# Patient Record
Sex: Female | Born: 1980 | Race: Black or African American | Hispanic: No | Marital: Single | Smoking: Current every day smoker
Health system: Southern US, Community
[De-identification: ages and names within clinical notes are randomized; demographics above are authoritative.]

## PROBLEM LIST (undated history)

## (undated) ENCOUNTER — Inpatient Hospital Stay (HOSPITAL_COMMUNITY): Payer: Self-pay

## (undated) ENCOUNTER — Ambulatory Visit (HOSPITAL_COMMUNITY): Source: Home / Self Care

## (undated) DIAGNOSIS — B9689 Other specified bacterial agents as the cause of diseases classified elsewhere: Secondary | ICD-10-CM

## (undated) DIAGNOSIS — N760 Acute vaginitis: Secondary | ICD-10-CM

## (undated) DIAGNOSIS — A549 Gonococcal infection, unspecified: Secondary | ICD-10-CM

## (undated) DIAGNOSIS — Z22322 Carrier or suspected carrier of Methicillin resistant Staphylococcus aureus: Secondary | ICD-10-CM

## (undated) DIAGNOSIS — N9489 Other specified conditions associated with female genital organs and menstrual cycle: Secondary | ICD-10-CM

## (undated) HISTORY — PX: WISDOM TOOTH EXTRACTION: SHX21

## (undated) HISTORY — DX: Carrier or suspected carrier of methicillin resistant Staphylococcus aureus: Z22.322

## (undated) HISTORY — PX: NO PAST SURGERIES: SHX2092

---
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
## 2002-01-13 ENCOUNTER — Observation Stay (HOSPITAL_COMMUNITY): Admission: AD | Admit: 2002-01-13 | Discharge: 2002-01-14 | Payer: Self-pay | Admitting: *Deleted

## 2002-02-10 ENCOUNTER — Ambulatory Visit (HOSPITAL_COMMUNITY): Admission: RE | Admit: 2002-02-10 | Discharge: 2002-02-10 | Payer: Self-pay | Admitting: *Deleted

## 2002-03-12 ENCOUNTER — Inpatient Hospital Stay (HOSPITAL_COMMUNITY): Admission: AD | Admit: 2002-03-12 | Discharge: 2002-03-12 | Payer: Self-pay | Admitting: Obstetrics and Gynecology

## 2002-03-14 ENCOUNTER — Inpatient Hospital Stay (HOSPITAL_COMMUNITY): Admission: RE | Admit: 2002-03-14 | Discharge: 2002-03-14 | Payer: Self-pay | Admitting: Obstetrics and Gynecology

## 2002-03-21 ENCOUNTER — Inpatient Hospital Stay (HOSPITAL_COMMUNITY): Admission: AD | Admit: 2002-03-21 | Discharge: 2002-03-21 | Payer: Self-pay | Admitting: Obstetrics and Gynecology

## 2002-04-06 ENCOUNTER — Inpatient Hospital Stay (HOSPITAL_COMMUNITY): Admission: AD | Admit: 2002-04-06 | Discharge: 2002-04-06 | Payer: Self-pay | Admitting: Obstetrics and Gynecology

## 2002-04-18 ENCOUNTER — Inpatient Hospital Stay (HOSPITAL_COMMUNITY): Admission: AD | Admit: 2002-04-18 | Discharge: 2002-04-18 | Payer: Self-pay | Admitting: *Deleted

## 2002-05-22 ENCOUNTER — Observation Stay (HOSPITAL_COMMUNITY): Admission: AD | Admit: 2002-05-22 | Discharge: 2002-05-23 | Payer: Self-pay | Admitting: *Deleted

## 2002-05-31 ENCOUNTER — Inpatient Hospital Stay (HOSPITAL_COMMUNITY): Admission: AD | Admit: 2002-05-31 | Discharge: 2002-05-31 | Payer: Self-pay | Admitting: *Deleted

## 2002-06-02 ENCOUNTER — Inpatient Hospital Stay (HOSPITAL_COMMUNITY): Admission: AD | Admit: 2002-06-02 | Discharge: 2002-06-02 | Payer: Self-pay | Admitting: Obstetrics and Gynecology

## 2002-06-05 ENCOUNTER — Inpatient Hospital Stay (HOSPITAL_COMMUNITY): Admission: AD | Admit: 2002-06-05 | Discharge: 2002-06-05 | Payer: Self-pay | Admitting: Obstetrics and Gynecology

## 2002-06-06 ENCOUNTER — Inpatient Hospital Stay (HOSPITAL_COMMUNITY): Admission: AD | Admit: 2002-06-06 | Discharge: 2002-06-06 | Payer: Self-pay | Admitting: *Deleted

## 2002-06-13 ENCOUNTER — Inpatient Hospital Stay (HOSPITAL_COMMUNITY): Admission: AD | Admit: 2002-06-13 | Discharge: 2002-06-13 | Payer: Self-pay | Admitting: Obstetrics and Gynecology

## 2002-06-20 ENCOUNTER — Inpatient Hospital Stay (HOSPITAL_COMMUNITY): Admission: AD | Admit: 2002-06-20 | Discharge: 2002-06-20 | Payer: Self-pay | Admitting: *Deleted

## 2002-06-21 ENCOUNTER — Inpatient Hospital Stay (HOSPITAL_COMMUNITY): Admission: AD | Admit: 2002-06-21 | Discharge: 2002-06-21 | Payer: Self-pay | Admitting: *Deleted

## 2002-06-24 ENCOUNTER — Encounter: Payer: Self-pay | Admitting: *Deleted

## 2002-06-24 ENCOUNTER — Encounter (HOSPITAL_COMMUNITY): Admission: RE | Admit: 2002-06-24 | Discharge: 2002-06-28 | Payer: Self-pay | Admitting: *Deleted

## 2002-06-27 ENCOUNTER — Inpatient Hospital Stay (HOSPITAL_COMMUNITY): Admission: AD | Admit: 2002-06-27 | Discharge: 2002-06-27 | Payer: Self-pay | Admitting: Obstetrics and Gynecology

## 2002-07-02 ENCOUNTER — Inpatient Hospital Stay (HOSPITAL_COMMUNITY): Admission: AD | Admit: 2002-07-02 | Discharge: 2002-07-02 | Payer: Self-pay | Admitting: Obstetrics and Gynecology

## 2002-07-03 ENCOUNTER — Inpatient Hospital Stay (HOSPITAL_COMMUNITY): Admission: AD | Admit: 2002-07-03 | Discharge: 2002-07-05 | Payer: Self-pay | Admitting: *Deleted

## 2003-09-18 ENCOUNTER — Emergency Department (HOSPITAL_COMMUNITY): Admission: EM | Admit: 2003-09-18 | Discharge: 2003-09-18 | Payer: Self-pay | Admitting: Emergency Medicine

## 2004-01-20 ENCOUNTER — Emergency Department (HOSPITAL_COMMUNITY): Admission: EM | Admit: 2004-01-20 | Discharge: 2004-01-20 | Payer: Self-pay | Admitting: Emergency Medicine

## 2004-06-17 ENCOUNTER — Inpatient Hospital Stay (HOSPITAL_COMMUNITY): Admission: AD | Admit: 2004-06-17 | Discharge: 2004-06-17 | Payer: Self-pay | Admitting: *Deleted

## 2005-03-26 ENCOUNTER — Inpatient Hospital Stay (HOSPITAL_COMMUNITY): Admission: AD | Admit: 2005-03-26 | Discharge: 2005-03-26 | Payer: Self-pay | Admitting: Obstetrics and Gynecology

## 2005-08-28 ENCOUNTER — Emergency Department (HOSPITAL_COMMUNITY): Admission: EM | Admit: 2005-08-28 | Discharge: 2005-08-28 | Payer: Self-pay | Admitting: Emergency Medicine

## 2006-01-19 ENCOUNTER — Inpatient Hospital Stay (HOSPITAL_COMMUNITY): Admission: AD | Admit: 2006-01-19 | Discharge: 2006-01-20 | Payer: Self-pay | Admitting: Gynecology

## 2006-01-24 ENCOUNTER — Inpatient Hospital Stay (HOSPITAL_COMMUNITY): Admission: AD | Admit: 2006-01-24 | Discharge: 2006-01-24 | Payer: Self-pay | Admitting: Gynecology

## 2006-03-02 ENCOUNTER — Inpatient Hospital Stay (HOSPITAL_COMMUNITY): Admission: AD | Admit: 2006-03-02 | Discharge: 2006-03-02 | Payer: Self-pay | Admitting: Obstetrics

## 2006-04-27 ENCOUNTER — Inpatient Hospital Stay (HOSPITAL_COMMUNITY): Admission: AD | Admit: 2006-04-27 | Discharge: 2006-04-27 | Payer: Self-pay | Admitting: Obstetrics & Gynecology

## 2006-05-05 ENCOUNTER — Inpatient Hospital Stay (HOSPITAL_COMMUNITY): Admission: AD | Admit: 2006-05-05 | Discharge: 2006-05-05 | Payer: Self-pay | Admitting: Obstetrics and Gynecology

## 2006-06-20 ENCOUNTER — Inpatient Hospital Stay (HOSPITAL_COMMUNITY): Admission: AD | Admit: 2006-06-20 | Discharge: 2006-06-20 | Payer: Self-pay | Admitting: Obstetrics & Gynecology

## 2006-07-20 ENCOUNTER — Ambulatory Visit (HOSPITAL_COMMUNITY): Admission: RE | Admit: 2006-07-20 | Discharge: 2006-07-20 | Payer: Self-pay | Admitting: Obstetrics

## 2006-09-13 ENCOUNTER — Observation Stay (HOSPITAL_COMMUNITY): Admission: AD | Admit: 2006-09-13 | Discharge: 2006-09-14 | Payer: Self-pay | Admitting: Gynecology

## 2006-09-16 ENCOUNTER — Inpatient Hospital Stay (HOSPITAL_COMMUNITY): Admission: AD | Admit: 2006-09-16 | Discharge: 2006-09-19 | Payer: Self-pay | Admitting: Obstetrics & Gynecology

## 2006-09-29 ENCOUNTER — Inpatient Hospital Stay (HOSPITAL_COMMUNITY): Admission: AD | Admit: 2006-09-29 | Discharge: 2006-10-01 | Payer: Self-pay | Admitting: Obstetrics

## 2009-04-20 ENCOUNTER — Inpatient Hospital Stay (HOSPITAL_COMMUNITY): Admission: AD | Admit: 2009-04-20 | Discharge: 2009-04-20 | Payer: Self-pay | Admitting: Obstetrics & Gynecology

## 2009-05-05 ENCOUNTER — Inpatient Hospital Stay (HOSPITAL_COMMUNITY): Admission: AD | Admit: 2009-05-05 | Discharge: 2009-05-05 | Payer: Self-pay | Admitting: Obstetrics & Gynecology

## 2009-07-16 ENCOUNTER — Ambulatory Visit (HOSPITAL_COMMUNITY): Admission: RE | Admit: 2009-07-16 | Discharge: 2009-07-16 | Payer: Self-pay | Admitting: Obstetrics

## 2009-08-09 ENCOUNTER — Inpatient Hospital Stay (HOSPITAL_COMMUNITY): Admission: AD | Admit: 2009-08-09 | Discharge: 2009-08-09 | Payer: Self-pay | Admitting: Obstetrics

## 2009-09-27 ENCOUNTER — Inpatient Hospital Stay (HOSPITAL_COMMUNITY): Admission: AD | Admit: 2009-09-27 | Discharge: 2009-09-27 | Payer: Self-pay | Admitting: Obstetrics & Gynecology

## 2009-10-14 ENCOUNTER — Ambulatory Visit: Payer: Self-pay | Admitting: Advanced Practice Midwife

## 2009-10-14 ENCOUNTER — Inpatient Hospital Stay (HOSPITAL_COMMUNITY): Admission: AD | Admit: 2009-10-14 | Discharge: 2009-10-15 | Payer: Self-pay | Admitting: Obstetrics & Gynecology

## 2009-11-07 ENCOUNTER — Inpatient Hospital Stay (HOSPITAL_COMMUNITY): Admission: AD | Admit: 2009-11-07 | Discharge: 2009-11-07 | Payer: Self-pay | Admitting: Obstetrics & Gynecology

## 2009-11-21 ENCOUNTER — Inpatient Hospital Stay (HOSPITAL_COMMUNITY): Admission: AD | Admit: 2009-11-21 | Discharge: 2009-11-21 | Payer: Self-pay | Admitting: Obstetrics

## 2009-11-29 ENCOUNTER — Observation Stay (HOSPITAL_COMMUNITY): Admission: AD | Admit: 2009-11-29 | Discharge: 2009-11-29 | Payer: Self-pay | Admitting: Obstetrics

## 2009-12-03 ENCOUNTER — Inpatient Hospital Stay (HOSPITAL_COMMUNITY): Admission: AD | Admit: 2009-12-03 | Discharge: 2009-12-05 | Payer: Self-pay | Admitting: Obstetrics & Gynecology

## 2010-09-07 ENCOUNTER — Emergency Department (HOSPITAL_COMMUNITY)
Admission: EM | Admit: 2010-09-07 | Discharge: 2010-09-07 | Payer: Self-pay | Source: Home / Self Care | Admitting: Emergency Medicine

## 2010-12-25 LAB — URINE MICROSCOPIC-ADD ON

## 2010-12-25 LAB — URINALYSIS, ROUTINE W REFLEX MICROSCOPIC
Bilirubin Urine: NEGATIVE
Glucose, UA: NEGATIVE mg/dL
Hgb urine dipstick: NEGATIVE
Ketones, ur: NEGATIVE mg/dL
Nitrite: NEGATIVE
Protein, ur: NEGATIVE mg/dL
Protein, ur: NEGATIVE mg/dL
Specific Gravity, Urine: 1.025 (ref 1.005–1.030)
Urobilinogen, UA: 0.2 mg/dL (ref 0.0–1.0)
pH: 7 (ref 5.0–8.0)
pH: 6.5 (ref 5.0–8.0)

## 2010-12-28 LAB — CBC
HCT: 27.1 % — ABNORMAL LOW (ref 36.0–46.0)
HCT: 32.7 % — ABNORMAL LOW (ref 36.0–46.0)
HCT: 32.9 % — ABNORMAL LOW (ref 36.0–46.0)
Hemoglobin: 10.9 g/dL — ABNORMAL LOW (ref 12.0–15.0)
Hemoglobin: 11.1 g/dL — ABNORMAL LOW (ref 12.0–15.0)
MCHC: 33.5 g/dL (ref 30.0–36.0)
MCV: 92.1 fL (ref 78.0–100.0)
Platelets: 143 10*3/uL — ABNORMAL LOW (ref 150–400)
Platelets: 155 10*3/uL (ref 150–400)
RDW: 12.7 % (ref 11.5–15.5)
WBC: 8.3 10*3/uL (ref 4.0–10.5)
WBC: 9.1 10*3/uL (ref 4.0–10.5)

## 2010-12-28 LAB — RPR
RPR Ser Ql: NONREACTIVE
RPR Ser Ql: NONREACTIVE

## 2011-01-09 LAB — WET PREP, GENITAL: Clue Cells Wet Prep HPF POC: NONE SEEN

## 2011-01-09 LAB — FETAL FIBRONECTIN: Fetal Fibronectin: POSITIVE — AB

## 2011-01-11 LAB — URINALYSIS, ROUTINE W REFLEX MICROSCOPIC
Glucose, UA: NEGATIVE mg/dL
Nitrite: NEGATIVE
Protein, ur: NEGATIVE mg/dL
Specific Gravity, Urine: 1.01 (ref 1.005–1.030)

## 2011-01-11 LAB — DIFFERENTIAL
Basophils Absolute: 0 10*3/uL (ref 0.0–0.1)
Eosinophils Absolute: 0.2 10*3/uL (ref 0.0–0.7)
Eosinophils Relative: 2 % (ref 0–5)

## 2011-01-11 LAB — WET PREP, GENITAL
Clue Cells Wet Prep HPF POC: NONE SEEN
Trich, Wet Prep: NONE SEEN

## 2011-01-11 LAB — CBC
Hemoglobin: 11.9 g/dL — ABNORMAL LOW (ref 12.0–15.0)
Platelets: 199 10*3/uL (ref 150–400)
RDW: 13 % (ref 11.5–15.5)

## 2011-01-15 LAB — CBC
MCV: 95.6 fL (ref 78.0–100.0)
Platelets: 210 10*3/uL (ref 150–400)
WBC: 10 10*3/uL (ref 4.0–10.5)

## 2011-01-15 LAB — URINALYSIS, ROUTINE W REFLEX MICROSCOPIC
Ketones, ur: 15 mg/dL — AB
Ketones, ur: NEGATIVE mg/dL
Leukocytes, UA: NEGATIVE
Leukocytes, UA: NEGATIVE
Nitrite: POSITIVE — AB
Nitrite: POSITIVE — AB
Protein, ur: NEGATIVE mg/dL
Specific Gravity, Urine: >1.03 — ABNORMAL HIGH (ref 1.005–1.030)
pH: 6 (ref 5.0–8.0)
pH: 6 (ref 5.0–8.0)

## 2011-01-15 LAB — WET PREP, GENITAL: Clue Cells Wet Prep HPF POC: NONE SEEN

## 2011-01-15 LAB — URINE CULTURE: Colony Count: >=100000

## 2011-01-15 LAB — HCG, QUANTITATIVE, PREGNANCY: hCG, Beta Chain, Quant, S: 69799 m[IU]/mL — ABNORMAL HIGH (ref <5)

## 2011-01-15 LAB — GC/CHLAMYDIA PROBE AMP, GENITAL
Chlamydia, DNA Probe: NEGATIVE
GC Probe Amp, Genital: NEGATIVE

## 2011-01-15 LAB — POCT PREGNANCY, URINE: Preg Test, Ur: POSITIVE

## 2011-01-15 LAB — URINE MICROSCOPIC-ADD ON

## 2011-02-24 NOTE — Discharge Summary (Signed)
Jennifer Leach, Jennifer Leach              ACCOUNT NO.:  [id number]   MEDICAL RECORD NO.:  [id number]          PATIENT TYPE:  INP   LOCATION:  9121                          [facility]  [facility]   PHYSICIAN:  Charles A. Clearance Coots, M.D.DATE OF BIRTH:  1981/08/11   DATE OF ADMISSION:  09/29/2006  DATE OF DISCHARGE:  10/01/2006                               DISCHARGE SUMMARY   ADMITTING DIAGNOSIS:  Term pregnancy in active labor.   DISCHARGE DIAGNOSES:  1. Term pregnancy in active labor.  2. Status post normal spontaneous vaginal delivery of viable female      infant on September 29, 2006, at 0938, Apgars of 9 at 1 minute and 9      at 5 minutes, weight of 2920 grams, length of 47.5 cm.  Mother and      infant discharged home in good condition   REASON FOR ADMISSION:  A 30 year old black female, G2, P70, estimated  date of confinement October 11, 2006, admitted in active labor at 4 cm  dilatation, 90% effacement and vertex at -2 station.   PAST MEDICAL HISTORY:  Surgery none.  Illnesses none.   MEDICATIONS:  Prenatal vitamins.   ALLERGIES:  No known drug allergies.   SOCIAL HISTORY:  Single.  Positive tobacco, negative alcohol or  recreational drug use.   PHYSICAL EXAMINATION:  GENERAL:  Well-nourished, well-developed female  in no acute distress.  VITAL SIGNS: She is afebrile; vital signs were stable.  LUNGS:  Clear to auscultation bilaterally.  HEART: Regular rate and rhythm.  ABDOMEN: Gravid, nontender.  PELVIC:  Cervix 4 cm dilated, 90% effacement, vertex at -2 station.   ADMITTING LABORATORY VALUES:  Hemoglobin 12.9, hematocrit 37.4, white  blood cell count 10,800, platelets 210,000   HOSPITAL COURSE:  The patient was admitted and progressed quickly to  normal spontaneous vaginal delivery of a viable infant on September 29, 2006. The patient had uterine atony post delivery with significant blood  loss and subsequent anemia. On postpartum day #1, hemoglobin was 8.5.  She was  hemodynamically stable and did not require transfusion of blood  products. She continued to do well postpartum and was, therefore,  discharged home on postpartum day #2 in good condition.   DISCHARGE LABORATORY VALUES:  Hemoglobin 8.5, hematocrit 23.8, white  blood cell count 10,800, platelets 150,000.   DISCHARGE DISPOSITION:   MEDICATIONS:  Tylox, and ibuprofen were prescribed for pain.  Continue  prenatal vitamins. Iron was prescribed for anemia. Routine written  instructions were given for discharge after vaginal delivery.  The  patient is to call office for followup appointment in 6 weeks.      Charles A. Clearance Coots, M.D.  Electronically Signed     CAH/MEDQ  D:  11/04/2006  T:  11/04/2006  Job:  [id number]

## 2011-02-24 NOTE — Discharge Summary (Signed)
NAMEMEKENZIE, Jennifer Leach              ACCOUNT NO.:  [id number]   MEDICAL RECORD NO.:  [id number]          PATIENT TYPE:  INP   LOCATION:  9158                          [facility]   PHYSICIAN:  Charles A. Clearance Coots, M.D.DATE OF BIRTH:  1981/02/12   DATE OF ADMISSION:  09/16/2006  DATE OF DISCHARGE:  09/19/2006                               DISCHARGE SUMMARY   ADMITTING DIAGNOSIS:  1. At [redacted] weeks gestation.  2. Urinary tract infection.   DISCHARGE DIAGNOSES:  1. At [redacted] weeks gestation.  2. Urinary tract infection.  3. Improved after intravenous hydration and intravenous antibiotic      therapy.   REASON FOR ADMISSION:  A 30 year old black female, estimated date of  confinement October 11, 2006, admitted and [redacted] weeks gestation with  complaint of nausea and vomiting and flank pain   PAST MEDICAL HISTORY:  Surgery: None. Illnesses: None.   MEDICATIONS:  Prenatal vitamins   ALLERGIES:  No known drug allergies.   SOCIAL HISTORY:  Single.  Positive tobacco, negative alcohol or  recreational drug use.   PHYSICAL EXAMINATION:  GENERAL:  Well-nourished, well-developed female  in no acute distress.  LUNGS:  Clear to auscultation bilaterally.  HEART: Regular rate and rhythm.  ABDOMEN:  Positive suprapubic tenderness.  PELVIC:  Exam was omitted.   LABORATORY VALUES:  Urinalysis revealed specific gravity of 1.005,  positive nitrites, large leukocyte esterase, many bacteria, few  epithelial cells. Hemoglobin of 11.6, hematocrit 33, white blood cell  count 10,000, platelets 165,000.  Comprehensive metabolic panel was  within normal limits.   HOSPITAL COURSE:  The patient was admitted, started on IV antibiotic  therapy. Responded well to therapy and discharged home on hospital day  #3 with no pain. Urine culture was negative.   DISCHARGE DISPOSITION:   MEDICATIONS:  1. Ceftin 500 mg twice a day 7 days.  2. Continue prenatal vitamins.   The patient is to call office for followup  appointment in 2 weeks.      Charles A. Clearance Coots, M.D.  Electronically Signed     CAH/MEDQ  D:  11/04/2006  T:  11/04/2006  Job:  [id number]

## 2011-03-29 ENCOUNTER — Emergency Department (HOSPITAL_COMMUNITY)
Admission: EM | Admit: 2011-03-29 | Discharge: 2011-03-29 | Disposition: A | Discharge status: Home / Self Care | Payer: Self-pay | Attending: Emergency Medicine | Admitting: Emergency Medicine

## 2011-03-29 DIAGNOSIS — R1110 Vomiting, unspecified: Secondary | ICD-10-CM | POA: Insufficient documentation

## 2011-03-29 DIAGNOSIS — R059 Cough, unspecified: Secondary | ICD-10-CM | POA: Insufficient documentation

## 2011-03-29 DIAGNOSIS — J029 Acute pharyngitis, unspecified: Secondary | ICD-10-CM | POA: Insufficient documentation

## 2011-03-29 DIAGNOSIS — R05 Cough: Secondary | ICD-10-CM | POA: Insufficient documentation

## 2011-03-29 DIAGNOSIS — R51 Headache: Secondary | ICD-10-CM | POA: Insufficient documentation

## 2011-03-29 DIAGNOSIS — R42 Dizziness and giddiness: Secondary | ICD-10-CM | POA: Insufficient documentation

## 2011-03-29 DIAGNOSIS — R112 Nausea with vomiting, unspecified: Secondary | ICD-10-CM | POA: Insufficient documentation

## 2011-03-29 DIAGNOSIS — IMO0001 Reserved for inherently not codable concepts without codable children: Secondary | ICD-10-CM | POA: Insufficient documentation

## 2011-03-29 DIAGNOSIS — R599 Enlarged lymph nodes, unspecified: Secondary | ICD-10-CM | POA: Insufficient documentation

## 2011-03-29 LAB — DIFFERENTIAL
Basophils Relative: 0 % (ref 0–1)
Lymphs Abs: 3.1 10*3/uL (ref 0.7–4.0)
Monocytes Relative: 7 % (ref 3–12)
Neutro Abs: 4.5 10*3/uL (ref 1.7–7.7)
Neutrophils Relative %: 54 % (ref 43–77)

## 2011-03-29 LAB — POCT I-STAT, CHEM 8
BUN: 12 mg/dL (ref 6–23)
Chloride: 107 mEq/L (ref 96–112)
Potassium: 3.9 mEq/L (ref 3.5–5.1)
Sodium: 140 mEq/L (ref 135–145)

## 2011-03-29 LAB — URINALYSIS, ROUTINE W REFLEX MICROSCOPIC
Hgb urine dipstick: NEGATIVE
Leukocytes, UA: NEGATIVE
Specific Gravity, Urine: 1.025 (ref 1.005–1.030)
Urobilinogen, UA: 1 mg/dL (ref 0.0–1.0)

## 2011-03-29 LAB — RAPID STREP SCREEN (MED CTR MEBANE ONLY): Streptococcus, Group A Screen (Direct): NEGATIVE

## 2011-03-29 LAB — CBC
Hemoglobin: 12.5 g/dL (ref 12.0–15.0)
MCH: 31.6 pg (ref 26.0–34.0)
MCV: 88.9 fL (ref 78.0–100.0)
RBC: 3.95 MIL/uL (ref 3.87–5.11)

## 2012-06-03 ENCOUNTER — Encounter (HOSPITAL_COMMUNITY): Payer: Self-pay | Admitting: Family

## 2012-06-03 ENCOUNTER — Inpatient Hospital Stay (HOSPITAL_COMMUNITY)
Admission: AD | Admit: 2012-06-03 | Discharge: 2012-06-03 | Disposition: A | Discharge status: Home / Self Care | Payer: Self-pay | Source: Ambulatory Visit | Attending: Obstetrics & Gynecology | Admitting: Obstetrics & Gynecology

## 2012-06-03 DIAGNOSIS — B9689 Other specified bacterial agents as the cause of diseases classified elsewhere: Secondary | ICD-10-CM | POA: Insufficient documentation

## 2012-06-03 DIAGNOSIS — N760 Acute vaginitis: Secondary | ICD-10-CM | POA: Insufficient documentation

## 2012-06-03 DIAGNOSIS — R109 Unspecified abdominal pain: Secondary | ICD-10-CM | POA: Insufficient documentation

## 2012-06-03 DIAGNOSIS — A499 Bacterial infection, unspecified: Secondary | ICD-10-CM | POA: Insufficient documentation

## 2012-06-03 LAB — URINALYSIS, ROUTINE W REFLEX MICROSCOPIC
Glucose, UA: NEGATIVE mg/dL
Leukocytes, UA: NEGATIVE
Nitrite: NEGATIVE
Protein, ur: NEGATIVE mg/dL
Urobilinogen, UA: 1 mg/dL (ref 0.0–1.0)

## 2012-06-03 LAB — WET PREP, GENITAL

## 2012-06-03 LAB — POCT PREGNANCY, URINE: Preg Test, Ur: NEGATIVE

## 2012-06-03 MED ORDER — METRONIDAZOLE 500 MG PO TABS: 500.0000 mg | ORAL_TABLET | Freq: Two times a day (BID) | ORAL | Status: AC

## 2012-06-03 MED ORDER — NORGESTIMATE-ETH ESTRADIOL 0.25-35 MG-MCG PO TABS: 1.0000 | ORAL_TABLET | Freq: Every day | ORAL | Status: DC

## 2012-06-03 NOTE — MAU Note (Signed)
Pt states lower stomach and lower back pain, heavy white vaginal discharge and vaginal itching and burning. Pt states stomach pain since Saturday morning, with discharge increasing yesterday.

## 2012-06-03 NOTE — MAU Provider Note (Signed)
History     CSN: [id number]  Arrival date and time: 06/03/12 1557   First Provider Initiated Contact with Patient 06/03/12 1802      Chief Complaint  Patient presents with  . Abdominal Pain   HPI Pt is not pregnant and presents with lower abdominal central pain; the pain is like something pulling, stretching.  The pain is constant.  She has pain in her back that comes and goes, which is not new for this patient.  Pt is not using anything for birth control- plans to get Depo at [facility] in Sept.  Pt complains of thick white vaginal discharge with itching externally.  She denies pain with urination, constipation or diarrhea.  Past Medical History  Diagnosis Date  . No pertinent past medical history     History reviewed. No pertinent past surgical history.  No family history on file.  History  Substance Use Topics  . Smoking status: Former Smoker -- 9 years    Quit date: 04/12/2012  . Smokeless tobacco: Not on file  . Alcohol Use: No    Allergies: No Known Allergies  No prescriptions prior to admission    Review of Systems  Constitutional: Negative for fever and chills.  Gastrointestinal: Positive for abdominal pain. Negative for nausea, vomiting, diarrhea and constipation.  Genitourinary: Negative for dysuria, urgency and frequency.  Skin: Negative for rash.  Neurological: Negative for headaches.   Physical Exam   Blood pressure 114/79, pulse 63, temperature 97.9 F (36.6 C), temperature source Oral, resp. rate 18, height 5\' 6"  (1.676 m), weight 64.864 kg (143 lb), last menstrual period 05/11/2012.  Physical Exam  Vitals reviewed. Constitutional: She is oriented to person, place, and time. She appears well-developed and well-nourished.  HENT:  Head: Normocephalic.  Eyes: Pupils are equal, round, and reactive to light.  Neck: Normal range of motion. Neck supple.  Cardiovascular: Normal rate.   Respiratory: Effort normal.  GI: Soft. She exhibits no  distension. There is no tenderness. There is no rebound and no guarding.  Genitourinary:       Mod amount of frothy white discharge in vault; cervix clean NT; uterus NSSC NT; adnexa without palpable enlargement  Musculoskeletal: Normal range of motion.  Neurological: She is alert and oriented to person, place, and time.  Skin: Skin is warm and dry.  Psychiatric: She has a normal mood and affect.    MAU Course  Procedures Results for orders placed during the hospital encounter of 06/03/12 (from the past 24 hour(s))  URINALYSIS, ROUTINE W REFLEX MICROSCOPIC     Status: Abnormal   Collection Time   06/03/12  4:20 PM      Component Value Range   Color, Urine YELLOW  YELLOW   APPearance HAZY (*) CLEAR   Specific Gravity, Urine >1.030 (*) 1.005 - 1.030   pH 6.0  5.0 - 8.0   Glucose, UA NEGATIVE  NEGATIVE mg/dL   Hgb urine dipstick NEGATIVE  NEGATIVE   Bilirubin Urine NEGATIVE  NEGATIVE   Ketones, ur NEGATIVE  NEGATIVE mg/dL   Protein, ur NEGATIVE  NEGATIVE mg/dL   Urobilinogen, UA 1.0  0.0 - 1.0 mg/dL   Nitrite NEGATIVE  NEGATIVE   Leukocytes, UA NEGATIVE  NEGATIVE  POCT PREGNANCY, URINE     Status: Normal   Collection Time   06/03/12  4:30 PM      Component Value Range   Preg Test, Ur NEGATIVE  NEGATIVE  WET PREP, GENITAL     Status: Abnormal  Collection Time   06/03/12  6:08 PM      Component Value Range   Yeast Wet Prep HPF POC NONE SEEN  NONE SEEN   Trich, Wet Prep NONE SEEN  NONE SEEN   Clue Cells Wet Prep HPF POC MODERATE (*) NONE SEEN   WBC, Wet Prep HPF POC FEW (*) NONE SEEN    Assessment and Plan  Bacterial vaginosis- flagyl 500 mg BID for 7 days Contraception- prescription for Sprintec given until pt can get Depo- start today  LINEBERRY,SUSAN 06/03/2012, 6:02 PM

## 2012-06-03 NOTE — MAU Note (Signed)
Cramping in lower stomach, started om Sat. 4 wks ago had an abortion (procedure).  D/c is thick.  No pain with urination.

## 2012-07-03 ENCOUNTER — Emergency Department (HOSPITAL_COMMUNITY)
Admission: EM | Admit: 2012-07-03 | Discharge: 2012-07-03 | Disposition: A | Discharge status: Home / Self Care | Payer: Self-pay | Attending: Emergency Medicine | Admitting: Emergency Medicine

## 2012-07-03 ENCOUNTER — Encounter (HOSPITAL_COMMUNITY): Payer: Self-pay | Admitting: Emergency Medicine

## 2012-07-03 DIAGNOSIS — J3489 Other specified disorders of nose and nasal sinuses: Secondary | ICD-10-CM | POA: Insufficient documentation

## 2012-07-03 DIAGNOSIS — Z87891 Personal history of nicotine dependence: Secondary | ICD-10-CM | POA: Insufficient documentation

## 2012-07-03 DIAGNOSIS — IMO0001 Reserved for inherently not codable concepts without codable children: Secondary | ICD-10-CM | POA: Insufficient documentation

## 2012-07-03 DIAGNOSIS — B349 Viral infection, unspecified: Secondary | ICD-10-CM

## 2012-07-03 DIAGNOSIS — R51 Headache: Secondary | ICD-10-CM | POA: Insufficient documentation

## 2012-07-03 MED ORDER — IBUPROFEN 400 MG PO TABS
800.0000 mg | ORAL_TABLET | Freq: Once | ORAL | Status: AC
  Administered 2012-07-03: 800 mg via ORAL
  Filled 2012-07-03: qty 2

## 2012-07-03 NOTE — ED Provider Notes (Signed)
History  This chart was scribed for Jennifer Octave, MD by Ardeen Jourdain. This patient was seen in room TR02C/TR02C and the patient's care was started at 1202.   CSN: [id number]  Arrival date & time 07/03/12  1132   First MD Initiated Contact with Patient 07/03/12 1202      Chief Complaint  Patient presents with  . URI     The history is provided by the patient. No language interpreter was used.    Jennifer Leach is a 31 y.o. female who presents to the Emergency Department complaining of constant, worsening sore throat with associated congestion, HA, body aches, weakness, dizziness, nausea and lowered appetite. She states the cold like symptoms started 3 days ago. She denies fever, neck pain, visual disturbance, CP, cough, SOB, abdominal pain, emesis, diarrhea, urinary symptoms, back pain, numbness and rash as associated symptoms. She denies any known sick contacts. She does not have a h/o chronic medical conditions. She is a former smoker but denies alcohol use.     Past Medical History  Diagnosis Date  . No pertinent past medical history     No past surgical history on file.  No family history on file.  History  Substance Use Topics  . Smoking status: Former Smoker -- 0.2 packs/day for 9 years    Quit date: 04/12/2012  . Smokeless tobacco: Not on file  . Alcohol Use: No    OB History    Grav Para Term Preterm Abortions TAB SAB Ect Mult Living   4 3 3  1 1    3       Review of Systems  A complete 10 system review of systems was obtained and all systems are negative except as noted in the HPI and PMH.    Allergies  Review of patient's allergies indicates no known allergies.  Home Medications   Current Outpatient Rx  Name Route Sig Dispense Refill  . ADULT ONE DAILY GUMMIES PO Oral Take 1 tablet by mouth daily.      Triage Vitals: BP 117/89  Pulse 65  Temp 97.9 F (36.6 C) (Oral)  Resp 18  SpO2 100%  Breastfeeding? Unknown  Physical Exam    Nursing note and vitals reviewed. Constitutional: She is oriented to person, place, and time. She appears well-developed and well-nourished. No distress.  HENT:  Head: Normocephalic and atraumatic.  Mouth/Throat: Oropharynx is clear and moist.       cerumen impacted in left ear, no sinus tenderness, no meningismus,    Eyes: EOM are normal. Pupils are equal, round, and reactive to light.  Neck: Normal range of motion. Neck supple. No tracheal deviation present.  Cardiovascular: Normal rate, regular rhythm and normal heart sounds.   Pulmonary/Chest: Effort normal and breath sounds normal. No respiratory distress. She has no wheezes.  Abdominal: Soft.  Musculoskeletal: Normal range of motion.  Lymphadenopathy:    She has no cervical adenopathy.  Neurological: She is alert and oriented to person, place, and time.  Skin: Skin is warm and dry.  Psychiatric: She has a normal mood and affect. Her behavior is normal.    ED Course  Procedures (including critical care time)  DIAGNOSTIC STUDIES: Oxygen Saturation is 100% on room air, normal by my interpretation.    COORDINATION OF CARE:  1215- Discussed treatment plan with pt at bedside and pt agreed to plan.   Labs Reviewed - No data to display No results found.   No diagnosis found.    MDM  2 Days of congestion, body aches, headache, sore throat. No fever, chills, chest pain or shortness of breath. No cough. No sick contacts. No camping or tick exposures.   Nontoxic appearing, no meningismus, sinuses nontender, throat clear, lungs clear.  Treat for viral syndrome.   I personally performed the services described in this documentation, which was scribed in my presence.  The recorded information has been reviewed and considered.   Jennifer Octave, MD 07/03/12 1302

## 2012-07-03 NOTE — ED Notes (Signed)
Fever weak and feeling drained  X 3 days

## 2012-07-03 NOTE — ED Notes (Signed)
Pt d/c home in NAD. Pt voiced understanding of d/c instructions and follow up care.  

## 2012-07-03 NOTE — ED Notes (Signed)
Nose stopped up and sorethroat

## 2012-07-03 NOTE — Discharge Instructions (Signed)
Viral Syndrome You or your child has Viral Syndrome. It is the most common infection causing "colds" and infections in the nose, throat, sinuses, and breathing tubes. Sometimes the infection causes nausea, vomiting, or diarrhea. The germ that causes the infection is a virus. No antibiotic or other medicine will kill it. There are medicines that you can take to make you or your child more comfortable.  HOME CARE INSTRUCTIONS   Rest in bed until you start to feel better.   If you have diarrhea or vomiting, eat small amounts of crackers and toast. Soup is helpful.   Do not give aspirin or medicine that contains aspirin to children.   Only take over-the-counter or prescription medicines for pain, discomfort, or fever as directed by your caregiver.  SEEK IMMEDIATE MEDICAL CARE IF:   You or your child has not improved within one week.   You or your child has pain that is not at least partially relieved by over-the-counter medicine.   Thick, colored mucus or blood is coughed up.   Discharge from the nose becomes thick yellow or green.   Diarrhea or vomiting gets worse.   There is any major change in your or your child's condition.   You or your child develops a skin rash, stiff neck, severe headache, or are unable to hold down food or fluid.   You or your child has an oral temperature above 102 F (38.9 C), not controlled by medicine.   Your baby is older than 3 months with a rectal temperature of 102 F (38.9 C) or higher.   Your baby is 6 months old or younger with a rectal temperature of 100.4 F (38 C) or higher.  Document Released: 09/10/2006 Document Revised: 09/14/2011 Document Reviewed: 09/11/2007 [facility] Patient Information 2012 [place], [place]. RESOURCE GUIDE  Chronic Pain Problems: Contact [facility]  [phone] Patients need to be referred by their primary care doctor.  Insufficient Money for Medicine: Contact United Way:  call "211" or [facility] [phone].  No Primary Care Doctor: - Call [facility] Connect  [phone] - can help you locate a primary care doctor that  accepts your insurance, provides certain services, etc. - Physician Referral [phone]  Agencies that provide inexpensive medical care: - [facility]  [phone] - [facility]  [phone] - [facility]  [phone] [facility]  [phone] - [facility] Parenthood  [phone] [facility]  [phone]  Medicaid-accepting [facility] Providers: - [facility]- [address]  [phone], Mon-Fri 9am-7pm, Sat 9am-1pm - [facility]- [address] [place], Suite [place]  [phone] - [facility]- [address] [place]  [phone] [facility]- [address]  [phone] - Renaye Rakers- [address] [place], [address], [phone]  Only accepts [place] patients after they have their name  applied to their card  Self Pay (no insurance) in [place]: - Sickle Cell Patients: Dr Willey Blade, [facility]  [address] [place], [phone] - [facility] Care- [address] [place]  [phone]       [facility] [place]- [address]       -     [facility]- see information above (Speak to [employer] if you do not have insurance)       -  [facility]- [address], [phone]       -  [facility]- [address],  [phone]       -  [facility]- [address] [phone]       -  Dr Julio Sicks-  [address], [place], [phone]       -  [facility]- [address] [phone]       -  [facility]- [address] [phone], also [address], [phone]       -    [facility]- [address] [place], [phone], 1st & 3rd Saturday   every month,  10am-1pm  1) Find a Doctor and Pay Out of Pocket Although you won't have to find out who is covered by your insurance plan, it is a good idea to ask around and get recommendations. You will then need to call the office and see if the doctor you have chosen will accept you as a new patient and what types of options they offer for patients who are self-pay. Some doctors offer discounts or will set up payment plans for their patients who do not have insurance, but you will need to ask so you aren't surprised when you get to your appointment.  2) Contact Your Local Health Department Not all health departments have doctors that can see patients for sick visits, but many do, so it is worth a call to see if yours does. If you don't know where your local health department is, you can check in your phone book. The CDC also has a tool to help you locate your state's health department, and many state websites also have listings of all of their local health departments.  3) Find a Walk-in Clinic If your illness is not likely to be very severe or complicated, you may want to try a walk in clinic. These are popping up all over the country in pharmacies, drugstores, and shopping centers. They're usually staffed by nurse practitioners or physician assistants that have been trained to treat common illnesses and complaints. They're usually fairly quick and inexpensive. However, if you have serious medical issues or chronic medical problems, these are probably not your best option  STD Testing - [facility] Department of [facility], STD Clinic, [address] [place], phone [phone] or [phone].  Monday - Friday, call for an appointment. [facility] Department of [employer], STD Clinic, [address], [place], phone [phone] or [phone].  Monday - Friday, call for an appointment.  Abuse/Neglect: [facility] Child Abuse Hotline 919-057-5874 [facility] Child Abuse Hotline 438-643-4216 (After Hours)  Emergency Shelter:  [facility] 709-139-8138  Maternity Homes: - [facility] (779)714-1123 - [facility] [phone]  MRSA Hotline #:   214-048-2358  [facility] Resources  [facility] [place]  United Way [facility] Dept. [address]                 [address]         [address] [place] [address]  [address]  Cristobal Goldmann Phone:  [phone]                                  Phone:  [phone]                   Phone:  [phone]  [facility], [phone] - [phone]       -     [place] [facility] in [place], [address]                                  [phone]) [phone], [facility] Child Abuse Hotline (657)508-4533 or (684)523-1094 (After Hours)   Behavioral Health Services  Substance Abuse Resources: - Alcohol and Drug Services  646-238-8538 - [facility] [phone] - [facility] [place] [phone] Floydene Flock [phone] - Residential & Outpatient Substance Abuse Program  ([phone]  Psychological Services: [facility]  [phone] [facility] Services  [phone] - [facility], [phone] [place]. [address] [place], ACCESS LINE: [phone] or [phone], [employer].za  Dental Assistance  If unable to pay or uninsured, contact:  [facility] or [facility]. to become qualified for the adult dental clinic.  Patients with Medicaid: [facility] [phone] [address], [phone] [address] [phone]  If unable to pay, or uninsured, contact [facility] (214) [phone]) or [facility]  Department [phone] in [place], [phone] in [facility]) to become qualified for the adult dental clinic  Other Low-Cost Community Dental Services: - [facility]- [address] [place], [place], [place], [address], [phone], [address]. [phone], 2nd and 4th Thursday of the month at 6:30am.  10 clients each day by appointment, can sometimes see walk-in patients if someone does not show for an appointment. [facility]- [address] [place], [place], [address], [phone] - [facility]- [address], [place], [place], [address], [phone] - [facility]- [phone] [facility] Department- [phone] [facility] Department- [phone]

## 2012-07-03 NOTE — ED Notes (Signed)
Pt states "My head hurts, my nose stopped up, I feel dizzy, and I'm having heat flashes." Denies sore throat or fever.

## 2012-07-20 ENCOUNTER — Emergency Department (HOSPITAL_COMMUNITY): Payer: Self-pay

## 2012-07-20 ENCOUNTER — Observation Stay (HOSPITAL_COMMUNITY)
Admission: EM | Admit: 2012-07-20 | Discharge: 2012-07-21 | Disposition: A | Discharge status: Home / Self Care | Payer: Self-pay | Attending: Emergency Medicine | Admitting: Emergency Medicine

## 2012-07-20 ENCOUNTER — Encounter (HOSPITAL_COMMUNITY): Payer: Self-pay | Admitting: Emergency Medicine

## 2012-07-20 DIAGNOSIS — R05 Cough: Secondary | ICD-10-CM | POA: Insufficient documentation

## 2012-07-20 DIAGNOSIS — J029 Acute pharyngitis, unspecified: Principal | ICD-10-CM | POA: Insufficient documentation

## 2012-07-20 DIAGNOSIS — R110 Nausea: Secondary | ICD-10-CM | POA: Insufficient documentation

## 2012-07-20 DIAGNOSIS — R6883 Chills (without fever): Secondary | ICD-10-CM | POA: Insufficient documentation

## 2012-07-20 DIAGNOSIS — R599 Enlarged lymph nodes, unspecified: Secondary | ICD-10-CM | POA: Insufficient documentation

## 2012-07-20 DIAGNOSIS — R059 Cough, unspecified: Secondary | ICD-10-CM | POA: Insufficient documentation

## 2012-07-20 DIAGNOSIS — R079 Chest pain, unspecified: Secondary | ICD-10-CM | POA: Insufficient documentation

## 2012-07-20 LAB — POCT PREGNANCY, URINE: Preg Test, Ur: NEGATIVE

## 2012-07-20 LAB — CBC WITH DIFFERENTIAL/PLATELET
Basophils Absolute: 0 10*3/uL (ref 0.0–0.1)
Basophils Relative: 0 % (ref 0–1)
MCHC: 34.8 g/dL (ref 30.0–36.0)
Neutro Abs: 4.8 10*3/uL (ref 1.7–7.7)
Neutrophils Relative %: 73 % (ref 43–77)
Platelets: 183 10*3/uL (ref 150–400)
RDW: 12.6 % (ref 11.5–15.5)

## 2012-07-20 LAB — BASIC METABOLIC PANEL
BUN: 9 mg/dL (ref 6–23)
Calcium: 8.9 mg/dL (ref 8.4–10.5)
Creatinine, Ser: 0.68 mg/dL (ref 0.50–1.10)
GFR calc Af Amer: >90 mL/min (ref >90)
GFR calc non Af Amer: >90 mL/min (ref >90)
Potassium: 3.9 mEq/L (ref 3.5–5.1)

## 2012-07-20 MED ORDER — ASPIRIN 81 MG PO CHEW
162.0000 mg | CHEWABLE_TABLET | Freq: Once | ORAL | Status: AC
  Administered 2012-07-20: 162 mg via ORAL
  Filled 2012-07-20: qty 2

## 2012-07-20 MED ORDER — OXYCODONE-ACETAMINOPHEN 5-325 MG PO TABS
2.0000 | ORAL_TABLET | Freq: Once | ORAL | Status: AC
  Administered 2012-07-20: 2 via ORAL
  Filled 2012-07-20: qty 2

## 2012-07-20 MED ORDER — AMOXICILLIN 500 MG PO CAPS
500.0000 mg | ORAL_CAPSULE | Freq: Once | ORAL | Status: AC
  Administered 2012-07-20: 500 mg via ORAL
  Filled 2012-07-20: qty 1

## 2012-07-20 MED ORDER — DEXAMETHASONE SODIUM PHOSPHATE 10 MG/ML IJ SOLN: 10.0000 mg | Freq: Once | INTRAMUSCULAR | Status: DC

## 2012-07-20 MED ORDER — DEXAMETHASONE SODIUM PHOSPHATE 10 MG/ML IJ SOLN
10.0000 mg | Freq: Once | INTRAMUSCULAR | Status: AC
  Administered 2012-07-20: 10 mg via INTRAMUSCULAR
  Filled 2012-07-20: qty 1

## 2012-07-20 NOTE — ED Notes (Signed)
Pt. Stated, I feel like I have a ball of fire in my throat

## 2012-07-20 NOTE — ED Notes (Signed)
Cont to c/o mid chest pain, states it is sore. Provider at bedside and aware

## 2012-07-20 NOTE — ED Provider Notes (Signed)
2000 Report received from Dr. Rhunette Croft for this 31yo female c/o sore throat today with cough and intermitant chest pain x 3 days.  Patient has T wave inversion in V1 t V3. First troponin negative. Patient will receive 500 amoxicillin in the a.m. for her sore throat. She should be discharged with a prescription for amoxicillin as well. First trip test is pending. Her son has strep. She is on the chest pain protocol in the CDU and will get a stress test in the a.m. Her BMI is 23.4. Chest x-ray is unremarkable I reviewed this myself.   2200 strep test is negative. Patient states that she is having some chest pressure or 6/10. Will give some Percocet to see if the pain is resolves. No shortness of breath. Patient was asleep when I entered the room.  2356  Pain is 0/10 presently.  Patient needs dose of amixicillin in am with rx for the same at discharge per Dr. Rhunette Croft.  Unable to do stress test as requested on the weekend.  Will change the order to CT heart.      Remi Haggard, NP 07/21/12 [phone]

## 2012-07-20 NOTE — ED Notes (Signed)
POCT pregenancy test came back negative.

## 2012-07-20 NOTE — ED Notes (Signed)
o2 applied at 2lpm via . Pt complains of mid upper chest pain x 3-4 days. States pain is at a 6

## 2012-07-20 NOTE — ED Provider Notes (Addendum)
History    This chart was scribed for Derwood Kaplan, MD, MD by Smitty Pluck. The patient was seen in room TR07C and the patient's care was started at 5:09PM.   CSN: [id number]  Arrival date & time 07/20/12  1430   None     Chief Complaint  Patient presents with  . Sore Throat    (Consider location/radiation/quality/duration/timing/severity/associated sxs/prior treatment) The history is provided by the patient. No language interpreter was used.   Jennifer Leach is a 31 y.o. female who presents to the Emergency Department complaining of constant, moderate sore throat onset 1 day ago. Pt reports having productive cough, nausea and chills. Denies changes in voice and difficulty swallowing. Pt reports that she has had sick contact with son (strep throat). Pain is aggravated by speaking. Denies any other pain. She reports intermittent, central chest pressure onset 3 days ago. Denies hx of cardiac complications, blood clots in legs and lungs, long travel, birth control usage, aggravating and relieving factors. LMP was 1 month ago. She is unsure if she is pregnant.   Past Medical History  Diagnosis Date  . No pertinent past medical history     History reviewed. No pertinent past surgical history.  No family history on file.  History  Substance Use Topics  . Smoking status: Former Smoker -- 0.2 packs/day for 9 years    Quit date: 04/12/2012  . Smokeless tobacco: Not on file  . Alcohol Use: No    OB History    Grav Para Term Preterm Abortions TAB SAB Ect Mult Living   4 3 3  1 1    3       Review of Systems  HENT: Positive for sore throat.   Respiratory: Positive for cough.   Cardiovascular: Positive for chest pain.  Gastrointestinal: Positive for nausea.  All other systems reviewed and are negative.    Allergies  Review of patient's allergies indicates no known allergies.  Home Medications  No current outpatient prescriptions on file.  BP 118/78  Pulse 68  Temp  98.4 F (36.9 C) (Oral)  Resp 20  SpO2 97%  LMP 06/11/2012  Physical Exam  Nursing note and vitals reviewed. Constitutional: She is oriented to person, place, and time. She appears well-developed and well-nourished. No distress.  HENT:  Head: Normocephalic and atraumatic.  Mouth/Throat: No oropharyngeal exudate.       Petechia over palate with mild erythema and swelling   Eyes: Conjunctivae normal are normal.  Neck: Normal range of motion. Neck supple.  Pulmonary/Chest: Effort normal. No respiratory distress.  Musculoskeletal: Normal range of motion.  Lymphadenopathy:    She has cervical adenopathy (anterior).  Neurological: She is alert and oriented to person, place, and time.  Skin: Skin is warm and dry.  Psychiatric: She has a normal mood and affect. Her behavior is normal.    ED Course  Procedures (including critical care time) DIAGNOSTIC STUDIES: Oxygen Saturation is 97% on room air, normal by my interpretation.    COORDINATION OF CARE: 5:12 PM Discussed ED treatment with pt      Labs Reviewed  RAPID STREP SCREEN   No results found.   No diagnosis found.    MDM  Medical screening examination/treatment/procedure(s) were performed by me as the supervising physician. Scribe service was utilized for documentation only.  DDX includes: Viral syndrome Influenza Pharyngitis Sinusitis  Pt comes in with cc of sore throat. Has a son who was diagnosed with strep, and she has petechiae in the  palate with anterior lymph node swelling, so we will treat her as if she has a strep infection.  Pt also complains of some chest pressure x 3 days, constant, no pleuritic, exertional or ms component to it. She has 0 cardiac risk factors, and 0 on wells score with no risk factors for dvt, pe. She is PERC negative.  Will get troponin x 1, and ekg.  Derwood Kaplan, MD 07/20/12 1943   Date: 07/20/2012  Rate: 57  Rhythm: normal sinus rhythm  QRS Axis: normal  Intervals:  normal  ST/T Wave abnormalities: nonspecific T wave changes  Conduction Disutrbances:none  Narrative Interpretation:   Old EKG Reviewed: none available  Pt has chest pressure with ekg changes. Will admit to CDU for chest pain protocol. BMI is normal, and is healthy enough to run as well.      Derwood Kaplan, MD 07/20/12 1951

## 2012-07-21 ENCOUNTER — Observation Stay (HOSPITAL_COMMUNITY): Payer: Self-pay

## 2012-07-21 MED ORDER — METOPROLOL TARTRATE 1 MG/ML IV SOLN
5.0000 mg | Freq: Once | INTRAVENOUS | Status: AC
  Administered 2012-07-21: 5 mg via INTRAVENOUS
  Filled 2012-07-21: qty 5

## 2012-07-21 MED ORDER — ACETAMINOPHEN 325 MG PO TABS
650.0000 mg | ORAL_TABLET | Freq: Once | ORAL | Status: AC
  Administered 2012-07-21: 650 mg via ORAL
  Filled 2012-07-21: qty 2

## 2012-07-21 MED ORDER — NITROGLYCERIN 0.4 MG SL SUBL
0.4000 mg | SUBLINGUAL_TABLET | Freq: Once | SUBLINGUAL | Status: AC
  Administered 2012-07-21: 0.4 mg via SUBLINGUAL

## 2012-07-21 MED ORDER — METOPROLOL TARTRATE 25 MG PO TABS: 50.0000 mg | ORAL_TABLET | Freq: Once | ORAL | Status: DC

## 2012-07-21 MED ORDER — AMOXICILLIN 500 MG PO CAPS
500.0000 mg | ORAL_CAPSULE | Freq: Once | ORAL | Status: AC
Start: 2012-07-21 — End: 2012-07-21
  Administered 2012-07-21: 500 mg via ORAL
  Filled 2012-07-21: qty 1

## 2012-07-21 MED ORDER — METOPROLOL TARTRATE 25 MG PO TABS
50.0000 mg | ORAL_TABLET | Freq: Once | ORAL | Status: AC
  Administered 2012-07-21: 50 mg via ORAL
  Filled 2012-07-21: qty 2

## 2012-07-21 MED ORDER — AMOXICILLIN 875 MG PO TABS: 875.0000 mg | ORAL_TABLET | Freq: Two times a day (BID) | ORAL | Status: DC

## 2012-07-21 MED ORDER — IOHEXOL 350 MG/ML SOLN
80.0000 mL | Freq: Once | INTRAVENOUS | Status: AC | PRN
  Administered 2012-07-21: 80 mL via INTRAVENOUS

## 2012-07-21 NOTE — ED Notes (Signed)
istat troponin 0.00

## 2012-07-21 NOTE — ED Provider Notes (Signed)
Medical screening examination/treatment/procedure(s) were performed by non-physician practitioner and as supervising physician I was immediately available for consultation/collaboration.  Rochel Privett, MD 07/21/12 1255 

## 2012-07-21 NOTE — ED Notes (Signed)
ISTAT Troponin 0.00

## 2012-07-21 NOTE — ED Notes (Signed)
Norm Parcel., PA at pt bedside.

## 2012-07-21 NOTE — ED Provider Notes (Signed)
Medical screening examination/treatment/procedure(s) were performed by non-physician practitioner and as supervising physician I was immediately available for consultation/collaboration.   Roselie Cirigliano B. Lunabella Badgett, MD 07/21/12 1449 

## 2012-07-21 NOTE — ED Provider Notes (Signed)
7:00 AM Assumed care of patient in the CDU.  Patient is currently on Chest Pain Protocol.  Plan is for the patient to have a Coronary CT this morning.  Patient presented last evening with chest pain that has been intermittent over the past 3 days.  No cardiac risk factors.  EKG with non specific t wave changes.  Initial, 3 hour, and 6 hour troponin negative.  No acute findings on CXR.  Patient also presented with a sore throat and has had recent exposure to strep throat.  Plan is for the patient to have one dose of Amoxicillin 500 mg this morning and discharge home with prescription for Amoxicillin.  Reassessed patient.  She denies any chest pain at this time.  Patient alert and orientated x 3, throat mildly erythematous, no drooling or difficulty swallowing, uvula midline.  Heart RRR, Lungs CTAB, left anterior chest wall mildly tender to palpation.    10:22 AM Coronary CT results are as follows: IMPRESSION:  1. No evidence of significant coronary artery atherosclerosis. The  patient's total coronary artery calcium score is 0.  2. 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 These findings are of no  clinical significance at this time, but the position of this left  anterior descending coronary artery should be noted if the patient  should require a right ventricular pacemaker lead placement at any  point in the future.  3. No acute findings in the visualized thorax to account for the  patient's symptoms.  4. Codominance of the coronary arteries.   Discussed results with the patient.  Feel that patient can be discharged home.  Patient will be given a prescription for Amoxicillin to treat strep throat.  Patient in no acute distress.   Patient alert and orientated x 3, Heart RRR, Lungs CTAB.  Return precautions discussed with patient.   Patient in agreement with the  plan.  Pascal Lux [place], PA-C 07/21/12 1052

## 2012-07-21 NOTE — Progress Notes (Signed)
Utilization review completed.  

## 2012-07-21 NOTE — ED Notes (Signed)
Pt assisted to restroom via wheelchair by tech at this time.

## 2012-07-21 NOTE — ED Notes (Signed)
BMI 23.4

## 2012-07-22 LAB — POCT I-STAT TROPONIN I: Troponin i, poc: 0 ng/mL (ref 0.00–0.08)

## 2012-07-22 LAB — POCT I-STAT, CHEM 8
Calcium, Ion: 1.2 mmol/L (ref 1.12–1.23)
Hemoglobin: 12.6 g/dL (ref 12.0–15.0)
Sodium: 140 mEq/L (ref 135–145)
TCO2: 22 mmol/L (ref 0–100)

## 2013-01-14 ENCOUNTER — Inpatient Hospital Stay (HOSPITAL_COMMUNITY)
Admission: AD | Admit: 2013-01-14 | Discharge: 2013-01-14 | Disposition: A | Discharge status: Home / Self Care | Payer: Self-pay | Source: Ambulatory Visit | Attending: Obstetrics and Gynecology | Admitting: Obstetrics and Gynecology

## 2013-01-14 ENCOUNTER — Encounter (HOSPITAL_COMMUNITY): Payer: Self-pay | Admitting: *Deleted

## 2013-01-14 DIAGNOSIS — B9689 Other specified bacterial agents as the cause of diseases classified elsewhere: Secondary | ICD-10-CM

## 2013-01-14 DIAGNOSIS — N760 Acute vaginitis: Secondary | ICD-10-CM

## 2013-01-14 DIAGNOSIS — L293 Anogenital pruritus, unspecified: Secondary | ICD-10-CM | POA: Insufficient documentation

## 2013-01-14 DIAGNOSIS — A499 Bacterial infection, unspecified: Secondary | ICD-10-CM

## 2013-01-14 DIAGNOSIS — N949 Unspecified condition associated with female genital organs and menstrual cycle: Secondary | ICD-10-CM | POA: Insufficient documentation

## 2013-01-14 LAB — URINALYSIS, ROUTINE W REFLEX MICROSCOPIC
Glucose, UA: NEGATIVE mg/dL
Hgb urine dipstick: NEGATIVE
Ketones, ur: NEGATIVE mg/dL
Protein, ur: NEGATIVE mg/dL
pH: 8.5 — ABNORMAL HIGH (ref 5.0–8.0)

## 2013-01-14 LAB — POCT PREGNANCY, URINE: Preg Test, Ur: NEGATIVE

## 2013-01-14 LAB — WET PREP, GENITAL: Trich, Wet Prep: NONE SEEN

## 2013-01-14 MED ORDER — METRONIDAZOLE 500 MG PO TABS: 500.0000 mg | ORAL_TABLET | Freq: Two times a day (BID) | ORAL | Status: DC

## 2013-01-14 NOTE — MAU Provider Note (Signed)
History     CSN: [id number]  Arrival date and time: 01/14/13 1728   First Provider Initiated Contact with Patient 01/14/13 2018      Chief Complaint  Patient presents with  . Groin Swelling   HPI  Jennifer Leach is a 32 y.o. [phone] who presents today with vulvar pain and itching since Sunday. She states that her piercing is swollen.   Past Medical History  Diagnosis Date  . No pertinent past medical history   . Medical history non-contributory     History reviewed. No pertinent past surgical history.  History reviewed. No pertinent family history.  History  Substance Use Topics  . Smoking status: Current Some Day Smoker -- 0.25 packs/day for 9 years    Types: Cigarettes    Last Attempt to Quit: 04/12/2012  . Smokeless tobacco: Never Used  . Alcohol Use: No    Allergies: No Known Allergies  Prescriptions prior to admission  Medication Sig Dispense Refill  . acetaminophen (TYLENOL) 500 MG tablet Take 500 mg by mouth every 6 (six) hours as needed for pain (headache).      Marland Kitchen aspirin-sod bicarb-citric acid (ALKA-SELTZER) 325 MG TBEF Take 325 mg by mouth every 6 (six) hours as needed.      Marland Kitchen dextromethorphan-guaiFENesin (MUCINEX DM) 30-600 MG per 12 hr tablet Take 1 tablet by mouth every 12 (twelve) hours.        Review of Systems  Constitutional: Negative for fever and chills.  Eyes: Negative for blurred vision.  Respiratory: Negative for shortness of breath.   Cardiovascular: Negative for chest pain.  Gastrointestinal: Negative for nausea, vomiting and abdominal pain.  Genitourinary: Positive for dysuria. Negative for urgency and frequency.  Neurological: Negative for dizziness and headaches.   Physical Exam   Blood pressure 126/92, pulse 63, temperature 97.5 F (36.4 C), temperature source Oral, resp. rate 18, height 5\' 6"  (1.676 m), weight 146 lb 6 oz (66.395 kg), last menstrual period 12/26/2012, not currently breastfeeding.  Physical Exam  Nursing note  and vitals reviewed. Constitutional: She is oriented to person, place, and time. She appears well-developed and well-nourished. No distress.  Cardiovascular: Normal rate.   Respiratory: Effort normal.  GI: Soft.  Genitourinary:   External: piercing is swollen, but no discharge is seen from it Vagina: small amount of frothy white discahrge Cervix: pink, smooth, no CMT Uterus: AV, NSSC Adnexa: NT  Neurological: She is alert and oriented to person, place, and time.  Skin: Skin is warm and dry.  Psychiatric: She has a normal mood and affect.    MAU Course  Procedures  Results for orders placed during the hospital encounter of 01/14/13 (from the past 24 hour(s))  POCT PREGNANCY, URINE     Status: None   Collection Time    01/14/13  5:59 PM      Result Value Range   Preg Test, Ur NEGATIVE  NEGATIVE  URINALYSIS, ROUTINE W REFLEX MICROSCOPIC     Status: Abnormal   Collection Time    01/14/13  7:50 PM      Result Value Range   Color, Urine YELLOW  YELLOW   APPearance CLEAR  CLEAR   Specific Gravity, Urine 1.020  1.005 - 1.030   pH 8.5 (*) 5.0 - 8.0   Glucose, UA NEGATIVE  NEGATIVE mg/dL   Hgb urine dipstick NEGATIVE  NEGATIVE   Bilirubin Urine NEGATIVE  NEGATIVE   Ketones, ur NEGATIVE  NEGATIVE mg/dL   Protein, ur NEGATIVE  NEGATIVE mg/dL  Urobilinogen, UA 1.0  0.0 - 1.0 mg/dL   Nitrite NEGATIVE  NEGATIVE   Leukocytes, UA NEGATIVE  NEGATIVE  WET PREP, GENITAL     Status: Abnormal   Collection Time    01/14/13  8:21 PM      Result Value Range   Yeast Wet Prep HPF POC NONE SEEN  NONE SEEN   Trich, Wet Prep NONE SEEN  NONE SEEN   Clue Cells Wet Prep HPF POC MODERATE (*) NONE SEEN   WBC, Wet Prep HPF POC FEW (*) NONE SEEN     Assessment and Plan   1. BV (bacterial vaginosis)    RX: flagyl 500mg  po BID #14 Return to MAU as needed  Tawnya Crook 01/14/2013, 8:19 PM

## 2013-01-14 NOTE — MAU Note (Signed)
Pt states had intercourse unprotected on Sunday, denies hx of HSV. Has clitoral piercing and is swollen, vagina itchy and irritated, no abnormal vaginal discharge.

## 2013-01-15 NOTE — MAU Provider Note (Signed)
Attestation of Attending Supervision of Advanced Practitioner (CNM/NP): Evaluation and management procedures were performed by the Advanced Practitioner under my supervision and collaboration.  I have reviewed the Advanced Practitioner's note and chart, and I agree with the management and plan.  Zyshonne Malecha 01/15/2013 8:35 AM

## 2013-03-25 ENCOUNTER — Encounter (HOSPITAL_COMMUNITY): Payer: Self-pay | Admitting: *Deleted

## 2013-03-25 ENCOUNTER — Inpatient Hospital Stay (HOSPITAL_COMMUNITY)
Admission: AD | Admit: 2013-03-25 | Discharge: 2013-03-25 | Disposition: A | Discharge status: Home / Self Care | Payer: Self-pay | Source: Ambulatory Visit | Attending: Family Medicine | Admitting: Family Medicine

## 2013-03-25 DIAGNOSIS — L738 Other specified follicular disorders: Secondary | ICD-10-CM | POA: Insufficient documentation

## 2013-03-25 DIAGNOSIS — N949 Unspecified condition associated with female genital organs and menstrual cycle: Secondary | ICD-10-CM | POA: Insufficient documentation

## 2013-03-25 DIAGNOSIS — L739 Follicular disorder, unspecified: Secondary | ICD-10-CM

## 2013-03-25 DIAGNOSIS — R1031 Right lower quadrant pain: Secondary | ICD-10-CM | POA: Insufficient documentation

## 2013-03-25 LAB — POCT PREGNANCY, URINE: Preg Test, Ur: NEGATIVE

## 2013-03-25 LAB — CBC
HCT: 38.5 % (ref 36.0–46.0)
MCH: 31.3 pg (ref 26.0–34.0)
MCHC: 35.1 g/dL (ref 30.0–36.0)
MCV: 89.3 fL (ref 78.0–100.0)
Platelets: 183 10*3/uL (ref 150–400)
RBC: 4.31 MIL/uL (ref 3.87–5.11)
WBC: 6.3 10*3/uL (ref 4.0–10.5)

## 2013-03-25 LAB — URINALYSIS, ROUTINE W REFLEX MICROSCOPIC
Bilirubin Urine: NEGATIVE
Glucose, UA: NEGATIVE mg/dL
Hgb urine dipstick: NEGATIVE
Ketones, ur: NEGATIVE mg/dL
Protein, ur: NEGATIVE mg/dL

## 2013-03-25 NOTE — MAU Note (Signed)
I've been having pain in my side and have a bump down there on the side of the lip. Bump there for 3 days and pain R lower abd off and on this month. No n/v/d. No vag bleeding. Some clear d/c

## 2013-03-25 NOTE — Progress Notes (Signed)
Written and verbal d/c instructions given and understanding voiced. 

## 2013-03-25 NOTE — MAU Provider Note (Signed)
History     CSN: [id number]  Arrival date and time: 03/25/13 1114   First Provider Initiated Contact with Patient 03/25/13 1148      Chief Complaint  Patient presents with  . Abdominal Pain  . Vaginal Pain   HPI Patient reports roughly 1 month of intermittent RLQ abdominal pain, 7/10 at its worst and lasting anywhere from 10 mins to 1 hour. She typically take Tylenol which helps to relive the pain, and there are no other exacerbating/alleviating factors that she can recall. Also, two days ago she noticed a firm, non-tender lesion without discharge on her left labia. She is currently sexually active with one partner and does not use protection or contraception. She denies fevers, chills, N/V/D, urinary symptoms, or vaginal discharge. Her LMP was 4/30, and urine pregnancy was negative here today.  OB History   Grav Para Term Preterm Abortions TAB SAB Ect Mult Living   4 3 3  1 1    3       Past Medical History  Diagnosis Date  . No pertinent past medical history   . Medical history non-contributory   . Complication of anesthesia     Past Surgical History  Procedure Laterality Date  . Induced abortion      Family History  Problem Relation Age of Onset  . Hypertension Mother     History  Substance Use Topics  . Smoking status: Former Smoker -- 0.25 packs/day for 9 years    Types: Cigarettes    Quit date: 04/12/2012  . Smokeless tobacco: Never Used  . Alcohol Use: No    Allergies: No Known Allergies  Prescriptions prior to admission  Medication Sig Dispense Refill  . acetaminophen (TYLENOL) 500 MG tablet Take 500 mg by mouth every 6 (six) hours as needed for pain (headache).        Review of Systems  Constitutional: Negative for fever, chills and weight loss.  Eyes: Negative for blurred vision.  Respiratory: Negative for cough.   Cardiovascular: Negative for chest pain.  Gastrointestinal: Positive for abdominal pain. Negative for nausea, vomiting, diarrhea,  constipation and blood in stool.  Genitourinary: Negative for dysuria, urgency, frequency, hematuria and flank pain.  Musculoskeletal: Negative for myalgias.  Skin: Negative for itching and rash.  Neurological: Negative for dizziness and headaches.   Physical Exam   Blood pressure 115/73, pulse 81, temperature 96.7 F (35.9 C), resp. rate 20, height 5\' 6"  (1.676 m), weight 68.221 kg (150 lb 6.4 oz), last menstrual period 02/05/2013, SpO2 100.00%.  Physical Exam  Constitutional: She is oriented to person, place, and time. She appears well-developed and well-nourished.  HENT:  Head: Normocephalic and atraumatic.  Neck: Normal range of motion.  Cardiovascular: Normal rate, regular rhythm, normal heart sounds and intact distal pulses.  Exam reveals no gallop and no friction rub.   No murmur heard. Respiratory: Effort normal and breath sounds normal.  GI: Soft. Bowel sounds are normal. She exhibits no distension and no mass. There is no tenderness. There is no rebound and no guarding.  Genitourinary:    No labial fusion. There is lesion (See depiction) on the right labia. There is no rash, tenderness or injury on the right labia. There is no rash, tenderness, lesion or injury on the left labia. No tenderness or bleeding around the vagina. No signs of injury around the vagina. No vaginal discharge found.  Musculoskeletal: Normal range of motion.  Neurological: She is alert and oriented to person, place, and time.  Skin: Skin is warm and dry.  Psychiatric: She has a normal mood and affect. Her behavior is normal.    MAU Course  Procedures None  MDM RPR, GC/Chlamydia pending  Assessment and Plan  A: Labial folliculitis   P: 1. Await results of RPR 2. D/C home, return to clinic should symptoms return or worsen.  Arther Abbott 03/25/2013, 12:05 PM   Most likely folliculitis, but lesion is painless and appears ulcerated. Pt to follow up if symptoms worsen. RPR ordered.   I saw  and examined patient along with student and agree with above note.   Jola Critzer 03/28/2013 10:02 AM

## 2013-03-26 LAB — GC/CHLAMYDIA PROBE AMP: GC Probe RNA: NEGATIVE

## 2013-03-28 NOTE — MAU Provider Note (Signed)
Attestation of Attending Supervision of Advanced Practitioner (CNM/NP): Evaluation and management procedures were performed by the Advanced Practitioner under my supervision and collaboration.  I have reviewed the Advanced Practitioner's note and chart, and I agree with the management and plan.  HARRAWAY-SMITH, Jennifer Leach 10:18 AM     

## 2013-05-01 ENCOUNTER — Encounter (HOSPITAL_COMMUNITY): Payer: Self-pay

## 2013-05-01 ENCOUNTER — Inpatient Hospital Stay (HOSPITAL_COMMUNITY)
Admission: AD | Admit: 2013-05-01 | Discharge: 2013-05-01 | Disposition: A | Discharge status: Home / Self Care | Payer: Self-pay | Source: Ambulatory Visit | Attending: Obstetrics & Gynecology | Admitting: Obstetrics & Gynecology

## 2013-05-01 DIAGNOSIS — N390 Urinary tract infection, site not specified: Secondary | ICD-10-CM | POA: Insufficient documentation

## 2013-05-01 DIAGNOSIS — R1032 Left lower quadrant pain: Secondary | ICD-10-CM | POA: Insufficient documentation

## 2013-05-01 LAB — URINALYSIS, ROUTINE W REFLEX MICROSCOPIC
Bilirubin Urine: NEGATIVE
Glucose, UA: NEGATIVE mg/dL
Ketones, ur: NEGATIVE mg/dL
Protein, ur: 30 mg/dL — AB
pH: 6 (ref 5.0–8.0)

## 2013-05-01 LAB — WET PREP, GENITAL
Clue Cells Wet Prep HPF POC: NONE SEEN
WBC, Wet Prep HPF POC: NONE SEEN

## 2013-05-01 LAB — URINE MICROSCOPIC-ADD ON

## 2013-05-01 MED ORDER — IBUPROFEN 800 MG PO TABS
800.0000 mg | ORAL_TABLET | Freq: Once | ORAL | Status: AC
  Administered 2013-05-01: 800 mg via ORAL
  Filled 2013-05-01: qty 1

## 2013-05-01 MED ORDER — CIPROFLOXACIN HCL 250 MG PO TABS: 250.0000 mg | ORAL_TABLET | Freq: Two times a day (BID) | ORAL | Status: DC

## 2013-05-01 MED ORDER — PHENAZOPYRIDINE HCL 200 MG PO TABS: 200.0000 mg | ORAL_TABLET | Freq: Three times a day (TID) | ORAL | Status: DC

## 2013-05-01 NOTE — MAU Note (Signed)
Patient states she has had pain on the left lower abdomen for about a week but worse today. States her urine smells strong and "feels funny" with urination. States she has a slight discharge and nausea, no vomiting.

## 2013-05-01 NOTE — MAU Provider Note (Signed)
Attestation of Attending Supervision of Advanced Practitioner (PA/CNM/NP): Evaluation and management procedures were performed by the Advanced Practitioner under my supervision and collaboration.  I have reviewed the Advanced Practitioner's note and chart, and I agree with the management and plan.  Kaseem Vastine, MD, FACOG Attending Obstetrician & Gynecologist Faculty Practice, [facility]  

## 2013-05-01 NOTE — MAU Note (Signed)
Patient is in with c/o bilateral lower abdominal pain that is worse on LLQ and right flank. She also c/o increased vaginal discharge and irritation. She states that she used OTC monistat without relief. She denies vaginal bleeding. lmp 6/28.

## 2013-05-01 NOTE — MAU Provider Note (Signed)
History     CSN: [id number]  Arrival date and time: 05/01/13 1036   First Provider Initiated Contact with Patient 05/01/13 1224      Chief Complaint  Patient presents with  . Abdominal Pain   HPI Ms. Jennifer Leach is a 32 y.o. [phone] who presents to MAU today with complaint of LLQ pain. The patient rates her pain at 8/10. She has taken tylenol without relief. She states that she has had some pain and pressure with urination. She feels that her urine has a strong odor. She is also having some right sided flank pain. She denies fever, N/V/D or constipation. She has had some creamy white discharge without odor this week as well.   OB History   Grav Para Term Preterm Abortions TAB SAB Ect Mult Living   4 3 3  1 1    3       Past Medical History  Diagnosis Date  . No pertinent past medical history   . Medical history non-contributory   . Complication of anesthesia     Past Surgical History  Procedure Laterality Date  . Induced abortion      Family History  Problem Relation Age of Onset  . Hypertension Mother     History  Substance Use Topics  . Smoking status: Former Smoker -- 0.25 packs/day for 9 years    Types: Cigarettes    Quit date: 04/12/2012  . Smokeless tobacco: Never Used  . Alcohol Use: No    Allergies: No Known Allergies  No prescriptions prior to admission    Review of Systems  Constitutional: Negative for fever and malaise/fatigue.  Gastrointestinal: Positive for abdominal pain. Negative for nausea, vomiting, diarrhea and constipation.  Genitourinary: Positive for dysuria, urgency, frequency and flank pain. Negative for hematuria.       Neg - vaginal bleeding + Vaginal discharge   Physical Exam   Blood pressure 108/67, pulse 59, temperature 97.5 F (36.4 C), temperature source Oral, resp. rate 16, height 5\' 7"  (1.702 m), weight 152 lb 3.2 oz (69.037 kg), last menstrual period 04/05/2013, SpO2 100.00%.  Physical Exam  Constitutional: She is  oriented to person, place, and time. She appears well-developed and well-nourished. No distress.  HENT:  Head: Normocephalic and atraumatic.  Cardiovascular: Normal rate, regular rhythm and normal heart sounds.   Respiratory: Effort normal and breath sounds normal. No respiratory distress.  GI: Soft. Bowel sounds are normal. She exhibits no distension and no mass. There is tenderness (moderate tenderness to the suprapubic region more prominent on the left side). There is no rebound and no guarding.  Genitourinary: Uterus is not enlarged and not tender. Cervix exhibits no motion tenderness, no discharge and no friability. Right adnexum displays no mass and no tenderness. Left adnexum displays no mass and no tenderness. No bleeding around the vagina. Vaginal discharge (small amount of thin, white, mucus discharge noted) found.  Neurological: She is alert and oriented to person, place, and time.  Skin: Skin is warm and dry. No erythema.  Psychiatric: She has a normal mood and affect.   Results for orders placed during the hospital encounter of 05/01/13 (from the past 24 hour(s))  URINALYSIS, ROUTINE W REFLEX MICROSCOPIC     Status: Abnormal   Collection Time    05/01/13 11:00 AM      Result Value Range   Color, Urine YELLOW  YELLOW   APPearance CLEAR  CLEAR   Specific Gravity, Urine >1.030 (*) 1.005 - 1.030  pH 6.0  5.0 - 8.0   Glucose, UA NEGATIVE  NEGATIVE mg/dL   Hgb urine dipstick LARGE (*) NEGATIVE   Bilirubin Urine NEGATIVE  NEGATIVE   Ketones, ur NEGATIVE  NEGATIVE mg/dL   Protein, ur 30 (*) NEGATIVE mg/dL   Urobilinogen, UA 0.2  0.0 - 1.0 mg/dL   Nitrite POSITIVE (*) NEGATIVE   Leukocytes, UA MODERATE (*) NEGATIVE  URINE MICROSCOPIC-ADD ON     Status: Abnormal   Collection Time    05/01/13 11:00 AM      Result Value Range   Squamous Epithelial / LPF RARE  RARE   WBC, UA 21-50  <3 WBC/hpf   Bacteria, UA MANY (*) RARE  POCT PREGNANCY, URINE     Status: None   Collection Time     05/01/13 11:03 AM      Result Value Range   Preg Test, Ur NEGATIVE  NEGATIVE  WET PREP, GENITAL     Status: None   Collection Time    05/01/13  1:00 PM      Result Value Range   Yeast Wet Prep HPF POC NONE SEEN  NONE SEEN   Trich, Wet Prep NONE SEEN  NONE SEEN   Clue Cells Wet Prep HPF POC NONE SEEN  NONE SEEN   WBC, Wet Prep HPF POC NONE SEEN  NONE SEEN    MAU Course  Procedures None  MDM UPT - negative UA, Wet prep and GC/chlamdyia today  Assessment and Plan  A: UTI  P: Discharge home Rx for Cipro and Pyridium sent to patient's pharmacy Patient advised to increase PO hydration as tolerated Discussed warning signs for pyelonephritis Patient may return to MAU as needed or if her condition were to change or worsen  Freddi Starr, PA-C  05/01/2013, 2:46 PM

## 2013-05-02 LAB — GC/CHLAMYDIA PROBE AMP
CT Probe RNA: NEGATIVE
GC Probe RNA: NEGATIVE

## 2013-05-03 LAB — URINE CULTURE: Colony Count: >=100000

## 2013-07-12 ENCOUNTER — Encounter (HOSPITAL_COMMUNITY): Payer: Self-pay | Admitting: *Deleted

## 2013-07-12 ENCOUNTER — Inpatient Hospital Stay (HOSPITAL_COMMUNITY)
Admission: AD | Admit: 2013-07-12 | Discharge: 2013-07-12 | Disposition: A | Discharge status: Home / Self Care | Payer: Self-pay | Source: Ambulatory Visit | Attending: Obstetrics & Gynecology | Admitting: Obstetrics & Gynecology

## 2013-07-12 DIAGNOSIS — B373 Candidiasis of vulva and vagina: Secondary | ICD-10-CM

## 2013-07-12 DIAGNOSIS — B3731 Acute candidiasis of vulva and vagina: Secondary | ICD-10-CM | POA: Insufficient documentation

## 2013-07-12 DIAGNOSIS — L293 Anogenital pruritus, unspecified: Secondary | ICD-10-CM | POA: Insufficient documentation

## 2013-07-12 LAB — URINALYSIS, ROUTINE W REFLEX MICROSCOPIC
Ketones, ur: NEGATIVE mg/dL
Nitrite: NEGATIVE
Protein, ur: NEGATIVE mg/dL
Urobilinogen, UA: 0.2 mg/dL (ref 0.0–1.0)

## 2013-07-12 LAB — URINE MICROSCOPIC-ADD ON

## 2013-07-12 LAB — WET PREP, GENITAL: Clue Cells Wet Prep HPF POC: NONE SEEN

## 2013-07-12 LAB — POCT PREGNANCY, URINE: Preg Test, Ur: NEGATIVE

## 2013-07-12 MED ORDER — FLUCONAZOLE 150 MG PO TABS
150.0000 mg | ORAL_TABLET | Freq: Every day | ORAL | Status: DC
  Administered 2013-07-12: 150 mg via ORAL
  Filled 2013-07-12: qty 1

## 2013-07-12 MED ORDER — FLUCONAZOLE 150 MG PO TABS: 150.0000 mg | ORAL_TABLET | Freq: Every day | ORAL | Status: DC

## 2013-07-12 NOTE — MAU Note (Signed)
Patient presents with vaginal irritation. Patient started using some cream for a yeast infection last Friday but the irritation continues. Patient also complains of abdominal cramping x 1 week.

## 2013-07-12 NOTE — MAU Provider Note (Signed)
History     CSN: [id number]  Arrival date and time: 07/12/13 4098   First Provider Initiated Contact with Patient 07/12/13 0402      Chief Complaint  Patient presents with  . Vaginal Itching  . Abdominal Cramping   HPI  Ms. Jennifer Leach is a 32 y.o. non pregnant female [phone] who presents to MAU with vaginal irritation. Symptoms started 1 week ago, and have progressively gotten worsen. Symptoms include burning and itching around the vagina. She denies abnormal vaginal discharge or odor. She has been using over the counter medication for yeast, and it helps but only minimally. Pt is currently being treated with Cipro for UTI.   OB History   Grav Para Term Preterm Abortions TAB SAB Ect Mult Living   4 3 3  1 1    3       Past Medical History  Diagnosis Date  . No pertinent past medical history   . Medical history non-contributory   . Complication of anesthesia     Past Surgical History  Procedure Laterality Date  . Induced abortion      Family History  Problem Relation Age of Onset  . Hypertension Mother     History  Substance Use Topics  . Smoking status: Former Smoker -- 0.25 packs/day for 9 years    Types: Cigarettes    Quit date: 04/12/2012  . Smokeless tobacco: Never Used  . Alcohol Use: No    Allergies: No Known Allergies  Prescriptions prior to admission  Medication Sig Dispense Refill  . acetaminophen (TYLENOL) 500 MG tablet Take 500 mg by mouth every 6 (six) hours as needed for pain (headache).      . ciprofloxacin (CIPRO) 250 MG tablet Take 1 tablet (250 mg total) by mouth every 12 (twelve) hours.  10 tablet  0  . phenazopyridine (PYRIDIUM) 200 MG tablet Take 1 tablet (200 mg total) by mouth 3 (three) times daily.  6 tablet  0   Results for orders placed during the hospital encounter of 07/12/13 (from the past 24 hour(s))  URINALYSIS, ROUTINE W REFLEX MICROSCOPIC     Status: Abnormal   Collection Time    07/12/13  3:45 AM      Result Value  Range   Color, Urine YELLOW  YELLOW   APPearance CLEAR  CLEAR   Specific Gravity, Urine 1.025  1.005 - 1.030   pH 6.5  5.0 - 8.0   Glucose, UA NEGATIVE  NEGATIVE mg/dL   Hgb urine dipstick NEGATIVE  NEGATIVE   Bilirubin Urine NEGATIVE  NEGATIVE   Ketones, ur NEGATIVE  NEGATIVE mg/dL   Protein, ur NEGATIVE  NEGATIVE mg/dL   Urobilinogen, UA 0.2  0.0 - 1.0 mg/dL   Nitrite NEGATIVE  NEGATIVE   Leukocytes, UA MODERATE (*) NEGATIVE  URINE MICROSCOPIC-ADD ON     Status: Abnormal   Collection Time    07/12/13  3:45 AM      Result Value Range   Squamous Epithelial / LPF FEW (*) RARE   WBC, UA 7-10  <3 WBC/hpf   Bacteria, UA FEW (*) RARE   Urine-Other MUCOUS PRESENT    POCT PREGNANCY, URINE     Status: None   Collection Time    07/12/13  3:54 AM      Result Value Range   Preg Test, Ur NEGATIVE  NEGATIVE  WET PREP, GENITAL     Status: Abnormal   Collection Time    07/12/13  4:11 AM  Result Value Range   Yeast Wet Prep HPF POC NONE SEEN  NONE SEEN   Trich, Wet Prep NONE SEEN  NONE SEEN   Clue Cells Wet Prep HPF POC NONE SEEN  NONE SEEN   WBC, Wet Prep HPF POC FEW (*) NONE SEEN   Review of Systems  Constitutional: Negative for fever and chills.  Gastrointestinal: Positive for abdominal pain. Negative for nausea and vomiting.       +abdominal cramping   Genitourinary: Negative for dysuria, urgency, frequency and hematuria.       No vaginal discharge. No vaginal bleeding. No dysuria.   Neurological: Negative for headaches.   Physical Exam   Blood pressure 116/78, pulse 60, temperature 97.7 F (36.5 C), temperature source Oral, resp. rate 16, height 5\' 6"  (1.676 m), weight 148 lb (67.132 kg), last menstrual period 06/06/2013, not currently breastfeeding.  Physical Exam  Constitutional: She is oriented to person, place, and time. She appears well-developed and well-nourished.  Eyes: Pupils are equal, round, and reactive to light.  Neck: Neck supple.  Respiratory: Effort  normal.  GI: Soft. She exhibits no distension. There is no tenderness. There is no rebound and no guarding.  Genitourinary: There is no tenderness on the right labia. There is no tenderness on the left labia. No erythema or tenderness around the vagina. No vaginal discharge found.  Speculum exam: Vagina - Small amount of creamy discharge, no odor Cervix - No contact bleeding Bimanual exam: Cervix closed Uterus non tender, normal size Adnexa non tender, no masses bilaterally GC/Chlam, wet prep done Chaperone present for exam.   Musculoskeletal: Normal range of motion.  Neurological: She is alert and oriented to person, place, and time.  Skin: Skin is warm and dry.    MAU Course  Procedures  MDM UA UPT Wet prep GC/Chlamydia  Diflucan 150 mg PO once in MAU   Assessment and Plan  A: Yeast vaginitis   P: Discharge home RX: Diflucan 150 mg PO times 1 additional dose in three days if needed Return to MAU if symptoms worsen No scented soaps or creams; stop over the counter cream  RASCH, JENNIFER IRENE 07/12/2013, 4:02 AM

## 2013-07-12 NOTE — MAU Provider Note (Signed)
Attestation of Attending Supervision of Advanced Practitioner (PA/CNM/NP): Evaluation and management procedures were performed by the Advanced Practitioner under my supervision and collaboration.  I have reviewed the Advanced Practitioner's note and chart, and I agree with the management and plan.  Argel Pablo, MD, FACOG Attending Obstetrician & Gynecologist Faculty Practice, [facility]  

## 2013-07-13 LAB — URINE CULTURE

## 2013-07-14 LAB — GC/CHLAMYDIA PROBE AMP: GC Probe RNA: NEGATIVE

## 2013-09-23 ENCOUNTER — Inpatient Hospital Stay (HOSPITAL_COMMUNITY)
Admission: AD | Admit: 2013-09-23 | Discharge: 2013-09-23 | Disposition: A | Discharge status: Home / Self Care | Payer: Medicaid Other | Source: Ambulatory Visit | Attending: Obstetrics & Gynecology | Admitting: Obstetrics & Gynecology

## 2013-09-23 ENCOUNTER — Encounter (HOSPITAL_COMMUNITY): Payer: Self-pay | Admitting: *Deleted

## 2013-09-23 DIAGNOSIS — N946 Dysmenorrhea, unspecified: Secondary | ICD-10-CM

## 2013-09-23 DIAGNOSIS — R109 Unspecified abdominal pain: Secondary | ICD-10-CM | POA: Insufficient documentation

## 2013-09-23 DIAGNOSIS — N949 Unspecified condition associated with female genital organs and menstrual cycle: Secondary | ICD-10-CM | POA: Insufficient documentation

## 2013-09-23 DIAGNOSIS — N926 Irregular menstruation, unspecified: Secondary | ICD-10-CM

## 2013-09-23 HISTORY — DX: Gonococcal infection, unspecified: A54.9

## 2013-09-23 LAB — URINALYSIS, ROUTINE W REFLEX MICROSCOPIC
Bilirubin Urine: NEGATIVE
Glucose, UA: NEGATIVE mg/dL
Hgb urine dipstick: NEGATIVE
Ketones, ur: NEGATIVE mg/dL
Leukocytes, UA: NEGATIVE
Specific Gravity, Urine: 1.02 (ref 1.005–1.030)
pH: 6.5 (ref 5.0–8.0)

## 2013-09-23 LAB — POCT PREGNANCY, URINE: Preg Test, Ur: NEGATIVE

## 2013-09-23 LAB — WET PREP, GENITAL: Clue Cells Wet Prep HPF POC: NONE SEEN

## 2013-09-23 MED ORDER — IBUPROFEN 600 MG PO TABS: 600.0000 mg | ORAL_TABLET | Freq: Four times a day (QID) | ORAL | Status: DC | PRN

## 2013-09-23 MED ORDER — KETOROLAC TROMETHAMINE 60 MG/2ML IM SOLN
60.0000 mg | INTRAMUSCULAR | Status: AC
  Administered 2013-09-23: 60 mg via INTRAMUSCULAR
  Filled 2013-09-23: qty 2

## 2013-09-23 NOTE — MAU Provider Note (Signed)
Chief Complaint: Abdominal Pain   First Provider Initiated Contact with Patient 09/23/13 0855     SUBJECTIVE HPI: Jennifer Leach is a 32 y.o. [id number] who presents to maternity admissions reporting lower abdominal cramping x2 days and mucus vaginal discharge today which is unusual for her, without odor/itching/burning.  She has appointment at [facility] in January for Depo but is interested in [place].  She is also interested in switching to the hospital clinic for Gyn care.  She denies vaginal bleeding, urinary symptoms, h/a, dizziness, n/v, or fever/chills.     Past Medical History  Diagnosis Date  . No pertinent past medical history   . Complication of anesthesia   . Gonorrhea    Past Surgical History  Procedure Laterality Date  . Induced abortion     History   Social History  . Marital Status: Single    Spouse Name: N/A    Number of Children: N/A  . Years of Education: N/A   Occupational History  . Not on file.   Social History Main Topics  . Smoking status: Former Smoker -- 0.25 packs/day for 9 years    Types: Cigarettes    Quit date: 04/12/2012  . Smokeless tobacco: Never Used  . Alcohol Use: No  . Drug Use: No  . Sexual Activity: Yes    Birth Control/ Protection: None   Other Topics Concern  . Not on file   Social History Narrative  . No narrative on file   No current facility-administered medications on file prior to encounter.   No current outpatient prescriptions on file prior to encounter.   No Known Allergies  ROS: Pertinent items in HPI  OBJECTIVE Blood pressure 115/71, pulse 69, temperature 98.1 F (36.7 C), temperature source Oral, resp. rate 18, height 5\' 6"  (1.676 m), weight 68.947 kg (152 lb), last menstrual period 09/05/2013. GENERAL: Well-developed, well-nourished female in no acute distress.  HEENT: Normocephalic HEART: normal rate RESP: normal effort ABDOMEN: Soft, non-tender EXTREMITIES: Nontender, no edema NEURO: Alert and  oriented Pelvic exam: Cervix pink, visually closed, without lesion, scant white thick discharge, vaginal walls and external genitalia normal Bimanual exam: Cervix 0/long/high, firm, anterior, neg CMT, uterus nontender, nonenlarged, adnexa without tenderness, enlargement, or mass  LAB RESULTS Results for orders placed during the hospital encounter of 09/23/13 (from the past 24 hour(s))  URINALYSIS, ROUTINE W REFLEX MICROSCOPIC     Status: None   Collection Time    09/23/13  8:38 AM      Result Value Range   Color, Urine YELLOW  YELLOW   APPearance CLEAR  CLEAR   Specific Gravity, Urine 1.020  1.005 - 1.030   pH 6.5  5.0 - 8.0   Glucose, UA NEGATIVE  NEGATIVE mg/dL   Hgb urine dipstick NEGATIVE  NEGATIVE   Bilirubin Urine NEGATIVE  NEGATIVE   Ketones, ur NEGATIVE  NEGATIVE mg/dL   Protein, ur NEGATIVE  NEGATIVE mg/dL   Urobilinogen, UA 0.2  0.0 - 1.0 mg/dL   Nitrite NEGATIVE  NEGATIVE   Leukocytes, UA NEGATIVE  NEGATIVE  POCT PREGNANCY, URINE     Status: None   Collection Time    09/23/13  8:42 AM      Result Value Range   Preg Test, Ur NEGATIVE  NEGATIVE  WET PREP, GENITAL     Status: Abnormal   Collection Time    09/23/13  9:02 AM      Result Value Range   Yeast Wet Prep HPF POC NONE SEEN  NONE SEEN   Trich, Wet Prep NONE SEEN  NONE SEEN   Clue Cells Wet Prep HPF POC NONE SEEN  NONE SEEN   WBC, Wet Prep HPF POC FEW (*) NONE SEEN    ASSESSMENT 1. Dysmenorrhea   2. Irregular menses     PLAN Discharge home Ibuprofen 600 mg Q 6 hours [facility] to change contraception/place Mirena Call [facility] if follow up Gyn desired--phone number given Return to MAU as needed    Medication List         ibuprofen 600 MG tablet  Commonly known as:  ADVIL,MOTRIN  Take 1 tablet (600 mg total) by mouth every 6 (six) hours as needed.     multivitamin with minerals Tabs tablet  Take 1 tablet by mouth daily.       Follow-up Information   Follow up with  [facility] [place]. (Keep scheduled appointment.  Return to MAU as needed.)    Contact information:   [address] [place] [address] [phone]      Sharen Counter Certified Nurse-Midwife 09/23/2013  11:12 AM

## 2013-09-23 NOTE — MAU Provider Note (Signed)
Attestation of Attending Supervision of Advanced Practitioner (CNM/NP): Evaluation and management procedures were performed by the Advanced Practitioner under my supervision and collaboration.  I have reviewed the Advanced Practitioner's note and chart, and I agree with the management and plan.  HARRAWAY-SMITH, Lorissa Kishbaugh 3:26 PM     

## 2013-09-23 NOTE — MAU Note (Signed)
C/O lower abdominal pain/cramping off and on for 2 days. Slimy whitish vaginal D/C. No odor.

## 2013-09-25 LAB — GC/CHLAMYDIA PROBE AMP: GC Probe RNA: NEGATIVE

## 2013-10-09 ENCOUNTER — Inpatient Hospital Stay (HOSPITAL_COMMUNITY)
Admission: AD | Admit: 2013-10-09 | Discharge: 2013-10-09 | Disposition: A | Discharge status: Home / Self Care | Payer: Medicaid Other | Source: Ambulatory Visit | Attending: Obstetrics & Gynecology | Admitting: Obstetrics & Gynecology

## 2013-10-09 ENCOUNTER — Encounter (HOSPITAL_COMMUNITY): Payer: Self-pay | Admitting: *Deleted

## 2013-10-09 DIAGNOSIS — N760 Acute vaginitis: Secondary | ICD-10-CM | POA: Insufficient documentation

## 2013-10-09 DIAGNOSIS — R109 Unspecified abdominal pain: Secondary | ICD-10-CM | POA: Insufficient documentation

## 2013-10-09 DIAGNOSIS — Z87891 Personal history of nicotine dependence: Secondary | ICD-10-CM | POA: Insufficient documentation

## 2013-10-09 DIAGNOSIS — A499 Bacterial infection, unspecified: Secondary | ICD-10-CM

## 2013-10-09 DIAGNOSIS — B9689 Other specified bacterial agents as the cause of diseases classified elsewhere: Secondary | ICD-10-CM | POA: Insufficient documentation

## 2013-10-09 LAB — WET PREP, GENITAL
Trich, Wet Prep: NONE SEEN
Yeast Wet Prep HPF POC: NONE SEEN

## 2013-10-09 LAB — URINALYSIS, ROUTINE W REFLEX MICROSCOPIC
Bilirubin Urine: NEGATIVE
GLUCOSE, UA: NEGATIVE mg/dL
HGB URINE DIPSTICK: NEGATIVE
Ketones, ur: NEGATIVE mg/dL
LEUKOCYTES UA: NEGATIVE
Nitrite: NEGATIVE
PROTEIN: NEGATIVE mg/dL
SPECIFIC GRAVITY, URINE: 1.015 (ref 1.005–1.030)
UROBILINOGEN UA: 1 mg/dL (ref 0.0–1.0)
pH: 7 (ref 5.0–8.0)

## 2013-10-09 LAB — POCT PREGNANCY, URINE: PREG TEST UR: NEGATIVE

## 2013-10-09 MED ORDER — METRONIDAZOLE 500 MG PO TABS: 500.0000 mg | ORAL_TABLET | Freq: Two times a day (BID) | ORAL | Status: DC

## 2013-10-09 NOTE — Discharge Instructions (Signed)
Bacterial Vaginosis Bacterial vaginosis (BV) is a vaginal infection where the normal balance of bacteria in the vagina is disrupted. The normal balance is then replaced by an overgrowth of certain bacteria. There are several different kinds of bacteria that can cause BV. BV is the most common vaginal infection in women of childbearing age. CAUSES   The cause of BV is not fully understood. BV develops when there is an increase or imbalance of harmful bacteria.  Some activities or behaviors can upset the normal balance of bacteria in the vagina and put women at increased risk including:  Having a new sex partner or multiple sex partners.  Douching.  Using an intrauterine device (IUD) for contraception.  It is not clear what role sexual activity plays in the development of BV. However, women that have never had sexual intercourse are rarely infected with BV. Women do not get BV from toilet seats, bedding, swimming pools or from touching objects around them.  SYMPTOMS   Grey vaginal discharge.  A fish-like odor with discharge, especially after sexual intercourse.  Itching or burning of the vagina and vulva.  Burning or pain with urination.  Some women have no signs or symptoms at all. DIAGNOSIS  Your caregiver must examine the vagina for signs of BV. Your caregiver will perform lab tests and look at the sample of vaginal fluid through a microscope. They will look for bacteria and abnormal cells (clue cells), a pH test higher than 4.5, and a positive amine test all associated with BV.  RISKS AND COMPLICATIONS   Pelvic inflammatory disease (PID).  Infections following gynecology surgery.  Developing HIV.  Developing herpes virus. TREATMENT  Sometimes BV will clear up without treatment. However, all women with symptoms of BV should be treated to avoid complications, especially if gynecology surgery is planned. Female partners generally do not need to be treated. However, BV may spread  between female sex partners so treatment is helpful in preventing a recurrence of BV.   BV may be treated with antibiotics. The antibiotics come in either pill or vaginal cream forms. Either can be used with nonpregnant or pregnant women, but the recommended dosages differ. These antibiotics are not harmful to the baby.  BV can recur after treatment. If this happens, a second round of antibiotics will often be prescribed.  Treatment is important for pregnant women. If not treated, BV can cause a premature delivery, especially for a pregnant woman who had a premature birth in the past. All pregnant women who have symptoms of BV should be checked and treated.  For chronic reoccurrence of BV, treatment with a type of prescribed gel vaginally twice a week is helpful. HOME CARE INSTRUCTIONS   Finish all medication as directed by your caregiver.  Do not have sex until treatment is completed.  Tell your sexual partner that you have a vaginal infection. They should see their caregiver and be treated if they have problems, such as a mild rash or itching.  Practice safe sex. Use condoms. Only have 1 sex partner. PREVENTION  Basic prevention steps can help reduce the risk of upsetting the natural balance of bacteria in the vagina and developing BV:  Do not have sexual intercourse (be abstinent).  Do not douche.  Use all of the medicine prescribed for treatment of BV, even if the signs and symptoms go away.  Tell your sex partner if you have BV. That way, they can be treated, if needed, to prevent reoccurrence. SEEK MEDICAL CARE IF:     Your symptoms are not improving after 3 days of treatment.  You have increased discharge, pain, or fever. MAKE SURE YOU:   Understand these instructions.  Will watch your condition.  Will get help right away if you are not doing well or get worse. FOR MORE INFORMATION  Division of STD Prevention (DSTDP), Centers for Disease Control and Prevention:  www.cdc.gov/std American Social Health Association (ASHA): www.ashastd.org  Document Released: 09/25/2005 Document Revised: 12/18/2011 Document Reviewed: 05/07/2013 ExitCare Patient Information 2014 [employer].  

## 2013-10-09 NOTE — MAU Note (Signed)
Patient states she has been having lower abdominal pain today. Noticed a vaginal odor with no discharge yesterday.

## 2013-10-09 NOTE — MAU Provider Note (Signed)
History     CSN: [id number]  Arrival date and time: 10/09/13 1055   First Provider Initiated Contact with Patient 10/09/13 1303      Chief Complaint  Patient presents with  . Abdominal Pain   HPI  Pt is a 33 yo [id number] here with lower midpelvic pain that started yesterday.  Pain is described as crampy and rated a 7/10.  +vaginal odor x 1 day.  No abnormal discharge or bleeding.  Patient's last menstrual period was 10/01/2013. No current birth control method and sexually active.  Appointment for depo provera on 10/17/13.  Denies UTI symptoms.  Pt declines medication for pelvic pain, just wants to check for BV.  Past Medical History  Diagnosis Date  . No pertinent past medical history   . Gonorrhea     Past Surgical History  Procedure Laterality Date  . Induced abortion      Family History  Problem Relation Age of Onset  . Hypertension Mother     History  Substance Use Topics  . Smoking status: Former Smoker -- 0.25 packs/day for 9 years    Types: Cigarettes    Quit date: 04/12/2012  . Smokeless tobacco: Never Used  . Alcohol Use: No    Allergies: No Known Allergies  Prescriptions prior to admission  Medication Sig Dispense Refill  . ibuprofen (ADVIL,MOTRIN) 600 MG tablet Take 1 tablet (600 mg total) by mouth every 6 (six) hours as needed.  30 tablet  0  . Multiple Vitamin (MULTIVITAMIN WITH MINERALS) TABS tablet Take 1 tablet by mouth daily.        Review of Systems  Gastrointestinal: Positive for abdominal pain (cramping). Negative for nausea and vomiting.  Genitourinary: Negative for dysuria, urgency and frequency.       +vaginal odor  All other systems reviewed and are negative.   Physical Exam   Blood pressure 131/79, pulse 60, temperature 97.4 F (36.3 C), temperature source Oral, resp. rate 16, height 5\' 6"  (1.676 m), weight 69.4 kg (153 lb), last menstrual period 10/01/2013, SpO2 100.00%.  Physical Exam  Constitutional: She is oriented to person,  place, and time. She appears well-developed and well-nourished. No distress.  HENT:  Head: Normocephalic.  Neck: Normal range of motion. Neck supple.  Cardiovascular: Normal rate, regular rhythm and normal heart sounds.   Respiratory: Effort normal and breath sounds normal. No respiratory distress.  GI: Soft. She exhibits no mass. There is tenderness (midpelvic). There is no rebound, no guarding and no CVA tenderness.  Genitourinary: Cervix exhibits no motion tenderness and no discharge. Vaginal discharge (white, creamy) found.  Musculoskeletal: Normal range of motion.  Neurological: She is alert and oriented to person, place, and time.  Skin: Skin is warm and dry.  Psychiatric: She has a normal mood and affect.    MAU Course  Procedures Results for orders placed during the hospital encounter of 10/09/13 (from the past 24 hour(s))  URINALYSIS, ROUTINE W REFLEX MICROSCOPIC     Status: Abnormal   Collection Time    10/09/13 11:20 AM      Result Value Range   Color, Urine YELLOW  YELLOW   APPearance HAZY (*) CLEAR   Specific Gravity, Urine 1.015  1.005 - 1.030   pH 7.0  5.0 - 8.0   Glucose, UA NEGATIVE  NEGATIVE mg/dL   Hgb urine dipstick NEGATIVE  NEGATIVE   Bilirubin Urine NEGATIVE  NEGATIVE   Ketones, ur NEGATIVE  NEGATIVE mg/dL   Protein, ur NEGATIVE  NEGATIVE  mg/dL   Urobilinogen, UA 1.0  0.0 - 1.0 mg/dL   Nitrite NEGATIVE  NEGATIVE   Leukocytes, UA NEGATIVE  NEGATIVE  POCT PREGNANCY, URINE     Status: None   Collection Time    10/09/13 11:52 AM      Result Value Range   Preg Test, Ur NEGATIVE  NEGATIVE  WET PREP, GENITAL     Status: Abnormal   Collection Time    10/09/13  1:10 PM      Result Value Range   Yeast Wet Prep HPF POC NONE SEEN  NONE SEEN   Trich, Wet Prep NONE SEEN  NONE SEEN   Clue Cells Wet Prep HPF POC FEW (*) NONE SEEN   WBC, Wet Prep HPF POC FEW (*) NONE SEEN    Assessment and Plan  Bacterial Vaginosis  Plan: Discharge to home RX  Flagyl Follow-up as needed  [facility]   [facility] 10/09/2013, 1:04 PM

## 2013-10-10 NOTE — MAU Provider Note (Signed)
Attestation of Attending Supervision of Advanced Practitioner (PA/CNM/NP): Evaluation and management procedures were performed by the Advanced Practitioner under my supervision and collaboration.  I have reviewed the Advanced Practitioner's note and chart, and I agree with the management and plan.  Salimata Christenson, MD, FACOG Attending Obstetrician & Gynecologist Faculty Practice, [facility]  

## 2013-10-12 LAB — GC/CHLAMYDIA PROBE AMP
CT Probe RNA: NEGATIVE
GC Probe RNA: NEGATIVE

## 2013-11-02 ENCOUNTER — Emergency Department (HOSPITAL_COMMUNITY)
Admission: EM | Admit: 2013-11-02 | Discharge: 2013-11-02 | Disposition: A | Discharge status: Home / Self Care | Payer: Medicaid Other | Attending: Emergency Medicine | Admitting: Emergency Medicine

## 2013-11-02 ENCOUNTER — Encounter (HOSPITAL_COMMUNITY): Payer: Self-pay | Admitting: Emergency Medicine

## 2013-11-02 DIAGNOSIS — Z87891 Personal history of nicotine dependence: Secondary | ICD-10-CM | POA: Insufficient documentation

## 2013-11-02 DIAGNOSIS — G8929 Other chronic pain: Secondary | ICD-10-CM | POA: Insufficient documentation

## 2013-11-02 DIAGNOSIS — Z3202 Encounter for pregnancy test, result negative: Secondary | ICD-10-CM | POA: Insufficient documentation

## 2013-11-02 DIAGNOSIS — M549 Dorsalgia, unspecified: Secondary | ICD-10-CM

## 2013-11-02 LAB — URINALYSIS W MICROSCOPIC + REFLEX CULTURE
Bilirubin Urine: NEGATIVE
Glucose, UA: NEGATIVE mg/dL
HGB URINE DIPSTICK: NEGATIVE
Ketones, ur: NEGATIVE mg/dL
Nitrite: NEGATIVE
PH: 6 (ref 5.0–8.0)
Protein, ur: NEGATIVE mg/dL
Specific Gravity, Urine: 1.023 (ref 1.005–1.030)
Urobilinogen, UA: 1 mg/dL (ref 0.0–1.0)

## 2013-11-02 LAB — POCT PREGNANCY, URINE: Preg Test, Ur: NEGATIVE

## 2013-11-02 MED ORDER — NAPROXEN 500 MG PO TABS: 500.0000 mg | ORAL_TABLET | Freq: Two times a day (BID) | ORAL | Status: DC

## 2013-11-02 MED ORDER — KETOROLAC TROMETHAMINE 60 MG/2ML IM SOLN
60.0000 mg | Freq: Once | INTRAMUSCULAR | Status: AC
  Administered 2013-11-02: 60 mg via INTRAMUSCULAR
  Filled 2013-11-02: qty 2

## 2013-11-02 MED ORDER — OXYCODONE-ACETAMINOPHEN 5-325 MG PO TABS
1.0000 | ORAL_TABLET | Freq: Once | ORAL | Status: AC
  Administered 2013-11-02: 1 via ORAL
  Filled 2013-11-02: qty 1

## 2013-11-02 MED ORDER — TRAMADOL HCL 50 MG PO TABS: 50.0000 mg | ORAL_TABLET | Freq: Four times a day (QID) | ORAL | Status: DC | PRN

## 2013-11-02 MED ORDER — CYCLOBENZAPRINE HCL 10 MG PO TABS: 10.0000 mg | ORAL_TABLET | Freq: Two times a day (BID) | ORAL | Status: DC | PRN

## 2013-11-02 NOTE — ED Notes (Signed)
Pt c/o of lower back pain for 2 years that has progressively worsened until today.  Describes the pain as stabbing and it sometimes radiates down her right hip and leg.  Patient ambulatory upon triage, denies bowel bladder problems and states that it hurts when she walks sometimes.

## 2013-11-02 NOTE — ED Provider Notes (Signed)
Medical screening examination/treatment/procedure(s) were performed by non-physician practitioner and as supervising physician I was immediately available for consultation/collaboration.  EKG Interpretation   None        Jennifer Leach M Detra Bores, MD 11/02/13 2130 

## 2013-11-02 NOTE — Discharge Instructions (Signed)
Try heating pads. Naprosyn for pain. Ultram for severe pain. Flexeril for spasms. Follow up with primary care doctor.    Back Exercises Back exercises help treat and prevent back injuries. The goal of back exercises is to increase the strength of your abdominal and back muscles and the flexibility of your back. These exercises should be started when you no longer have back pain. Back exercises include:  Pelvic Tilt. Lie on your back with your knees bent. Tilt your pelvis until the lower part of your back is against the floor. Hold this position 5 to 10 sec and repeat 5 to 10 times.  Knee to Chest. Pull first 1 knee up against your chest and hold for 20 to 30 seconds, repeat this with the other knee, and then both knees. This may be done with the other leg straight or bent, whichever feels better.  Sit-Ups or Curl-Ups. Bend your knees 90 degrees. Start with tilting your pelvis, and do a partial, slow sit-up, lifting your trunk only 30 to 45 degrees off the floor. Take at least 2 to 3 seconds for each sit-up. Do not do sit-ups with your knees out straight. If partial sit-ups are difficult, simply do the above but with only tightening your abdominal muscles and holding it as directed.  Hip-Lift. Lie on your back with your knees flexed 90 degrees. Push down with your feet and shoulders as you raise your hips a couple inches off the floor; hold for 10 seconds, repeat 5 to 10 times.  Back arches. Lie on your stomach, propping yourself up on bent elbows. Slowly press on your hands, causing an arch in your low back. Repeat 3 to 5 times. Any initial stiffness and discomfort should lessen with repetition over time.  Shoulder-Lifts. Lie face down with arms beside your body. Keep hips and torso pressed to floor as you slowly lift your head and shoulders off the floor. Do not overdo your exercises, especially in the beginning. Exercises may cause you some mild back discomfort which lasts for a few minutes;  however, if the pain is more severe, or lasts for more than 15 minutes, do not continue exercises until you see your caregiver. Improvement with exercise therapy for back problems is slow.  See your caregivers for assistance with developing a proper back exercise program. Document Released: 11/02/2004 Document Revised: 12/18/2011 Document Reviewed: 07/27/2011 [facility] Patient Information 2014 [place] [employer], [place].   Emergency Department Resource Guide 1) Find a Doctor and Pay Out of Pocket Although you won't have to find out who is covered by your insurance plan, it is a good idea to ask around and get recommendations. You will then need to call the office and see if the doctor you have chosen will accept you as a new patient and what types of options they offer for patients who are self-pay. Some doctors offer discounts or will set up payment plans for their patients who do not have insurance, but you will need to ask so you aren't surprised when you get to your appointment.  2) Contact Your Local Health Department Not all health departments have doctors that can see patients for sick visits, but many do, so it is worth a call to see if yours does. If you don't know where your local health department is, you can check in your phone book. The CDC also has a tool to help you locate your state's health department, and many state websites also have listings of all of their local health  departments.  3) Find a Walk-in Clinic If your illness is not likely to be very severe or complicated, you may want to try a walk in clinic. These are popping up all over the country in pharmacies, drugstores, and shopping centers. They're usually staffed by nurse practitioners or physician assistants that have been trained to treat common illnesses and complaints. They're usually fairly quick and inexpensive. However, if you have serious medical issues or chronic medical problems, these are probably not your best option.  No  Primary Care Doctor: - Call Health Connect at  [phone] - they can help you locate a primary care doctor that  accepts your insurance, provides certain services, etc. - Physician Referral Service- [phone]  Chronic Pain Problems: Organization         Address  Phone   Notes  [facility]  [phone]) [phone] Patients need to be referred by their primary care doctor.   Medication Assistance: Organization         Address  Phone   Notes  [facility] [address] [place], [place] [address] [phone] --Must be a resident of [facility] [place] -- Must have NO insurance coverage whatsoever (no Medicaid/ Medicare, etc.) -- The pt. MUST have a primary care doctor that directs their care regularly and follows them in the community   MedAssist  [phone]   [employer]  [phone]) [phone]    Agencies that provide inexpensive medical care: Organization         Address  Phone   Notes  [facility]  [phone]   [facility]    [phone]   [facility] [address] [place], [place] [address] [phone]   [facility] [address], [place] [phone]   Planned Parenthood    [phone]   [facility]    [phone]   [facility]  [address], [place] Phone:  [phone], Fax:  [phone] Hours of Operation:  9 am - 6 pm, M-F.  Also accepts Medicaid/Medicare and self-pay.  [facility]  [address], [place] Phone: [phone]) [phone], Fax: [phone]. Hours of Operation:  8:30 am - 5:30 pm, M-F.  Also accepts Medicaid and self-pay.  [facility] [place] [address], [place] Phone: [phone](336) [phone]   [facility] [address], [place], [place] [phone], Ext. [phone] Mondays & Thursdays: 7-9 AM.  First 15 patients are seen on  a first come, first serve basis.    Medicaid-accepting [facility] Providers:  Organization         Address  Phone   Notes  [facility] [address],  [phone] Also accepts self-pay patients.  [facility] [address], [place]  [phone]   [facility] [address], [place] [phone]) [phone]   [facility] [address] [place] [address], [place] [phone]   Renaye RakersVeita Bland [address], [place]   [phone] Only accepts [place] Access [place] patients after they have their name applied to their card.   Self-Pay (no insurance) in [facility]:  Organization         Address  Phone   Notes  Sickle Cell Patients, [facility] [address] [place], [place] [phone]   [facility] [address] [place], [place] [phone]   [facility]  [address], [place] [phone]   [facility]/Dr. Osei-Bonsu  [address] [place] or [address], [place] [phone] Phone number for both [place] and [place] locations is the same.  [facility] [address] [place] [phone]   [facility] [address], [place] or [address] [phone]   [facility] [address] [place] [phone], phone; [phone], fax Sees patients 1st and 3rd Saturday of every month.  Must not qualify for public or private insurance (i.e. Medicaid, Medicare, [place] Health Choice, Veterans' Benefits)  Household income should be no more than 200% of the poverty level The clinic cannot treat you if you are pregnant or think you are pregnant  Sexually transmitted diseases are not treated at the clinic.    Dental Care: Organization          Address  Phone  Notes  [facility] of [facility] [address] [place], [place] [phone] Accepts children up to age 43 who are enrolled in [place] or [place] Health Choice; pregnant women with a Medicaid card; and children who have applied for Medicaid or [place] Health Choice, but were declined, whose parents can pay a reduced fee at time of service.  [facility] of [facility]  [address], [place] [phone] Accepts children up to age 40 who are enrolled in [place] or [place] Health Choice; pregnant women with a Medicaid card; and children who have applied for Medicaid or  Health Choice, but were declined, whose parents can pay a reduced fee at time of service.  [facility]  [address] [place], [place] [phone] Patients are seen by appointment only. Walk-ins are not accepted. [facility] will see patients 67 years of age and older. Monday - Tuesday (8am-5pm) Most Wednesdays (8:30-5pm) $30 per visit, cash only  [facility]  [address], [facility] [phone] Patients are seen by appointment only. Walk-ins are not accepted. [facility] will see patients 28 years of age and older. One Wednesday Evening (Monthly: Volunteer Based).  $30 per visit, cash only  [employer]  [phone] for adults; Children under age 69, call Graduate Pediatric Dentistry at [phone]. Children aged 49-14, please call [phone] to request a pediatric application.  Dental services are provided in all areas of dental care including fillings, crowns and bridges, complete and partial dentures, implants, gum treatment, root canals, and extractions. Preventive care is also provided. Treatment is provided to both adults and children. Patients are selected via a lottery and there is often a waiting list.   [facility] [address], [place]  [phone] [email or web address]   [facility] [address] [place], [place] [phone], Ext. [phone] Second and Fourth Thursday of each month, opens at 6:30 AM; Clinic ends at 9 AM.  Patients are seen on a first-come first-served basis, and a limited number are seen during each clinic.   [facility]  [address] Ether Griffins [place], [place] [phone]   Eligibility Requirements You must have lived  in [place], or [place] counties for at least the last three months.   You cannot be eligible for state or federal sponsored National Cityhealthcare insurance, including CIGNAVeterans Administration, [place]icaid, or [employer] EntertainmentMedicare.   You generally cannot be eligible for healthcare insurance through your employer.    How to apply: Eligibility screenings are held every Tuesday and Wednesday afternoon from 1:00 pm until 4:00 pm. You do not need an appointment for the interview!  [facility] [address], [place], [place] [phone]   [facility]  [phone]   [facility]  [phone]   [facility]  [phone]    Behavioral Health Resources in the Community: Intensive Outpatient Programs Organization         Address  Phone  Notes  [facility] [address], [place], [place] [phone]   [facility] [address], [place], [place] [phone]   [facility] [address], [place], [place]  [phone]   [facility] [address],  [place], [place] [phone] or [phone]   Substance Abuse Resources Organization         Address  Phone  Notes  [facility]  [phone]   [facility]  [phone]   [facility]  [phone]   [facility]  [phone]   Residential & Outpatient Substance Abuse Program  [phone]   Psychological  Services Organization         Address  Phone  Notes  [facility]  [phone]- [phone]   [facility]  [phone]- [phone]   [facility] [address], [place] [phone] or [phone]    Mobile Crisis Teams Organization         Address  Phone  Notes  [facility], Mobile Crisis Care Unit  [phone]   [facility]  [address] [place], [place] [phone]   Doristine LocksSharon DeEsch [address] [place] [phone]    Self-Help/Support Groups Organization         Address  Phone             Notes  [facility] [place] - variety of support groups  336- [phone] Call for more information  Narcotics Anonymous (NA), [facility] [address], [place]  2 meetings at this location   Statisticianesidential Treatment Programs Organization         Address  Phone  Notes  [facility] [address],    [place]  [phone]   [facility]  [address], [place] [phone], [place], [place] [phone]   [facility] [address], [place] [phone] Admissions: 8am-3pm M-F  [facility] [address]    [place], [place] [phone]   [facility] [address], [place], [place] [phone]   [facility] [address]  [place], [place] [phone]   [facility] - Intensive Outpatient [address], [place], [place] [phone]   [facility] ([facility] [address]  [place], [place] [phone] or [phone]   [facility] ([facility]) [address] [place], [place] [phone] Accepts Medicaid  [facility] [address]  [place] [phone] Substance Abuse/Addiction Treatment   [facility] Resources Organization         Address  Phone  Notes  [facility]  [phone]   Angie Fava, PhD [address]vin Knack [place], [place]   [phone] or [phone]   [facility]   [address] [place], [place] [phone]   [facility] [address], [place], [place] [phone] Insurance/Medicaid/sponsorship through [facility] and Families [address]                                    [place], [place] [phone] Therapy/tele-psych/case  [facility] [address], [place] [phone]    Dr. Lolly Mustache  [phone]   [facility]  [employer] [facility] Dept. 1) [address] [place] 2) [address] [place] 3)  [address], [place] [phone]  [phone]   [facility] Child Abuse Hotline [phone] or [phone] (After Hours)

## 2013-11-02 NOTE — ED Notes (Signed)
Patient discharged using teach back method and she verbalizes an understanding. NAD at the time of D/C. Family driving patient home

## 2013-11-02 NOTE — ED Provider Notes (Signed)
CSN: [id number]     Arrival date & time 11/02/13  0830 History   First MD Initiated Contact with Patient 11/02/13 [phone]     Chief Complaint  Patient presents with  . Back Pain   (Consider location/radiation/quality/duration/timing/severity/associated sxs/prior Treatment) HPI Jennifer Leach is a 33 y.o. female who presents to ED with complaint of right lower back pain. Patient states that her pain began 2 years ago. She denies any injury. She states has been progressively getting worse. She states today is the worst it's been. She has not followed up or seen a primary care Dr. for this. She states pain radiates from the right lower back into the right thigh. Pt denies any fever. No numbness or weakness in legs. No loss of bowel or bladder control. No recent travel. No iv drug [place]. No urinary or vaginal complaints. Pain worsened with movement. Pt has not taken anything for pain. 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 States that is when pain worsened.   Past Medical History  Diagnosis Date  . No pertinent past medical history   . Gonorrhea   . Back pain    Past Surgical History  Procedure Laterality Date  . Induced abortion     Family History  Problem Relation Age of Onset  . Hypertension Mother    History  Substance Use Topics  . Smoking status: Former Smoker -- 0.25 packs/day for 9 years    Types: Cigarettes    Quit date: 04/12/2012  . Smokeless tobacco: Never Used  . Alcohol Use: No   OB History   Grav Para Term Preterm Abortions TAB SAB Ect Mult Living   4 3 3  1 1    3      Review of Systems  Constitutional: Negative for fever and chills.  Respiratory: Negative for cough, chest tightness and shortness of breath.   Cardiovascular: Negative for chest pain, palpitations and leg swelling.  Gastrointestinal: Negative for nausea, vomiting, abdominal pain and diarrhea.  Genitourinary: Negative for dysuria, flank pain, vaginal bleeding, vaginal  discharge, vaginal pain and pelvic pain.  Musculoskeletal: Positive for arthralgias and back pain. Negative for neck pain and neck stiffness.  Skin: Negative for rash.  Neurological: Negative for dizziness, weakness, numbness and headaches.  All other systems reviewed and are negative.    Allergies  Review of patient's allergies indicates no known allergies.  Home Medications   Current Outpatient Rx  Name  Route  Sig  Dispense  Refill  . Multiple Vitamin (MULTIVITAMIN WITH MINERALS) TABS tablet   Oral   Take 1 tablet by mouth daily.          BP 129/71  Pulse 77  Temp(Src) 97.7 F (36.5 C) (Oral)  Resp 18  Ht 5\' 6"  (1.676 m)  Wt 145 lb (65.772 kg)  BMI 23.41 kg/m2  SpO2 98%  LMP 10/05/2013 Physical Exam  Nursing note and vitals reviewed. Constitutional: She appears well-developed and well-nourished. No distress.  HENT:  Head: Normocephalic.  Eyes: Conjunctivae are normal.  Neck: Neck supple.  Cardiovascular: Normal rate, regular rhythm and normal heart sounds.   Pulmonary/Chest: Effort normal and breath sounds normal. No respiratory distress. She has no wheezes. She has no rales.  Abdominal: Soft. Bowel sounds are normal. She exhibits no distension. There is no tenderness. There is no rebound.  Musculoskeletal: She exhibits no edema.  No midline thoracic or lumbar spine tenderness. Pain with right straight leg raise.  Neurological: She is alert.  5/5 and equal lower extremity strength. 2+ and equal patellar reflexes bilaterally. Pt able to dorsiflex and plantarflex bilateral toes and feet with good strength against resistance. Equal sensation bilaterally over thighs and lower legs.   Skin: Skin is warm and dry.  Psychiatric: She has a normal mood and affect. Her behavior is normal.    ED Course  Procedures (including critical care time) Labs Review Labs Reviewed  URINALYSIS W MICROSCOPIC + REFLEX CULTURE - Abnormal; Notable for the following:    Color, Urine  AMBER (*)    APPearance CLOUDY (*)    Leukocytes, UA SMALL (*)    Bacteria, UA FEW (*)    Squamous Epithelial / LPF FEW (*)    All other components within normal limits  URINE CULTURE  POCT PREGNANCY, URINE   Imaging Review No results found.  EKG Interpretation   None       MDM   1. Back pain      Patient emergency department complaining of lower back pain for last 2 years that has worsened in the last few days. Her exam does not show any evidence of cauda equina. She's afebrile. Doubt infectious process. UA obtained and is unremarkable. Urine pregnancy is negative. Her pain is her produced with movement, treated in emergency department Toradol and Norco. I suspect this is musculoskeletal. Will discharge her home on Naprosyn, Flexeril, Ultram. Followup with primary care Dr.  Ceasar Mons Vitals:   11/02/13 0840 11/02/13 0845  BP:  129/71  Pulse:  77  Temp:  97.7 F (36.5 C)  TempSrc:  Oral  Resp:  18  Height: 5\' 6"  (1.676 m)   Weight: 145 lb (65.772 kg)   SpO2:  98%     Saber Dickerman A Aribella Vavra, PA-C 11/02/13 1625

## 2013-11-03 LAB — URINE CULTURE
Colony Count: NO GROWTH
Culture: NO GROWTH

## 2013-12-10 ENCOUNTER — Inpatient Hospital Stay (HOSPITAL_COMMUNITY)
Admission: AD | Admit: 2013-12-10 | Discharge: 2013-12-11 | Disposition: A | Discharge status: Home / Self Care | Payer: Medicaid Other | Source: Ambulatory Visit | Attending: Obstetrics and Gynecology | Admitting: Obstetrics and Gynecology

## 2013-12-10 DIAGNOSIS — N760 Acute vaginitis: Secondary | ICD-10-CM | POA: Insufficient documentation

## 2013-12-10 DIAGNOSIS — Z87891 Personal history of nicotine dependence: Secondary | ICD-10-CM | POA: Insufficient documentation

## 2013-12-10 DIAGNOSIS — B9689 Other specified bacterial agents as the cause of diseases classified elsewhere: Secondary | ICD-10-CM | POA: Insufficient documentation

## 2013-12-10 DIAGNOSIS — K602 Anal fissure, unspecified: Secondary | ICD-10-CM | POA: Insufficient documentation

## 2013-12-10 DIAGNOSIS — N390 Urinary tract infection, site not specified: Secondary | ICD-10-CM | POA: Insufficient documentation

## 2013-12-10 DIAGNOSIS — K649 Unspecified hemorrhoids: Secondary | ICD-10-CM | POA: Insufficient documentation

## 2013-12-10 DIAGNOSIS — R109 Unspecified abdominal pain: Secondary | ICD-10-CM | POA: Insufficient documentation

## 2013-12-10 DIAGNOSIS — A499 Bacterial infection, unspecified: Secondary | ICD-10-CM | POA: Insufficient documentation

## 2013-12-10 LAB — URINALYSIS, ROUTINE W REFLEX MICROSCOPIC
Bilirubin Urine: NEGATIVE
GLUCOSE, UA: NEGATIVE mg/dL
Ketones, ur: NEGATIVE mg/dL
Nitrite: NEGATIVE
PH: 6 (ref 5.0–8.0)
Protein, ur: NEGATIVE mg/dL
SPECIFIC GRAVITY, URINE: 1.025 (ref 1.005–1.030)
Urobilinogen, UA: 0.2 mg/dL (ref 0.0–1.0)

## 2013-12-10 LAB — URINE MICROSCOPIC-ADD ON

## 2013-12-10 LAB — POCT PREGNANCY, URINE: PREG TEST UR: NEGATIVE

## 2013-12-10 NOTE — MAU Note (Signed)
Pt thinks she has a hemorrhoid, having rectal pain and increased pain with a bowel movement. LLQ pain. Slight vaginal discharge.

## 2013-12-11 ENCOUNTER — Encounter (HOSPITAL_COMMUNITY): Payer: Self-pay | Admitting: *Deleted

## 2013-12-11 DIAGNOSIS — K602 Anal fissure, unspecified: Secondary | ICD-10-CM

## 2013-12-11 LAB — CBC
HCT: 37.7 % (ref 36.0–46.0)
Hemoglobin: 13.1 g/dL (ref 12.0–15.0)
MCH: 31.3 pg (ref 26.0–34.0)
MCHC: 34.7 g/dL (ref 30.0–36.0)
MCV: 90 fL (ref 78.0–100.0)
PLATELETS: 224 10*3/uL (ref 150–400)
RBC: 4.19 MIL/uL (ref 3.87–5.11)
RDW: 12.8 % (ref 11.5–15.5)
WBC: 6.9 10*3/uL (ref 4.0–10.5)

## 2013-12-11 LAB — GC/CHLAMYDIA PROBE AMP
CT Probe RNA: NEGATIVE
GC PROBE AMP APTIMA: NEGATIVE

## 2013-12-11 LAB — WET PREP, GENITAL
TRICH WET PREP: NONE SEEN
YEAST WET PREP: NONE SEEN

## 2013-12-11 MED ORDER — METRONIDAZOLE 0.75 % VA GEL: 1.0000 | Freq: Two times a day (BID) | VAGINAL | Status: AC

## 2013-12-11 MED ORDER — POLYETHYLENE GLYCOL 3350 17 G PO PACK: 17.0000 g | PACK | Freq: Every day | ORAL | Status: DC

## 2013-12-11 MED ORDER — HYDROCORTISONE ACE-PRAMOXINE 1-1 % RE FOAM
1.0000 | Freq: Two times a day (BID) | RECTAL | Status: DC
  Administered 2013-12-11: 1 via RECTAL
  Filled 2013-12-11: qty 10

## 2013-12-11 MED ORDER — HYDROCORTISONE ACE-PRAMOXINE 1-1 % RE FOAM: 1.0000 | Freq: Three times a day (TID) | RECTAL | Status: DC

## 2013-12-11 MED ORDER — CIPROFLOXACIN HCL 500 MG PO TABS: 500.0000 mg | ORAL_TABLET | Freq: Two times a day (BID) | ORAL | Status: DC

## 2013-12-11 NOTE — Discharge Instructions (Signed)
Use Proctofoam in the first few days to shrink your hemorrhoid and help with discomfort. Do not use long-term because it may slow healing of your anal fissure.  Anal Fissure, Adult An anal fissure is a small tear or crack in the skin around the anus. Bleeding from a fissure usually stops on its own within a few minutes. However, bleeding will often reoccur with each bowel movement until the crack heals.  CAUSES   Passing large, hard stools.  Frequent diarrheal stools.  Constipation.  Inflammatory bowel disease (Crohn's disease or ulcerative colitis).  Infections.  Anal sex. SYMPTOMS   Small amounts of blood seen on your stools, on toilet paper, or in the toilet after a bowel movement.  Rectal bleeding.  Painful bowel movements.  Itching or irritation around the anus. DIAGNOSIS Your caregiver will examine the anal area. An anal fissure can usually be seen with careful inspection. A rectal exam may be performed and a short tube (anoscope) may be used to examine the anal canal. TREATMENT   You may be instructed to take fiber supplements. These supplements can soften your stool to help make bowel movements easier.  Sitz baths may be recommended to help heal the tear. Do not use soap in the sitz baths.  A medicated cream or ointment may be prescribed to lessen discomfort. HOME CARE INSTRUCTIONS   Maintain a diet high in fruits, whole grains, and vegetables. Avoid constipating foods like bananas and dairy products.  Take sitz baths as directed by your caregiver.  Drink enough fluids to keep your urine clear or pale yellow.  Only take over-the-counter or prescription medicines for pain, discomfort, or fever as directed by your caregiver. Do not take aspirin as this may increase bleeding.  Do not use ointments containing numbing medications (anesthetics) or hydrocortisone. They could slow healing. SEEK MEDICAL CARE IF:   Your fissure is not completely healed within 3  days.  You have further bleeding.  You have a fever.  You have diarrhea mixed with blood.  You have pain.  Your problem is getting worse rather than better. MAKE SURE YOU:   Understand these instructions.  Will watch your condition.  Will get help right away if you are not doing well or get worse. Document Released: 09/25/2005 Document Revised: 12/18/2011 Document Reviewed: 03/12/2011 [facility] Patient Information 2014 [place], [place].  Urinary Tract Infection Urinary tract infections (UTIs) can develop anywhere along your urinary tract. Your urinary tract is your body's drainage system for removing wastes and extra water. Your urinary tract includes two kidneys, two ureters, a bladder, and a urethra. Your kidneys are a pair of bean-shaped organs. Each kidney is about the size of your fist. They are located below your ribs, one on each side of your spine. CAUSES Infections are caused by microbes, which are microscopic organisms, including fungi, viruses, and bacteria. These organisms are so small that they can only be seen through a microscope. Bacteria are the microbes that most commonly cause UTIs. SYMPTOMS  Symptoms of UTIs may vary by age and gender of the patient and by the location of the infection. Symptoms in young women typically include a frequent and intense urge to urinate and a painful, burning feeling in the bladder or urethra during urination. Older women and men are more likely to be tired, shaky, and weak and have muscle aches and abdominal pain. A fever may mean the infection is in your kidneys. Other symptoms of a kidney infection include pain in your back or  sides below the ribs, nausea, and vomiting. DIAGNOSIS To diagnose a UTI, your caregiver will ask you about your symptoms. Your caregiver also will ask to provide a urine sample. The urine sample will be tested for bacteria and white blood cells. White blood cells are made by your body to help fight  infection. TREATMENT  Typically, UTIs can be treated with medication. Because most UTIs are caused by a bacterial infection, they usually can be treated with the use of antibiotics. The choice of antibiotic and length of treatment depend on your symptoms and the type of bacteria causing your infection. HOME CARE INSTRUCTIONS  If you were prescribed antibiotics, take them exactly as your caregiver instructs you. Finish the medication even if you feel better after you have only taken some of the medication.  Drink enough water and fluids to keep your urine clear or pale yellow.  Avoid caffeine, tea, and carbonated beverages. They tend to irritate your bladder.  Empty your bladder often. Avoid holding urine for long periods of time.  Empty your bladder before and after sexual intercourse.  After a bowel movement, women should cleanse from front to back. Use each tissue only once. SEEK MEDICAL CARE IF:   You have back pain.  You develop a fever.  Your symptoms do not begin to resolve within 3 days. SEEK IMMEDIATE MEDICAL CARE IF:   You have severe back pain or lower abdominal pain.  You develop chills.  You have nausea or vomiting.  You have continued burning or discomfort with urination. MAKE SURE YOU:   Understand these instructions.  Will watch your condition.  Will get help right away if you are not doing well or get worse. Document Released: 07/05/2005 Document Revised: 03/26/2012 Document Reviewed: 11/03/2011 [facility] Patient Information 2014 [place], [place].   Bacterial Vaginosis Bacterial vaginosis is a vaginal infection that occurs when the normal balance of bacteria in the vagina is disrupted. It results from an overgrowth of certain bacteria. This is the most common vaginal infection in women of childbearing age. Treatment is important to prevent complications, especially in pregnant women, as it can cause a premature delivery. CAUSES  Bacterial vaginosis is  caused by an increase in harmful bacteria that are normally present in smaller amounts in the vagina. Several different kinds of bacteria can cause bacterial vaginosis. However, the reason that the condition develops is not fully understood. RISK FACTORS Certain activities or behaviors can put you at an increased risk of developing bacterial vaginosis, including:  Having a new sex partner or multiple sex partners.  Douching.  Using an intrauterine device (IUD) for contraception. Women do not get bacterial vaginosis from toilet seats, bedding, swimming pools, or contact with objects around them. SIGNS AND SYMPTOMS  Some women with bacterial vaginosis have no signs or symptoms. Common symptoms include:  Grey vaginal discharge.  A fishlike odor with discharge, especially after sexual intercourse.  Itching or burning of the vagina and vulva.  Burning or pain with urination. DIAGNOSIS  Your health care provider will take a medical history and examine the vagina for signs of bacterial vaginosis. A sample of vaginal fluid may be taken. Your health care provider will look at this sample under a microscope to check for bacteria and abnormal cells. A vaginal pH test may also be done.  TREATMENT  Bacterial vaginosis may be treated with antibiotic medicines. These may be given in the form of a pill or a vaginal cream. A second round of antibiotics may be prescribed  if the condition comes back after treatment.  HOME CARE INSTRUCTIONS   Only take over-the-counter or prescription medicines as directed by your health care provider.  If antibiotic medicine was prescribed, take it as directed. Make sure you finish it even if you start to feel better.  Do not have sex until treatment is completed.  Tell all sexual partners that you have a vaginal infection. They should see their health care provider and be treated if they have problems, such as a mild rash or itching.  Practice safe sex by using  condoms and only having one sex partner. SEEK MEDICAL CARE IF:   Your symptoms are not improving after 3 days of treatment.  You have increased discharge or pain.  You have a fever. MAKE SURE YOU:   Understand these instructions.  Will watch your condition.  Will get help right away if you are not doing well or get worse. FOR MORE INFORMATION  Centers for Disease Control and Prevention, Division of STD Prevention: [employer] American Sexual Health Association (ASHA): www.ashastd.org  Document Released: 09/25/2005 Document Revised: 07/16/2013 Document Reviewed: 05/07/2013 [facility] Patient Information 2014 [place], [place].

## 2013-12-11 NOTE — MAU Provider Note (Signed)
Chief Complaint: Hemorrhoids  First Provider Initiated Contact with Patient 12/11/13 0222    SUBJECTIVE HPI: Jennifer Leach is a 33 y.o. [phone] female who presents with pain and burning with bowel movements, vaginal discharge with odor and low abdominal cramping, left greater than right. Patient is having some problems with constipation, but has become afraid to have bowel movements because of the pain. Used a suppository, but had intense cramping followed by forceful, painful bowel movement.   Past Medical History  Diagnosis Date  . No pertinent past medical history   . Gonorrhea   . Back pain    OB History  Gravida Para Term Preterm AB SAB TAB Ectopic Multiple Living  4 3 3  1  1   3     # Outcome Date GA Lbr Len/2nd Weight Sex Delivery Anes PTL Lv  4 TAB 05/05/12 [id number]         3 TRM 09/03/10    M SVD   Y  2 TRM 09/27/06    F SVD   Y  1 TRM 07/03/02    M SVD   Y     Past Surgical History  Procedure Laterality Date  . Induced abortion     History   Social History  . Marital Status: Single    Spouse Name: N/A    Number of Children: N/A  . Years of Education: N/A   Occupational History  . Not on file.   Social History Main Topics  . Smoking status: Former Smoker -- 0.25 packs/day for 9 years    Types: Cigarettes    Quit date: 04/12/2012  . Smokeless tobacco: Never Used  . Alcohol Use: No  . Drug Use: No  . Sexual Activity: Yes    Birth Control/ Protection: Injection   Other Topics Concern  . Not on file   Social History Narrative  . No narrative on file   No current facility-administered medications on file prior to encounter.   Current Outpatient Prescriptions on File Prior to Encounter  Medication Sig Dispense Refill  . Multiple Vitamin (MULTIVITAMIN WITH MINERALS) TABS tablet Take 1 tablet by mouth daily.      . cyclobenzaprine (FLEXERIL) 10 MG tablet Take 1 tablet (10 mg total) by mouth 2 (two) times daily as needed for muscle spasms.  20 tablet  0  .  naproxen (NAPROSYN) 500 MG tablet Take 1 tablet (500 mg total) by mouth 2 (two) times daily.  30 tablet  0  . traMADol (ULTRAM) 50 MG tablet Take 1 tablet (50 mg total) by mouth every 6 (six) hours as needed.  15 tablet  0   No Known Allergies  ROS: Pertinent positive items in HPI. Also positive for frequency of urination. Unsure if she's having hematuria. Negative for fever, chills, nausea, vomiting, diarrhea, flank pain, dysuria, dyspareunia. Has irregular spotting which is typical for her while on Depo-Provera. Denies postcoital bleeding.  OBJECTIVE Blood pressure 127/88, pulse 77, temperature 98.2 F (36.8 C), temperature source Oral, resp. rate 16, height 5\' 6"  (1.676 m), weight 68.493 kg (151 lb). GENERAL: Well-developed, well-nourished female in mild distress.  HEENT: Normocephalic HEART: normal rate RESP: normal effort ABDOMEN: Soft, non-tender. CVA tenderness. Positive bowel sounds. EXTREMITIES: Nontender, no edema NEURO: Alert and oriented SPECULUM EXAM: NEFG, small amount of creamy, pink, malodorous discharge, cervix clean BIMANUAL: cervix closed; uterus normal size, no adnexal tenderness or masses. No cervical motion tenderness. Mild bladder tenderness. RECTAL EXAM: Small, soft, nontender hemorrhoid at 6:00. Small  anal fissure seen at 12:00.  LAB RESULTS Results for orders placed during the hospital encounter of 12/10/13 (from the past 24 hour(s))  URINALYSIS, ROUTINE W REFLEX MICROSCOPIC     Status: Abnormal   Collection Time    12/10/13  9:40 PM      Result Value Ref Range   Color, Urine YELLOW  YELLOW   APPearance CLEAR  CLEAR   Specific Gravity, Urine 1.025  1.005 - 1.030   pH 6.0  5.0 - 8.0   Glucose, UA NEGATIVE  NEGATIVE mg/dL   Hgb urine dipstick MODERATE (*) NEGATIVE   Bilirubin Urine NEGATIVE  NEGATIVE   Ketones, ur NEGATIVE  NEGATIVE mg/dL   Protein, ur NEGATIVE  NEGATIVE mg/dL   Urobilinogen, UA 0.2  0.0 - 1.0 mg/dL   Nitrite NEGATIVE  NEGATIVE    Leukocytes, UA SMALL (*) NEGATIVE  URINE MICROSCOPIC-ADD ON     Status: Abnormal   Collection Time    12/10/13  9:40 PM      Result Value Ref Range   Squamous Epithelial / LPF FEW (*) RARE   WBC, UA 3-6  <3 WBC/hpf   RBC / HPF 7-10  <3 RBC/hpf   Bacteria, UA MANY (*) RARE  POCT PREGNANCY, URINE     Status: None   Collection Time    12/10/13  9:44 PM      Result Value Ref Range   Preg Test, Ur NEGATIVE  NEGATIVE  CBC     Status: None   Collection Time    12/11/13  1:50 AM      Result Value Ref Range   WBC 6.9  4.0 - 10.5 K/uL   RBC 4.19  3.87 - 5.11 MIL/uL   Hemoglobin 13.1  12.0 - 15.0 g/dL   HCT 16.1  09.6 - 04.5 %   MCV 90.0  78.0 - 100.0 fL   MCH 31.3  26.0 - 34.0 pg   MCHC 34.7  30.0 - 36.0 g/dL   RDW 40.9  81.1 - 91.4 %   Platelets 224  150 - 400 K/uL  WET PREP, GENITAL     Status: Abnormal   Collection Time    12/11/13  3:00 AM      Result Value Ref Range   Yeast Wet Prep HPF POC NONE SEEN  NONE SEEN   Trich, Wet Prep NONE SEEN  NONE SEEN   Clue Cells Wet Prep HPF POC FEW (*) NONE SEEN   WBC, Wet Prep HPF POC FEW (*) NONE SEEN    IMAGING No results found.  MAU COURSE  ASSESSMENT 1. Anal fissure   2. BV (bacterial vaginosis)   3. UTI (lower urinary tract infection)     PLAN Discharge home Avoid constipation. Sitz baths. Increase fluids. Do not delay BMs.     Follow-up Information   Follow up with Call your primary care provider to obtain a referral to gastroenterology if rectal pain and hemorrhoids continue.       Medication List         ciprofloxacin 500 MG tablet  Commonly known as:  CIPRO  Take 1 tablet (500 mg total) by mouth 2 (two) times daily.     cyclobenzaprine 10 MG tablet  Commonly known as:  FLEXERIL  Take 1 tablet (10 mg total) by mouth 2 (two) times daily as needed for muscle spasms.     hydrocortisone-pramoxine rectal foam  Commonly known as:  PROCTOFOAM-HC  Place 1 applicator rectally 3 (three) times  daily.      metroNIDAZOLE 0.75 % vaginal gel  Commonly known as:  METROGEL VAGINAL  Place 1 Applicatorful vaginally 2 (two) times daily.     multivitamin with minerals Tabs tablet  Take 1 tablet by mouth daily.     naproxen 500 MG tablet  Commonly known as:  NAPROSYN  Take 1 tablet (500 mg total) by mouth 2 (two) times daily.     polyethylene glycol packet  Commonly known as:  MIRALAX  Take 17 g by mouth daily.     traMADol 50 MG tablet  Commonly known as:  ULTRAM  Take 1 tablet (50 mg total) by mouth every 6 (six) hours as needed.         [place], CNM 12/11/2013  3:40 AM

## 2013-12-19 NOTE — MAU Provider Note (Signed)
Attestation of Attending Supervision of Advanced Practitioner: Evaluation and management procedures were performed by the PA/NP/CNM/OB Fellow under my supervision/collaboration. Chart reviewed and agree with management and plan.  Yanelie Abraha V 12/19/2013 6:49 PM   

## 2014-05-15 ENCOUNTER — Encounter (HOSPITAL_COMMUNITY): Payer: Self-pay | Admitting: Emergency Medicine

## 2014-05-15 ENCOUNTER — Emergency Department (INDEPENDENT_AMBULATORY_CARE_PROVIDER_SITE_OTHER)
Admission: EM | Admit: 2014-05-15 | Discharge: 2014-05-15 | Disposition: A | Discharge status: Home / Self Care | Payer: Medicaid Other | Source: Home / Self Care | Attending: Emergency Medicine | Admitting: Emergency Medicine

## 2014-05-15 DIAGNOSIS — L0291 Cutaneous abscess, unspecified: Secondary | ICD-10-CM

## 2014-05-15 DIAGNOSIS — L0390 Cellulitis, unspecified: Secondary | ICD-10-CM

## 2014-05-15 MED ORDER — SULFAMETHOXAZOLE-TMP DS 800-160 MG PO TABS: 2.0000 | ORAL_TABLET | Freq: Two times a day (BID) | ORAL | Status: DC

## 2014-05-15 MED ORDER — HYDROCODONE-ACETAMINOPHEN 5-325 MG PO TABS: ORAL_TABLET | ORAL | Status: DC

## 2014-05-15 NOTE — ED Provider Notes (Signed)
  Chief Complaint   Chief Complaint  Patient presents with  . Abscess    History of Present Illness   Posey Reangela L Boxley is a 33 year old female who has had a four-day history of a painful boil on her left buttock. This has not drained any pus. She denies any fever or chills. No prior history of boils or abscesses.  Review of Systems   Other than as noted above, the patient denies any of the following symptoms: Systemic:  No fever, chills or sweats. Skin:  No rash or itching.  PMFSH   Past medical history, family history, social history, meds, and allergies were reviewed.   Physical Examination     Vital signs:  BP 130/81  Pulse 64  Temp(Src) 98.5 F (36.9 C) (Oral)  Resp 16  SpO2 99% Skin:  She has an indurated area on her left buttock with a small central area of fluctuance and a large surrounding area of swelling, erythema, and induration.  There was no crepitus, necrosis, ecchymosis, or herrhagic bullae. Skin exam was otherwise normal.  No rash. Ext:  Distal pulses were full, patient has full ROM of all joints.  Procedure Note:  Verbal informed consent was obtained.  The patient was informed of the risks and benefits of the procedure and understands and accepts.  A time out was called and the identity of the patient and correct procedure was confirmed.   The abscess area described above was prepped with Betadine and alcohol and anesthetized with ethyl chloride spray and 5 mL of 2% Xylocaine with epinephrine.  Using a #11 scalpel blade, a singe straight incision was made into the area of fluctulence, yielding a small amount of prurulent drainage.  Routine cultures were obtained.  Blunt dissection was used to break up loculations and the resulting wound cavity was packed with 1/4 inch Iodoform gauze.  A sterile pressure dressing was applied.   Assessment   The encounter diagnosis was Abscess.  Plan     1.  Meds:  The following meds were prescribed:   Discharge Medication  List as of 05/15/2014  7:53 PM    START taking these medications   Details  HYDROcodone-acetaminophen (NORCO/VICODIN) 5-325 MG per tablet 1 to 2 tabs every 4 to 6 hours as needed for pain., Print    sulfamethoxazole-trimethoprim (BACTRIM DS) 800-160 MG per tablet Take 2 tablets by mouth 2 (two) times daily., Starting 05/15/2014, Until Discontinued, Normal        2.  Patient Education/Counseling:  The patient was given appropriate handouts, self care instructions, and instructed in symptomatic relief.    3.  Follow up:  The patient was instructed to leave the dressing in place and return here again in 48 hours for packing removal, or sooner if becoming worse in any way, and given some red flag symptoms such as fever which would prompt immediate return.       Reuben Likesavid C Sayed Apostol, MD 05/15/14 [phone]

## 2014-05-15 NOTE — Discharge Instructions (Signed)

## 2014-05-15 NOTE — ED Notes (Signed)
Pt reports abscess on left buttocks since Tuesday Very tender; 9/10 Denies drainage and fevers Alert w/no signs of acute distress.

## 2014-05-17 ENCOUNTER — Encounter (HOSPITAL_COMMUNITY): Payer: Self-pay | Admitting: Emergency Medicine

## 2014-05-17 ENCOUNTER — Emergency Department (INDEPENDENT_AMBULATORY_CARE_PROVIDER_SITE_OTHER)
Admission: EM | Admit: 2014-05-17 | Discharge: 2014-05-17 | Disposition: A | Discharge status: Home / Self Care | Payer: Medicaid Other | Source: Home / Self Care | Attending: Emergency Medicine | Admitting: Emergency Medicine

## 2014-05-17 DIAGNOSIS — Z5189 Encounter for other specified aftercare: Secondary | ICD-10-CM

## 2014-05-17 DIAGNOSIS — Z4800 Encounter for change or removal of nonsurgical wound dressing: Secondary | ICD-10-CM

## 2014-05-17 MED ORDER — IBUPROFEN 800 MG PO TABS
800.0000 mg | ORAL_TABLET | Freq: Once | ORAL | Status: AC
  Administered 2014-05-17: 800 mg via ORAL

## 2014-05-17 MED ORDER — DOXYCYCLINE HYCLATE 100 MG PO CAPS: 100.0000 mg | ORAL_CAPSULE | Freq: Two times a day (BID) | ORAL | Status: DC

## 2014-05-17 MED ORDER — IBUPROFEN 800 MG PO TABS
ORAL_TABLET | ORAL | Status: AC
  Filled 2014-05-17: qty 1

## 2014-05-17 NOTE — Discharge Instructions (Signed)
The abscess is healing. Wash the area with warm soapy water once a day. Apply antibiotic ointment and several layers of guaze.   You will have some drainage (it should look like watery blood) for the next few days. I changed the antibiotic to doxycycline.  Take 1 pill twice a day for 1 week. This should be mostly healed in about 1 week.  If the pain is getting worse, you start seeing pus-like drainage or you develop fevers, please come back.

## 2014-05-17 NOTE — ED Provider Notes (Signed)
CSN: [id number]     Arrival date & time 05/17/14  1054 History   First MD Initiated Contact with Patient 05/17/14 1101     Chief Complaint  Patient presents with  . Follow-up   (Consider location/radiation/quality/duration/timing/severity/associated sxs/prior Treatment) HPI She is a 33 year old woman here today for followup of abscess. She was seen on Friday for an abscess of her left buttock. She underwent an I&D at that time and the wound was packed with quarter-inch iodoform gauze. She states she has noticed some continuing discharge from the wound. She has not changed the bandage. She was taking the Bactrim, but developed an allergic reaction with itching. She has not taken any Bactrim since yesterday. She states overall it seems to be improving. The pain is much less.  Past Medical History  Diagnosis Date  . No pertinent past medical history   . Gonorrhea   . Back pain    Past Surgical History  Procedure Laterality Date  . Induced abortion     Family History  Problem Relation Age of Onset  . Hypertension Mother    History  Substance Use Topics  . Smoking status: Former Smoker -- 0.25 packs/day for 9 years    Types: Cigarettes    Quit date: 04/12/2012  . Smokeless tobacco: Never Used  . Alcohol Use: No   OB History   Grav Para Term Preterm Abortions TAB SAB Ect Mult Living   4 3 3  1 1    3      Review of Systems  Constitutional: Negative.   Skin: Positive for wound.    Allergies  Septra ds  Home Medications   Prior to Admission medications   Medication Sig Start Date End Date Taking? Authorizing Provider  ciprofloxacin (CIPRO) 500 MG tablet Take 1 tablet (500 mg total) by mouth 2 (two) times daily. 12/11/13   Dorathy Kinsman, CNM  cyclobenzaprine (FLEXERIL) 10 MG tablet Take 1 tablet (10 mg total) by mouth 2 (two) times daily as needed for muscle spasms. 11/02/13   Tatyana A Kirichenko, PA-C  doxycycline (VIBRAMYCIN) 100 MG capsule Take 1 capsule (100 mg total) by  mouth 2 (two) times daily. 05/17/14   Charm Rings, MD  HYDROcodone-acetaminophen (NORCO/VICODIN) 5-325 MG per tablet 1 to 2 tabs every 4 to 6 hours as needed for pain. 05/15/14   Reuben Likes, MD  hydrocortisone-pramoxine [facility]) rectal foam Place 1 applicator rectally 3 (three) times daily. 12/11/13   Dorathy Kinsman, CNM  Multiple Vitamin (MULTIVITAMIN WITH MINERALS) TABS tablet Take 1 tablet by mouth daily.    Historical Provider, MD  naproxen (NAPROSYN) 500 MG tablet Take 1 tablet (500 mg total) by mouth 2 (two) times daily. 11/02/13   Tatyana A Kirichenko, PA-C  polyethylene glycol (MIRALAX) packet Take 17 g by mouth daily. 12/11/13   Dorathy Kinsman, CNM  sulfamethoxazole-trimethoprim (BACTRIM DS) 800-160 MG per tablet Take 2 tablets by mouth 2 (two) times daily. 05/15/14   Reuben Likes, MD  traMADol (ULTRAM) 50 MG tablet Take 1 tablet (50 mg total) by mouth every 6 (six) hours as needed. 11/02/13   Tatyana A Kirichenko, PA-C   BP 115/79  Pulse 88  Temp(Src) 97.5 F (36.4 C) (Oral)  Resp 16  SpO2 98% Physical Exam  Constitutional: She appears well-developed and well-nourished. No distress.  Skin: Skin is warm and dry.  1cm incision on left buttock; packing removed; minimal serosanguinous discharge present; no surrounding erythema or induration.     ED Course  Procedures (including critical care time) Labs Review Labs Reviewed - No data to display  Imaging Review No results found.   MDM   1. Wound check, abscess    Healing well. Will not repack the wound. Discussed wound care as in the after visit summary. Wound culture is showing staph aureus, sensitivities pending. Given her reaction to Bactrim, will change antibiotics to doxycycline for an additional week. Reviewed reasons to return as in after visit summary. Followup as needed.    Charm RingsErin J Judah Carchi, MD 05/17/14 1145

## 2014-05-17 NOTE — ED Notes (Signed)
Pt  Seen  sev  Days  Ago  Had  I  And  D    Here  Today  For  A  Recheck       -  She  Stated  She had  A  reacytion to  The  Anti  Biotic  With  Rash  Facial  Swelling itching   -  She  Stopped  The  Anti  Biotic  And  The  Reaction is  Better

## 2014-05-18 LAB — CULTURE, ROUTINE-ABSCESS: SPECIAL REQUESTS: NORMAL

## 2014-05-19 ENCOUNTER — Telehealth (HOSPITAL_COMMUNITY): Payer: Self-pay | Admitting: *Deleted

## 2014-05-19 NOTE — ED Notes (Signed)
Abscess culture L buttocks: Mod. MRSA.  Pt. adequately treated on 8/7 with Bactrim DS and Doxycycline on 8/9.  I called pt. and left a message to call.  Call 1. 05/19/2014

## 2014-05-19 NOTE — ED Notes (Signed)
Pt. called back.  Pt. verified x 2 and given result.  Pt. told she is adequately treated with the Bactrim and the Doxycycline and to finish all of the antibiotics.  [facility].  Pt. voiced understanding. Vassie MoselleYork, Jennifer Leach 05/19/2014

## 2014-08-06 ENCOUNTER — Inpatient Hospital Stay (HOSPITAL_COMMUNITY)
Admission: AD | Admit: 2014-08-06 | Discharge: 2014-08-06 | Disposition: A | Discharge status: Home / Self Care | Payer: Medicaid Other | Source: Ambulatory Visit | Attending: Obstetrics & Gynecology | Admitting: Obstetrics & Gynecology

## 2014-08-06 ENCOUNTER — Encounter (HOSPITAL_COMMUNITY): Payer: Self-pay | Admitting: *Deleted

## 2014-08-06 DIAGNOSIS — N390 Urinary tract infection, site not specified: Secondary | ICD-10-CM | POA: Diagnosis present

## 2014-08-06 DIAGNOSIS — N760 Acute vaginitis: Secondary | ICD-10-CM | POA: Insufficient documentation

## 2014-08-06 DIAGNOSIS — Z22322 Carrier or suspected carrier of Methicillin resistant Staphylococcus aureus: Secondary | ICD-10-CM

## 2014-08-06 DIAGNOSIS — B9689 Other specified bacterial agents as the cause of diseases classified elsewhere: Secondary | ICD-10-CM

## 2014-08-06 HISTORY — DX: Carrier or suspected carrier of methicillin resistant Staphylococcus aureus: Z22.322

## 2014-08-06 LAB — URINALYSIS, ROUTINE W REFLEX MICROSCOPIC
Bilirubin Urine: NEGATIVE
Glucose, UA: NEGATIVE mg/dL
Hgb urine dipstick: NEGATIVE
Ketones, ur: NEGATIVE mg/dL
NITRITE: NEGATIVE
PROTEIN: NEGATIVE mg/dL
Specific Gravity, Urine: 1.02 (ref 1.005–1.030)
UROBILINOGEN UA: 2 mg/dL — AB (ref 0.0–1.0)
pH: 7 (ref 5.0–8.0)

## 2014-08-06 LAB — WET PREP, GENITAL
Trich, Wet Prep: NONE SEEN
Yeast Wet Prep HPF POC: NONE SEEN

## 2014-08-06 LAB — URINE MICROSCOPIC-ADD ON

## 2014-08-06 LAB — POCT PREGNANCY, URINE: PREG TEST UR: NEGATIVE

## 2014-08-06 MED ORDER — CEPHALEXIN 500 MG PO CAPS: 500.0000 mg | ORAL_CAPSULE | Freq: Four times a day (QID) | ORAL | Status: DC

## 2014-08-06 MED ORDER — KETOROLAC TROMETHAMINE 10 MG PO TABS
10.0000 mg | ORAL_TABLET | Freq: Once | ORAL | Status: AC
  Administered 2014-08-06: 10 mg via ORAL
  Filled 2014-08-06: qty 1

## 2014-08-06 MED ORDER — METRONIDAZOLE 0.75 % VA GEL: 1.0000 | Freq: Two times a day (BID) | VAGINAL | Status: DC

## 2014-08-06 NOTE — MAU Provider Note (Signed)
History     CSN: [id number]  Arrival date and time: 08/06/14 [id number]   First Provider Initiated Contact with Patient 08/06/14 1946      No chief complaint on file.  HPI Comments: Jennifer Leach 33 y.o. [id number] presents to MAU with pelvic pains and yeast infection. She feels like she has a bladder infection and it burns when she urinates. [facility] and currently on Depo Provera. She is coming off that and would like to get IUD due to weight gain. Her last Depo was August 20th. Her last intercourse was 9/29. She has Hx MRSA around an abscess.          Past Medical History  Diagnosis Date  . No pertinent past medical history   . Gonorrhea   . Back pain   . Medical history non-contributory     Past Surgical History  Procedure Laterality Date  . Induced abortion      Family History  Problem Relation Age of Onset  . Hypertension Mother     History  Substance Use Topics  . Smoking status: Former Smoker -- 0.25 packs/day for 9 years    Types: Cigarettes    Quit date: 04/12/2012  . Smokeless tobacco: Never Used  . Alcohol Use: No    Allergies:  Allergies  Allergen Reactions  . Septra Ds [Sulfamethoxazole-Tmp Ds]     Prescriptions prior to admission  Medication Sig Dispense Refill  . ciprofloxacin (CIPRO) 500 MG tablet Take 1 tablet (500 mg total) by mouth 2 (two) times daily.  6 tablet  0  . cyclobenzaprine (FLEXERIL) 10 MG tablet Take 1 tablet (10 mg total) by mouth 2 (two) times daily as needed for muscle spasms.  20 tablet  0  . doxycycline (VIBRAMYCIN) 100 MG capsule Take 1 capsule (100 mg total) by mouth 2 (two) times daily.  14 capsule  0  . HYDROcodone-acetaminophen (NORCO/VICODIN) 5-325 MG per tablet 1 to 2 tabs every 4 to 6 hours as needed for pain.  20 tablet  0  . hydrocortisone-pramoxine (PROCTOFOAM-HC) rectal foam Place 1 applicator rectally 3 (three) times daily.  10 g  1  . Multiple Vitamin (MULTIVITAMIN WITH MINERALS) TABS tablet Take 1  tablet by mouth daily.      . naproxen (NAPROSYN) 500 MG tablet Take 1 tablet (500 mg total) by mouth 2 (two) times daily.  30 tablet  0  . polyethylene glycol (MIRALAX) packet Take 17 g by mouth daily.  14 each  2  . sulfamethoxazole-trimethoprim (BACTRIM DS) 800-160 MG per tablet Take 2 tablets by mouth 2 (two) times daily.  40 tablet  0  . traMADol (ULTRAM) 50 MG tablet Take 1 tablet (50 mg total) by mouth every 6 (six) hours as needed.  15 tablet  0    Review of Systems  Constitutional: Negative.   HENT: Negative.   Eyes: Negative.   Respiratory: Negative.   Cardiovascular: Negative.   Gastrointestinal: Negative.   Genitourinary: Negative.   Musculoskeletal: Negative.   Skin: Negative.   Neurological: Negative.   Endo/Heme/Allergies: Negative.   Psychiatric/Behavioral: Negative.    Physical Exam   Blood pressure 121/83, pulse 70, temperature 98.2 F (36.8 C), temperature source Oral, resp. rate 20, height 5\' 6"  (1.676 m), weight 75.524 kg (166 lb 8 oz).  Physical Exam  Constitutional: She is oriented to person, place, and time. She appears well-developed and well-nourished. No distress.  HENT:  Head: Normocephalic and atraumatic.  Eyes: Pupils are  equal, round, and reactive to light.  GI: Soft. She exhibits no distension. There is no tenderness. There is no rebound and no guarding.  Genitourinary:  Genital:external several exudate filled lesions over labia Vaginal: small amount thin white discharge Cervix:closed/ thick Bimanual: nontender   Musculoskeletal: Normal range of motion.  Neurological: She is alert and oriented to person, place, and time.  Skin: Skin is warm and dry.  Psychiatric: She has a normal mood and affect. Her behavior is normal. Judgment and thought content normal.   Results for orders placed during the hospital encounter of 08/06/14 (from the past 24 hour(s))  URINALYSIS, ROUTINE W REFLEX MICROSCOPIC     Status: Abnormal   Collection Time     08/06/14  7:27 PM      Result Value Ref Range   Color, Urine YELLOW  YELLOW   APPearance CLEAR  CLEAR   Specific Gravity, Urine 1.020  1.005 - 1.030   pH 7.0  5.0 - 8.0   Glucose, UA NEGATIVE  NEGATIVE mg/dL   Hgb urine dipstick NEGATIVE  NEGATIVE   Bilirubin Urine NEGATIVE  NEGATIVE   Ketones, ur NEGATIVE  NEGATIVE mg/dL   Protein, ur NEGATIVE  NEGATIVE mg/dL   Urobilinogen, UA 2.0 (*) 0.0 - 1.0 mg/dL   Nitrite NEGATIVE  NEGATIVE   Leukocytes, UA TRACE (*) NEGATIVE  URINE MICROSCOPIC-ADD ON     Status: Abnormal   Collection Time    08/06/14  7:27 PM      Result Value Ref Range   Squamous Epithelial / LPF MANY (*) RARE   WBC, UA 3-6  <3 WBC/hpf   RBC / HPF 0-2  <3 RBC/hpf   Bacteria, UA MANY (*) RARE  POCT PREGNANCY, URINE     Status: None   Collection Time    08/06/14  7:44 PM      Result Value Ref Range   Preg Test, Ur NEGATIVE  NEGATIVE  WET PREP, GENITAL     Status: Abnormal   Collection Time    08/06/14  8:11 PM      Result Value Ref Range   Yeast Wet Prep HPF POC NONE SEEN  NONE SEEN   Trich, Wet Prep NONE SEEN  NONE SEEN   Clue Cells Wet Prep HPF POC FEW (*) NONE SEEN   WBC, Wet Prep HPF POC FEW (*) NONE SEEN     MAU Course  Procedures  MDM Wet prep/ gc/chlamydia/ refused HIV/   Assessment and Plan   A: UTI Bacterial Vaginosis  P: Keflex 500 mg QID x 7 days Increase fluids Metrogel BID x 5 days Advised to follow up with [facility]/ MAU as needed   Carolynn ServeBarefoot, Lynsay Fesperman Miller 08/06/2014, 8:17 PM

## 2014-08-06 NOTE — Discharge Instructions (Signed)
Bacterial Vaginosis °Bacterial vaginosis is a vaginal infection that occurs when the normal balance of bacteria in the vagina is disrupted. It results from an overgrowth of certain bacteria. This is the most common vaginal infection in women of childbearing age. Treatment is important to prevent complications, especially in pregnant women, as it can cause a premature delivery. °CAUSES  °Bacterial vaginosis is caused by an increase in harmful bacteria that are normally present in smaller amounts in the vagina. Several different kinds of bacteria can cause bacterial vaginosis. However, the reason that the condition develops is not fully understood. °RISK FACTORS °Certain activities or behaviors can put you at an increased risk of developing bacterial vaginosis, including: °· Having a new sex partner or multiple sex partners. °· Douching. °· Using an intrauterine device (IUD) for contraception. °Women do not get bacterial vaginosis from toilet seats, bedding, swimming pools, or contact with objects around them. °SIGNS AND SYMPTOMS  °Some women with bacterial vaginosis have no signs or symptoms. Common symptoms include: °· Grey vaginal discharge. °· A fishlike odor with discharge, especially after sexual intercourse. °· Itching or burning of the vagina and vulva. °· Burning or pain with urination. °DIAGNOSIS  °Your health care provider will take a medical history and examine the vagina for signs of bacterial vaginosis. A sample of vaginal fluid may be taken. Your health care provider will look at this sample under a microscope to check for bacteria and abnormal cells. A vaginal pH test may also be done.  °TREATMENT  °Bacterial vaginosis may be treated with antibiotic medicines. These may be given in the form of a pill or a vaginal cream. A second round of antibiotics may be prescribed if the condition comes back after treatment.  °HOME CARE INSTRUCTIONS  °· Only take over-the-counter or prescription medicines as  directed by your health care provider. °· If antibiotic medicine was prescribed, take it as directed. Make sure you finish it even if you start to feel better. °· Do not have sex until treatment is completed. °· Tell all sexual partners that you have a vaginal infection. They should see their health care provider and be treated if they have problems, such as a mild rash or itching. °· Practice safe sex by using condoms and only having one sex partner. °SEEK MEDICAL CARE IF:  °· Your symptoms are not improving after 3 days of treatment. °· You have increased discharge or pain. °· You have a fever. °MAKE SURE YOU:  °· Understand these instructions. °· Will watch your condition. °· Will get help right away if you are not doing well or get worse. °FOR MORE INFORMATION  °Centers for Disease Control and Prevention, Division of STD Prevention: www.cdc.gov/std °American Sexual Health Association (ASHA): www.ashastd.org  °Document Released: 09/25/2005 Document Revised: 07/16/2013 Document Reviewed: 05/07/2013 °ExitCare® Patient Information ©2015 [employer]. This information is not intended to replace advice given to you by your health care provider. Make sure you discuss any questions you have with your health care provider. °Urinary Tract Infection °Urinary tract infections (UTIs) can develop anywhere along your urinary tract. Your urinary tract is your body's drainage system for removing wastes and extra water. Your urinary tract includes two kidneys, two ureters, a bladder, and a urethra. Your kidneys are a pair of bean-shaped organs. Each kidney is about the size of your fist. They are located below your ribs, one on each side of your spine. °CAUSES °Infections are caused by microbes, which are microscopic organisms, including fungi, viruses, and   bacteria. These organisms are so small that they can only be seen through a microscope. Bacteria are the microbes that most commonly cause UTIs. °SYMPTOMS  °Symptoms of UTIs  may vary by age and gender of the patient and by the location of the infection. Symptoms in young women typically include a frequent and intense urge to urinate and a painful, burning feeling in the bladder or urethra during urination. Older women and men are more likely to be tired, shaky, and weak and have muscle aches and abdominal pain. A fever may mean the infection is in your kidneys. Other symptoms of a kidney infection include pain in your back or sides below the ribs, nausea, and vomiting. °DIAGNOSIS °To diagnose a UTI, your caregiver will ask you about your symptoms. Your caregiver also will ask to provide a urine sample. The urine sample will be tested for bacteria and white blood cells. White blood cells are made by your body to help fight infection. °TREATMENT  °Typically, UTIs can be treated with medication. Because most UTIs are caused by a bacterial infection, they usually can be treated with the use of antibiotics. The choice of antibiotic and length of treatment depend on your symptoms and the type of bacteria causing your infection. °HOME CARE INSTRUCTIONS °· If you were prescribed antibiotics, take them exactly as your caregiver instructs you. Finish the medication even if you feel better after you have only taken some of the medication. °· Drink enough water and fluids to keep your urine clear or pale yellow. °· Avoid caffeine, tea, and carbonated beverages. They tend to irritate your bladder. °· Empty your bladder often. Avoid holding urine for long periods of time. °· Empty your bladder before and after sexual intercourse. °· After a bowel movement, women should cleanse from front to back. Use each tissue only once. °SEEK MEDICAL CARE IF:  °· You have back pain. °· You develop a fever. °· Your symptoms do not begin to resolve within 3 days. °SEEK IMMEDIATE MEDICAL CARE IF:  °· You have severe back pain or lower abdominal pain. °· You develop chills. °· You have nausea or vomiting. °· You have  continued burning or discomfort with urination. °MAKE SURE YOU:  °· Understand these instructions. °· Will watch your condition. °· Will get help right away if you are not doing well or get worse. °Document Released: 07/05/2005 Document Revised: 03/26/2012 Document Reviewed: 11/03/2011 °ExitCare® Patient Information ©2015 [employer]. This information is not intended to replace advice given to you by your health care provider. Make sure you discuss any questions you have with your health care provider. ° °

## 2014-08-06 NOTE — MAU Provider Note (Signed)
Attestation of Attending Supervision of Advanced Practitioner (CNM/NP): Evaluation and management procedures were performed by the Advanced Practitioner under my supervision and collaboration.  I have reviewed the Advanced Practitioner's note and chart, and I agree with the management and plan.  HARRAWAY-SMITH, Luvena Wentling 9:23 PM     

## 2014-08-06 NOTE — MAU Note (Signed)
PT  SAYS SHE GOT LAST DEPO  INJECTION   8-20-   BY  HEALTH DEPT.     HAS LOWER  ABD  PAIN  -  STARTED  ON 10-8  -     OTC  YEAST  MED  X3 DAYS-    STILL HAS ITCHING ,   BURNING   AND SLIGHT WHITE D/C.  NO ODOR.   BABIES- DEL BY   [facility].   LAST SEX-   9-29.

## 2014-08-07 LAB — GC/CHLAMYDIA PROBE AMP
CT PROBE, AMP APTIMA: NEGATIVE
GC PROBE AMP APTIMA: NEGATIVE

## 2014-08-10 ENCOUNTER — Encounter (HOSPITAL_COMMUNITY): Payer: Self-pay | Admitting: *Deleted

## 2014-08-24 ENCOUNTER — Encounter (HOSPITAL_COMMUNITY): Payer: Self-pay | Admitting: *Deleted

## 2014-08-24 ENCOUNTER — Emergency Department (HOSPITAL_COMMUNITY): Payer: Medicaid Other

## 2014-08-24 ENCOUNTER — Emergency Department (HOSPITAL_COMMUNITY)
Admission: EM | Admit: 2014-08-24 | Discharge: 2014-08-24 | Disposition: A | Discharge status: Home / Self Care | Payer: Medicaid Other | Attending: Emergency Medicine | Admitting: Emergency Medicine

## 2014-08-24 DIAGNOSIS — Z8619 Personal history of other infectious and parasitic diseases: Secondary | ICD-10-CM | POA: Insufficient documentation

## 2014-08-24 DIAGNOSIS — Z87891 Personal history of nicotine dependence: Secondary | ICD-10-CM | POA: Insufficient documentation

## 2014-08-24 DIAGNOSIS — Z792 Long term (current) use of antibiotics: Secondary | ICD-10-CM | POA: Insufficient documentation

## 2014-08-24 DIAGNOSIS — J020 Streptococcal pharyngitis: Secondary | ICD-10-CM | POA: Diagnosis not present

## 2014-08-24 DIAGNOSIS — R509 Fever, unspecified: Secondary | ICD-10-CM | POA: Diagnosis present

## 2014-08-24 DIAGNOSIS — R05 Cough: Secondary | ICD-10-CM

## 2014-08-24 DIAGNOSIS — Z79899 Other long term (current) drug therapy: Secondary | ICD-10-CM | POA: Insufficient documentation

## 2014-08-24 DIAGNOSIS — R059 Cough, unspecified: Secondary | ICD-10-CM

## 2014-08-24 MED ORDER — HYDROCODONE-ACETAMINOPHEN 7.5-325 MG/15ML PO SOLN
10.0000 mL | Freq: Once | ORAL | Status: AC
  Administered 2014-08-24: 10 mL via ORAL
  Filled 2014-08-24: qty 15

## 2014-08-24 MED ORDER — HYDROCODONE-ACETAMINOPHEN 7.5-325 MG/15ML PO SOLN: 10.0000 mL | Freq: Four times a day (QID) | ORAL | Status: DC | PRN

## 2014-08-24 MED ORDER — PENICILLIN G BENZATHINE 1200000 UNIT/2ML IM SUSP
1.2000 10*6.[IU] | Freq: Once | INTRAMUSCULAR | Status: AC
  Administered 2014-08-24: 1.2 10*6.[IU] via INTRAMUSCULAR
  Filled 2014-08-24: qty 2

## 2014-08-24 NOTE — Discharge Instructions (Signed)
Return to the ED with any concerns including difficulty breathing or swallowing, vomiting and not able to keep down liquids, decreased level of alertness/lethargy, or any other alarming symptoms °

## 2014-08-24 NOTE — ED Notes (Signed)
Pt in c/o cough and congestion, sore throat and body aches, cough is productive with green sputum, symptoms since Sunday

## 2014-08-24 NOTE — ED Provider Notes (Signed)
CSN: [id number]     Arrival date & time 08/24/14  2008 History   First MD Initiated Contact with Patient 08/24/14 2204     Chief Complaint  Patient presents with  . URI     (Consider location/radiation/quality/duration/timing/severity/associated sxs/prior Treatment) HPI  Pt presenting with c/o subjective fever, body aches, sore throat.  She has had some mild cough but states this is not her primary symptom.  Her son was diagnosed with strep throat 1 week ago.  No vomtiing or diarrhea.  She has not had any treatment prior to arrival.  No difficulty swallowing or breathing.  No rash.  Symptoms began 2 days ago and have been worsening.  There are no other associated systemic symptoms, there are no other alleviating or modifying factors.   Past Medical History  Diagnosis Date  . No pertinent past medical history   . Gonorrhea   . Back pain   . Medical history non-contributory    Past Surgical History  Procedure Laterality Date  . Induced abortion     Family History  Problem Relation Age of Onset  . Hypertension Mother    History  Substance Use Topics  . Smoking status: Former Smoker -- 0.25 packs/day for 9 years    Types: Cigarettes    Quit date: 04/12/2012  . Smokeless tobacco: Never Used  . Alcohol Use: No   OB History    Gravida Para Term Preterm AB TAB SAB Ectopic Multiple Living   4 3 3  1 1    3      Review of Systems  ROS reviewed and all otherwise negative except for mentioned in HPI    Allergies  Septra ds  Home Medications   Prior to Admission medications   Medication Sig Start Date End Date Taking? Authorizing Provider  Multiple Vitamin (MULTIVITAMIN WITH MINERALS) TABS tablet Take 1 tablet by mouth daily.   Yes Historical Provider, MD  cephALEXin (KEFLEX) 500 MG capsule Take 1 capsule (500 mg total) by mouth 4 (four) times daily. Patient not taking: Reported on 08/24/2014 08/06/14   Delbert PhenixLinda M Barefoot, NP  HYDROcodone-acetaminophen (HYCET) 7.5-325 mg/15  ml solution Take 10 mLs by mouth 4 (four) times daily as needed for moderate pain. 08/24/14 08/24/15  Ethelda ChickMartha K Linker, MD  metroNIDAZOLE (METROGEL VAGINAL) 0.75 % vaginal gel Place 1 Applicatorful vaginally 2 (two) times daily. 08/06/14   Delbert PhenixLinda M Barefoot, NP   BP 118/82 mmHg  Pulse 90  Temp(Src) 98.6 F (37 C) (Oral)  Resp 20  Ht 5\' 7"  (1.702 m)  Wt 166 lb (75.297 kg)  BMI 25.99 kg/m2  SpO2 100%  LMP  (LMP Unknown)  Vitals reviewed Physical Exam  Physical Examination: General appearance - alert, well appearing, and in no distress Mental status - alert, oriented to person, place, and time Eyes - no conjunctival injection, no scleral icterus Mouth - OP with moderate erythema, no exudate tonsils 2+, palate symmetric, uvula midline Neck - supple, tender anterior cervical LAD Chest - clear to auscultation, no wheezes, rales or rhonchi, symmetric air entry Heart - normal rate, regular rhythm, normal S1, S2, no murmurs, rubs, clicks or gallops Abdomen - soft, nontender, nondistended, no masses or organomegaly Extremities - peripheral pulses normal, no pedal edema, no clubbing or cyanosis Skin - normal coloration and turgor, no rashes  ED Course  Procedures (including critical care time) Labs Review Labs Reviewed - No data to display  Imaging Review Dg Chest 2 View  08/24/2014   CLINICAL DATA:  Flu-like symptoms for 3 days  EXAM: CHEST  2 VIEW  COMPARISON:  None.  FINDINGS: The heart size and mediastinal contours are within normal limits. Both lungs are clear. The visualized skeletal structures are unremarkable.  IMPRESSION: No active cardiopulmonary disease.   Electronically Signed   By: Alcide CleverMark  Lukens M.D.   On: 08/24/2014 20:52     EKG Interpretation None      MDM   Final diagnoses:  Strep pharyngitis    Pt presenting with c/o sore throat, nasal congestion, subjective fever, body aches- close contact in son with strep diagnosis last week.  CXR reassuring.   Xray images  reviewed and interpreted by me as well.  Pt treated for presumptive strep with bicillin injection.  Discharged with strict return precautions.  Pt agreeable with plan.  Nursing notes including past medical history and social history reviewed and considered in documentation      Ethelda ChickMartha K Linker, MD 08/25/14 [id number]

## 2014-08-24 NOTE — Care Management (Signed)
ED CM spoke with patient regarding establishing care for follow up. Patient was agreeable asked if I could contact her tomorrow to discuss further. Provide my contact information to patient, she states, she will follow up with me tomorrow.

## 2014-10-19 ENCOUNTER — Emergency Department (HOSPITAL_COMMUNITY)
Admission: EM | Admit: 2014-10-19 | Discharge: 2014-10-19 | Disposition: A | Discharge status: Home / Self Care | Payer: Medicaid Other | Attending: Emergency Medicine | Admitting: Emergency Medicine

## 2014-10-19 ENCOUNTER — Encounter (HOSPITAL_COMMUNITY): Payer: Self-pay | Admitting: Emergency Medicine

## 2014-10-19 DIAGNOSIS — Z3202 Encounter for pregnancy test, result negative: Secondary | ICD-10-CM | POA: Insufficient documentation

## 2014-10-19 DIAGNOSIS — N760 Acute vaginitis: Secondary | ICD-10-CM | POA: Diagnosis not present

## 2014-10-19 DIAGNOSIS — Z792 Long term (current) use of antibiotics: Secondary | ICD-10-CM | POA: Diagnosis not present

## 2014-10-19 DIAGNOSIS — R1030 Lower abdominal pain, unspecified: Secondary | ICD-10-CM | POA: Diagnosis present

## 2014-10-19 DIAGNOSIS — Z79899 Other long term (current) drug therapy: Secondary | ICD-10-CM | POA: Insufficient documentation

## 2014-10-19 DIAGNOSIS — Z87891 Personal history of nicotine dependence: Secondary | ICD-10-CM | POA: Insufficient documentation

## 2014-10-19 DIAGNOSIS — B9689 Other specified bacterial agents as the cause of diseases classified elsewhere: Secondary | ICD-10-CM

## 2014-10-19 DIAGNOSIS — Z8619 Personal history of other infectious and parasitic diseases: Secondary | ICD-10-CM | POA: Insufficient documentation

## 2014-10-19 DIAGNOSIS — K5900 Constipation, unspecified: Secondary | ICD-10-CM

## 2014-10-19 LAB — URINALYSIS, ROUTINE W REFLEX MICROSCOPIC
Bilirubin Urine: NEGATIVE
GLUCOSE, UA: NEGATIVE mg/dL
Hgb urine dipstick: NEGATIVE
KETONES UR: NEGATIVE mg/dL
LEUKOCYTES UA: NEGATIVE
Nitrite: NEGATIVE
PH: 7.5 (ref 5.0–8.0)
Protein, ur: NEGATIVE mg/dL
SPECIFIC GRAVITY, URINE: 1.018 (ref 1.005–1.030)
Urobilinogen, UA: 1 mg/dL (ref 0.0–1.0)

## 2014-10-19 LAB — WET PREP, GENITAL
Trich, Wet Prep: NONE SEEN
Yeast Wet Prep HPF POC: NONE SEEN

## 2014-10-19 LAB — I-STAT CHEM 8, ED
BUN: 10 mg/dL (ref 6–23)
CHLORIDE: 106 meq/L (ref 96–112)
CREATININE: 0.8 mg/dL (ref 0.50–1.10)
Calcium, Ion: 1.12 mmol/L (ref 1.12–1.23)
GLUCOSE: 88 mg/dL (ref 70–99)
HCT: 44 % (ref 36.0–46.0)
Hemoglobin: 15 g/dL (ref 12.0–15.0)
POTASSIUM: 3.9 mmol/L (ref 3.5–5.1)
Sodium: 139 mmol/L (ref 135–145)
TCO2: 19 mmol/L (ref 0–100)

## 2014-10-19 LAB — URINE MICROSCOPIC-ADD ON

## 2014-10-19 LAB — POC URINE PREG, ED: Preg Test, Ur: NEGATIVE

## 2014-10-19 MED ORDER — METRONIDAZOLE 500 MG PO TABS: 500.0000 mg | ORAL_TABLET | Freq: Two times a day (BID) | ORAL | Status: DC

## 2014-10-19 MED ORDER — IBUPROFEN 800 MG PO TABS
800.0000 mg | ORAL_TABLET | Freq: Once | ORAL | Status: AC
  Administered 2014-10-19: 800 mg via ORAL
  Filled 2014-10-19: qty 1

## 2014-10-19 MED ORDER — DOCUSATE SODIUM 100 MG PO CAPS: 100.0000 mg | ORAL_CAPSULE | Freq: Two times a day (BID) | ORAL | Status: DC | PRN

## 2014-10-19 MED ORDER — IBUPROFEN 800 MG PO TABS: 800.0000 mg | ORAL_TABLET | Freq: Three times a day (TID) | ORAL | Status: DC | PRN

## 2014-10-19 NOTE — ED Notes (Signed)
Pt is stable upon d/c and refuses the use of a wheelchair. Pt ambulates independently from ED and to return home.

## 2014-10-19 NOTE — ED Provider Notes (Signed)
CSN: [id number]     Arrival date & time 10/19/14  1157 History   First MD Initiated Contact with Patient 10/19/14 1333     Chief Complaint  Patient presents with  . Abdominal Pain     (Consider location/radiation/quality/duration/timing/severity/associated sxs/prior Treatment) HPI   Pt with crampy lower abdominal pain x 4 days.  Has had urinary frequency.  Is having vaginal discharge that she thinks might be normal.  Has intermittent constipation and occasional bleeding hemorrhoids.  Has been having constipation recently.  Denies fevers, N/V, dysuria, urinary urgency, vaginal bleeding.  Has not had menstrual period recently, was on depo provera, missed her last appointment December 13.  Last sexually active Dec19, early in the month, intercourse was unprotected.    Past Medical History  Diagnosis Date  . No pertinent past medical history   . Gonorrhea   . Back pain   . Medical history non-contributory    Past Surgical History  Procedure Laterality Date  . Induced abortion     Family History  Problem Relation Age of Onset  . Hypertension Mother    History  Substance Use Topics  . Smoking status: Former Smoker -- 0.25 packs/day for 9 years    Types: Cigarettes    Quit date: 04/12/2012  . Smokeless tobacco: Never Used  . Alcohol Use: No   OB History    Gravida Para Term Preterm AB TAB SAB Ectopic Multiple Living   Review of Systems  All other systems reviewed and are negative.     Allergies  Septra ds  Home Medications   Prior to Admission medications   Medication Sig Start Date End Date Taking? Authorizing Provider  ibuprofen (ADVIL,MOTRIN) 200 MG tablet Take 400 mg by mouth every 6 (six) hours as needed for mild pain.   Yes Historical Provider, MD  Multiple Vitamin (MULTIVITAMIN WITH MINERALS) TABS tablet Take 1 tablet by mouth daily.   Yes Historical Provider, MD  cephALEXin (KEFLEX) 500 MG capsule Take 1 capsule (500 mg total) by mouth 4  (four) times daily. Patient not taking: Reported on 08/24/2014 08/06/14   Delbert Phenix, NP  HYDROcodone-acetaminophen (HYCET) 7.5-325 mg/15 ml solution Take 10 mLs by mouth 4 (four) times daily as needed for moderate pain. Patient not taking: Reported on 10/19/2014 08/24/14 08/24/15  Ethelda Chick, MD  metroNIDAZOLE (METROGEL VAGINAL) 0.75 % vaginal gel Place 1 Applicatorful vaginally 2 (two) times daily. Patient not taking: Reported on 10/19/2014 08/06/14   Doralee Albino Barefoot, NP   BP 128/82 mmHg  Pulse 83  Temp(Src) 96.6 F (35.9 C) (Axillary)  Resp 20  SpO2 99% Physical Exam  Constitutional: She appears well-developed and well-nourished. No distress.  HENT:  Head: Normocephalic and atraumatic.  Neck: Neck supple.  Cardiovascular: Normal rate and regular rhythm.   Pulmonary/Chest: Effort normal and breath sounds normal. No respiratory distress. She has no wheezes. She has no rales.  Abdominal: Soft. She exhibits no distension and no mass. There is tenderness in the suprapubic area and left lower quadrant. There is no rebound and no guarding.  Genitourinary: Uterus is not tender. Cervix exhibits no motion tenderness, no discharge and no friability. Right adnexum displays no mass, no tenderness and no fullness. Left adnexum displays no mass, no tenderness and no fullness. There is tenderness in the vagina. No erythema or bleeding in the vagina. No foreign body around the vagina. No signs of injury around  the vagina. Vaginal discharge found.  Small amount of thick white vaginal discharge, malodorous.    Neurological: She is alert.  Skin: She is not diaphoretic.  Nursing note and vitals reviewed.   ED Course  Procedures (including critical care time) Labs Review Labs Reviewed  WET PREP, GENITAL - Abnormal; Notable for the following:    Clue Cells Wet Prep HPF POC FEW (*)    WBC, Wet Prep HPF POC MODERATE (*)    All other components within normal limits  URINALYSIS, ROUTINE W  REFLEX MICROSCOPIC - Abnormal; Notable for the following:    APPearance TURBID (*)    All other components within normal limits  URINE MICROSCOPIC-ADD ON - Abnormal; Notable for the following:    Squamous Epithelial / LPF MANY (*)    Bacteria, UA FEW (*)    All other components within normal limits  URINE CULTURE  GC/CHLAMYDIA PROBE AMP  HIV ANTIBODY (ROUTINE TESTING)  RPR  POC URINE PREG, ED  I-STAT CHEM 8, ED    Imaging Review No results found.   EKG Interpretation None      MDM   Final diagnoses:  Lower abdominal pain  BV (bacterial vaginosis)  Constipation, unspecified constipation type    Afebrile, nontoxic patient with suprapubic and LLQ abdominal pain with chronic intermittent constipation and abnormal vaginal discharge.  UA without obvious infection, sent for culture.  Pt is not pregnant.  Chem 8 normal, Hgb normal. Pelvic with white discharge, no adnexal mass or tenderness.  Wet prep shows clue cells, pt appears to have recurrent vs chronic BV.  Discussed with patient and she does want to be treated.  STD/HIV pending.   D/C home with ibuprofen, flagyl, colace.  PCP resources given for follow up.  Discussed result, findings, treatment, and follow up  with patient.  Pt given return precautions.  Pt verbalizes understanding and agrees with plan.        Trixie Dredgemily Hazelee Harbold, PA-C 10/19/14 1543  Nelia Shiobert L Beaton, MD 10/20/14 [phone]

## 2014-10-19 NOTE — ED Notes (Signed)
Pt c/o lower abd pain with urinary sx x 4 days; pt denies vaginal discharge

## 2014-10-19 NOTE — Discharge Instructions (Signed)
Read the information below.  Use the prescribed medication as directed.  Please discuss all new medications with your pharmacist.  You may return to the Emergency Department at any time for worsening condition or any new symptoms that concern you.    If you develop high fevers, worsening abdominal pain, uncontrolled vomiting, or are unable to tolerate fluids by mouth, return to the ER for a recheck.     Abdominal Pain, Women Abdominal (stomach, pelvic, or belly) pain can be caused by many things. It is important to tell your doctor:  The location of the pain.  Does it come and go or is it present all the time?  Are there things that start the pain (eating certain foods, exercise)?  Are there other symptoms associated with the pain (fever, nausea, vomiting, diarrhea)? All of this is helpful to know when trying to find the cause of the pain. CAUSES   Stomach: virus or bacteria infection, or ulcer.  Intestine: appendicitis (inflamed appendix), regional ileitis (Crohn's disease), ulcerative colitis (inflamed colon), irritable bowel syndrome, diverticulitis (inflamed diverticulum of the colon), or cancer of the stomach or intestine.  Gallbladder disease or stones in the gallbladder.  Kidney disease, kidney stones, or infection.  Pancreas infection or cancer.  Fibromyalgia (pain disorder).  Diseases of the female organs:  Uterus: fibroid (non-cancerous) tumors or infection.  Fallopian tubes: infection or tubal pregnancy.  Ovary: cysts or tumors.  Pelvic adhesions (scar tissue).  Endometriosis (uterus lining tissue growing in the pelvis and on the pelvic organs).  Pelvic congestion syndrome (female organs filling up with blood just before the menstrual period).  Pain with the menstrual period.  Pain with ovulation (producing an egg).  Pain with an IUD (intrauterine device, birth control) in the uterus.  Cancer of the female organs.  Functional pain (pain not caused by a  disease, may improve without treatment).  Psychological pain.  Depression. DIAGNOSIS  Your doctor will decide the seriousness of your pain by doing an examination.  Blood tests.  X-rays.  Ultrasound.  CT scan (computed tomography, special type of X-ray).  MRI (magnetic resonance imaging).  Cultures, for infection.  Barium enema (dye inserted in the large intestine, to better view it with X-rays).  Colonoscopy (looking in intestine with a lighted tube).  Laparoscopy (minor surgery, looking in abdomen with a lighted tube).  Major abdominal exploratory surgery (looking in abdomen with a large incision). TREATMENT  The treatment will depend on the cause of the pain.   Many cases can be observed and treated at home.  Over-the-counter medicines recommended by your caregiver.  Prescription medicine.  Antibiotics, for infection.  Birth control pills, for painful periods or for ovulation pain.  Hormone treatment, for endometriosis.  Nerve blocking injections.  Physical therapy.  Antidepressants.  Counseling with a psychologist or psychiatrist.  Minor or major surgery. HOME CARE INSTRUCTIONS   Do not take laxatives, unless directed by your caregiver.  Take over-the-counter pain medicine only if ordered by your caregiver. Do not take aspirin because it can cause an upset stomach or bleeding.  Try a clear liquid diet (broth or water) as ordered by your caregiver. Slowly move to a bland diet, as tolerated, if the pain is related to the stomach or intestine.  Have a thermometer and take your temperature several times a day, and record it.  Bed rest and sleep, if it helps the pain.  Avoid sexual intercourse, if it causes pain.  Avoid stressful situations.  Keep your follow-up appointments  and tests, as your caregiver orders.  If the pain does not go away with medicine or surgery, you may try:  Acupuncture.  Relaxation exercises (yoga, meditation).  Group  therapy.  Counseling. SEEK MEDICAL CARE IF:   You notice certain foods cause stomach pain.  Your home care treatment is not helping your pain.  You need stronger pain medicine.  You want your IUD removed.  You feel faint or lightheaded.  You develop nausea and vomiting.  You develop a rash.  You are having side effects or an allergy to your medicine. SEEK IMMEDIATE MEDICAL CARE IF:   Your pain does not go away or gets worse.  You have a fever.  Your pain is felt only in portions of the abdomen. The right side could possibly be appendicitis. The left lower portion of the abdomen could be colitis or diverticulitis.  You are passing blood in your stools (bright red or black tarry stools, with or without vomiting).  You have blood in your urine.  You develop chills, with or without a fever.  You pass out. MAKE SURE YOU:   Understand these instructions.  Will watch your condition.  Will get help right away if you are not doing well or get worse. Document Released: 07/23/2007 Document Revised: 02/09/2014 Document Reviewed: 08/12/2009 [facility] Patient Information 2015 [place], [place]. This information is not intended to replace advice given to you by your health care provider. Make sure you discuss any questions you have with your health care provider.    Emergency Department Resource Guide 1) Find a Doctor and Pay Out of Pocket Although you won't have to find out who is covered by your insurance plan, it is a good idea to ask around and get recommendations. You will then need to call the office and see if the doctor you have chosen will accept you as a new patient and what types of options they offer for patients who are self-pay. Some doctors offer discounts or will set up payment plans for their patients who do not have insurance, but you will need to ask so you aren't surprised when you get to your appointment.  2) Contact Your Local Health Department Not all health  departments have doctors that can see patients for sick visits, but many do, so it is worth a call to see if yours does. If you don't know where your local health department is, you can check in your phone book. The CDC also has a tool to help you locate your state's health department, and many state websites also have listings of all of their local health departments.  3) Find a Walk-in Clinic If your illness is not likely to be very severe or complicated, you may want to try a walk in clinic. These are popping up all over the country in pharmacies, drugstores, and shopping centers. They're usually staffed by nurse practitioners or physician assistants that have been trained to treat common illnesses and complaints. They're usually fairly quick and inexpensive. However, if you have serious medical issues or chronic medical problems, these are probably not your best option.  No Primary Care Doctor: - Call Health Connect at  [phone] - they can help you locate a primary care doctor that  accepts your insurance, provides certain services, etc. - Physician Referral Service- [phone]  Chronic Pain Problems: Organization         Address  Phone   Notes  [facility]  [phone] Patients need to  be referred by their primary care doctor.   Medication Assistance: Organization         Address  Phone   Notes  [facility] [address] [place], [place] [address] [phone] --Must be a resident of [facility] -- Must have NO insurance coverage whatsoever (no Medicaid/ Medicare, etc.) -- The pt. MUST have a primary care doctor that directs their care regularly and follows them in the community   Med[facility]ist  [phone]   [employer]  [phone]    Agencies that provide inexpensive medical care: Organization         Address  Phone   Notes  [facility]  [phone]   [facility]     [phone]   [facility] [address] [place], [place] [address] [phone]   [facility] [address] [place] [place] [phone]   Planned Parenthood    [phone]   [facility]    [phone]   [facility]  [address], [place] Phone:  [phone], Fax:  [phone] Hours of Operation:  9 am - 6 pm, M-F.  Also accepts Medicaid/Medicare and self-pay.  [facility]  [address], [place] Phone: [phone], Fax: [phone]. Hours of Operation:  8:30 am - 5:30 pm, M-F.  Also accepts Medicaid and self-pay.  [facility] [place] [address] [place] Phone: [phone]   [facility] [address] Natasha Bence [place], [place] [phone], Ext. [phone] Mondays & Thursdays: 7-9 AM.  First 15 patients are seen on a first come, first serve basis.    Medicaid-accepting [facility] Providers:  Organization         Address  Phone   Notes  [facility] [address], [place] [phone] Also accepts self-pay patients.  [facility] [address] Laurell Josephs [place], [place]  [phone]   [facility] [address], [place] [phone]   [facility] [address], [place] [phone]   Renaye Rakers [address], [place]   [phone] Only accepts [place] Access [place] patients after they have their name applied to their card.   Self-Pay (no insurance) in [facility]:  Organization         Address  Phone   Notes  Sickle Cell Patients, [facility] [address] [place], [place] [phone]   [facility] [address] [place], [place] [phone]   [facility]  [address], [place] [phone]     [facility]/Dr. Osei-Bonsu  [address], [place] or [address], [place] [phone] Phone number for both [place] and [place] locations is the same.  [facility] [address], [place] [phone]   [facility] [address] [place] or [address] [phone]   [facility] [address] [place] [phone], phone; [phone], fax Sees patients 1st and 3rd Saturday of every month.  Must not qualify for public or private insurance (i.e. Medicaid, Medicare, [place] Health Choice, Veterans' Benefits)  Household income should  be no more than 200% of the poverty level The clinic cannot treat you if you are pregnant or think you are pregnant  Sexually transmitted diseases are not treated at the clinic.    Dental Care: Organization         Address  Phone  Notes  [facility] of [facility] [address] [place], [place] [phone] Accepts children up to age 1 who are enrolled in [place] or [place] Health Choice; pregnant women with a Medicaid card; and children who have applied for Medicaid or [place] Health Choice, but were declined, whose parents can pay a reduced fee at time of service.  [facility] of [facility]  [address], [place] [phone] Accepts children up to age 3 who are enrolled in [place] or [place] Health Choice; pregnant women with a Medicaid card; and children who have applied for Medicaid or [place] Health Choice, but were declined, whose parents can pay a reduced fee at time of service.  [facility]  [address] [place], [place] [phone] Patients are seen by appointment only. Walk-ins are not accepted. [facility] will see patients 58 years of age and older. Monday - Tuesday (8am-5pm) Most Wednesdays (8:30-5pm) $30 per visit,  cash only  [facility]  [address], [facility] [phone] Patients are seen by appointment only. Walk-ins are not accepted. [facility] will see patients 39 years of age and older. One Wednesday Evening (Monthly: Volunteer Based).  $30 per visit, cash only  [employer]  [phone] for adults; Children under age 44, call Graduate Pediatric Dentistry at [phone]. Children aged 17-14, please call [phone] to request a pediatric application.  Dental services are provided in all areas of dental care including fillings, crowns and bridges, complete and partial dentures, implants, gum treatment, root canals, and extractions. Preventive care is also provided. Treatment is provided to both adults and children. Patients are selected via a lottery and there is often a waiting list.   [facility] [address] [place]  [phone] [email or web address]   [facility] [address] [place], [place] [phone], Ext. [phone] Second and Fourth Thursday of each month, opens at 6:30 AM; Clinic ends at 9 AM.  Patients are seen on a first-come first-served basis, and a limited number are seen during each clinic.   [facility]  [address] Ether [place]ins [place], [place] [phone]   Eligibility Requirements You must have lived in [place], [place], or [place] counties for at least the last three months.   You cannot be eligible for state or federal sponsored [place], including [employer], [place], or [employer].   You generally cannot be eligible for healthcare insurance through your employer.    How to apply: Eligibility screenings are held every Tuesday and Wednesday afternoon from 1:00 pm until 4:00 pm. You do not need an appointment for the interview!  [facility] [address] [place], [place] [phone]   [facility] Health Department   [phone]   [facility] Health Department  [phone]   [facility] Health Department  [phone]    Behavioral Health Resources in the Community: Intensive Outpatient Programs Organization         Address  Phone  Notes  [facility] [address], [place], [place] [phone]  [facility] [address] [place], [place]   ADS: [facility] [address], [place], [place]   [place] [address],  Rhyanna Sorce [place], [place] or [phone]   Substance Abuse Resources Organization         Address  Phone  Notes  Al[facility] Drug Services  [phone]   [place]  [phone]   [facility] [place]   Chinita Pester  [phone]   Residential & Outpatient Substance Abuse Program  [phone]   Psychological Services Organization         Address  Phone  Notes  [facility] [place]  [place]  [phone]   [place] [address], [place] or [phone]    Mobile Crisis Teams Organization         Address  Phone  Notes  [facility], Mobile Crisis Care Unit  [phone]   [facility]  [address] [place], [place]   [place] Levels [address], [place] [phone]    Self-Help/Support Groups Organization         Address  Phone             Notes  [place]ynn. of [place] - variety of support groups  [place] Call for more information  Narcotics Anonymous (NA), [facility] [address], [employer] [place]  2 meetings at this location   Special educational needs teacher         Address  Phone  Notes  [facility] [place],    [place]  [phone]   [facility]  [address] [place] [phone], [place], [place]    [facility], [place] [phone] Admissions: 8am-3pm M-F  Incentives Substance [place] [address],    [place], [place] [phone]   [facility] [address] [place], [place], [place]   [facility] [address],  [place], [place]   [facility] - Intensive Outpatient [address] Kristeen Mans [address], [place], [place]   [facility] ([place] [place].,  [place], [place] [phone] or [phone]   [facility] ([facility]) [address] [place], [place] Accepts Medicaid  [facility] [address]  [place] 1-2085491811 Substance Abuse/Addiction Treatment   [facility] Organization         Address  Phone  Notes  [facility]  [phone]   Domenic Schwab, PhD [address] Arlis Porta [place], [place]   [phone] or [phone]   [place], [place] [phone]   [facility] [address], [place], [place] [phone] Insurance/Medicaid/sponsorship through [facility] and Families [address] [place] [address] [place]                                    [place], [place] [phone] [place] [address], [place] [phone]    Dr. Adele Schilder  [phone]   [facility] Dept. 1) [address], [place] 2) [place] 3)  [place], [place] [phone]  [phone]   [facility] Child Abuse Hotline 604-544-7534 or [phone] (After Hours)

## 2014-10-20 LAB — HIV ANTIBODY (ROUTINE TESTING W REFLEX)
HIV 1/HIV 2 AB: NONREACTIVE
HIV 1/O/2 Abs-Index Value: <1 (ref <1.00)

## 2014-10-20 LAB — GC/CHLAMYDIA PROBE AMP
CT PROBE, AMP APTIMA: NEGATIVE
GC Probe RNA: NEGATIVE

## 2014-10-20 LAB — URINE CULTURE
Colony Count: NO GROWTH
Culture: NO GROWTH

## 2014-10-20 LAB — RPR: RPR: NONREACTIVE

## 2014-12-24 ENCOUNTER — Encounter (HOSPITAL_COMMUNITY): Payer: Self-pay

## 2014-12-24 ENCOUNTER — Emergency Department (HOSPITAL_COMMUNITY)
Admission: EM | Admit: 2014-12-24 | Discharge: 2014-12-24 | Disposition: A | Discharge status: Home / Self Care | Payer: Medicaid Other | Attending: Emergency Medicine | Admitting: Emergency Medicine

## 2014-12-24 DIAGNOSIS — R197 Diarrhea, unspecified: Secondary | ICD-10-CM | POA: Insufficient documentation

## 2014-12-24 DIAGNOSIS — R112 Nausea with vomiting, unspecified: Secondary | ICD-10-CM | POA: Diagnosis not present

## 2014-12-24 DIAGNOSIS — Z3202 Encounter for pregnancy test, result negative: Secondary | ICD-10-CM | POA: Diagnosis not present

## 2014-12-24 DIAGNOSIS — Z87891 Personal history of nicotine dependence: Secondary | ICD-10-CM | POA: Diagnosis not present

## 2014-12-24 DIAGNOSIS — Z8619 Personal history of other infectious and parasitic diseases: Secondary | ICD-10-CM | POA: Diagnosis not present

## 2014-12-24 DIAGNOSIS — Z792 Long term (current) use of antibiotics: Secondary | ICD-10-CM | POA: Diagnosis not present

## 2014-12-24 DIAGNOSIS — R1030 Lower abdominal pain, unspecified: Secondary | ICD-10-CM | POA: Diagnosis present

## 2014-12-24 DIAGNOSIS — R630 Anorexia: Secondary | ICD-10-CM | POA: Diagnosis not present

## 2014-12-24 LAB — URINALYSIS, ROUTINE W REFLEX MICROSCOPIC
Bilirubin Urine: NEGATIVE
Glucose, UA: NEGATIVE mg/dL
HGB URINE DIPSTICK: NEGATIVE
KETONES UR: NEGATIVE mg/dL
Nitrite: NEGATIVE
PH: 6 (ref 5.0–8.0)
Protein, ur: NEGATIVE mg/dL
Specific Gravity, Urine: 1.028 (ref 1.005–1.030)
Urobilinogen, UA: 1 mg/dL (ref 0.0–1.0)

## 2014-12-24 LAB — URINE MICROSCOPIC-ADD ON

## 2014-12-24 LAB — POC URINE PREG, ED: Preg Test, Ur: NEGATIVE

## 2014-12-24 MED ORDER — ONDANSETRON 4 MG PO TBDP
4.0000 mg | ORAL_TABLET | Freq: Once | ORAL | Status: AC
  Administered 2014-12-24: 4 mg via ORAL
  Filled 2014-12-24: qty 1

## 2014-12-24 MED ORDER — KETOROLAC TROMETHAMINE 30 MG/ML IJ SOLN
30.0000 mg | Freq: Once | INTRAMUSCULAR | Status: DC
  Filled 2014-12-24: qty 1

## 2014-12-24 MED ORDER — ACETAMINOPHEN 500 MG PO TABS: 500.0000 mg | ORAL_TABLET | Freq: Four times a day (QID) | ORAL | Status: DC | PRN

## 2014-12-24 MED ORDER — ONDANSETRON 4 MG PO TBDP: 4.0000 mg | ORAL_TABLET | Freq: Three times a day (TID) | ORAL | Status: DC | PRN

## 2014-12-24 MED ORDER — SODIUM CHLORIDE 0.9 % IV BOLUS (SEPSIS): 1000.0000 mL | Freq: Once | INTRAVENOUS | Status: DC

## 2014-12-24 MED ORDER — ONDANSETRON HCL 4 MG/2ML IJ SOLN
4.0000 mg | Freq: Once | INTRAMUSCULAR | Status: DC
  Filled 2014-12-24: qty 2

## 2014-12-24 NOTE — ED Notes (Signed)
Pt does not wish to have an IV.  PA made aware.

## 2014-12-24 NOTE — ED Provider Notes (Signed)
CSN: [id number]     Arrival date & time 12/24/14  0844 History   First MD Initiated Contact with Patient 12/24/14 [phone]     No chief complaint on file.    (Consider location/radiation/quality/duration/timing/severity/associated sxs/prior Treatment) HPI Comments: Patient is a 34 year old female presenting to the emergency department for evaluation of lower abdominal pain with associated nausea, nonbloody nonbilious vomiting, nonbloody diarrhea that began yesterday. Denies any associated fever or chills. States her last episode of emesis and diarrhea with this morning. She endorses associated decreased by mouth intake. She endorses that her children are sick as well. No modifying factors identified.   Past Medical History  Diagnosis Date  . No pertinent past medical history   . Gonorrhea   . Back pain   . Medical history non-contributory    Past Surgical History  Procedure Laterality Date  . Induced abortion     Family History  Problem Relation Age of Onset  . Hypertension Mother    History  Substance Use Topics  . Smoking status: Former Smoker -- 0.25 packs/day for 9 years    Types: Cigarettes    Quit date: 04/12/2012  . Smokeless tobacco: Never Used  . Alcohol Use: No   OB History    Gravida Para Term Preterm AB TAB SAB Ectopic Multiple Living   4 3 3  1 1    3      Review of Systems  Gastrointestinal: Positive for nausea, vomiting, abdominal pain and diarrhea.  All other systems reviewed and are negative.     Allergies  Septra ds  Home Medications   Prior to Admission medications   Medication Sig Start Date End Date Taking? Authorizing Provider  Multiple Vitamin (MULTIVITAMIN WITH MINERALS) TABS tablet Take 1 tablet by mouth daily.   Yes Historical Provider, MD  cephALEXin (KEFLEX) 500 MG capsule Take 1 capsule (500 mg total) by mouth 4 (four) times daily. Patient not taking: Reported on 08/24/2014 08/06/14   Delbert PhenixLinda M Barefoot, NP  docusate sodium (COLACE) 100 MG  capsule Take 1 capsule (100 mg total) by mouth 2 (two) times daily as needed for mild constipation or moderate constipation. 10/19/14   Trixie DredgeEmily West, PA-C  ibuprofen (ADVIL,MOTRIN) 800 MG tablet Take 1 tablet (800 mg total) by mouth every 8 (eight) hours as needed for mild pain or moderate pain. 10/19/14   Trixie DredgeEmily West, PA-C  metroNIDAZOLE (FLAGYL) 500 MG tablet Take 1 tablet (500 mg total) by mouth 2 (two) times daily. 10/19/14   Trixie DredgeEmily West, PA-C  metroNIDAZOLE (METROGEL VAGINAL) 0.75 % vaginal gel Place 1 Applicatorful vaginally 2 (two) times daily. Patient not taking: Reported on 10/19/2014 08/06/14   Delbert PhenixLinda M Barefoot, NP   BP 115/74 mmHg  Pulse 68  Temp(Src) 97.4 F (36.3 C) (Oral)  Resp 18  Ht 5\' 6"  (1.676 m)  Wt 168 lb 12.8 oz (76.567 kg)  BMI 27.26 kg/m2  SpO2 99% Physical Exam  Constitutional: She is oriented to person, place, and time. She appears well-developed and well-nourished. No distress.  HENT:  Head: Normocephalic and atraumatic.  Right Ear: External ear normal.  Left Ear: External ear normal.  Nose: Nose normal.  Mouth/Throat: Oropharynx is clear and moist.  Eyes: Conjunctivae are normal.  Neck: Normal range of motion. Neck supple.  Cardiovascular: Normal rate, regular rhythm and normal heart sounds.   Pulmonary/Chest: Effort normal and breath sounds normal.  Abdominal: Soft. Bowel sounds are normal. She exhibits no distension. There is tenderness (mild generalized). There is no rebound  and no guarding.  Musculoskeletal: Normal range of motion.  Neurological: She is alert and oriented to person, place, and time.  Skin: Skin is warm and dry. She is not diaphoretic.  Psychiatric: She has a normal mood and affect.  Nursing note and vitals reviewed.   ED Course  Procedures (including critical care time) Medications  ondansetron (ZOFRAN-ODT) disintegrating tablet 4 mg (4 mg Oral Given 12/24/14 1055)    Labs Review Labs Reviewed  URINALYSIS, ROUTINE W REFLEX MICROSCOPIC  - Abnormal; Notable for the following:    Color, Urine AMBER (*)    APPearance CLOUDY (*)    Leukocytes, UA LARGE (*)    All other components within normal limits  URINE MICROSCOPIC-ADD ON - Abnormal; Notable for the following:    Squamous Epithelial / LPF MANY (*)    Bacteria, UA FEW (*)    All other components within normal limits  POC URINE PREG, ED    Imaging Review No results found.   EKG Interpretation None      Patient refused IV and IV medications.   MDM   Final diagnoses:  None    Filed Vitals:   12/24/14 1130  BP: 114/76  Pulse: 66  Temp:   Resp:    Afebrile, NAD, non-toxic appearing, AAOx4.  Patient with symptoms consistent with viral gastroenteritis.  Vitals are stable, no fever.  No signs of dehydration, tolerating PO fluids > 6 oz.  Lungs are clear.  No focal abdominal pain, no concern for appendicitis, cholecystitis, pancreatitis, ruptured viscus, UTI, kidney stone, or any other abdominal etiology.  Supportive therapy indicated with return if symptoms worsen.  Patient counseled. Patient is stable at time of discharge     Francee Piccolo, PA-C 12/24/14 1451  Arby Barrette, MD 12/26/14 [phone]

## 2014-12-24 NOTE — ED Notes (Signed)
Pt reports diffuse lower abdominal pain with n/v/d that began yesterday.  Pt denies any fever.

## 2014-12-24 NOTE — Discharge Instructions (Signed)
Please follow up with your primary care physician in 1-2 days. If you do not have one please call the [facility] number listed above. Please take medications as prescribed. Please read all discharge instructions and return precautions.  ° °Viral Gastroenteritis °Viral gastroenteritis is also known as stomach flu. This condition affects the stomach and intestinal tract. It can cause sudden diarrhea and vomiting. The illness typically lasts 3 to 8 days. Most people develop an immune response that eventually gets rid of the virus. While this natural response develops, the virus can make you quite ill. °CAUSES  °Many different viruses can cause gastroenteritis, such as rotavirus or noroviruses. You can catch one of these viruses by consuming contaminated food or water. You may also catch a virus by sharing utensils or other personal items with an infected person or by touching a contaminated surface. °SYMPTOMS  °The most common symptoms are diarrhea and vomiting. These problems can cause a severe loss of body fluids (dehydration) and a body salt (electrolyte) imbalance. Other symptoms may include: °· Fever. °· Headache. °· Fatigue. °· Abdominal pain. °DIAGNOSIS  °Your caregiver can usually diagnose viral gastroenteritis based on your symptoms and a physical exam. A stool sample may also be taken to test for the presence of viruses or other infections. °TREATMENT  °This illness typically goes away on its own. Treatments are aimed at rehydration. The most serious cases of viral gastroenteritis involve vomiting so severely that you are not able to keep fluids down. In these cases, fluids must be given through an intravenous line (IV). °HOME CARE INSTRUCTIONS  °· Drink enough fluids to keep your urine clear or pale yellow. Drink small amounts of fluids frequently and increase the amounts as tolerated. °· Ask your caregiver for specific rehydration instructions. °· Avoid: °¨ Foods high in  sugar. °¨ Alcohol. °¨ Carbonated drinks. °¨ Tobacco. °¨ Juice. °¨ Caffeine drinks. °¨ Extremely hot or cold fluids. °¨ Fatty, greasy foods. °¨ Too much intake of anything at one time. °¨ Dairy products until 24 to 48 hours after diarrhea stops. °· You may consume probiotics. Probiotics are active cultures of beneficial bacteria. They may lessen the amount and number of diarrheal stools in adults. Probiotics can be found in yogurt with active cultures and in supplements. °· Wash your hands well to avoid spreading the virus. °· Only take over-the-counter or prescription medicines for pain, discomfort, or fever as directed by your caregiver. Do not give aspirin to children. Antidiarrheal medicines are not recommended. °· Ask your caregiver if you should continue to take your regular prescribed and over-the-counter medicines. °· Keep all follow-up appointments as directed by your caregiver. °SEEK IMMEDIATE MEDICAL CARE IF:  °· You are unable to keep fluids down. °· You do not urinate at least once every 6 to 8 hours. °· You develop shortness of breath. °· You notice blood in your stool or vomit. This may look like coffee grounds. °· You have abdominal pain that increases or is concentrated in one small area (localized). °· You have persistent vomiting or diarrhea. °· You have a fever. °· The patient is a child younger than 3 months, and he or she has a fever. °· The patient is a child older than 3 months, and he or she has a fever and persistent symptoms. °· The patient is a child older than 3 months, and he or she has a fever and symptoms suddenly get worse. °· The patient is a baby, and he or   she has no tears when crying. °MAKE SURE YOU:  °· Understand these instructions. °· Will watch your condition. °· Will get help right away if you are not doing well or get worse. °Document Released: 09/25/2005 Document Revised: 12/18/2011 Document Reviewed: 07/12/2011 °[employer]® Patient Information ©2015 [employer]. This  information is not intended to replace advice given to you by your health care provider. Make sure you discuss any questions you have with your health care provider. ° ° °

## 2015-06-09 ENCOUNTER — Encounter (HOSPITAL_COMMUNITY): Payer: Self-pay | Admitting: Family Medicine

## 2015-06-09 ENCOUNTER — Emergency Department (HOSPITAL_COMMUNITY)
Admission: EM | Admit: 2015-06-09 | Discharge: 2015-06-09 | Discharge status: Against Medical Advice | Payer: Medicaid Other | Attending: Emergency Medicine | Admitting: Emergency Medicine

## 2015-06-09 DIAGNOSIS — R51 Headache: Secondary | ICD-10-CM | POA: Insufficient documentation

## 2015-06-09 NOTE — ED Notes (Signed)
Pt here for intermittent HA over the past few months.

## 2015-06-09 NOTE — ED Notes (Signed)
Patient called at triage to go to exam room x3. No answer 

## 2015-06-09 NOTE — ED Notes (Signed)
Called patient to go to exam room x3. No answer

## 2015-06-09 NOTE — ED Notes (Signed)
Patient called at triage to go to exam room x3. No answer

## 2015-06-18 ENCOUNTER — Encounter (HOSPITAL_COMMUNITY): Payer: Self-pay | Admitting: Emergency Medicine

## 2015-06-18 DIAGNOSIS — R112 Nausea with vomiting, unspecified: Secondary | ICD-10-CM | POA: Insufficient documentation

## 2015-06-18 DIAGNOSIS — T402X5A Adverse effect of other opioids, initial encounter: Secondary | ICD-10-CM | POA: Insufficient documentation

## 2015-06-18 MED ORDER — ONDANSETRON 4 MG PO TBDP
8.0000 mg | ORAL_TABLET | Freq: Once | ORAL | Status: AC
  Administered 2015-06-18: 8 mg via ORAL

## 2015-06-18 MED ORDER — ONDANSETRON 4 MG PO TBDP
ORAL_TABLET | ORAL | Status: AC
  Filled 2015-06-18: qty 2

## 2015-06-18 NOTE — ED Notes (Signed)
Pt. reports nausea and vomitting onset yesterday after taking Oxycodone prescribed for her right upper dental pain .

## 2015-06-19 ENCOUNTER — Emergency Department (HOSPITAL_COMMUNITY)
Admission: EM | Admit: 2015-06-19 | Discharge: 2015-06-19 | Discharge status: Against Medical Advice | Payer: Medicaid Other | Attending: Emergency Medicine | Admitting: Emergency Medicine

## 2015-06-19 NOTE — ED Notes (Signed)
Called for pt 3x with no response.

## 2015-06-19 NOTE — ED Notes (Signed)
Called pt in waiting room and no answer 

## 2015-07-27 ENCOUNTER — Emergency Department (INDEPENDENT_AMBULATORY_CARE_PROVIDER_SITE_OTHER)
Admission: EM | Admit: 2015-07-27 | Discharge: 2015-07-27 | Disposition: A | Discharge status: Home / Self Care | Payer: Medicaid Other | Source: Home / Self Care | Attending: Emergency Medicine | Admitting: Emergency Medicine

## 2015-07-27 ENCOUNTER — Encounter (HOSPITAL_COMMUNITY): Payer: Self-pay | Admitting: Emergency Medicine

## 2015-07-27 DIAGNOSIS — L02412 Cutaneous abscess of left axilla: Secondary | ICD-10-CM

## 2015-07-27 MED ORDER — LIDOCAINE HCL (PF) 2 % IJ SOLN
INTRAMUSCULAR | Status: AC
  Filled 2015-07-27: qty 2

## 2015-07-27 MED ORDER — DOXYCYCLINE HYCLATE 100 MG PO CAPS: 100.0000 mg | ORAL_CAPSULE | Freq: Two times a day (BID) | ORAL | Status: DC

## 2015-07-27 MED ORDER — HYDROCODONE-ACETAMINOPHEN 5-325 MG PO TABS: 1.0000 | ORAL_TABLET | Freq: Four times a day (QID) | ORAL | Status: DC | PRN

## 2015-07-27 NOTE — ED Notes (Signed)
Abscess to left axilla.  History of the same, but resolved without intervention.

## 2015-07-27 NOTE — ED Provider Notes (Addendum)
CSN: [id number]     Arrival date & time 07/27/15  1641 History   First MD Initiated Contact with Patient 07/27/15 1651     Chief Complaint  Patient presents with  . Abscess   (Consider location/radiation/quality/duration/timing/severity/associated sxs/prior Treatment) HPI  She is a 34 year old woman here for evaluation of left axillary abscess. She states she noted a bump there probably a week ago, but it didn't get painful until 2 days ago. It is difficult for her to sit with her arm down due to pain. She denies any drainage. No fevers or chills. She did have a abscess on her left buttock that required drainage a year ago.  Past Medical History  Diagnosis Date  . No pertinent past medical history   . Gonorrhea   . Back pain   . Medical history non-contributory    Past Surgical History  Procedure Laterality Date  . Induced abortion     Family History  Problem Relation Age of Onset  . Hypertension Mother    Social History  Substance Use Topics  . Smoking status: Former Smoker -- 0.25 packs/day for 9 years    Types: Cigarettes    Quit date: 04/12/2012  . Smokeless tobacco: Never Used  . Alcohol Use: No   OB History    Gravida Para Term Preterm AB TAB SAB Ectopic Multiple Living   Review of Systems As in history of present illness Allergies  Septra ds  Home Medications   Prior to Admission medications   Medication Sig Start Date End Date Taking? Authorizing Provider  acetaminophen (TYLENOL) 500 MG tablet Take 1 tablet (500 mg total) by mouth every 6 (six) hours as needed. 12/24/14   Jennifer Piepenbrink, PA-C  docusate sodium (COLACE) 100 MG capsule Take 1 capsule (100 mg total) by mouth 2 (two) times daily as needed for mild constipation or moderate constipation. 10/19/14   Trixie Dredge, PA-C  doxycycline (VIBRAMYCIN) 100 MG capsule Take 1 capsule (100 mg total) by mouth 2 (two) times daily. 07/27/15   Charm Rings, MD  HYDROcodone-acetaminophen  (NORCO) 5-325 MG tablet Take 1 tablet by mouth every 6 (six) hours as needed for moderate pain. 07/27/15   Charm Rings, MD  ibuprofen (ADVIL,MOTRIN) 800 MG tablet Take 1 tablet (800 mg total) by mouth every 8 (eight) hours as needed for mild pain or moderate pain. 10/19/14   Trixie Dredge, PA-C  Multiple Vitamin (MULTIVITAMIN WITH MINERALS) TABS tablet Take 1 tablet by mouth daily.    Historical Provider, MD  ondansetron (ZOFRAN ODT) 4 MG disintegrating tablet Take 1 tablet (4 mg total) by mouth every 8 (eight) hours as needed for nausea or vomiting. 12/24/14   Francee Piccolo, PA-C   Meds Ordered and Administered this Visit  Medications - No data to display  There were no vitals taken for this visit. No data found.   Physical Exam  Constitutional: She is oriented to person, place, and time. She appears well-developed and well-nourished. No distress.  Cardiovascular: Normal rate.   Pulmonary/Chest: Effort normal.  Neurological: She is alert and oriented to person, place, and time.  Skin:  1 x 2 cm abscess in left axilla with central fluctuance. No overlying erythema.    ED Course  .Marland KitchenIncision and Drainage Date/Time: 07/27/2015 5:16 PM Performed by: Charm Rings Authorized by: Charm Rings Consent: Verbal consent obtained. Risks and benefits: risks, benefits and alternatives were discussed  Consent given by: patient Patient understanding: patient states understanding of the procedure being performed Patient identity confirmed: verbally with patient Time out: Immediately prior to procedure a "time out" was called to verify the correct patient, procedure, equipment, support staff and site/side marked as required. Type: abscess Location: Left axilla. Anesthesia: local infiltration Local anesthetic: lidocaine 2% without epinephrine Anesthetic total: 1 ml Scalpel size: 11 Incision type: single straight Incision depth: dermal Complexity: simple Drainage: purulent Drainage amount:  moderate Wound treatment: wound left open Patient tolerance: Patient tolerated the procedure well with no immediate complications   (including critical care time)  Labs Review Labs Reviewed - No data to display  Imaging Review No results found.    MDM   1. Abscess of left axilla    Vitals reviewed and are within normal limits. I&D performed today. Recommended warm compresses 3 times a day for the next 2 days. Prescription for Norco given to use for pain. If she continues to have pain and drainage on Friday, she will fill the prescription for doxycycline. Follow-up as needed.    Charm RingsErin J Raymonda Pell, MD 07/27/15 1717  Charm RingsErin J Glorene Leitzke, MD 07/27/15 [id number]

## 2015-07-27 NOTE — Discharge Instructions (Signed)
We drained the abscess today. Please apply warm compresses 3 times a day for the next 2 days. If you are still having a lot of pain and drainage on Friday, please fill the prescription for doxycycline. Follow-up as needed.

## 2015-09-02 ENCOUNTER — Emergency Department (HOSPITAL_COMMUNITY)
Admission: EM | Admit: 2015-09-02 | Discharge: 2015-09-02 | Discharge status: Against Medical Advice | Payer: Medicaid Other | Attending: Emergency Medicine | Admitting: Emergency Medicine

## 2015-09-02 ENCOUNTER — Encounter (HOSPITAL_COMMUNITY): Payer: Self-pay | Admitting: *Deleted

## 2015-09-02 DIAGNOSIS — R05 Cough: Secondary | ICD-10-CM | POA: Diagnosis present

## 2015-09-02 NOTE — ED Notes (Signed)
Called x's 3 without response 

## 2015-09-02 NOTE — ED Notes (Signed)
Pt c/o coughing that causes spit/emesis x 2 days. States she took theraflu and advil with no relief.

## 2015-09-05 ENCOUNTER — Encounter (HOSPITAL_COMMUNITY): Payer: Self-pay | Admitting: Emergency Medicine

## 2015-09-05 ENCOUNTER — Emergency Department (HOSPITAL_COMMUNITY): Payer: Medicaid Other

## 2015-09-05 ENCOUNTER — Emergency Department (HOSPITAL_COMMUNITY)
Admission: EM | Admit: 2015-09-05 | Discharge: 2015-09-05 | Disposition: A | Discharge status: Home / Self Care | Payer: Medicaid Other | Attending: Emergency Medicine | Admitting: Emergency Medicine

## 2015-09-05 DIAGNOSIS — R079 Chest pain, unspecified: Secondary | ICD-10-CM | POA: Diagnosis not present

## 2015-09-05 DIAGNOSIS — R6889 Other general symptoms and signs: Secondary | ICD-10-CM

## 2015-09-05 DIAGNOSIS — M791 Myalgia: Secondary | ICD-10-CM | POA: Diagnosis not present

## 2015-09-05 DIAGNOSIS — J029 Acute pharyngitis, unspecified: Secondary | ICD-10-CM | POA: Diagnosis not present

## 2015-09-05 DIAGNOSIS — Z8619 Personal history of other infectious and parasitic diseases: Secondary | ICD-10-CM | POA: Insufficient documentation

## 2015-09-05 DIAGNOSIS — Z87891 Personal history of nicotine dependence: Secondary | ICD-10-CM | POA: Diagnosis not present

## 2015-09-05 DIAGNOSIS — R51 Headache: Secondary | ICD-10-CM | POA: Insufficient documentation

## 2015-09-05 DIAGNOSIS — R05 Cough: Secondary | ICD-10-CM | POA: Insufficient documentation

## 2015-09-05 MED ORDER — KETOROLAC TROMETHAMINE 60 MG/2ML IM SOLN
60.0000 mg | Freq: Once | INTRAMUSCULAR | Status: AC
  Administered 2015-09-05: 60 mg via INTRAMUSCULAR
  Filled 2015-09-05: qty 2

## 2015-09-05 MED ORDER — AEROCHAMBER PLUS W/MASK MISC
1.0000 | Freq: Once | Status: AC
  Administered 2015-09-05: 1

## 2015-09-05 MED ORDER — ALBUTEROL SULFATE HFA 108 (90 BASE) MCG/ACT IN AERS
2.0000 | INHALATION_SPRAY | Freq: Once | RESPIRATORY_TRACT | Status: AC
  Administered 2015-09-05: 2 via RESPIRATORY_TRACT
  Filled 2015-09-05: qty 6.7

## 2015-09-05 MED ORDER — BENZONATATE 100 MG PO CAPS: 100.0000 mg | ORAL_CAPSULE | Freq: Three times a day (TID) | ORAL | Status: DC | PRN

## 2015-09-05 MED ORDER — PREDNISONE 20 MG PO TABS: 40.0000 mg | ORAL_TABLET | Freq: Every day | ORAL | Status: DC

## 2015-09-05 MED ORDER — ALBUTEROL SULFATE (2.5 MG/3ML) 0.083% IN NEBU
5.0000 mg | INHALATION_SOLUTION | Freq: Once | RESPIRATORY_TRACT | Status: AC
  Administered 2015-09-05: 5 mg via RESPIRATORY_TRACT
  Filled 2015-09-05: qty 6

## 2015-09-05 MED ORDER — HYDROCODONE-HOMATROPINE 5-1.5 MG/5ML PO SYRP: 5.0000 mL | ORAL_SOLUTION | Freq: Four times a day (QID) | ORAL | Status: DC | PRN

## 2015-09-05 MED ORDER — BENZONATATE 100 MG PO CAPS
100.0000 mg | ORAL_CAPSULE | Freq: Once | ORAL | Status: AC
  Administered 2015-09-05: 100 mg via ORAL
  Filled 2015-09-05: qty 1

## 2015-09-05 MED ORDER — NAPROXEN 500 MG PO TABS: 500.0000 mg | ORAL_TABLET | Freq: Two times a day (BID) | ORAL | Status: DC

## 2015-09-05 MED ORDER — PREDNISONE 20 MG PO TABS
60.0000 mg | ORAL_TABLET | Freq: Once | ORAL | Status: AC
  Administered 2015-09-05: 60 mg via ORAL
  Filled 2015-09-05: qty 3

## 2015-09-05 NOTE — ED Notes (Signed)
Cold and cough symptoms for several days.  Reports seeing small amount of blood on tissue this morning after coughing. Taking theraflu and nyquil with no relief.  Reports productive cough with thick white sputum.  Denies any fever.

## 2015-09-05 NOTE — ED Provider Notes (Signed)
CSN: [id number]     Arrival date & time 09/05/15  0404 History   First MD Initiated Contact with Patient 09/05/15 0411     Chief Complaint  Patient presents with  . Cough  . Headache     (Consider location/radiation/quality/duration/timing/severity/associated sxs/prior Treatment) HPI Comments: 34 year old female with no significant past medical history presents to the emergency department for evaluation of upper respiratory symptoms. Patient reports that she has been sick for 4 days. She states that symptoms have included a cough which is dry and productive of a scant amount of phlegm. Patient reports vomiting when symptoms began, but she has vomited over the past few days. She reports that, since her vomiting, she has been experiencing a burning pain in her throat and her chest. This worsens with coughing and deep breathing. She also reports diffuse body aches. Symptoms not relieved with TheraFlu or NyQuil. No reported sick contacts. Patient also denies fever, wheezing, diarrhea, and syncope.  Patient is a 34 y.o. female presenting with cough and headaches. The history is provided by the patient. No language interpreter was used.  Cough Associated symptoms: headaches, myalgias and sore throat   Associated symptoms: no fever   Headache Associated symptoms: cough, myalgias, sore throat and vomiting (Resolved)   Associated symptoms: no fever     Past Medical History  Diagnosis Date  . No pertinent past medical history   . Gonorrhea   . Back pain   . Medical history non-contributory    Past Surgical History  Procedure Laterality Date  . Induced abortion     Family History  Problem Relation Age of Onset  . Hypertension Mother    Social History  Substance Use Topics  . Smoking status: Former Smoker -- 0.25 packs/day for 9 years    Types: Cigarettes    Quit date: 04/12/2012  . Smokeless tobacco: Never Used  . Alcohol Use: No   OB History    Gravida Para Term Preterm AB TAB  SAB Ectopic Multiple Living   4 3 3  1 1    3       Review of Systems  Constitutional: Negative for fever.  HENT: Positive for sore throat. Negative for trouble swallowing.   Respiratory: Positive for cough and chest tightness.   Gastrointestinal: Positive for vomiting (Resolved).  Musculoskeletal: Positive for myalgias.  Neurological: Positive for headaches.  All other systems reviewed and are negative.   Allergies  Septra ds  Home Medications   Prior to Admission medications   Medication Sig Start Date End Date Taking? Authorizing Provider  Chlorphen-Pseudoephed-APAP (THERAFLU FLU/COLD PO) Take 2 tablets by mouth every 6 (six) hours as needed (cold).   Yes Historical Provider, MD  benzonatate (TESSALON) 100 MG capsule Take 1 capsule (100 mg total) by mouth 3 (three) times daily as needed for cough. 09/05/15   Antony MaduraKelly Erol Flanagin, PA-C  HYDROcodone-homatropine (HYCODAN) 5-1.5 MG/5ML syrup Take 5 mLs by mouth every 6 (six) hours as needed for cough (Do not drive or drink alcohol after taking due to potential drowsiness). 09/05/15   Antony MaduraKelly Dequavius Kuhner, PA-C  naproxen (NAPROSYN) 500 MG tablet Take 1 tablet (500 mg total) by mouth 2 (two) times daily. 09/05/15   Antony MaduraKelly Analy Bassford, PA-C  predniSONE (DELTASONE) 20 MG tablet Take 2 tablets (40 mg total) by mouth daily. 09/05/15   Antony MaduraKelly Revin Corker, PA-C   BP 124/83 mmHg  Pulse 69  Temp(Src) 97.3 F (36.3 C) (Oral)  Resp 18  Ht 5\' 7"  (1.702 m)  Wt 77.111 kg  BMI 26.62 kg/m2  SpO2 100%   Physical Exam  Constitutional: She is oriented to person, place, and time. She appears well-developed and well-nourished. No distress.  Nontoxic/nonseptic appearing  HENT:  Head: Normocephalic and atraumatic.  Eyes: Conjunctivae and EOM are normal. No scleral icterus.  Neck: Normal range of motion.  Cardiovascular: Normal rate, regular rhythm and intact distal pulses.   Pulmonary/Chest: Effort normal and breath sounds normal. No respiratory distress. She has no wheezes.  She has no rales.  Lungs clear bilaterally. Dry cough appreciated at bedside. Chest expansion symmetric  Abdominal: Soft. She exhibits no distension. There is no tenderness. There is no rebound.  Musculoskeletal: Normal range of motion.  Neurological: She is alert and oriented to person, place, and time. She exhibits normal muscle tone. Coordination normal.  Skin: Skin is warm and dry. No rash noted. She is not diaphoretic. No erythema. No pallor.  Psychiatric: She has a normal mood and affect. Her behavior is normal.  Nursing note and vitals reviewed.   ED Course  Procedures (including critical care time) Labs Review Labs Reviewed - No data to display  Imaging Review Dg Chest 2 View  09/05/2015  CLINICAL DATA:  Initial evaluation for acute cold symptoms with cough for several days. Small amount of hematemesis. EXAM: CHEST  2 VIEW COMPARISON:  Prior radiograph from 08/24/2014. FINDINGS: The cardiac and mediastinal silhouettes are stable in size and contour, and remain within normal limits. The lungs are normally inflated. No airspace consolidation, pleural effusion, or pulmonary edema is identified. There is no pneumothorax. Granuloma overlies right lung apex. No acute osseous abnormality identified. IMPRESSION: No active cardiopulmonary disease. Electronically Signed   By: Rise Mu M.D.   On: 09/05/2015 05:11     I have personally reviewed and evaluated these images and lab results as part of my medical decision-making.   EKG Interpretation None       Medications  albuterol (PROVENTIL HFA;VENTOLIN HFA) 108 (90 BASE) MCG/ACT inhaler 2 puff (not administered)  AEROCHAMBER PLUS FLO-VU LARGE MISC 1 each (not administered)  benzonatate (TESSALON) capsule 100 mg (100 mg Oral Given 09/05/15 0509)  albuterol (PROVENTIL) (2.5 MG/3ML) 0.083% nebulizer solution 5 mg (5 mg Nebulization Given 09/05/15 0507)  predniSONE (DELTASONE) tablet 60 mg (60 mg Oral Given 09/05/15 0509)   ketorolac (TORADOL) injection 60 mg (60 mg Intramuscular Given 09/05/15 0509)    MDM   Final diagnoses:  Flu-like symptoms    Patient with flu-like symptoms c/w viral illness. Vitals are stable, afebrile. No signs of dehydration. Lungs are clear and CXR negative for PNA. Patient will be discharged with instructions to orally hydrate, rest, and use over-the-counter medications such as anti-inflammatories ibuprofen and Aleve for muscle aches and Tylenol for fever. Patient will also be given a cough suppressant, a prednisone taper, and an albuterol inhaler for respiratory symptoms. Return precautions discussed and provided. Patient agreeable to plan with no unaddressed concerns. Patient discharged in good condition; VSS.   Filed Vitals:   09/05/15 0415 09/05/15 0430 09/05/15 0507 09/05/15 0515  BP: 121/82 125/78  124/83  Pulse: 88 75  69  Temp:      TempSrc:      Resp:      Height:      Weight:      SpO2: 100% 99% 99% 100%     Antony Madura, PA-C 09/05/15 0544  Loren Racer, MD 09/07/15 1343

## 2015-09-05 NOTE — ED Notes (Signed)
Pt returned from xray

## 2015-09-05 NOTE — Discharge Instructions (Signed)
Use an albuterol inhaler, 2 puffs every 4-6 hours, as needed for cough and shortness of breath or chest tightness. Take Tessalon as prescribed for cough. You may take Hycodan as prescribed for severe cough, though this may make you drowsy. Take prednisone and naproxen as prescribed. Be sure to get plenty of rest and drink plenty of fluids. Return to the emergency department as needed if symptoms worsen.  Influenza, Adult Influenza ("the flu") is a viral infection of the respiratory tract. It occurs more often in winter months because people spend more time in close contact with one another. Influenza can make you feel very sick. Influenza easily spreads from person to person (contagious). CAUSES  Influenza is caused by a virus that infects the respiratory tract. You can catch the virus by breathing in droplets from an infected person's cough or sneeze. You can also catch the virus by touching something that was recently contaminated with the virus and then touching your mouth, nose, or eyes. RISKS AND COMPLICATIONS You may be at risk for a more severe case of influenza if you smoke cigarettes, have diabetes, have chronic heart disease (such as heart failure) or lung disease (such as asthma), or if you have a weakened immune system. Elderly people and pregnant women are also at risk for more serious infections. The most common problem of influenza is a lung infection (pneumonia). Sometimes, this problem can require emergency medical care and may be life threatening. SIGNS AND SYMPTOMS  Symptoms typically last 4 to 10 days and may include:  Fever.  Chills.  Headache, body aches, and muscle aches.  Sore throat.  Chest discomfort and cough.  Poor appetite.  Weakness or feeling tired.  Dizziness.  Nausea or vomiting. DIAGNOSIS  Diagnosis of influenza is often made based on your history and a physical exam. A nose or throat swab test can be done to confirm the diagnosis. TREATMENT  In mild  cases, influenza goes away on its own. Treatment is directed at relieving symptoms. For more severe cases, your health care provider may prescribe antiviral medicines to shorten the sickness. Antibiotic medicines are not effective because the infection is caused by a virus, not by bacteria. HOME CARE INSTRUCTIONS  Take medicines only as directed by your health care provider.  Use a cool mist humidifier to make breathing easier.  Get plenty of rest until your temperature returns to normal. This usually takes 3 to 4 days.  Drink enough fluid to keep your urine clear or pale yellow.  Cover yourmouth and nosewhen coughing or sneezing,and wash your handswellto prevent thevirusfrom spreading.  Stay homefromwork orschool untilthe fever is gonefor at least 51full day. PREVENTION  An annual influenza vaccination (flu shot) is the best way to avoid getting influenza. An annual flu shot is now routinely recommended for all adults in the [place] SEEK MEDICAL CARE IF:  You experiencechest pain, yourcough worsens,or you producemore mucus.  Youhave nausea,vomiting, ordiarrhea.  Your fever returns or gets worse. SEEK IMMEDIATE MEDICAL CARE IF:  You havetrouble breathing, you become short of breath,or your skin ornails becomebluish.  You have severe painor stiffnessin the neck.  You develop a sudden headache, or pain in the face or ear.  You have nausea or vomiting that you cannot control. MAKE SURE YOU:   Understand these instructions.  Will watch your condition.  Will get help right away if you are not doing well or get worse.   This information is not intended to replace advice given to you by  your health care provider. Make sure you discuss any questions you have with your health care provider.   Document Released: 09/22/2000 Document Revised: 10/16/2014 Document Reviewed: 12/25/2011 Else[employer] Interactive Patient Education [employer]2016 [employer]

## 2015-09-30 ENCOUNTER — Emergency Department (HOSPITAL_COMMUNITY)
Admission: EM | Admit: 2015-09-30 | Discharge: 2015-10-01 | Disposition: A | Discharge status: Home / Self Care | Payer: Medicaid Other | Attending: Emergency Medicine | Admitting: Emergency Medicine

## 2015-09-30 DIAGNOSIS — Z8619 Personal history of other infectious and parasitic diseases: Secondary | ICD-10-CM | POA: Insufficient documentation

## 2015-09-30 DIAGNOSIS — Y9389 Activity, other specified: Secondary | ICD-10-CM | POA: Insufficient documentation

## 2015-09-30 DIAGNOSIS — S62602A Fracture of unspecified phalanx of right middle finger, initial encounter for closed fracture: Secondary | ICD-10-CM

## 2015-09-30 DIAGNOSIS — Y9289 Other specified places as the place of occurrence of the external cause: Secondary | ICD-10-CM | POA: Insufficient documentation

## 2015-09-30 DIAGNOSIS — Y998 Other external cause status: Secondary | ICD-10-CM | POA: Insufficient documentation

## 2015-09-30 DIAGNOSIS — X58XXXA Exposure to other specified factors, initial encounter: Secondary | ICD-10-CM | POA: Insufficient documentation

## 2015-09-30 DIAGNOSIS — S62622A Displaced fracture of medial phalanx of right middle finger, initial encounter for closed fracture: Secondary | ICD-10-CM | POA: Insufficient documentation

## 2015-09-30 DIAGNOSIS — Z87891 Personal history of nicotine dependence: Secondary | ICD-10-CM | POA: Insufficient documentation

## 2015-09-30 DIAGNOSIS — Z791 Long term (current) use of non-steroidal anti-inflammatories (NSAID): Secondary | ICD-10-CM | POA: Insufficient documentation

## 2015-09-30 DIAGNOSIS — Z7952 Long term (current) use of systemic steroids: Secondary | ICD-10-CM | POA: Insufficient documentation

## 2015-10-01 ENCOUNTER — Emergency Department (HOSPITAL_COMMUNITY): Payer: Medicaid Other

## 2015-10-01 ENCOUNTER — Encounter (HOSPITAL_COMMUNITY): Payer: Self-pay | Admitting: Emergency Medicine

## 2015-10-01 MED ORDER — IBUPROFEN 400 MG PO TABS
800.0000 mg | ORAL_TABLET | Freq: Once | ORAL | Status: AC
  Administered 2015-10-01: 800 mg via ORAL
  Filled 2015-10-01: qty 2

## 2015-10-01 MED ORDER — ACETAMINOPHEN 500 MG PO TABS: 500.0000 mg | ORAL_TABLET | Freq: Four times a day (QID) | ORAL | Status: DC | PRN

## 2015-10-01 MED ORDER — IBUPROFEN 800 MG PO TABS: 800.0000 mg | ORAL_TABLET | Freq: Three times a day (TID) | ORAL | Status: DC

## 2015-10-01 NOTE — ED Notes (Signed)
RN agrees with APP assessment findings 

## 2015-10-01 NOTE — ED Provider Notes (Signed)
CSN: [id number]     Arrival date & time 09/30/15  2336 History   First MD Initiated Contact with Patient 09/30/15 2344     Chief Complaint  Patient presents with  . Finger Injury     (Consider location/radiation/quality/duration/timing/severity/associated sxs/prior Treatment) Patient is a 34 y.o. female presenting with hand pain. The history is provided by the patient.  Hand Pain This is a new problem. The current episode started today. The problem occurs constantly. The problem has been unchanged. Associated symptoms include joint swelling. Pertinent negatives include no fever, numbness or weakness. The symptoms are aggravated by bending. She has tried nothing for the symptoms. The treatment provided no relief.   Patient presents with constant, moderate, worsening right middle finger pain that began when she was pulling down on a bar and felt like she jammed her finger just PTA.  No meds tried PTA.  No modifying factors.  Movement makes it worse.  Denies fevers, numbness, or weakness.  Endorses decreased ROM secondary to pain.   Past Medical History  Diagnosis Date  . No pertinent past medical history   . Gonorrhea   . Back pain   . Medical history non-contributory    Past Surgical History  Procedure Laterality Date  . Induced abortion     Family History  Problem Relation Age of Onset  . Hypertension Mother    Social History  Substance Use Topics  . Smoking status: Former Smoker -- 0.25 packs/day for 9 years    Types: Cigarettes    Quit date: 04/12/2012  . Smokeless tobacco: Never Used  . Alcohol Use: No   OB History    Gravida Para Term Preterm AB TAB SAB Ectopic Multiple Living   Review of Systems  Constitutional: Negative for fever.  Musculoskeletal: Positive for joint swelling.  Neurological: Negative for weakness and numbness.      Allergies  Septra ds  Home Medications   Prior to Admission medications   Medication Sig Start Date  End Date Taking? Authorizing Provider  benzonatate (TESSALON) 100 MG capsule Take 1 capsule (100 mg total) by mouth 3 (three) times daily as needed for cough. 09/05/15   Antony Madura, PA-C  Chlorphen-Pseudoephed-APAP (THERAFLU FLU/COLD PO) Take 2 tablets by mouth every 6 (six) hours as needed (cold).    Historical Provider, MD  HYDROcodone-homatropine (HYCODAN) 5-1.5 MG/5ML syrup Take 5 mLs by mouth every 6 (six) hours as needed for cough (Do not drive or drink alcohol after taking due to potential drowsiness). 09/05/15   Antony Madura, PA-C  naproxen (NAPROSYN) 500 MG tablet Take 1 tablet (500 mg total) by mouth 2 (two) times daily. 09/05/15   Antony Madura, PA-C  predniSONE (DELTASONE) 20 MG tablet Take 2 tablets (40 mg total) by mouth daily. 09/05/15   Antony Madura, PA-C   BP 119/85 mmHg  Pulse 93  Temp(Src) 98.9 F (37.2 C) (Oral)  Resp 18  Wt 76.613 kg  SpO2 99% Physical Exam  Constitutional: She is oriented to person, place, and time. She appears well-developed and well-nourished.  HENT:  Head: Atraumatic.  Eyes: Conjunctivae are normal. No scleral icterus.  Neck: No tracheal deviation present.  Pulmonary/Chest: Effort normal. No respiratory distress.  Musculoskeletal: She exhibits tenderness.       Right hand: She exhibits decreased range of motion, tenderness, bony tenderness and swelling. She exhibits normal capillary refill and no deformity. Normal sensation noted. Normal strength noted.  Left hand: Normal.       Hands: Neurological: She is alert and oriented to person, place, and time.  Skin: Skin is warm and dry.  Psychiatric: She has a normal mood and affect. Her behavior is normal.    ED Course  Procedures (including critical care time) Labs Review Labs Reviewed - No data to display  Imaging Review Dg Finger Middle Right  10/01/2015  CLINICAL DATA:  34 year old female with right middle finger trauma and pain. EXAM: RIGHT MIDDLE FINGER 2+V COMPARISON:  None.  FINDINGS: There is minimally displaced fractures of the midportion of the middle phalanx of the middle finger. There is extension of the fracture line distally without definite involvement of the DIP articular surface. There is soft tissue swelling of the little finger. No radiopaque foreign object identified. IMPRESSION: Minimally displaced fractures of the middle phalanx of the middle finger. Electronically Signed   By: Elgie CollardArash  Radparvar M.D.   On: 10/01/2015 01:25   I have personally reviewed and evaluated these images and lab results as part of my medical decision-making.   EKG Interpretation None      MDM   Final diagnoses:  Fracture of phalanx of right middle finger, closed, initial encounter    Patient presents with right middle finger pain after pulling down on a bar.  No meds tried PTA.  VSS, NAD.  On exam, decreased ROM secondary to pain. NVI.  Will obtain plain films and give motrin for pain.  Plain films significant for minimally displaced fractures of middle phalanx of middle finger.  Will apply splint and buddy tap.  Follow up with hand in 3-5 days. Evaluation does not show pathology requring ongoing emergent intervention or admission. Pt is hemodynamically stable and mentating appropriately. Discussed findings/results and plan with patient/guardian, who agrees with plan. All questions answered. Return precautions discussed and outpatient follow up given.      Cheri FowlerKayla Adalberto Metzgar, PA-C 10/01/15 [id number]  Tomasita CrumbleAdeleke Oni, MD 10/01/15 [phone]

## 2015-10-01 NOTE — ED Notes (Signed)
Kayla, PA at bedside. 

## 2015-10-01 NOTE — ED Notes (Signed)
Pt presents with pain to the right middle finger after pulling "too hard"; swelling noted; pt reports extreme pain and limited ROM d/t swelling; pt denies numbness or tingling;

## 2015-10-01 NOTE — Discharge Instructions (Signed)
Finger Fracture °Fractures of fingers are breaks in the bones of the fingers. There are many types of fractures. There are different ways of treating these fractures. Your health care provider will discuss the best way to treat your fracture. °CAUSES °Traumatic injury is the main cause of broken fingers. These include: °· Injuries while playing sports. °· Workplace injuries. °· Falls. °RISK FACTORS °Activities that can increase your risk of finger fractures include: °· Sports. °· Workplace activities that involve machinery. °· A condition called osteoporosis, which can make your bones less dense and cause them to fracture more easily. °SIGNS AND SYMPTOMS °The main symptoms of a broken finger are pain and swelling within 15 minutes after the injury. Other symptoms include: °· Bruising of your finger. °· Stiffness of your finger. °· Numbness of your finger. °· Exposed bones (compound fracture) if the fracture is severe. °DIAGNOSIS  °The best way to diagnose a broken bone is with X-ray imaging. Additionally, your health care provider will use this X-ray image to evaluate the position of the broken finger bones.  °TREATMENT  °Finger fractures can be treated with:  °· Nonreduction--This means the bones are in place. The finger is splinted without changing the positions of the bone pieces. The splint is usually left on for about a week to 10 days. This will depend on your fracture and what your health care provider thinks. °· Closed reduction--The bones are put back into position without using surgery. The finger is then splinted. °· Open reduction and internal fixation--The fracture site is opened. Then the bone pieces are fixed into place with pins or some type of hardware. This is seldom required. It depends on the severity of the fracture. °HOME CARE INSTRUCTIONS  °· Follow your health care provider's instructions regarding activities, exercises, and physical therapy. °· Only take over-the-counter or prescription  medicines for pain, discomfort, or fever as directed by your health care provider. °SEEK MEDICAL CARE IF: °You have pain or swelling that limits the motion or use of your fingers. °SEEK IMMEDIATE MEDICAL CARE IF:  °Your finger becomes numb. °MAKE SURE YOU:  °· Understand these instructions. °· Will watch your condition. °· Will get help right away if you are not doing well or get worse. °  °This information is not intended to replace advice given to you by your health care provider. Make sure you discuss any questions you have with your health care provider. °  °Document Released: 01/07/2001 Document Revised: 07/16/2013 Document Reviewed: 05/07/2013 °Elsevier Interactive Patient Education ©2016 [employer] ° °Cast or Splint Care °Casts and splints support injured limbs and keep bones from moving while they heal. It is important to care for your cast or splint at home.   °HOME CARE INSTRUCTIONS °· Keep the cast or splint uncovered during the drying period. It can take 24 to 48 hours to dry if it is made of plaster. A fiberglass cast will dry in less than 1 hour. °· Do not rest the cast on anything harder than a pillow for the first 24 hours. °· Do not put weight on your injured limb or apply pressure to the cast until your health care provider gives you permission. °· Keep the cast or splint dry. Wet casts or splints can lose their shape and may not support the limb as well. A wet cast that has lost its shape can also create harmful pressure on your skin when it dries. Also, wet skin can become infected. °¨ Cover the cast or splint with   a plastic bag when bathing or when out in the rain or snow. If the cast is on the trunk of the body, take sponge baths until the cast is removed. °¨ If your cast does become wet, dry it with a towel or a blow dryer on the cool setting only. °· Keep your cast or splint clean. Soiled casts may be wiped with a moistened cloth. °· Do not place any hard or soft foreign objects under your  cast or splint, such as cotton, toilet paper, lotion, or powder. °· Do not try to scratch the skin under the cast with any object. The object could get stuck inside the cast. Also, scratching could lead to an infection. If itching is a problem, use a blow dryer on a cool setting to relieve discomfort. °· Do not trim or cut your cast or remove padding from inside of it. °· Exercise all joints next to the injury that are not immobilized by the cast or splint. For example, if you have a long leg cast, exercise the hip joint and toes. If you have an arm cast or splint, exercise the shoulder, elbow, thumb, and fingers. °· Elevate your injured arm or leg on 1 or 2 pillows for the first 1 to 3 days to decrease swelling and pain. It is best if you can comfortably elevate your cast so it is higher than your heart. °SEEK MEDICAL CARE IF:  °· Your cast or splint cracks. °· Your cast or splint is too tight or too loose. °· You have unbearable itching inside the cast. °· Your cast becomes wet or develops a soft spot or area. °· You have a bad smell coming from inside your cast. °· You get an object stuck under your cast. °· Your skin around the cast becomes red or raw. °· You have new pain or worsening pain after the cast has been applied. °SEEK IMMEDIATE MEDICAL CARE IF:  °· You have fluid leaking through the cast. °· You are unable to move your fingers or toes. °· You have discolored (blue or white), cool, painful, or very swollen fingers or toes beyond the cast. °· You have tingling or numbness around the injured area. °· You have severe pain or pressure under the cast. °· You have any difficulty with your breathing or have shortness of breath. °· You have chest pain. °  °This information is not intended to replace advice given to you by your health care provider. Make sure you discuss any questions you have with your health care provider. °  °Document Released: 09/22/2000 Document Revised: 07/16/2013 Document Reviewed:  04/03/2013 °Elsevier Interactive Patient Education ©2016 [employer] ° °

## 2016-03-31 ENCOUNTER — Inpatient Hospital Stay (HOSPITAL_COMMUNITY)
Admission: AD | Admit: 2016-03-31 | Discharge: 2016-03-31 | Discharge status: Against Medical Advice | Payer: Medicaid Other | Source: Ambulatory Visit | Attending: Family Medicine | Admitting: Family Medicine

## 2016-03-31 NOTE — MAU Note (Signed)
Pt called and not in lobby 

## 2016-03-31 NOTE — MAU Note (Signed)
Pt called for Triage and not in lobby 

## 2016-04-04 ENCOUNTER — Ambulatory Visit (HOSPITAL_COMMUNITY)
Admission: EM | Admit: 2016-04-04 | Discharge: 2016-04-04 | Disposition: A | Discharge status: Home / Self Care | Payer: Medicaid Other | Attending: Family Medicine | Admitting: Family Medicine

## 2016-04-04 ENCOUNTER — Encounter (HOSPITAL_COMMUNITY): Payer: Self-pay | Admitting: Emergency Medicine

## 2016-04-04 DIAGNOSIS — Z79899 Other long term (current) drug therapy: Secondary | ICD-10-CM | POA: Insufficient documentation

## 2016-04-04 DIAGNOSIS — F17210 Nicotine dependence, cigarettes, uncomplicated: Secondary | ICD-10-CM | POA: Insufficient documentation

## 2016-04-04 DIAGNOSIS — B373 Candidiasis of vulva and vagina: Secondary | ICD-10-CM | POA: Insufficient documentation

## 2016-04-04 DIAGNOSIS — B3731 Acute candidiasis of vulva and vagina: Secondary | ICD-10-CM

## 2016-04-04 LAB — POCT URINALYSIS DIP (DEVICE)
GLUCOSE, UA: NEGATIVE mg/dL
Ketones, ur: NEGATIVE mg/dL
NITRITE: NEGATIVE
PROTEIN: 30 mg/dL — AB
Specific Gravity, Urine: >=1.03 (ref 1.005–1.030)
UROBILINOGEN UA: 2 mg/dL — AB (ref 0.0–1.0)
pH: 6 (ref 5.0–8.0)

## 2016-04-04 LAB — POCT PREGNANCY, URINE: Preg Test, Ur: NEGATIVE

## 2016-04-04 MED ORDER — FLUCONAZOLE 150 MG PO TABS
150.0000 mg | ORAL_TABLET | Freq: Once | ORAL | Status: DC
Start: 2016-04-04 — End: 2016-06-27

## 2016-04-04 MED ORDER — TERCONAZOLE 80 MG VA SUPP: 80.0000 mg | Freq: Every day | VAGINAL | Status: DC

## 2016-04-04 NOTE — ED Provider Notes (Addendum)
CSN: [id number]     Arrival date & time 04/04/16  1546 History   First MD Initiated Contact with Patient 04/04/16 1658     Chief Complaint  Patient presents with  . Vaginitis   (Consider location/radiation/quality/duration/timing/severity/associated sxs/prior Treatment) Patient is a 35 y.o. female presenting with female genitourinary complaint. The history is provided by the patient.  Female GU Problem This is a new problem. The current episode started more than 2 days ago (white vag d/c with itching). The problem has been gradually worsening.    Past Medical History  Diagnosis Date  . No pertinent past medical history   . Gonorrhea   . Back pain   . Medical history non-contributory    Past Surgical History  Procedure Laterality Date  . Induced abortion     Family History  Problem Relation Age of Onset  . Hypertension Mother    Social History  Substance Use Topics  . Smoking status: Current Every Day Smoker -- 0.25 packs/day for 9 years    Types: Cigarettes    Last Attempt to Quit: 04/12/2012  . Smokeless tobacco: Never Used  . Alcohol Use: No   OB History    Gravida Para Term Preterm AB TAB SAB Ectopic Multiple Living   4 3 3  1 1    3      Review of Systems  Constitutional: Negative.   Gastrointestinal: Negative.   Genitourinary: Positive for vaginal discharge. Negative for vaginal bleeding, vaginal pain and pelvic pain.  All other systems reviewed and are negative.   Allergies  Septra ds  Home Medications   Prior to Admission medications   Medication Sig Start Date End Date Taking? Authorizing Provider  acetaminophen (TYLENOL) 500 MG tablet Take 1 tablet (500 mg total) by mouth every 6 (six) hours as needed. 10/01/15   Cheri FowlerKayla Rose, PA-C  benzonatate (TESSALON) 100 MG capsule Take 1 capsule (100 mg total) by mouth 3 (three) times daily as needed for cough. 09/05/15   Antony MaduraKelly Humes, PA-C  Chlorphen-Pseudoephed-APAP (THERAFLU FLU/COLD PO) Take 2 tablets by mouth  every 6 (six) hours as needed (cold).    Historical Provider, MD  fluconazole (DIFLUCAN) 150 MG tablet Take 1 tablet (150 mg total) by mouth once. May repeat in 1 week if needed 04/04/16   Linna HoffJames D Kathrynn Backstrom, MD  HYDROcodone-homatropine [facility](HYCODAN) 5-1.5 MG/5ML syrup Take 5 mLs by mouth every 6 (six) hours as needed for cough (Do not drive or drink alcohol after taking due to potential drowsiness). 09/05/15   Antony MaduraKelly Humes, PA-C  ibuprofen (ADVIL,MOTRIN) 800 MG tablet Take 1 tablet (800 mg total) by mouth 3 (three) times daily. 10/01/15   Cheri FowlerKayla Rose, PA-C  naproxen (NAPROSYN) 500 MG tablet Take 1 tablet (500 mg total) by mouth 2 (two) times daily. 09/05/15   Antony MaduraKelly Humes, PA-C  predniSONE (DELTASONE) 20 MG tablet Take 2 tablets (40 mg total) by mouth daily. 09/05/15   Antony MaduraKelly Humes, PA-C  terconazole (TERAZOL 3) 80 MG vaginal suppository Place 1 suppository (80 mg total) vaginally at bedtime. 04/04/16   Linna HoffJames D Treyon Wymore, MD   Meds Ordered and Administered this Visit  Medications - No data to display  BP 122/82 mmHg  Pulse 64  Temp(Src) 97.9 F (36.6 C) (Oral)  Resp 14  SpO2 99%  LMP 03/15/2016 (Exact Date) No data found.   Physical Exam  Constitutional: She is oriented to person, place, and time. She appears well-developed and well-nourished. No distress.  Abdominal: Soft. Bowel sounds are normal. There  is no tenderness.  Genitourinary: No vaginal discharge found.  currrently on menses, mod labial sts and pruritis, no lesions or d/c.  Neurological: She is alert and oriented to person, place, and time.  Skin: Skin is warm and dry.  Nursing note and vitals reviewed.   ED Course  Procedures (including critical care time)  Labs Review Labs Reviewed  POCT URINALYSIS DIP (DEVICE) - Abnormal; Notable for the following:    Bilirubin Urine SMALL (*)    Hgb urine dipstick LARGE (*)    Protein, ur 30 (*)    Urobilinogen, UA 2.0 (*)    Leukocytes, UA LARGE (*)    All other components within normal  limits  HIV ANTIBODY (ROUTINE TESTING)  RPR  CERVICOVAGINAL ANCILLARY ONLY    Imaging Review No results found.   Visual Acuity Review  Right Eye Distance:   Left Eye Distance:   Bilateral Distance:    Right Eye Near:   Left Eye Near:    Bilateral Near:         MDM   1. Vaginitis due to Candida        Linna HoffJames D Tachina Spoonemore, MD 04/04/16 1718  Linna HoffJames D Siniya Lichty, MD 04/04/16 1719

## 2016-04-04 NOTE — ED Notes (Signed)
Pt complains of vaginal itching, burning and d/c for 3 days.  She tried to treat it with an OTC medication but she developed some vaginal swelling from the medication.  Pt describes the d/c as thick and white.  She is married but stated, "I don't know what my husband does."

## 2016-04-05 LAB — URINE CULTURE: SPECIAL REQUESTS: NORMAL

## 2016-04-05 LAB — CERVICOVAGINAL ANCILLARY ONLY
Chlamydia: NEGATIVE
Neisseria Gonorrhea: NEGATIVE
WET PREP (BD AFFIRM): POSITIVE — AB

## 2016-04-05 LAB — HIV ANTIBODY (ROUTINE TESTING W REFLEX): HIV Screen 4th Generation wRfx: NONREACTIVE

## 2016-04-05 LAB — RPR: RPR Ser Ql: NONREACTIVE

## 2016-04-13 ENCOUNTER — Telehealth (HOSPITAL_COMMUNITY): Payer: Self-pay | Admitting: Emergency Medicine

## 2016-04-13 MED ORDER — METRONIDAZOLE 500 MG PO TABS: 2000.0000 mg | ORAL_TABLET | Freq: Once | ORAL | Status: DC

## 2016-04-13 NOTE — ED Notes (Addendum)
LM on pt's VM [phone] Need to give lab results from recent visit on 6/27 Also let pt know labs can be obtained from MyChart   Per Dr. Dayton ScrapeMurray,   Notes Recorded by Eustace MooreLaura W Murray, MD on 04/09/2016 at 8:30 AM Clinical staff, please let patient know that tests for HIV and syphilis (RPR), and gonorrhea/chlamydia were negative. Urine culture did not suggest UTI.  Test for trichomonas was positive; if still having vaginal itching/discharge, would send rx for metronidazole 2g po x 1 dose, no refills. Recheck as needed. LM

## 2016-04-13 NOTE — ED Notes (Signed)
Pt called back... Notified of lab results from visit on 6/27 Pt ID'd properly... Reports she is still having vag itching and d/c Per her request, e-Rx medication to [facility]([place]) Adv pt if sx are not getting better to return  Pt verb understanding Education on safe sex given  Per Dr. Dayton ScrapeMurray,  Notes Recorded by Eustace MooreLaura W Murray, MD on 04/09/2016 at 8:30 AM Clinical staff, please let patient know that tests for HIV and syphilis (RPR), and gonorrhea/chlamydia were negative. Urine culture did not suggest UTI.  Test for trichomonas was positive; if still having vaginal itching/discharge, would send rx for metronidazole 2g po x 1 dose, no refills. Recheck as needed. LM

## 2016-06-27 ENCOUNTER — Encounter (HOSPITAL_COMMUNITY): Payer: Self-pay | Admitting: *Deleted

## 2016-06-27 ENCOUNTER — Inpatient Hospital Stay (HOSPITAL_COMMUNITY)
Admission: AD | Admit: 2016-06-27 | Discharge: 2016-06-27 | Disposition: A | Discharge status: Home / Self Care | Payer: Medicaid Other | Source: Ambulatory Visit | Attending: Obstetrics and Gynecology | Admitting: Obstetrics and Gynecology

## 2016-06-27 ENCOUNTER — Inpatient Hospital Stay (HOSPITAL_COMMUNITY): Payer: Medicaid Other

## 2016-06-27 DIAGNOSIS — O9989 Other specified diseases and conditions complicating pregnancy, childbirth and the puerperium: Secondary | ICD-10-CM

## 2016-06-27 DIAGNOSIS — O99331 Smoking (tobacco) complicating pregnancy, first trimester: Secondary | ICD-10-CM | POA: Insufficient documentation

## 2016-06-27 DIAGNOSIS — N8311 Corpus luteum cyst of right ovary: Secondary | ICD-10-CM | POA: Diagnosis not present

## 2016-06-27 DIAGNOSIS — Z3A01 Less than 8 weeks gestation of pregnancy: Secondary | ICD-10-CM | POA: Insufficient documentation

## 2016-06-27 DIAGNOSIS — O26899 Other specified pregnancy related conditions, unspecified trimester: Secondary | ICD-10-CM

## 2016-06-27 DIAGNOSIS — F17210 Nicotine dependence, cigarettes, uncomplicated: Secondary | ICD-10-CM | POA: Insufficient documentation

## 2016-06-27 DIAGNOSIS — O26891 Other specified pregnancy related conditions, first trimester: Secondary | ICD-10-CM | POA: Diagnosis not present

## 2016-06-27 DIAGNOSIS — R109 Unspecified abdominal pain: Secondary | ICD-10-CM | POA: Insufficient documentation

## 2016-06-27 DIAGNOSIS — O3481 Maternal care for other abnormalities of pelvic organs, first trimester: Secondary | ICD-10-CM | POA: Diagnosis not present

## 2016-06-27 LAB — CBC
HEMATOCRIT: 37.8 % (ref 36.0–46.0)
Hemoglobin: 13.1 g/dL (ref 12.0–15.0)
MCH: 31.2 pg (ref 26.0–34.0)
MCHC: 34.7 g/dL (ref 30.0–36.0)
MCV: 90 fL (ref 78.0–100.0)
Platelets: 205 10*3/uL (ref 150–400)
RBC: 4.2 MIL/uL (ref 3.87–5.11)
RDW: 13 % (ref 11.5–15.5)
WBC: 11.6 10*3/uL — AB (ref 4.0–10.5)

## 2016-06-27 LAB — URINALYSIS, ROUTINE W REFLEX MICROSCOPIC
BILIRUBIN URINE: NEGATIVE
GLUCOSE, UA: NEGATIVE mg/dL
HGB URINE DIPSTICK: NEGATIVE
Ketones, ur: NEGATIVE mg/dL
Leukocytes, UA: NEGATIVE
Nitrite: NEGATIVE
PH: 8 (ref 5.0–8.0)
Protein, ur: NEGATIVE mg/dL
SPECIFIC GRAVITY, URINE: 1.02 (ref 1.005–1.030)

## 2016-06-27 LAB — WET PREP, GENITAL
Clue Cells Wet Prep HPF POC: NONE SEEN
SPERM: NONE SEEN
TRICH WET PREP: NONE SEEN
Yeast Wet Prep HPF POC: NONE SEEN

## 2016-06-27 LAB — POCT PREGNANCY, URINE
Preg Test, Ur: POSITIVE — AB
Preg Test, Ur: POSITIVE — AB

## 2016-06-27 LAB — HCG, QUANTITATIVE, PREGNANCY: hCG, Beta Chain, Quant, S: 194 m[IU]/mL — ABNORMAL HIGH (ref <5)

## 2016-06-27 NOTE — MAU Provider Note (Signed)
History     CSN: [id number]  Arrival date and time: 06/27/16 1135   None     Chief Complaint  Patient presents with  . Abdominal Pain   HPI Pt is [age].[id number] 4968w4d who had postive HPT and started having lower abd pain.  Pt denies vaginal bleeding or discharge.  Pain is diffuse lower abd pain like cramps RN note:   [] Hide copied text [] Hover for attribution information Pt had pos HPT this morning, had a neg UPT @ MD office last week.  Has been having lower abd pain since the first of the month, was dx'd with BV, finished medication.  Denies bleeding or dysuria.       Past Medical History:  Diagnosis Date  . Back pain   . Gonorrhea   . Medical history non-contributory   . No pertinent past medical history     Past Surgical History:  Procedure Laterality Date  . INDUCED ABORTION      Family History  Problem Relation Age of Onset  . Hypertension Mother     Social History  Substance Use Topics  . Smoking status: Current Every Day Smoker    Packs/day: 0.25    Years: 9.00    Types: Cigarettes    Last attempt to quit: 04/12/2012  . Smokeless tobacco: Never Used  . Alcohol use No    Allergies:  Allergies  Allergen Reactions  . Septra Ds [Sulfamethoxazole-Trimethoprim] Swelling    Prescriptions Prior to Admission  Medication Sig Dispense Refill Last Dose  . acetaminophen (TYLENOL) 500 MG tablet Take 1 tablet (500 mg total) by mouth every 6 (six) hours as needed. 30 tablet 0   . benzonatate (TESSALON) 100 MG capsule Take 1 capsule (100 mg total) by mouth 3 (three) times daily as needed for cough. 21 capsule 0   . Chlorphen-Pseudoephed-APAP (THERAFLU FLU/COLD PO) Take 2 tablets by mouth every 6 (six) hours as needed (cold).   09/04/2015 at Unknown time  . fluconazole (DIFLUCAN) 150 MG tablet Take 1 tablet (150 mg total) by mouth once. May repeat in 1 week if needed 1 tablet 1   . HYDROcodone-homatropine (HYCODAN) 5-1.5 MG/5ML syrup Take 5 mLs by mouth every 6  (six) hours as needed for cough (Do not drive or drink alcohol after taking due to potential drowsiness). 120 mL 0   . ibuprofen (ADVIL,MOTRIN) 800 MG tablet Take 1 tablet (800 mg total) by mouth 3 (three) times daily. 21 tablet 0   . metroNIDAZOLE (FLAGYL) 500 MG tablet Take 4 tablets (2,000 mg total) by mouth once. 4 tablet 0   . naproxen (NAPROSYN) 500 MG tablet Take 1 tablet (500 mg total) by mouth 2 (two) times daily. 30 tablet 0   . predniSONE (DELTASONE) 20 MG tablet Take 2 tablets (40 mg total) by mouth daily. 10 tablet 0   . terconazole (TERAZOL 3) 80 MG vaginal suppository Place 1 suppository (80 mg total) vaginally at bedtime. 3 suppository 0     Review of Systems  Constitutional: Negative for chills and fever.  HENT: Positive for sore throat.   Respiratory: Positive for cough and sputum production.   Cardiovascular: Negative for chest pain and palpitations.  Gastrointestinal: Positive for abdominal pain and nausea. Negative for constipation, diarrhea and vomiting.  Genitourinary: Negative for dysuria.  Neurological: Positive for headaches.   Physical Exam   Blood pressure 121/78, pulse 89, temperature 98.3 F (36.8 C), temperature source Oral, resp. rate 18, height 5\' 6"  (1.676 m), weight 156  lb (70.8 kg), last menstrual period 05/26/2016.  Physical Exam  Nursing note and vitals reviewed. Constitutional: She is oriented to person, place, and time. She appears well-developed and well-nourished. No distress.  HENT:  Head: Normocephalic.  Eyes: Pupils are equal, round, and reactive to light.  Neck: Normal range of motion. Neck supple.  Cardiovascular: Normal rate.   Respiratory: Effort normal.  GI: Soft.  Genitourinary:  Genitourinary Comments: Vagina clean, cervix clean NT; uterus retroverted NSSC, NT; adnexa without palpable enlargement or tenderness  Musculoskeletal: Normal range of motion.  Neurological: She is alert and oriented to person, place, and time.  Skin:  Skin is warm and dry.  Psychiatric: She has a normal mood and affect.    MAU Course  Procedures GC/chlamydia pending Labs: CBC, HCG [place] UPT pos Results for orders placed or performed during the hospital encounter of 06/27/16 (from the past 24 hour(s))  Urinalysis, Routine w reflex microscopic (not at [facility])     Status: None   Collection Time: 06/27/16 12:00 PM  Result Value Ref Range   Color, Urine YELLOW YELLOW   APPearance CLEAR CLEAR   Specific Gravity, Urine 1.020 1.005 - 1.030   pH 8.0 5.0 - 8.0   Glucose, UA NEGATIVE NEGATIVE mg/dL   Hgb urine dipstick NEGATIVE NEGATIVE   Bilirubin Urine NEGATIVE NEGATIVE   Ketones, ur NEGATIVE NEGATIVE mg/dL   Protein, ur NEGATIVE NEGATIVE mg/dL   Nitrite NEGATIVE NEGATIVE   Leukocytes, UA NEGATIVE NEGATIVE  Pregnancy, urine POC     Status: Abnormal   Collection Time: 06/27/16 12:03 PM  Result Value Ref Range   Preg Test, Ur POSITIVE (A) NEGATIVE  Pregnancy, urine POC     Status: Abnormal   Collection Time: 06/27/16 12:48 PM  Result Value Ref Range   Preg Test, Ur POSITIVE (A) NEGATIVE  Wet prep, genital     Status: Abnormal   Collection Time: 06/27/16  1:00 PM  Result Value Ref Range   Yeast Wet Prep HPF POC NONE SEEN NONE SEEN   Trich, Wet Prep NONE SEEN NONE SEEN   Clue Cells Wet Prep HPF POC NONE SEEN NONE SEEN   WBC, Wet Prep HPF POC FEW (A) NONE SEEN   Sperm NONE SEEN   CBC     Status: Abnormal   Collection Time: 06/27/16  2:20 PM  Result Value Ref Range   WBC 11.6 (H) 4.0 - 10.5 K/uL   RBC 4.20 3.87 - 5.11 MIL/uL   Hemoglobin 13.1 12.0 - 15.0 g/dL   HCT 16.1 09.6 - 04.5 %   MCV 90.0 78.0 - 100.0 fL   MCH 31.2 26.0 - 34.0 pg   MCHC 34.7 30.0 - 36.0 g/dL   RDW 40.9 81.1 - 91.4 %   Platelets 205 150 - 400 K/uL  hCG, quantitative, pregnancy     Status: Abnormal   Collection Time: 06/27/16  2:20 PM  Result Value Ref Range   hCG, Beta Chain, Quant, S 194 (H) <5 mIU/mL  US Ob Comp Less 14 Wks  Result Date:  06/27/2016 CLINICAL DATA:  Positive early urine pregnancy test, pelvic pain EXAM: OBSTETRIC <14 WK [place] AND TRANSVAGINAL OB US TECHNIQUE: Both transabdominal and transvaginal ultrasound examinations were performed for complete evaluation of the gestation as well as the maternal uterus, adnexal regions, and pelvic cul-de-sac. Transvaginal technique was performed to assess early pregnancy. COMPARISON:  11/07/2009 FINDINGS: Intrauterine gestational sac: Not visualized Yolk sac:  Not visualized Embryo:  Not visualized Cardiac Activity: Not detected  Heart Rate: Not detected Subchorionic hemorrhage:  None visualized. Maternal uterus/adnexae: No visualized intrauterine gestational sac or IUP. Normal appearing endometrium roughly measuring 10 mm. No subchorionic hemorrhage. Normal left ovary measuring 3.3 x 2.0 x 1.8 cm. Right ovary is heterogeneous and mildly enlarged measuring 4.3 x 4.3 x 2.8 cm. Right ovary appears to contain a probable corpus luteum cyst. No separate adnexal mass or adnexal abnormality appreciated. Small amount of pelvic free fluid appearing simple. IMPRESSION: No visualized intrauterine pregnancy with a probable small right ovarian corpus luteum cyst. Small amount of pelvic free fluid. Differential considerations remain too early of an IUP to be visualize, nonvisualized ectopic, or ongoing abortion. Recommend close follow-up quantitative beta HCG and follow-up obstetric ultrasound as indicated clinically. Electronically Signed   By: Judie Petit.  Shick M.D.   On: 06/27/2016 16:16   US Ob Transvaginal  Result Date: 06/27/2016 CLINICAL DATA:  Positive early urine pregnancy test, pelvic pain EXAM: OBSTETRIC <14 WK [place] AND TRANSVAGINAL OB US TECHNIQUE: Both transabdominal and transvaginal ultrasound examinations were performed for complete evaluation of the gestation as well as the maternal uterus, adnexal regions, and pelvic cul-de-sac. Transvaginal technique was performed to assess early pregnancy. COMPARISON:   11/07/2009 FINDINGS: Intrauterine gestational sac: Not visualized Yolk sac:  Not visualized Embryo:  Not visualized Cardiac Activity: Not detected Heart Rate: Not detected Subchorionic hemorrhage:  None visualized. Maternal uterus/adnexae: No visualized intrauterine gestational sac or IUP. Normal appearing endometrium roughly measuring 10 mm. No subchorionic hemorrhage. Normal left ovary measuring 3.3 x 2.0 x 1.8 cm. Right ovary is heterogeneous and mildly enlarged measuring 4.3 x 4.3 x 2.8 cm. Right ovary appears to contain a probable corpus luteum cyst. No separate adnexal mass or adnexal abnormality appreciated. Small amount of pelvic free fluid appearing simple. IMPRESSION: No visualized intrauterine pregnancy with a probable small right ovarian corpus luteum cyst. Small amount of pelvic free fluid. Differential considerations remain too early of an IUP to be visualize, nonvisualized ectopic, or ongoing abortion. Recommend close follow-up quantitative beta HCG and follow-up obstetric ultrasound as indicated clinically. Electronically Signed   By: Judie Petit.  Shick M.D.   On: 06/27/2016 16:16  GC/Chlamydia pending Assessment and Plan  Abdominal pain in pregnancy Pregnancy of unknown location- repeat HCG in clinic on Thursday at 11 am Right CLC Return for increase in pain or vaginal bleeding.  Jennifer Leach 06/27/2016, 2:16 PM

## 2016-06-27 NOTE — MAU Note (Addendum)
Pt had pos HPT this morning, had a neg UPT @ MD office last week.  Has been having lower abd pain since the first of the month, was dx'd with BV, finished medication.  Denies bleeding or dysuria.

## 2016-06-28 LAB — GC/CHLAMYDIA PROBE AMP (~~LOC~~) NOT AT ARMC
CHLAMYDIA, DNA PROBE: NEGATIVE
Neisseria Gonorrhea: NEGATIVE

## 2016-06-28 LAB — HIV ANTIBODY (ROUTINE TESTING W REFLEX): HIV Screen 4th Generation wRfx: NONREACTIVE

## 2016-06-29 ENCOUNTER — Encounter: Payer: Self-pay | Admitting: Family Medicine

## 2016-06-29 ENCOUNTER — Emergency Department (HOSPITAL_COMMUNITY)
Admission: EM | Admit: 2016-06-29 | Discharge: 2016-06-29 | Discharge status: Against Medical Advice | Payer: Medicaid Other

## 2016-06-29 ENCOUNTER — Other Ambulatory Visit: Payer: Medicaid Other

## 2016-06-29 DIAGNOSIS — O3680X0 Pregnancy with inconclusive fetal viability, not applicable or unspecified: Secondary | ICD-10-CM

## 2016-06-29 LAB — HCG, QUANTITATIVE, PREGNANCY: hCG, Beta Chain, Quant, S: 460 m[IU]/mL — ABNORMAL HIGH (ref <5)

## 2016-06-29 NOTE — ED Triage Notes (Signed)
Pt wants to leave at this time, has to get kids ready for school. LWBS before traige.

## 2016-06-29 NOTE — Progress Notes (Signed)
Patient present today for her repeat BHCG. Patient stated she having a little pain at this time.Patient has been informed to wait the entire time for her lab results to make sure she is informed. Patient verbalizes understanding at this time.   Per provider quant levels are rising at a good number. Medications have been reviewed. Patient has been schedule for u/s on 07/06/2016.

## 2016-07-06 ENCOUNTER — Ambulatory Visit: Payer: Self-pay

## 2016-07-06 ENCOUNTER — Ambulatory Visit (HOSPITAL_COMMUNITY)
Admission: RE | Admit: 2016-07-06 | Discharge: 2016-07-06 | Disposition: A | Discharge status: Home / Self Care | Payer: Medicaid Other | Source: Ambulatory Visit | Attending: Obstetrics & Gynecology | Admitting: Obstetrics & Gynecology

## 2016-07-06 DIAGNOSIS — Z3A01 Less than 8 weeks gestation of pregnancy: Secondary | ICD-10-CM | POA: Diagnosis not present

## 2016-07-06 DIAGNOSIS — O26891 Other specified pregnancy related conditions, first trimester: Secondary | ICD-10-CM | POA: Diagnosis present

## 2016-07-06 DIAGNOSIS — O3680X0 Pregnancy with inconclusive fetal viability, not applicable or unspecified: Secondary | ICD-10-CM

## 2016-07-06 DIAGNOSIS — R1030 Lower abdominal pain, unspecified: Secondary | ICD-10-CM | POA: Diagnosis not present

## 2016-07-06 NOTE — Progress Notes (Signed)
Patient was sent down from U/S for results. Reviewed results with Dr.Pratt who suggested to complete another U/S in one week. I have scheduled patient for 07/14/2016. Patient is aware and will follow up next week.

## 2016-07-07 ENCOUNTER — Inpatient Hospital Stay (HOSPITAL_COMMUNITY)
Admission: AD | Admit: 2016-07-07 | Discharge: 2016-07-07 | Discharge status: Against Medical Advice | Payer: Medicaid Other | Source: Ambulatory Visit | Attending: Obstetrics and Gynecology | Admitting: Obstetrics and Gynecology

## 2016-07-07 NOTE — MAU Note (Signed)
Pt called and not in lobby 

## 2016-07-07 NOTE — MAU Note (Signed)
Pt called for Triage and not in lobby 

## 2016-07-10 ENCOUNTER — Encounter (HOSPITAL_COMMUNITY): Payer: Self-pay | Admitting: *Deleted

## 2016-07-10 ENCOUNTER — Inpatient Hospital Stay (HOSPITAL_COMMUNITY): Payer: Medicaid Other

## 2016-07-10 ENCOUNTER — Inpatient Hospital Stay (HOSPITAL_COMMUNITY)
Admission: AD | Admit: 2016-07-10 | Discharge: 2016-07-10 | Disposition: A | Discharge status: Home / Self Care | Payer: Medicaid Other | Source: Ambulatory Visit | Attending: Obstetrics and Gynecology | Admitting: Obstetrics and Gynecology

## 2016-07-10 DIAGNOSIS — Z3A01 Less than 8 weeks gestation of pregnancy: Secondary | ICD-10-CM | POA: Insufficient documentation

## 2016-07-10 DIAGNOSIS — R109 Unspecified abdominal pain: Secondary | ICD-10-CM

## 2016-07-10 DIAGNOSIS — Z87891 Personal history of nicotine dependence: Secondary | ICD-10-CM | POA: Diagnosis not present

## 2016-07-10 DIAGNOSIS — O26891 Other specified pregnancy related conditions, first trimester: Secondary | ICD-10-CM

## 2016-07-10 LAB — URINALYSIS, ROUTINE W REFLEX MICROSCOPIC
Glucose, UA: NEGATIVE mg/dL
HGB URINE DIPSTICK: NEGATIVE
Ketones, ur: NEGATIVE mg/dL
Leukocytes, UA: NEGATIVE
Nitrite: NEGATIVE
PROTEIN: NEGATIVE mg/dL
Specific Gravity, Urine: >1.03 — ABNORMAL HIGH (ref 1.005–1.030)
pH: 6 (ref 5.0–8.0)

## 2016-07-10 MED ORDER — PROMETHAZINE HCL 25 MG PO TABS
25.0000 mg | ORAL_TABLET | Freq: Every evening | ORAL | 1 refills | Status: DC | PRN
Start: 2016-07-10 — End: 2016-07-25

## 2016-07-10 MED ORDER — METOCLOPRAMIDE HCL 10 MG PO TABS: 10.0000 mg | ORAL_TABLET | Freq: Three times a day (TID) | ORAL | 0 refills | Status: DC

## 2016-07-10 MED ORDER — METOCLOPRAMIDE HCL 10 MG PO TABS
10.0000 mg | ORAL_TABLET | Freq: Once | ORAL | Status: AC
  Administered 2016-07-10: 10 mg via ORAL
  Filled 2016-07-10: qty 1

## 2016-07-10 MED ORDER — ACETAMINOPHEN 500 MG PO TABS
1000.0000 mg | ORAL_TABLET | Freq: Once | ORAL | Status: AC
  Administered 2016-07-10: 1000 mg via ORAL
  Filled 2016-07-10: qty 2

## 2016-07-10 NOTE — Discharge Instructions (Signed)

## 2016-07-10 NOTE — MAU Note (Signed)
Pt c/o abdominal cramping in lower abdomen for last few days. Denies vaginal bleeding or abnormal discharge or itching.  Also states having soft normal bowel movements.

## 2016-07-10 NOTE — MAU Provider Note (Signed)
History     CSN: [id number]  Arrival date and time: 07/10/16 [id number]   First Provider Initiated Contact with Patient 07/10/16 [phone]      Chief Complaint  Patient presents with  . Abdominal Cramping   HPI   Jennifer Leach is a 35 y.o. female [phone] @ 7127w3d here with abdominal pain. She currently rates her pain 6/10. She is worried because she was told that the pregnancy may not progress. She has been having nausea and vomiting and would like something to take at home for the symptoms.  She had beta hcg levels drawn and they went up approprietly.   OB History    Gravida Para Term Preterm AB Living   4 3 3    0 3   SAB TAB Ectopic Multiple Live Births     0     3      Past Medical History:  Diagnosis Date  . Back pain   . Gonorrhea   . No pertinent past medical history     Past Surgical History:  Procedure Laterality Date  . INDUCED ABORTION      Family History  Problem Relation Age of Onset  . Hypertension Mother     Social History  Substance Use Topics  . Smoking status: Former Smoker    Packs/day: 0.25    Years: 9.00    Types: Cigarettes    Quit date: 06/09/2012  . Smokeless tobacco: Never Used  . Alcohol use No    Allergies:  Allergies  Allergen Reactions  . Septra Ds [Sulfamethoxazole-Trimethoprim] Anaphylaxis, Swelling, Rash and Other (See Comments)    Reaction:  Facial swelling     Prescriptions Prior to Admission  Medication Sig Dispense Refill Last Dose  . Prenatal Vit-Fe Fumarate-FA (PRENATAL MULTIVITAMIN) TABS tablet Take 1 tablet by mouth daily.   Past Week at Unknown time   Results for orders placed or performed during the hospital encounter of 07/10/16 (from the past 48 hour(s))  Urinalysis, Routine w reflex microscopic (not at [facility])     Status: Abnormal   Collection Time: 07/10/16  8:00 AM  Result Value Ref Range   Color, Urine YELLOW YELLOW   APPearance CLEAR CLEAR   Specific Gravity, Urine >1.030 (H) 1.005 - 1.030   pH 6.0 5.0 - 8.0    Glucose, UA NEGATIVE NEGATIVE mg/dL   Hgb urine dipstick NEGATIVE NEGATIVE   Bilirubin Urine SMALL (A) NEGATIVE   Ketones, ur NEGATIVE NEGATIVE mg/dL   Protein, ur NEGATIVE NEGATIVE mg/dL   Nitrite NEGATIVE NEGATIVE   Leukocytes, UA NEGATIVE NEGATIVE    Comment: MICROSCOPIC NOT DONE ON URINES WITH NEGATIVE PROTEIN, BLOOD, LEUKOCYTES, NITRITE, OR GLUCOSE <1000 mg/dL.   Results for orders placed or performed during the hospital encounter of 07/10/16 (from the past 72 hour(s))  Urinalysis, Routine w reflex microscopic (not at [facility])     Status: Abnormal   Collection Time: 07/10/16  8:00 AM  Result Value Ref Range   Color, Urine YELLOW YELLOW   APPearance CLEAR CLEAR   Specific Gravity, Urine >1.030 (H) 1.005 - 1.030   pH 6.0 5.0 - 8.0   Glucose, UA NEGATIVE NEGATIVE mg/dL   Hgb urine dipstick NEGATIVE NEGATIVE   Bilirubin Urine SMALL (A) NEGATIVE   Ketones, ur NEGATIVE NEGATIVE mg/dL   Protein, ur NEGATIVE NEGATIVE mg/dL   Nitrite NEGATIVE NEGATIVE   Leukocytes, UA NEGATIVE NEGATIVE    Comment: MICROSCOPIC NOT DONE ON URINES WITH NEGATIVE PROTEIN, BLOOD, LEUKOCYTES, NITRITE, OR GLUCOSE <1000  mg/dL.    US Ob Transvaginal  Result Date: 07/10/2016 CLINICAL DATA:  Pain in pregnancy. EXAM: TRANSVAGINAL OB ULTRASOUND TECHNIQUE: Transvaginal ultrasound was performed for complete evaluation of the gestation as well as the maternal uterus, adnexal regions, and pelvic cul-de-sac. COMPARISON:  07/06/2016 FINDINGS: Intrauterine gestational sac: Single Yolk sac:  Yes Embryo:  Yes Cardiac Activity: Yes Heart Rate: 101 bpm CRL:   2   mm   5 w 5 d                  [place] EDC: 03/07/2017 Maternal uterus/adnexae: Subchorionic hemorrhage: None Right ovary: Appears normal Left ovary: Appears normal Other :None Free fluid:  Small IMPRESSION: 1. Single living intrauterine gestation with an estimated gestational age [redacted] weeks and 5 days. 2. Small amount of free fluid noted, nonspecific. Electronically Signed   By:  Signa Kell M.D.   On: 07/10/2016 09:53    Review of Systems  Constitutional: Negative for chills and fever.  Gastrointestinal: Positive for abdominal pain (Lower abdominal cramping ). Negative for nausea and vomiting.   Physical Exam   Blood pressure 121/66, pulse 69, temperature 97.7 F (36.5 C), temperature source Oral, resp. rate 16, last menstrual period 05/26/2016, SpO2 97 %.  Physical Exam  Constitutional: She is oriented to person, place, and time. She appears well-developed and well-nourished. No distress.  GI: Soft. She exhibits no distension. There is no tenderness. There is no rebound and no guarding.  Musculoskeletal: Normal range of motion.  Neurological: She is alert and oriented to person, place, and time.  Skin: Skin is warm. She is not diaphoretic.  Psychiatric: Her behavior is normal.    MAU Course  Procedures  None  MDM  Reglan & tylenol given in MAU.  Discussed US findings with the patient. Patient feels much better.   Assessment and Plan    A:  1. Abdominal pain in pregnancy, first trimester      P:  Discharge home in stable condition Return precautions discussed Rx: Reglan, Phenergan Start OB care Small, frequent meals.     Duane Lope, NP 07/12/2016 7:30 PM

## 2016-07-14 ENCOUNTER — Ambulatory Visit (HOSPITAL_COMMUNITY): Admission: RE | Admit: 2016-07-14 | Payer: Medicaid Other | Source: Ambulatory Visit

## 2016-07-15 ENCOUNTER — Encounter (HOSPITAL_COMMUNITY): Payer: Self-pay | Admitting: Certified Nurse Midwife

## 2016-07-15 ENCOUNTER — Inpatient Hospital Stay (HOSPITAL_COMMUNITY)
Admission: AD | Admit: 2016-07-15 | Discharge: 2016-07-15 | Disposition: A | Discharge status: Home / Self Care | Payer: Medicaid Other | Source: Ambulatory Visit | Attending: Obstetrics and Gynecology | Admitting: Obstetrics and Gynecology

## 2016-07-15 DIAGNOSIS — O219 Vomiting of pregnancy, unspecified: Secondary | ICD-10-CM

## 2016-07-15 DIAGNOSIS — Z87891 Personal history of nicotine dependence: Secondary | ICD-10-CM | POA: Insufficient documentation

## 2016-07-15 DIAGNOSIS — O26891 Other specified pregnancy related conditions, first trimester: Secondary | ICD-10-CM | POA: Diagnosis not present

## 2016-07-15 DIAGNOSIS — O210 Mild hyperemesis gravidarum: Secondary | ICD-10-CM | POA: Diagnosis not present

## 2016-07-15 DIAGNOSIS — R109 Unspecified abdominal pain: Secondary | ICD-10-CM

## 2016-07-15 DIAGNOSIS — Z3A01 Less than 8 weeks gestation of pregnancy: Secondary | ICD-10-CM | POA: Insufficient documentation

## 2016-07-15 LAB — URINALYSIS, ROUTINE W REFLEX MICROSCOPIC
BILIRUBIN URINE: NEGATIVE
Glucose, UA: NEGATIVE mg/dL
HGB URINE DIPSTICK: NEGATIVE
KETONES UR: NEGATIVE mg/dL
Nitrite: NEGATIVE
PROTEIN: NEGATIVE mg/dL
Specific Gravity, Urine: 1.02 (ref 1.005–1.030)
pH: 6.5 (ref 5.0–8.0)

## 2016-07-15 LAB — URINE MICROSCOPIC-ADD ON

## 2016-07-15 MED ORDER — ONDANSETRON 8 MG PO TBDP
8.0000 mg | ORAL_TABLET | Freq: Once | ORAL | Status: AC
  Administered 2016-07-15: 8 mg via ORAL
  Filled 2016-07-15: qty 1

## 2016-07-15 MED ORDER — ONDANSETRON 4 MG PO TBDP: 4.0000 mg | ORAL_TABLET | Freq: Three times a day (TID) | ORAL | 1 refills | Status: DC | PRN

## 2016-07-15 NOTE — Discharge Instructions (Signed)
Hyperemesis Gravidarum  Hyperemesis gravidarum is a severe form of nausea and vomiting that happens during pregnancy. Hyperemesis is worse than morning sickness. It may cause you to have nausea or vomiting all day for many days. It may keep you from eating and drinking enough food and liquids. Hyperemesis usually occurs during the first half (the first 20 weeks) of pregnancy. It often goes away once a woman is in her second half of pregnancy. However, sometimes hyperemesis continues through an entire pregnancy.   CAUSES   The cause of this condition is not completely known but is thought to be related to changes in the body's hormones when pregnant. It could be from the high level of the pregnancy hormone or an increase in estrogen in the body.   SIGNS AND SYMPTOMS    Severe nausea and vomiting.   Nausea that does not go away.   Vomiting that does not allow you to keep any food down.   Weight loss and body fluid loss (dehydration).   Having no desire to eat or not liking food you have previously enjoyed.  DIAGNOSIS   Your health care provider will do a physical exam and ask you about your symptoms. He or she may also order blood tests and urine tests to make sure something else is not causing the problem.   TREATMENT   You may only need medicine to control the problem. If medicines do not control the nausea and vomiting, you will be treated in the hospital to prevent dehydration, increased acid in the blood (acidosis), weight loss, and changes in the electrolytes in your body that may harm the unborn baby (fetus). You may need IV fluids.   HOME CARE INSTRUCTIONS    Only take over-the-counter or prescription medicines as directed by your health care provider.   Try eating a couple of dry crackers or toast in the morning before getting out of bed.   Avoid foods and smells that upset your stomach.   Avoid fatty and spicy foods.   Eat 5-6 small meals a day.   Do not drink when eating meals. Drink between  meals.   For snacks, eat high-protein foods, such as cheese.   Eat or suck on things that have ginger in them. Ginger helps nausea.   Avoid food preparation. The smell of food can spoil your appetite.   Avoid iron pills and iron in your multivitamins until after 3-4 months of being pregnant. However, consult with your health care provider before stopping any prescribed iron pills.  SEEK MEDICAL CARE IF:    Your abdominal pain increases.   You have a severe headache.   You have vision problems.   You are losing weight.  SEEK IMMEDIATE MEDICAL CARE IF:    You are unable to keep fluids down.   You vomit blood.   You have constant nausea and vomiting.   You have excessive weakness.   You have extreme thirst.   You have dizziness or fainting.   You have a fever or persistent symptoms for more than 2-3 days.   You have a fever and your symptoms suddenly get worse.  MAKE SURE YOU:    Understand these instructions.   Will watch your condition.   Will get help right away if you are not doing well or get worse.     This information is not intended to replace advice given to you by your health care provider. Make sure you discuss any questions you have with   your health care provider.     Document Released: 09/25/2005 Document Revised: 07/16/2013 Document Reviewed: 05/07/2013  Elsevier Interactive Patient Education 2016 [employer]

## 2016-07-15 NOTE — MAU Provider Note (Signed)
History   [id number] @ 7.1 wk in with nausea and vomiting for a week. Has no kept anything down all day.  CSN: [id number]  Arrival date & time 07/15/16  1859   None     Chief Complaint  Patient presents with  . Emesis During Pregnancy  . Dizziness  . Abdominal Pain    HPI  Past Medical History:  Diagnosis Date  . Back pain   . Gonorrhea   . No pertinent past medical history     Past Surgical History:  Procedure Laterality Date  . INDUCED ABORTION      Family History  Problem Relation Age of Onset  . Hypertension Mother     Social History  Substance Use Topics  . Smoking status: Former Smoker    Packs/day: 0.25    Years: 9.00    Types: Cigarettes    Quit date: 06/09/2012  . Smokeless tobacco: Never Used  . Alcohol use No    OB History    Gravida Para Term Preterm AB Living   4 3 3    0 3   SAB TAB Ectopic Multiple Live Births     0     3      Review of Systems  Constitutional: Negative.   HENT: Negative.   Eyes: Negative.   Respiratory: Negative.   Cardiovascular: Negative.   Gastrointestinal: Positive for nausea and vomiting.  Endocrine: Negative.   Genitourinary: Negative.   Musculoskeletal: Negative.   Skin: Negative.   Allergic/Immunologic: Negative.   Neurological: Negative.   Hematological: Negative.   Psychiatric/Behavioral: Negative.     Allergies  Septra ds [sulfamethoxazole-trimethoprim]  Home Medications    BP 109/79 (BP Location: Right Arm)   Pulse 91   Temp 98.1 F (36.7 C)   Resp 18   Ht 5\' 6"  (1.676 m)   Wt 154 lb 6.4 oz (70 kg)   LMP 05/26/2016 (Approximate)   BMI 24.92 kg/m   Physical Exam  Constitutional: She is oriented to person, place, and time. She appears well-developed and well-nourished.  HENT:  Head: Normocephalic.  Eyes: Pupils are equal, round, and reactive to light.  Neck: Normal range of motion.  Cardiovascular: Normal rate, regular rhythm, normal heart sounds and intact distal pulses.    Pulmonary/Chest: Effort normal and breath sounds normal.  Abdominal: Soft. Bowel sounds are normal.  Genitourinary: Vagina normal and uterus normal.  Musculoskeletal: Normal range of motion.  Neurological: She is alert and oriented to person, place, and time. She has normal reflexes.  Skin: Skin is warm and dry.  Psychiatric: She has a normal mood and affect. Her behavior is normal. Judgment and thought content normal.    MAU Course  Procedures (including critical care time)  Labs Reviewed  URINALYSIS, ROUTINE W REFLEX MICROSCOPIC (NOT AT [facility])   No results found.   No diagnosis found.    MDM  preg at 7.1 wks Nausea and vomiting of pregnancy. lenghty discussion on home remedies for nausea. Discussed tx options risk and benefits of zofran. Now retainning fluids orall y will d/c home

## 2016-07-15 NOTE — MAU Note (Signed)
N/v for a wk. Medicines given to me for n/v make me feel worse. Dizzy. Some abd pain. Denies vag bleeding. Some white dc/

## 2016-07-21 ENCOUNTER — Inpatient Hospital Stay (HOSPITAL_COMMUNITY)
Admission: AD | Admit: 2016-07-21 | Discharge: 2016-07-22 | Disposition: A | Discharge status: Home / Self Care | Payer: Medicaid Other | Source: Ambulatory Visit | Attending: Obstetrics & Gynecology | Admitting: Obstetrics & Gynecology

## 2016-07-21 ENCOUNTER — Inpatient Hospital Stay (HOSPITAL_COMMUNITY): Payer: Medicaid Other

## 2016-07-21 ENCOUNTER — Encounter (HOSPITAL_COMMUNITY): Payer: Self-pay | Admitting: *Deleted

## 2016-07-21 DIAGNOSIS — O209 Hemorrhage in early pregnancy, unspecified: Secondary | ICD-10-CM | POA: Diagnosis not present

## 2016-07-21 DIAGNOSIS — S3991XA Unspecified injury of abdomen, initial encounter: Secondary | ICD-10-CM | POA: Diagnosis not present

## 2016-07-21 DIAGNOSIS — Z87891 Personal history of nicotine dependence: Secondary | ICD-10-CM | POA: Insufficient documentation

## 2016-07-21 DIAGNOSIS — O9A211 Injury, poisoning and certain other consequences of external causes complicating pregnancy, first trimester: Secondary | ICD-10-CM

## 2016-07-21 DIAGNOSIS — O26891 Other specified pregnancy related conditions, first trimester: Secondary | ICD-10-CM | POA: Diagnosis present

## 2016-07-21 DIAGNOSIS — Z3A08 8 weeks gestation of pregnancy: Secondary | ICD-10-CM | POA: Diagnosis not present

## 2016-07-21 DIAGNOSIS — W1839XA Other fall on same level, initial encounter: Secondary | ICD-10-CM | POA: Insufficient documentation

## 2016-07-21 LAB — URINALYSIS, ROUTINE W REFLEX MICROSCOPIC
Bilirubin Urine: NEGATIVE
GLUCOSE, UA: NEGATIVE mg/dL
HGB URINE DIPSTICK: NEGATIVE
Ketones, ur: NEGATIVE mg/dL
LEUKOCYTES UA: NEGATIVE
Nitrite: NEGATIVE
Protein, ur: NEGATIVE mg/dL
SPECIFIC GRAVITY, URINE: 1.015 (ref 1.005–1.030)
pH: 8 (ref 5.0–8.0)

## 2016-07-21 MED ORDER — PROMETHAZINE HCL 25 MG PO TABS: 25.0000 mg | ORAL_TABLET | Freq: Once | ORAL | Status: DC

## 2016-07-21 NOTE — MAU Note (Signed)
Pt c/o sharp pelvic pain on both sides after a fall  into the tub  three days ago. Light vaginal spotting began the next day. Vomiting persists despite zofran ODT; pt reports she is able to eat but does not tolerate any fluids.

## 2016-07-21 NOTE — MAU Provider Note (Signed)
Chief Complaint: No chief complaint on file.   None     SUBJECTIVE HPI: Jennifer Leach is a 35 y.o. G4P3003 at [redacted]w[redacted]d by LMP who presents to maternity admissions reporting she fell forward onto her abdomen on the edge of the tub at home 4 days ago, then started having abdominal cramping and vaginal spotting the next day, 3 days ago.  She reports seeing 2-3 small spots of pink bleeding in her underwear daily since the fall.  This is a new symptom. She has not tried any treatments. Nothing makes it better or worse.  She reports cramping intermittent abdominal pain x 3 days in her lower abdomen, right and left sides.  She reports this as as new symptom but has been evaluated recently for abdominal pain in pregnancy and has IUP confirmed by previous US. She has not taken anything for her pain. Nothing makes it better or worse.  She also reports ongoing nausea and vomiting, reporting the Reglan, Phenergan, and now Zofran she has at home are not helping. She does keep down some food daily but not much fluid.  She denies vaginal itching/burning, urinary symptoms, h/a, dizziness, or fever/chills.     HPI  Past Medical History:  Diagnosis Date  . Back pain   . Gonorrhea   . No pertinent past medical history    Past Surgical History:  Procedure Laterality Date  . INDUCED ABORTION     Social History   Social History  . Marital status: Single    Spouse name: N/A  . Number of children: N/A  . Years of education: N/A   Occupational History  . Not on file.   Social History Main Topics  . Smoking status: Former Smoker    Packs/day: 0.25    Years: 9.00    Types: Cigarettes    Quit date: 06/09/2012  . Smokeless tobacco: Never Used  . Alcohol use No  . Drug use: No  . Sexual activity: Yes   Other Topics Concern  . Not on file   Social History Narrative  . No narrative on file   No current facility-administered medications on file prior to encounter.    Current Outpatient Prescriptions  on File Prior to Encounter  Medication Sig Dispense Refill  . Prenatal Vit-Fe Fumarate-FA (PRENATAL MULTIVITAMIN) TABS tablet Take 1 tablet by mouth daily.    . metoCLOPramide (REGLAN) 10 MG tablet Take 1 tablet (10 mg total) by mouth 3 (three) times daily before meals. (Patient not taking: Reported on 07/21/2016) 30 tablet 0  . ondansetron (ZOFRAN ODT) 4 MG disintegrating tablet Take 1 tablet (4 mg total) by mouth every 8 (eight) hours as needed for nausea or vomiting. (Patient not taking: Reported on 07/21/2016) 40 tablet 1  . promethazine (PHENERGAN) 25 MG tablet Take 1 tablet (25 mg total) by mouth at bedtime as needed for nausea or vomiting. (Patient not taking: Reported on 07/21/2016) 20 tablet 1   Allergies  Allergen Reactions  . Septra Ds [Sulfamethoxazole-Trimethoprim] Anaphylaxis, Swelling, Rash and Other (See Comments)    Reaction:  Facial swelling     ROS:  Review of Systems  Constitutional: Negative for chills, fatigue and fever.  Respiratory: Negative for shortness of breath.   Cardiovascular: Negative for chest pain.  Gastrointestinal: Positive for nausea and vomiting.  Genitourinary: Positive for pelvic pain and vaginal bleeding. Negative for difficulty urinating, dysuria, flank pain, vaginal discharge and vaginal pain.  Neurological: Negative for dizziness and headaches.  Psychiatric/Behavioral: Negative.  I have reviewed patient's Past Medical Hx, Surgical Hx, Family Hx, Social Hx, medications and allergies.   Physical Exam  No data found.  VS reviewed and wnl  Constitutional: Well-developed, well-nourished female in no acute distress.  Cardiovascular: normal rate Respiratory: normal effort GI: Abd soft, non-tender. Pos BS x 4 MS: Extremities nontender, no edema, normal ROM Neurologic: Alert and oriented x 4.  GU: Neg CVAT.  PELVIC EXAM: Deferred, cultures collected within last 30 days. No bleeding on pantyliner pt is wearing in MAU.    LAB RESULTS No  results found for this or any previous visit (from the past 24 hour(s)). Results for orders placed or performed during the hospital encounter of 07/21/16 (from the past 168 hour(s))  Urinalysis, Routine w reflex microscopic (not at [facility])   Collection Time: 07/21/16  9:10 PM  Result Value Ref Range   Color, Urine YELLOW YELLOW   APPearance CLEAR CLEAR   Specific Gravity, Urine 1.015 1.005 - 1.030   pH 8.0 5.0 - 8.0   Glucose, UA NEGATIVE NEGATIVE mg/dL   Hgb urine dipstick NEGATIVE NEGATIVE   Bilirubin Urine NEGATIVE NEGATIVE   Ketones, ur NEGATIVE NEGATIVE mg/dL   Protein, ur NEGATIVE NEGATIVE mg/dL   Nitrite NEGATIVE NEGATIVE   Leukocytes, UA NEGATIVE NEGATIVE       IMAGING US Ob Comp Less 14 Wks  Result Date: 06/27/2016 CLINICAL DATA:  Positive early urine pregnancy test, pelvic pain EXAM: OBSTETRIC <14 WK [place] AND TRANSVAGINAL OB US TECHNIQUE: Both transabdominal and transvaginal ultrasound examinations were performed for complete evaluation of the gestation as well as the maternal uterus, adnexal regions, and pelvic cul-de-sac. Transvaginal technique was performed to assess early pregnancy. COMPARISON:  11/07/2009 FINDINGS: Intrauterine gestational sac: Not visualized Yolk sac:  Not visualized Embryo:  Not visualized Cardiac Activity: Not detected Heart Rate: Not detected Subchorionic hemorrhage:  None visualized. Maternal uterus/adnexae: No visualized intrauterine gestational sac or IUP. Normal appearing endometrium roughly measuring 10 mm. No subchorionic hemorrhage. Normal left ovary measuring 3.3 x 2.0 x 1.8 cm. Right ovary is heterogeneous and mildly enlarged measuring 4.3 x 4.3 x 2.8 cm. Right ovary appears to contain a probable corpus luteum cyst. No separate adnexal mass or adnexal abnormality appreciated. Small amount of pelvic free fluid appearing simple. IMPRESSION: No visualized intrauterine pregnancy with a probable small right ovarian corpus luteum cyst. Small amount of pelvic  free fluid. Differential considerations remain too early of an IUP to be visualize, nonvisualized ectopic, or ongoing abortion. Recommend close follow-up quantitative beta HCG and follow-up obstetric ultrasound as indicated clinically. Electronically Signed   By: Judie Petit.  Shick M.D.   On: 06/27/2016 16:16   US Ob Transvaginal  Result Date: 07/21/2016 CLINICAL DATA:  35 y/o F; pregnant patient with 3 days of pelvic pain. EXAM: TRANSVAGINAL OB ULTRASOUND TECHNIQUE: Transvaginal ultrasound was performed for complete evaluation of the gestation as well as the maternal uterus, adnexal regions, and pelvic cul-de-sac. COMPARISON:  07/10/2016 pelvic ultrasound. FINDINGS: Intrauterine gestational sac: Single Yolk sac:  Present Embryo:  Present Cardiac Activity: Present Heart Rate: 142 bpm CRL:   1.28 cm  mm   7 w 3 d                  [place] EDC: 03/06/2017 Subchorionic hemorrhage:  None visualized. Maternal uterus/adnexae: Uterus, bilateral ovaries, and bilateral adnexa are unremarkable. Trace physiologic pelvic fluid. IMPRESSION: Single live intrauterine pregnancy with expected interval growth. No acute process identified. Electronically Signed   By: Mitzi Hansen  M.D.   On: 07/21/2016 23:20   US Ob Transvaginal  Result Date: 07/10/2016 CLINICAL DATA:  Pain in pregnancy. EXAM: TRANSVAGINAL OB ULTRASOUND TECHNIQUE: Transvaginal ultrasound was performed for complete evaluation of the gestation as well as the maternal uterus, adnexal regions, and pelvic cul-de-sac. COMPARISON:  07/06/2016 FINDINGS: Intrauterine gestational sac: Single Yolk sac:  Yes Embryo:  Yes Cardiac Activity: Yes Heart Rate: 101 bpm CRL:   2   mm   5 w 5 d                  [place] EDC: 03/07/2017 Maternal uterus/adnexae: Subchorionic hemorrhage: None Right ovary: Appears normal Left ovary: Appears normal Other :None Free fluid:  Small IMPRESSION: 1. Single living intrauterine gestation with an estimated gestational age [redacted] weeks and 5 days. 2. Small  amount of free fluid noted, nonspecific. Electronically Signed   By: Signa Kell M.D.   On: 07/10/2016 09:53   US Ob Transvaginal  Result Date: 07/06/2016 CLINICAL DATA:  35 year old pregnant female presenting for follow-up of pregnancy viability. Patient reports lower abdominal discomfort. EDC by LMP: 03/02/2017, projecting to an expected gestational age of [redacted] weeks 6 days. EXAM: TRANSVAGINAL OB ULTRASOUND TECHNIQUE: Transvaginal ultrasound was performed for complete evaluation of the gestation as well as the maternal uterus, adnexal regions, and pelvic cul-de-sac. COMPARISON:  06/27/2016 obstetric scan. FINDINGS: Intrauterine gestational sac: Single intrauterine gestational sac appears normal and position and contour. Yolk sac:  Present. Embryo:  Not visualized. Embryonic Cardiac Activity: Not visualized. MSD: 9.2  mm   5 w   5  d Subchorionic hemorrhage:  None visualized. Maternal uterus/adnexae: Right ovary measures 4.3 x 3.1 x 2.9 cm and contains a corpus luteum. Left ovary measures 3.7 x 1.8 x 1.5 cm. No suspicious ovarian or adnexal masses. Small volume simple free fluid in the pelvic cul-de-sac. IMPRESSION: 1. Single intrauterine gestational sac with yolk sac at 5 weeks 5 days by mean sac diameter. No embryo or embryonic cardiac activity visualized, which could be due to early gestational age. A follow-up obstetric scan is advised in 11-14 days to determine viability. This recommendation follows SRU consensus guidelines: Diagnostic Criteria for Nonviable Pregnancy Early in the First Trimester. Malva Limes Med 2013; 161:0960-45. 2. No suspicious ovarian or adnexal findings. 3. Nonspecific small volume simple free fluid in the pelvic cul-de-sac. Electronically Signed   By: Delbert Phenix M.D.   On: 07/06/2016 09:12   US Ob Transvaginal  Result Date: 06/27/2016 CLINICAL DATA:  Positive early urine pregnancy test, pelvic pain EXAM: OBSTETRIC <14 WK [place] AND TRANSVAGINAL OB US TECHNIQUE: Both transabdominal  and transvaginal ultrasound examinations were performed for complete evaluation of the gestation as well as the maternal uterus, adnexal regions, and pelvic cul-de-sac. Transvaginal technique was performed to assess early pregnancy. COMPARISON:  11/07/2009 FINDINGS: Intrauterine gestational sac: Not visualized Yolk sac:  Not visualized Embryo:  Not visualized Cardiac Activity: Not detected Heart Rate: Not detected Subchorionic hemorrhage:  None visualized. Maternal uterus/adnexae: No visualized intrauterine gestational sac or IUP. Normal appearing endometrium roughly measuring 10 mm. No subchorionic hemorrhage. Normal left ovary measuring 3.3 x 2.0 x 1.8 cm. Right ovary is heterogeneous and mildly enlarged measuring 4.3 x 4.3 x 2.8 cm. Right ovary appears to contain a probable corpus luteum cyst. No separate adnexal mass or adnexal abnormality appreciated. Small amount of pelvic free fluid appearing simple. IMPRESSION: No visualized intrauterine pregnancy with a probable small right ovarian corpus luteum cyst. Small amount of pelvic free fluid. Differential  considerations remain too early of an IUP to be visualize, nonvisualized ectopic, or ongoing abortion. Recommend close follow-up quantitative beta HCG and follow-up obstetric ultrasound as indicated clinically. Electronically Signed   By: Judie PetitM.  Shick M.D.   On: 06/27/2016 16:16    MAU Management/MDM: Ordered labs and US and reviewed results.  Viable IUP with appropriate interval growth on today's US.  Follow up with prenatal care as planned.  Pt stable at time of discharge.  ASSESSMENT 1. Vaginal bleeding in pregnancy, first trimester   2. Traumatic injury during pregnancy in first trimester     PLAN Discharge home   Medication List    TAKE these medications   metoCLOPramide 10 MG tablet Commonly known as:  REGLAN Take 1 tablet (10 mg total) by mouth 3 (three) times daily before meals.   ondansetron 4 MG disintegrating tablet Commonly known  as:  ZOFRAN ODT Take 1 tablet (4 mg total) by mouth every 8 (eight) hours as needed for nausea or vomiting.   prenatal multivitamin Tabs tablet Take 1 tablet by mouth daily.   promethazine 25 MG tablet Commonly known as:  PHENERGAN Take 1 tablet (25 mg total) by mouth at bedtime as needed for nausea or vomiting.      Follow-up Information    [facility]   Why:  As scheduled Contact information: [address] [place] [id number] [phone]          Sharen CounterLisa Leftwich-Kirby Certified Nurse-Midwife 07/23/2016  9:03 PM

## 2016-07-22 DIAGNOSIS — O209 Hemorrhage in early pregnancy, unspecified: Secondary | ICD-10-CM

## 2016-07-22 NOTE — Progress Notes (Signed)
Written and verbal d/c instructions given and understanding voiced. 

## 2016-07-25 ENCOUNTER — Other Ambulatory Visit (HOSPITAL_COMMUNITY)
Admission: RE | Admit: 2016-07-25 | Discharge: 2016-07-25 | Disposition: A | Discharge status: Home / Self Care | Payer: Medicaid Other | Source: Ambulatory Visit | Attending: Certified Nurse Midwife | Admitting: Certified Nurse Midwife

## 2016-07-25 ENCOUNTER — Encounter: Payer: Self-pay | Admitting: Certified Nurse Midwife

## 2016-07-25 ENCOUNTER — Ambulatory Visit (INDEPENDENT_AMBULATORY_CARE_PROVIDER_SITE_OTHER): Payer: Medicaid Other | Admitting: Certified Nurse Midwife

## 2016-07-25 VITALS — BP 115/75 | HR 66

## 2016-07-25 DIAGNOSIS — O210 Mild hyperemesis gravidarum: Secondary | ICD-10-CM | POA: Diagnosis not present

## 2016-07-25 DIAGNOSIS — N760 Acute vaginitis: Secondary | ICD-10-CM | POA: Insufficient documentation

## 2016-07-25 DIAGNOSIS — Z3491 Encounter for supervision of normal pregnancy, unspecified, first trimester: Secondary | ICD-10-CM

## 2016-07-25 DIAGNOSIS — Z113 Encounter for screening for infections with a predominantly sexual mode of transmission: Secondary | ICD-10-CM | POA: Diagnosis present

## 2016-07-25 DIAGNOSIS — Z01419 Encounter for gynecological examination (general) (routine) without abnormal findings: Secondary | ICD-10-CM | POA: Insufficient documentation

## 2016-07-25 DIAGNOSIS — Z3490 Encounter for supervision of normal pregnancy, unspecified, unspecified trimester: Secondary | ICD-10-CM

## 2016-07-25 DIAGNOSIS — Z1151 Encounter for screening for human papillomavirus (HPV): Secondary | ICD-10-CM | POA: Diagnosis present

## 2016-07-25 DIAGNOSIS — Z3480 Encounter for supervision of other normal pregnancy, unspecified trimester: Secondary | ICD-10-CM | POA: Insufficient documentation

## 2016-07-25 LAB — POCT URINALYSIS DIPSTICK
BILIRUBIN UA: NEGATIVE
GLUCOSE UA: NEGATIVE
KETONES UA: NEGATIVE
LEUKOCYTES UA: NEGATIVE
NITRITE UA: NEGATIVE
PH UA: 7
Protein, UA: NEGATIVE
RBC UA: NEGATIVE
Spec Grav, UA: 1.01
Urobilinogen, UA: NEGATIVE

## 2016-07-25 MED ORDER — DOXYLAMINE-PYRIDOXINE 10-10 MG PO TBEC: DELAYED_RELEASE_TABLET | ORAL | 4 refills | Status: DC

## 2016-07-25 MED ORDER — PROCHLORPERAZINE MALEATE 10 MG PO TABS: 10.0000 mg | ORAL_TABLET | Freq: Four times a day (QID) | ORAL | 0 refills | Status: DC | PRN

## 2016-07-25 NOTE — Progress Notes (Signed)
Subjective:    Jennifer Leach is being seen today for her first obstetrical visit.  This is not a planned pregnancy. She is at 3843w6d gestation. Her obstetrical history is significant for none. Relationship with FOB: significant other, living together. Patient does not intend to breast feed. Pregnancy history fully reviewed.  Works in the [employer] for [employer].    The information documented in the HPI was reviewed and verified.  Menstrual History: OB History    Gravida Para Term Preterm AB Living   4 3 3    0 3   SAB TAB Ectopic Multiple Live Births     0     3     All vaginal deliveries: 9#8oz largest infant.    Menarche age: 35 years of age. Patient's last menstrual period was 05/26/2016 (approximate).    Past Medical History:  Diagnosis Date  . Back pain   . Gonorrhea   . No pertinent past medical history     Past Surgical History:  Procedure Laterality Date  . INDUCED ABORTION       (Not in a hospital admission) Allergies  Allergen Reactions  . Septra Ds [Sulfamethoxazole-Trimethoprim] Anaphylaxis, Swelling, Rash and Other (See Comments)    Reaction:  Facial swelling     Social History  Substance Use Topics  . Smoking status: Former Smoker    Packs/day: 0.25    Years: 9.00    Types: Cigarettes    Quit date: 06/09/2012  . Smokeless tobacco: Never Used  . Alcohol use No    Family History  Problem Relation Age of Onset  . Hypertension Mother      Review of Systems Constitutional: negative for weight loss Gastrointestinal: + for nausea & vomiting Genitourinary:negative for genital lesions and vaginal discharge and dysuria Musculoskeletal:negative for back pain Behavioral/Psych: negative for abusive relationship, depression, illegal drug usage and tobacco use    Objective:    BP 115/75   Pulse 66   LMP 05/26/2016 (Approximate)  General Appearance:    Alert, cooperative, no distress, appears stated age  Head:    Normocephalic, without obvious abnormality,  atraumatic  Eyes:    PERRL, conjunctiva/corneas clear, EOM's intact, fundi    benign, both eyes  Ears:    Normal TM's and external ear canals, both ears  Nose:   Nares normal, septum midline, mucosa normal, no drainage    or sinus tenderness  Throat:   Lips, mucosa, and tongue normal; teeth and gums normal  Neck:   Supple, symmetrical, trachea midline, no adenopathy;    thyroid:  no enlargement/tenderness/nodules; no carotid   bruit or JVD  Back:     Symmetric, no curvature, ROM normal, no CVA tenderness  Lungs:     Clear to auscultation bilaterally, respirations unlabored  Chest Wall:    No tenderness or deformity   Heart:    Regular rate and rhythm, S1 and S2 normal, no murmur, rub   or gallop  Breast Exam:    No tenderness, masses, or nipple abnormality  Abdomen:     Soft, non-tender, bowel sounds active all four quadrants,    no masses, no organomegaly  Genitalia:    Normal female without lesion, discharge or tenderness  Extremities:   Extremities normal, atraumatic, no cyanosis or edema  Pulses:   2+ and symmetric all extremities  Skin:   Skin color, texture, turgor normal, no rashes or lesions  Lymph nodes:   Cervical, supraclavicular, and axillary nodes normal  Neurologic:   CNII-XII intact,  normal strength, sensation and reflexes    throughout       Cervix:  Long, thick, closed and posterior.      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  Counseling provided regarding continued use of seat belts, cessation of alcohol consumption, smoking or use of illicit drugs; infection precautions i.e., influenza/TDAP immunizations, toxoplasmosis,CMV, parvovirus, listeria and varicella; workplace safety, exercise during pregnancy; routine dental care, safe medications, sexual activity, hot tubs, saunas, pools, travel,  caffeine use, fish and methlymercury, potential toxins, hair treatments, varicose veins Weight gain recommendations per IOM guidelines reviewed: underweight/BMI< 18.5--> gain 28 - 40 lbs; normal weight/BMI 18.5 - 24.9--> gain 25 - 35 lbs; overweight/BMI 25 - 29.9--> gain 15 - 25 lbs; obese/BMI >30->gain  11 - 20 lbs Problem list reviewed and updated. FIRST/CF mutation testing/NIPT/QUAD SCREEN/fragile X/Ashkenazi Jewish population testing/Spinal muscular atrophy discussed: requested. Role of ultrasound in pregnancy discussed; fetal survey: requested. Amniocentesis discussed: not indicated. VBAC calculator score: VBAC consent form provided Meds ordered this encounter  Medications  . prochlorperazine (COMPAZINE) 10 MG tablet    Sig: Take 1 tablet (10 mg total) by mouth every 6 (six) hours as needed for nausea or vomiting.    Dispense:  30 tablet    Refill:  0  . Doxylamine-Pyridoxine (DICLEGIS) 10-10 MG TBEC    Sig: Take 1 tablet with breakfast and lunch.  Take 2 tablets at bedtime.    Dispense:  100 tablet    Refill:  4   Orders Placed This Encounter  Procedures  . Culture, OB Urine  . TSH  . Hemoglobinopathy evaluation  . Varicella zoster antibody, IgG  . Prenatal Profile I  . ToxASSURE Select 13 (MW), Urine  . Hemoglobin A1c  . POCT urinalysis dipstick    Follow up in 4 weeks. 50% of 30 min visit spent on counseling and coordination of care.

## 2016-07-25 NOTE — Progress Notes (Signed)
Pt states that she is having N&V daily.  Pt currently has medications and states that they aren't helping.

## 2016-07-26 LAB — CYTOLOGY - PAP
Diagnosis: NEGATIVE
HPV: NOT DETECTED

## 2016-07-26 LAB — CERVICOVAGINAL ANCILLARY ONLY
CHLAMYDIA, DNA PROBE: NEGATIVE
NEISSERIA GONORRHEA: NEGATIVE
TRICH (WINDOWPATH): NEGATIVE

## 2016-07-27 ENCOUNTER — Encounter: Payer: Self-pay | Admitting: Certified Nurse Midwife

## 2016-07-28 ENCOUNTER — Telehealth: Payer: Self-pay | Admitting: *Deleted

## 2016-07-28 LAB — CERVICOVAGINAL ANCILLARY ONLY
Bacterial vaginitis: NEGATIVE
Candida vaginitis: NEGATIVE

## 2016-07-28 NOTE — Telephone Encounter (Signed)
PA submitted and approved for Diclegis Rx. Pharmacy made aware. 

## 2016-07-30 LAB — URINE CULTURE, OB REFLEX

## 2016-07-30 LAB — CULTURE, OB URINE

## 2016-08-01 LAB — PRENATAL PROFILE I(LABCORP)
Antibody Screen: NEGATIVE
BASOS ABS: 0 10*3/uL (ref 0.0–0.2)
Basos: 0 %
EOS (ABSOLUTE): 0.2 10*3/uL (ref 0.0–0.4)
EOS: 2 %
Hematocrit: 40.4 % (ref 34.0–46.6)
Hemoglobin: 13.9 g/dL (ref 11.1–15.9)
Hepatitis B Surface Ag: NEGATIVE
IMMATURE GRANULOCYTES: 0 %
Immature Grans (Abs): 0 10*3/uL (ref 0.0–0.1)
LYMPHS ABS: 2.5 10*3/uL (ref 0.7–3.1)
Lymphs: 30 %
MCH: 31 pg (ref 26.6–33.0)
MCHC: 34.4 g/dL (ref 31.5–35.7)
MCV: 90 fL (ref 79–97)
MONOCYTES: 5 %
Monocytes Absolute: 0.4 10*3/uL (ref 0.1–0.9)
NEUTROS ABS: 5.2 10*3/uL (ref 1.4–7.0)
NEUTROS PCT: 63 %
PLATELETS: 258 10*3/uL (ref 150–379)
RBC: 4.49 x10E6/uL (ref 3.77–5.28)
RDW: 13 % (ref 12.3–15.4)
RH TYPE: POSITIVE
RPR Ser Ql: NONREACTIVE
Rubella Antibodies, IGG: 1.78 index (ref >0.99)
WBC: 8.3 10*3/uL (ref 3.4–10.8)

## 2016-08-01 LAB — HEMOGLOBINOPATHY EVALUATION
HGB A: 97.8 % (ref 94.0–98.0)
HGB C: 0 %
HGB S: 0 %
Hemoglobin A2 Quantitation: 2.2 % (ref 0.7–3.1)
Hemoglobin F Quantitation: 0 % (ref 0.0–2.0)

## 2016-08-01 LAB — HEMOGLOBIN A1C
ESTIMATED AVERAGE GLUCOSE: 91 mg/dL
HEMOGLOBIN A1C: 4.8 % (ref 4.8–5.6)

## 2016-08-01 LAB — TSH: TSH: 0.24 u[IU]/mL — ABNORMAL LOW (ref 0.450–4.500)

## 2016-08-01 LAB — TOXASSURE SELECT 13 (MW), URINE

## 2016-08-01 LAB — VARICELLA ZOSTER ANTIBODY, IGG: VARICELLA: 2011 {index} (ref >165)

## 2016-08-02 ENCOUNTER — Telehealth: Payer: Self-pay | Admitting: *Deleted

## 2016-08-02 NOTE — Telephone Encounter (Signed)
Patient states she has been constipated x1 week. Patient feels she needs to go- but nothing comes out. Patient reports she was constipated the last  Time she went and it caused rectal bleeding. Patient could be impacted. Patient has not used anything to help herself move. Patient normally sees Dr Clearance CootsHarper.

## 2016-08-22 ENCOUNTER — Ambulatory Visit (INDEPENDENT_AMBULATORY_CARE_PROVIDER_SITE_OTHER): Payer: Medicaid Other | Admitting: Certified Nurse Midwife

## 2016-08-22 VITALS — BP 129/81 | HR 81 | Wt 158.0 lb

## 2016-08-22 DIAGNOSIS — Z3491 Encounter for supervision of normal pregnancy, unspecified, first trimester: Secondary | ICD-10-CM

## 2016-08-22 DIAGNOSIS — Z3490 Encounter for supervision of normal pregnancy, unspecified, unspecified trimester: Secondary | ICD-10-CM

## 2016-08-22 MED ORDER — OB COMPLETE PETITE 35-5-1-200 MG PO CAPS: 1.0000 | ORAL_CAPSULE | Freq: Every day | ORAL | 12 refills | Status: DC

## 2016-08-22 NOTE — Patient Instructions (Addendum)
Second Trimester of Pregnancy The second trimester is from week 13 through week 28 (months 4 through 6). The second trimester is often a time when you feel your best. Your body has also adjusted to being pregnant, and you begin to feel better physically. Usually, morning sickness has lessened or quit completely, you may have more energy, and you may have an increase in appetite. The second trimester is also a time when the fetus is growing rapidly. At the end of the sixth month, the fetus is about 9 inches long and weighs about 1 pounds. You will likely begin to feel the baby move (quickening) between 18 and 20 weeks of the pregnancy. Body changes during your second trimester Your body continues to go through many changes during your second trimester. The changes vary from woman to woman.  Your weight will continue to increase. You will notice your lower abdomen bulging out.  You may begin to get stretch marks on your hips, abdomen, and breasts.  You may develop headaches that can be relieved by medicines. The medicines should be approved by your health care provider.  You may urinate more often because the fetus is pressing on your bladder.  You may develop or continue to have heartburn as a result of your pregnancy.  You may develop constipation because certain hormones are causing the muscles that push waste through your intestines to slow down.  You may develop hemorrhoids or swollen, bulging veins (varicose veins).  You may have back pain. This is caused by:  Weight gain.  Pregnancy hormones that are relaxing the joints in your pelvis.  A shift in weight and the muscles that support your balance.  Your breasts will continue to grow and they will continue to become tender.  Your gums may bleed and may be sensitive to brushing and flossing.  Dark spots or blotches (chloasma, mask of pregnancy) may develop on your face. This will likely fade after the baby is born.  A dark line  from your belly button to the pubic area (linea nigra) may appear. This will likely fade after the baby is born.  You may have changes in your hair. These can include thickening of your hair, rapid growth, and changes in texture. Some women also have hair loss during or after pregnancy, or hair that feels dry or thin. Your hair will most likely return to normal after your baby is born. What to expect at prenatal visits During a routine prenatal visit:  You will be weighed to make sure you and the fetus are growing normally.  Your blood pressure will be taken.  Your abdomen will be measured to track your baby's growth.  The fetal heartbeat will be listened to.  Any test results from the previous visit will be discussed. Your health care provider may ask you:  How you are feeling.  If you are feeling the baby move.  If you have had any abnormal symptoms, such as leaking fluid, bleeding, severe headaches, or abdominal cramping.  If you are using any tobacco products, including cigarettes, chewing tobacco, and electronic cigarettes.  If you have any questions. Other tests that may be performed during your second trimester include:  Blood tests that check for:  Low iron levels (anemia).  Gestational diabetes (between 24 and 28 weeks).  Rh antibodies. This is to check for a protein on red blood cells (Rh factor).  Urine tests to check for infections, diabetes, or protein in the urine.  An ultrasound to   confirm the proper growth and development of the baby.  An amniocentesis to check for possible genetic problems.  Fetal screens for spina bifida and Down syndrome.  HIV (human immunodeficiency virus) testing. Routine prenatal testing includes screening for HIV, unless you choose not to have this test. Follow these instructions at home: Eating and drinking  Continue to eat regular, healthy meals.  Avoid raw meat, uncooked cheese, cat litter boxes, and soil used by cats. These  carry germs that can cause birth defects in the baby.  Take your prenatal vitamins.  Take 1500-2000 mg of calcium daily starting at the 20th week of pregnancy until you deliver your baby.  If you develop constipation:  Take over-the-counter or prescription medicines.  Drink enough fluid to keep your urine clear or pale yellow.  Eat foods that are high in fiber, such as fresh fruits and vegetables, whole grains, and beans.  Limit foods that are high in fat and processed sugars, such as fried and sweet foods. Activity  Exercise only as directed by your health care provider. Experiencing uterine cramps is a good sign to stop exercising.  Avoid heavy lifting, wear low heel shoes, and practice good posture.  Wear your seat belt at all times when driving.  Rest with your legs elevated if you have leg cramps or low back pain.  Wear a good support bra for breast tenderness.  Do not use hot tubs, steam rooms, or saunas. Lifestyle  Avoid all smoking, herbs, alcohol, and unprescribed drugs. These chemicals affect the formation and growth of the baby.  Do not use any products that contain nicotine or tobacco, such as cigarettes and e-cigarettes. If you need help quitting, ask your health care provider.  A sexual relationship may be continued unless your health care provider directs you otherwise. General instructions  Follow your health care provider's instructions regarding medicine use. There are medicines that are either safe or unsafe to take during pregnancy.  Take warm sitz baths to soothe any pain or discomfort caused by hemorrhoids. Use hemorrhoid cream if your health care provider approves.  If you develop varicose veins, wear support hose. Elevate your feet for 15 minutes, 3-4 times a day. Limit salt in your diet.  Visit your dentist if you have not gone yet during your pregnancy. Use a soft toothbrush to brush your teeth and be gentle when you floss.  Keep all follow-up  prenatal visits as told by your health care provider. This is important. Contact a health care provider if:  You have dizziness.  You have mild pelvic cramps, pelvic pressure, or nagging pain in the abdominal area.  You have persistent nausea, vomiting, or diarrhea.  You have a bad smelling vaginal discharge.  You have pain with urination. Get help right away if:  You have a fever.  You are leaking fluid from your vagina.  You have spotting or bleeding from your vagina.  You have severe abdominal cramping or pain.  You have rapid weight gain or weight loss.  You have shortness of breath with chest pain.  You notice sudden or extreme swelling of your face, hands, ankles, feet, or legs.  You have not felt your baby move in over an hour.  You have severe headaches that do not go away with medicine.  You have vision changes. Summary  The second trimester is from week 13 through week 28 (months 4 through 6). It is also a time when the fetus is growing rapidly.  Your body goes   through many changes during pregnancy. The changes vary from woman to woman.  Avoid all smoking, herbs, alcohol, and unprescribed drugs. These chemicals affect the formation and growth your baby.  Do not use any tobacco products, such as cigarettes, chewing tobacco, and e-cigarettes. If you need help quitting, ask your health care provider.  Contact your health care provider if you have any questions. Keep all prenatal visits as told by your health care provider. This is important. This information is not intended to replace advice given to you by your health care provider. Make sure you discuss any questions you have with your health care provider. Document Released: 09/19/2001 Document Revised: 03/02/2016 Document Reviewed: 11/26/2012 Elsevier Interactive Patient Education  2017 [employer]  

## 2016-08-22 NOTE — Progress Notes (Signed)
  Subjective:    Jennifer Leach is a 35 y.o. female being seen today for her obstetrical visit. She is at 6731w6d gestation. Patient reports: no complaints.  Problem List Items Addressed This Visit      Other   Supervision of normal pregnancy, antepartum - Primary   Relevant Medications   Prenat-FeCbn-FeAspGl-FA-Omega (OB COMPLETE PETITE) 35-5-1-200 MG CAPS   Other Relevant Orders   Thyroid Panel With TSH   MaterniT21 PLUS Core+SCA     Patient Active Problem List   Diagnosis Date Noted  . Supervision of normal pregnancy, antepartum 07/25/2016  . MRSA (methicillin resistant staph aureus) culture positive 08/06/2014    Objective:     BP 129/81   Pulse 81   Wt 158 lb (71.7 kg)   LMP 05/26/2016 (Approximate)   BMI 25.50 kg/m  Uterine Size: Below umbilicus   FHR;  155 by doppler  Assessment:    Pregnancy @ 7531w6d  weeks Doing well    Plan:    Problem list reviewed and updated. Labs reviewed.  Follow up in 4 weeks. FIRST/CF mutation testing/NIPT/QUAD SCREEN/fragile X/Ashkenazi Jewish population testing/Spinal muscular atrophy discussed: ordered. Role of ultrasound in pregnancy discussed; fetal survey: requested. Amniocentesis discussed: not indicated. 50% of 15 minute visit spent on counseling and coordination of care.

## 2016-08-23 LAB — THYROID PANEL WITH TSH
Free Thyroxine Index: 2.1 (ref 1.2–4.9)
T3 UPTAKE RATIO: 23 % — AB (ref 24–39)
T4 TOTAL: 9.3 ug/dL (ref 4.5–12.0)
TSH: 0.84 u[IU]/mL (ref 0.450–4.500)

## 2016-08-28 ENCOUNTER — Encounter (HOSPITAL_COMMUNITY): Payer: Self-pay | Admitting: *Deleted

## 2016-08-28 ENCOUNTER — Inpatient Hospital Stay (HOSPITAL_COMMUNITY)
Admission: AD | Admit: 2016-08-28 | Discharge: 2016-08-28 | Disposition: A | Discharge status: Home / Self Care | Payer: Medicaid Other | Source: Ambulatory Visit | Attending: Family Medicine | Admitting: Family Medicine

## 2016-08-28 DIAGNOSIS — Z882 Allergy status to sulfonamides status: Secondary | ICD-10-CM | POA: Insufficient documentation

## 2016-08-28 DIAGNOSIS — O26891 Other specified pregnancy related conditions, first trimester: Secondary | ICD-10-CM | POA: Insufficient documentation

## 2016-08-28 DIAGNOSIS — Z87891 Personal history of nicotine dependence: Secondary | ICD-10-CM | POA: Insufficient documentation

## 2016-08-28 DIAGNOSIS — R109 Unspecified abdominal pain: Secondary | ICD-10-CM | POA: Insufficient documentation

## 2016-08-28 DIAGNOSIS — Z3A12 12 weeks gestation of pregnancy: Secondary | ICD-10-CM | POA: Diagnosis not present

## 2016-08-28 DIAGNOSIS — Z711 Person with feared health complaint in whom no diagnosis is made: Secondary | ICD-10-CM

## 2016-08-28 DIAGNOSIS — O368120 Decreased fetal movements, second trimester, not applicable or unspecified: Secondary | ICD-10-CM | POA: Diagnosis not present

## 2016-08-28 LAB — URINALYSIS, ROUTINE W REFLEX MICROSCOPIC
Bilirubin Urine: NEGATIVE
GLUCOSE, UA: NEGATIVE mg/dL
Hgb urine dipstick: NEGATIVE
Ketones, ur: NEGATIVE mg/dL
LEUKOCYTES UA: NEGATIVE
Nitrite: NEGATIVE
PH: 7 (ref 5.0–8.0)
Protein, ur: NEGATIVE mg/dL
Specific Gravity, Urine: 1.015 (ref 1.005–1.030)

## 2016-08-28 LAB — WET PREP, GENITAL
CLUE CELLS WET PREP: NONE SEEN
SPERM: NONE SEEN
Trich, Wet Prep: NONE SEEN
Yeast Wet Prep HPF POC: NONE SEEN

## 2016-08-28 MED ORDER — IBUPROFEN 600 MG PO TABS
600.0000 mg | ORAL_TABLET | Freq: Once | ORAL | Status: AC
  Administered 2016-08-28: 600 mg via ORAL
  Filled 2016-08-28: qty 1

## 2016-08-28 NOTE — MAU Note (Signed)
Not feeling the baby move. Has felt it.  The dr's had asked if she was feeling it yet.   Having sharp pains in lower abd - every since found out preg.  Also had a thick d/c, having itching and burning.

## 2016-08-28 NOTE — Progress Notes (Signed)
E signature page not working, patient signed paper copy.

## 2016-08-28 NOTE — Discharge Instructions (Signed)
How a Baby Grows During Pregnancy Introduction Pregnancy begins when a female's sperm enters a female's egg (fertilization). This happens in one of the tubes (fallopian tubes) that connect the ovaries to the womb (uterus). The fertilized egg is called an embryo until it reaches 10 weeks. From 10 weeks until birth, it is called a fetus. The fertilized egg moves down the fallopian tube to the uterus. Then it implants into the lining of the uterus and begins to grow. The developing fetus receives oxygen and nutrients through the pregnant woman's bloodstream and the tissues that grow (placenta) to support the fetus. The placenta is the life support system for the fetus. It provides nutrition and removes waste. Learning as much as you can about your pregnancy and how your baby is developing can help you enjoy the experience. It can also make you aware of when there might be a problem and when to ask questions. How long does a typical pregnancy last? A pregnancy usually lasts 280 days, or about 40 weeks. Pregnancy is divided into three trimesters:  First trimester: 0-13 weeks.  Second trimester: 14-27 weeks.  Third trimester: 28-40 weeks. The day when your baby is considered ready to be born (full term) is your estimated date of delivery. How does my baby develop month by month? First month  The fertilized egg attaches to the inside of the uterus.  Some cells will form the placenta. Others will form the fetus.  The arms, legs, brain, spinal cord, lungs, and heart begin to develop.  At the end of the first month, the heart begins to beat. Second month  The bones, inner ear, eyelids, hands, and feet form.  The genitals develop.  By the end of 8 weeks, all major organs are developing. Third month  All of the internal organs are forming.  Teeth develop below the gums.  Bones and muscles begin to grow. The spine can flex.  The skin is transparent.  Fingernails and toenails begin to  form.  Arms and legs continue to grow longer, and hands and feet develop.  The fetus is about 3 in (7.6 cm) long. Fourth month  The placenta is completely formed.  The external sex organs, neck, outer ear, eyebrows, eyelids, and fingernails are formed.  The fetus can hear, swallow, and move its arms and legs.  The kidneys begin to produce urine.  The skin is covered with a white waxy coating (vernix) and very fine hair (lanugo). Fifth month  The fetus moves around more and can be felt for the first time (quickening).  The fetus starts to sleep and wake up and may begin to suck its finger.  The nails grow to the end of the fingers.  The organ in the digestive system that makes bile (gallbladder) functions and helps to digest the nutrients.  If your baby is a girl, eggs are present in her ovaries. If your baby is a boy, testicles start to move down into his scrotum. Sixth month  The lungs are formed, but the fetus is not yet able to breathe.  The eyes open. The brain continues to develop.  Your baby has fingerprints and toe prints. Your baby's hair grows thicker.  At the end of the second trimester, the fetus is about 9 in (22.9 cm) long. Seventh month  The fetus kicks and stretches.  The eyes are developed enough to sense changes in light.  The hands can make a grasping motion.  The fetus responds to sound. Eighth month    All organs and body systems are fully developed and functioning.  Bones harden and taste buds develop. The fetus may hiccup.  Certain areas of the brain are still developing. The skull remains soft. Ninth month  The fetus gains about  lb (0.23 kg) each week.  The lungs are fully developed.  Patterns of sleep develop.  The fetus's head typically moves into a head-down position (vertex) in the uterus to prepare for birth. If the buttocks move into a vertex position instead, the baby is breech.  The fetus weighs 6-9 lbs (2.72-4.08 kg) and is  19-20 in (48.26-50.8 cm) long. What can I do to have a healthy pregnancy and help my baby develop?  Eating and Drinking  Eat a healthy diet.  Talk with your health care provider to make sure that you are getting the nutrients that you and your baby need.  Visit www.choosemyplate.gov to learn about creating a healthy diet.  Gain a healthy amount of weight during pregnancy as advised by your health care provider. This is usually 25-35 pounds. You may need to:  Gain more if you were underweight before getting pregnant or if you are pregnant with more than one baby.  Gain less if you were overweight or obese when you got pregnant. Medicines and Vitamins  Take prenatal vitamins as directed by your health care provider. These include vitamins such as folic acid, iron, calcium, and vitamin D. They are important for healthy development.  Take medicines only as directed by your health care provider. Read labels and ask a pharmacist or your health care provider whether over-the-counter medicines, supplements, and prescription drugs are safe to take during pregnancy. Activities  Be physically active as advised by your health care provider. Ask your health care provider to recommend activities that are safe for you to do, such as walking or swimming.  Do not participate in strenuous or extreme sports.  Lifestyle  Do not drink alcohol.  Do not use any tobacco products, including cigarettes, chewing tobacco, or electronic cigarettes. If you need help quitting, ask your health care provider.  Do not use illegal drugs. Safety  Avoid exposure to mercury, lead, or other heavy metals. Ask your health care provider about common sources of these heavy metals.  Avoid listeria infection during pregnancy. Follow these precautions:  Do not eat soft cheeses or deli meats.  Do not eat hot dogs unless they have been warmed up to the point of steaming, such as in the microwave oven.  Do not drink  unpasteurized milk.  Avoid toxoplasmosis infection during pregnancy. Follow these precautions:  Do not change your cat's litter box, if you have a cat. Ask someone else to do this for you.  Wear gardening gloves while working in the yard. General Instructions  Keep all follow-up visits as directed by your health care provider. This is important. This includes prenatal care and screening tests.  Manage any chronic health conditions. Work closely with your health care provider to keep conditions, such as diabetes, under control. How do I know if my baby is developing well? At each prenatal visit, your health care provider will do several different tests to check on your health and keep track of your baby's development. These include:  Fundal height.  Your health care provider will measure your growing belly from top to bottom using a tape measure.  Your health care provider will also feel your belly to determine your baby's position.  Heartbeat.  An ultrasound in the first trimester   can confirm pregnancy and show a heartbeat, depending on how far along you are.  Your health care provider will check your baby's heart rate at every prenatal visit.  As you get closer to your delivery date, you may have regular fetal heart rate monitoring to make sure that your baby is not in distress.  Second trimester ultrasound.  This ultrasound checks your baby's development. It also indicates your baby's gender. What should I do if I have concerns about my baby's development? Always talk with your health care provider about any concerns that you may have. This information is not intended to replace advice given to you by your health care provider. Make sure you discuss any questions you have with your health care provider. Document Released: 03/13/2008 Document Revised: 03/02/2016 Document Reviewed: 03/04/2014  2017 Elsevier  

## 2016-08-28 NOTE — MAU Provider Note (Signed)
History     CSN: [id number]  Arrival date and time: 08/28/16 1818   First Provider Initiated Contact with Patient 08/28/16 1902      Chief Complaint  Patient presents with  . not feeling the baby move   HPI   [name] is a 35 y.o. female [phone] @ 5939w5d here in MAU with abdominal pain, concerns about fetal movements and vaginal discharge. The pain is located in the bottom of her stomach; near the center . The pain comes and goes. She has tried taking tylenol; minimal relief.   The vaginal discharge is thick and white in color, + vaginal itching. Denies vaginal odor.   She denies vaginal bleeding. No history of preterm delivery or labor.   OB History    Gravida Para Term Preterm AB Living   4 3 3    0 3   SAB TAB Ectopic Multiple Live Births     0     3      Past Medical History:  Diagnosis Date  . Back pain   . Gonorrhea   . No pertinent past medical history     Past Surgical History:  Procedure Laterality Date  . INDUCED ABORTION      Family History  Problem Relation Age of Onset  . Hypertension Mother     Social History  Substance Use Topics  . Smoking status: Former Smoker    Packs/day: 0.25    Years: 9.00    Types: Cigarettes    Quit date: 06/09/2012  . Smokeless tobacco: Never Used  . Alcohol use No    Allergies:  Allergies  Allergen Reactions  . Septra Ds [Sulfamethoxazole-Trimethoprim] Anaphylaxis, Swelling, Rash and Other (See Comments)    Reaction:  Facial swelling     Prescriptions Prior to Admission  Medication Sig Dispense Refill Last Dose  . Doxylamine-Pyridoxine (DICLEGIS) 10-10 MG TBEC Take 1 tablet with breakfast and lunch.  Take 2 tablets at bedtime. (Patient not taking: Reported on 08/22/2016) 100 tablet 4 Not Taking  . Prenat-FeCbn-FeAspGl-FA-Omega (OB COMPLETE PETITE) 35-5-1-200 MG CAPS Take 1 tablet by mouth daily. 30 capsule 12   . Prenatal Vit-Fe Fumarate-FA (PRENATAL MULTIVITAMIN) TABS tablet Take 1 tablet by mouth  daily.   Not Taking  . prochlorperazine (COMPAZINE) 10 MG tablet Take 1 tablet (10 mg total) by mouth every 6 (six) hours as needed for nausea or vomiting. (Patient not taking: Reported on 08/22/2016) 30 tablet 0 Not Taking   Results for orders placed or performed during the hospital encounter of 08/28/16 (from the past 48 hour(s))  Urinalysis, Routine w reflex microscopic (not at [facility])     Status: None   Collection Time: 08/28/16  6:36 PM  Result Value Ref Range   Color, Urine YELLOW YELLOW   APPearance CLEAR CLEAR   Specific Gravity, Urine 1.015 1.005 - 1.030   pH 7.0 5.0 - 8.0   Glucose, UA NEGATIVE NEGATIVE mg/dL   Hgb urine dipstick NEGATIVE NEGATIVE   Bilirubin Urine NEGATIVE NEGATIVE   Ketones, ur NEGATIVE NEGATIVE mg/dL   Protein, ur NEGATIVE NEGATIVE mg/dL   Nitrite NEGATIVE NEGATIVE   Leukocytes, UA NEGATIVE NEGATIVE    Comment: MICROSCOPIC NOT DONE ON URINES WITH NEGATIVE PROTEIN, BLOOD, LEUKOCYTES, NITRITE, OR GLUCOSE <1000 mg/dL.  Wet prep, genital     Status: Abnormal   Collection Time: 08/28/16  6:50 PM  Result Value Ref Range   Yeast Wet Prep HPF POC NONE SEEN NONE SEEN   Trich, Wet Prep  NONE SEEN NONE SEEN   Clue Cells Wet Prep HPF POC NONE SEEN NONE SEEN   WBC, Wet Prep HPF POC FEW (A) NONE SEEN    Comment: FEW BACTERIA SEEN   Sperm NONE SEEN     Review of Systems  Constitutional: Negative for chills and fever.  Gastrointestinal: Positive for abdominal pain. Negative for constipation.  Genitourinary: Negative for dysuria and urgency.   Physical Exam   Blood pressure 116/74, pulse 88, temperature 97.8 F (36.6 C), temperature source Oral, resp. rate 16, weight 158 lb 3.2 oz (71.8 kg), last menstrual period 05/26/2016.  Physical Exam  Constitutional: She is oriented to person, place, and time. She appears well-developed and well-nourished. No distress.  HENT:  Head: Normocephalic.  Eyes: Pupils are equal, round, and reactive to light.  GI: Soft. Normal  appearance. There is tenderness in the right lower quadrant, suprapubic area and left lower quadrant. There is no rigidity, no rebound, no guarding and no CVA tenderness.  Musculoskeletal: Normal range of motion.  Neurological: She is alert and oriented to person, place, and time.  Skin: Skin is warm. She is not diaphoretic.  Psychiatric: Her behavior is normal.    MAU Course  Procedures  None  MDM  + heart tones via doppler.   Assessment and Plan   A:  1. Decreased fetal movements in second trimester, single or unspecified fetus   2. Physically well but worried     P:  Discharge home in stable condition Discussed kick counts and when to start  Discussed abdominal pain as a normal finding in pregnancy due to the growing uterus.  Follow up with OB- return to MAU if symptoms worsen   [name], NP 08/30/2016  2:37 PM

## 2016-08-29 ENCOUNTER — Encounter (INDEPENDENT_AMBULATORY_CARE_PROVIDER_SITE_OTHER): Payer: Self-pay

## 2016-08-30 ENCOUNTER — Other Ambulatory Visit: Payer: Self-pay | Admitting: Certified Nurse Midwife

## 2016-08-30 DIAGNOSIS — Z3480 Encounter for supervision of other normal pregnancy, unspecified trimester: Secondary | ICD-10-CM

## 2016-08-30 LAB — MATERNIT21 PLUS CORE+SCA
CHROMOSOME 13: NEGATIVE
Chromosome 18: NEGATIVE
Chromosome 21: NEGATIVE
Y CHROMOSOME: NOT DETECTED

## 2016-09-19 ENCOUNTER — Ambulatory Visit (INDEPENDENT_AMBULATORY_CARE_PROVIDER_SITE_OTHER): Payer: Medicaid Other | Admitting: Obstetrics and Gynecology

## 2016-09-19 VITALS — BP 110/76 | HR 103 | Wt 158.0 lb

## 2016-09-19 DIAGNOSIS — Z3480 Encounter for supervision of other normal pregnancy, unspecified trimester: Secondary | ICD-10-CM

## 2016-09-19 DIAGNOSIS — O9989 Other specified diseases and conditions complicating pregnancy, childbirth and the puerperium: Principal | ICD-10-CM

## 2016-09-19 DIAGNOSIS — M549 Dorsalgia, unspecified: Secondary | ICD-10-CM

## 2016-09-19 DIAGNOSIS — O99891 Other specified diseases and conditions complicating pregnancy: Secondary | ICD-10-CM

## 2016-09-19 DIAGNOSIS — O26892 Other specified pregnancy related conditions, second trimester: Secondary | ICD-10-CM

## 2016-09-19 LAB — POCT URINALYSIS DIPSTICK
Bilirubin, UA: NEGATIVE
Blood, UA: NEGATIVE
Glucose, UA: NEGATIVE
Nitrite, UA: NEGATIVE
PH UA: 6.5
SPEC GRAV UA: 1.01
Urobilinogen, UA: NEGATIVE

## 2016-09-19 NOTE — Patient Instructions (Signed)
Round Ligament Pain Introduction The round ligament is a cord of muscle and tissue that helps to support the uterus. It can become a source of pain during pregnancy if it becomes stretched or twisted as the baby grows. The pain usually begins in the second trimester of pregnancy, and it can come and go until the baby is delivered. It is not a serious problem, and it does not cause harm to the baby. Round ligament pain is usually a short, sharp, and pinching pain, but it can also be a dull, lingering, and aching pain. The pain is felt in the lower side of the abdomen or in the groin. It usually starts deep in the groin and moves up to the outside of the hip area. Pain can occur with:  A sudden change in position.  Rolling over in bed.  Coughing or sneezing.  Physical activity. Follow these instructions at home: Watch your condition for any changes. Take these steps to help with your pain:  When the pain starts, relax. Then try:  Sitting down.  Flexing your knees up to your abdomen.  Lying on your side with one pillow under your abdomen and another pillow between your legs.  Sitting in a warm bath for 15-20 minutes or until the pain goes away.  Take over-the-counter and prescription medicines only as told by your health care provider.  Move slowly when you sit and stand.  Avoid long walks if they cause pain.  Stop or lessen your physical activities if they cause pain. Contact a health care provider if:  Your pain does not go away with treatment.  You feel pain in your back that you did not have before.  Your medicine is not helping. Get help right away if:  You develop a fever or chills.  You develop uterine contractions.  You develop vaginal bleeding.  You develop nausea or vomiting.  You develop diarrhea.  You have pain when you urinate. This information is not intended to replace advice given to you by your health care provider. Make sure you discuss any questions  you have with your health care provider. Document Released: 07/04/2008 Document Revised: 03/02/2016 Document Reviewed: 12/02/2014  2017 [employer]vier  

## 2016-09-19 NOTE — Progress Notes (Signed)
Patient reports she has had pain for 3 weeks. She has been to MAU about it. She states it is worse at some times than others.

## 2016-09-19 NOTE — Progress Notes (Signed)
Subjective:  Jennifer Leach is a 35 y.o. [phone] at 4830w6d being seen today for ongoing prenatal care.  She is currently monitored for the following issues for this low-risk pregnancy and has MRSA (methicillin resistant staph aureus) culture positive and Supervision of normal pregnancy, antepartum on her problem list.  Patient reports vaginal pain.  Contractions: Not present. Vag. Bleeding: Bloody Show.  Movement: Present. Denies leaking of fluid.   The following portions of the patient's history were reviewed and updated as appropriate: allergies, current medications, past family history, past medical history, past social history, past surgical history and problem list. Problem list updated.  Objective:   Vitals:   09/19/16 0937  BP: 110/76  Pulse: (!) 103  Weight: 158 lb (71.7 kg)    Fetal Status: Fetal Heart Rate (bpm): 152   Movement: Present     General:  Alert, oriented and cooperative. Patient is in no acute distress.  Skin: Skin is warm and dry. No rash noted.   Cardiovascular: Normal heart rate noted  Respiratory: Normal respiratory effort, no problems with respiration noted  Abdomen: Soft, gravid, appropriate for gestational age. Pain/Pressure: Present     Pelvic:  Cervical exam performed        Extremities: Normal range of motion.  Edema: None  Mental Status: Normal mood and affect. Normal behavior. Normal judgment and thought content.   Urinalysis: Urine Protein: Trace Urine Glucose: Negative  Assessment and Plan:  Pregnancy: G4P3003 at 5830w6d  1. Back pain affecting pregnancy in second trimester Will check UC - POCT urinalysis dipstick  2. Supervision of other normal pregnancy, antepartum  - US MFM OB DETAIL +14 WK; Future  Preterm labor symptoms and general obstetric precautions including but not limited to vaginal bleeding, contractions, leaking of fluid and fetal movement were reviewed in detail with the patient. Please refer to After Visit Summary for other  counseling recommendations.  Return in about 4 weeks (around 10/17/2016) for OB visit.   Hermina StaggersMichael L Eamonn Sermeno, MD

## 2016-09-20 ENCOUNTER — Inpatient Hospital Stay (HOSPITAL_COMMUNITY)
Admission: AD | Admit: 2016-09-20 | Discharge: 2016-09-20 | Disposition: A | Discharge status: Home / Self Care | Payer: Medicaid Other | Source: Ambulatory Visit | Attending: Family Medicine | Admitting: Family Medicine

## 2016-09-20 ENCOUNTER — Inpatient Hospital Stay (HOSPITAL_COMMUNITY): Payer: Medicaid Other

## 2016-09-20 ENCOUNTER — Telehealth: Payer: Self-pay

## 2016-09-20 ENCOUNTER — Encounter (HOSPITAL_COMMUNITY): Payer: Self-pay | Admitting: Student

## 2016-09-20 DIAGNOSIS — Z882 Allergy status to sulfonamides status: Secondary | ICD-10-CM | POA: Diagnosis not present

## 2016-09-20 DIAGNOSIS — R109 Unspecified abdominal pain: Secondary | ICD-10-CM | POA: Diagnosis present

## 2016-09-20 DIAGNOSIS — Z87891 Personal history of nicotine dependence: Secondary | ICD-10-CM | POA: Diagnosis not present

## 2016-09-20 DIAGNOSIS — O26892 Other specified pregnancy related conditions, second trimester: Secondary | ICD-10-CM | POA: Diagnosis not present

## 2016-09-20 DIAGNOSIS — Z3A16 16 weeks gestation of pregnancy: Secondary | ICD-10-CM

## 2016-09-20 DIAGNOSIS — Y92414 Local residential or business street as the place of occurrence of the external cause: Secondary | ICD-10-CM | POA: Insufficient documentation

## 2016-09-20 LAB — CBC
HEMATOCRIT: 32.2 % — AB (ref 36.0–46.0)
Hemoglobin: 11.8 g/dL — ABNORMAL LOW (ref 12.0–15.0)
MCH: 32.2 pg (ref 26.0–34.0)
MCHC: 36.6 g/dL — AB (ref 30.0–36.0)
MCV: 87.7 fL (ref 78.0–100.0)
PLATELETS: 179 10*3/uL (ref 150–400)
RBC: 3.67 MIL/uL — ABNORMAL LOW (ref 3.87–5.11)
RDW: 12.8 % (ref 11.5–15.5)
WBC: 7.5 10*3/uL (ref 4.0–10.5)

## 2016-09-20 LAB — URINALYSIS, ROUTINE W REFLEX MICROSCOPIC
BILIRUBIN URINE: NEGATIVE
Glucose, UA: NEGATIVE mg/dL
HGB URINE DIPSTICK: NEGATIVE
Ketones, ur: NEGATIVE mg/dL
Leukocytes, UA: NEGATIVE
Nitrite: NEGATIVE
PH: 7 (ref 5.0–8.0)
Protein, ur: NEGATIVE mg/dL
SPECIFIC GRAVITY, URINE: 1.012 (ref 1.005–1.030)

## 2016-09-20 MED ORDER — COMFORT FIT MATERNITY SUPP SM MISC: 1.0000 [IU] | Freq: Every day | 0 refills | Status: DC | PRN

## 2016-09-20 MED ORDER — CYCLOBENZAPRINE HCL 5 MG PO TABS
5.0000 mg | ORAL_TABLET | Freq: Once | ORAL | Status: AC
  Administered 2016-09-20: 5 mg via ORAL
  Filled 2016-09-20: qty 1

## 2016-09-20 MED ORDER — IBUPROFEN 600 MG PO TABS
600.0000 mg | ORAL_TABLET | Freq: Once | ORAL | Status: AC
  Administered 2016-09-20: 600 mg via ORAL
  Filled 2016-09-20: qty 1
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
## 2016-09-20 NOTE — Discharge Instructions (Signed)
Abdominal Pain During Pregnancy  Abdominal pain is common in pregnancy. Most of the time, it does not cause harm. There are many causes of abdominal pain. Some causes are more serious than others and sometimes the cause is not known. Abdominal pain can be a sign that something is very wrong with the pregnancy or the pain may have nothing to do with the pregnancy. Always tell your health care provider if you have any abdominal pain.  Follow these instructions at home:  · Do not have sex or put anything in your vagina until your symptoms go away completely.  · Watch your abdominal pain for any changes.  · Get plenty of rest until your pain improves.  · Drink enough fluid to keep your urine clear or pale yellow.  · Take over-the-counter or prescription medicines only as told by your health care provider.  · Keep all follow-up visits as told by your health care provider. This is important.  Contact a health care provider if:  · You have a fever.  · Your pain gets worse or you have cramping.  · Your pain continues after resting.  Get help right away if:  · You are bleeding, leaking fluid, or passing tissue from the vagina.  · You have vomiting or diarrhea that does not go away.  · You have painful or bloody urination.  · You notice a decrease in your baby's movements.  · You feel very weak or faint.  · You have shortness of breath.  · You develop a severe headache with abdominal pain.  · You have abnormal vaginal discharge with abdominal pain.  This information is not intended to replace advice given to you by your health care provider. Make sure you discuss any questions you have with your health care provider.  Document Released: 09/25/2005 Document Revised: 07/06/2016 Document Reviewed: 04/24/2013  Elsevier Interactive Patient Education © 2017 Elsevier Inc[employer]

## 2016-09-21 LAB — CULTURE, OB URINE

## 2016-09-21 LAB — URINE CULTURE, OB REFLEX

## 2016-10-09 NOTE — L&D Delivery Note (Signed)
Patient is a 35yo female G4 now P4 admitted for SOL 2/2 SROM. Delivery without complications. Patient will be scheduled for BTL.  Delivery Note At 2:30 PM a viable female was delivered via Vaginal, Spontaneous Delivery (Presentation: ROA ).  APGAR:9,9 ; weight pending .   Placenta status: Delivered intact with gentle traction. Cord: 3 vessels with the following complications: None .  Cord pH: not collected  Anesthesia: Epidural   Episiotomy:  None Lacerations:  None Suture Repair: N/A Est. Blood Loss (mL):  100  Mom to postpartum.  Baby to Couplet care / Skin to Skin.  Jennifer NeighboursAbdoulaye Diallo, PGY-1 02/19/2017, 2:45 PM  OB FELLOW DELIVERY ATTESTATION  I was gloved and present for the delivery in its entirety, and I agree with the above resident's note.    Ernestina PennaNicholas Shanicka Oldenkamp, MD 2:56 PM

## 2016-10-17 ENCOUNTER — Ambulatory Visit (INDEPENDENT_AMBULATORY_CARE_PROVIDER_SITE_OTHER): Payer: Medicaid Other | Admitting: Obstetrics and Gynecology

## 2016-10-17 DIAGNOSIS — M549 Dorsalgia, unspecified: Secondary | ICD-10-CM

## 2016-10-17 DIAGNOSIS — O9989 Other specified diseases and conditions complicating pregnancy, childbirth and the puerperium: Secondary | ICD-10-CM

## 2016-10-17 DIAGNOSIS — Z3480 Encounter for supervision of other normal pregnancy, unspecified trimester: Secondary | ICD-10-CM

## 2016-10-17 DIAGNOSIS — O26892 Other specified pregnancy related conditions, second trimester: Secondary | ICD-10-CM

## 2016-10-17 DIAGNOSIS — R899 Unspecified abnormal finding in specimens from other organs, systems and tissues: Secondary | ICD-10-CM

## 2016-10-17 MED ORDER — CYCLOBENZAPRINE HCL 10 MG PO TABS: 10.0000 mg | ORAL_TABLET | Freq: Three times a day (TID) | ORAL | 1 refills | Status: DC | PRN

## 2016-10-17 NOTE — Patient Instructions (Signed)
Back Exercises Introduction If you have pain in your back, do these exercises 2-3 times each day or as told by your doctor. When the pain goes away, do the exercises once each day, but repeat the steps more times for each exercise (do more repetitions). If you do not have pain in your back, do these exercises once each day or as told by your doctor. Exercises Single Knee to Chest  Do these steps 3-5 times in a row for each leg: Lie on your back on a firm bed or the floor with your legs stretched out. Bring one knee to your chest. Hold your knee to your chest by grabbing your knee or thigh. Pull on your knee until you feel a gentle stretch in your lower back. Keep doing the stretch for 10-30 seconds. Slowly let go of your leg and straighten it. Pelvic Tilt  Do these steps 5-10 times in a row: Lie on your back on a firm bed or the floor with your legs stretched out. Bend your knees so they point up to the ceiling. Your feet should be flat on the floor. Tighten your lower belly (abdomen) muscles to press your lower back against the floor. This will make your tailbone point up to the ceiling instead of pointing down to your feet or the floor. Stay in this position for 5-10 seconds while you gently tighten your muscles and breathe evenly. Cat-Cow  Do these steps until your lower back bends more easily: Get on your hands and knees on a firm surface. Keep your hands under your shoulders, and keep your knees under your hips. You may put padding under your knees. Let your head hang down, and make your tailbone point down to the floor so your lower back is round like the back of a cat. Stay in this position for 5 seconds. Slowly lift your head and make your tailbone point up to the ceiling so your back hangs low (sags) like the back of a cow. Stay in this position for 5 seconds. Press-Ups  Do these steps 5-10 times in a row: Lie on your belly (face-down) on the floor. Place your hands near your  head, about shoulder-width apart. While you keep your back relaxed and keep your hips on the floor, slowly straighten your arms to raise the top half of your body and lift your shoulders. Do not use your back muscles. To make yourself more comfortable, you may change where you place your hands. Stay in this position for 5 seconds. Slowly return to lying flat on the floor. Bridges  Do these steps 10 times in a row: Lie on your back on a firm surface. Bend your knees so they point up to the ceiling. Your feet should be flat on the floor. Tighten your butt muscles and lift your butt off of the floor until your waist is almost as high as your knees. If you do not feel the muscles working in your butt and the back of your thighs, slide your feet 1-2 inches farther away from your butt. Stay in this position for 3-5 seconds. Slowly lower your butt to the floor, and let your butt muscles relax. If this exercise is too easy, try doing it with your arms crossed over your chest. Belly Crunches  Do these steps 5-10 times in a row: Lie on your back on a firm bed or the floor with your legs stretched out. Bend your knees so they point up to the ceiling. Your  feet should be flat on the floor. Cross your arms over your chest. Tip your chin a little bit toward your chest but do not bend your neck. Tighten your belly muscles and slowly raise your chest just enough to lift your shoulder blades a tiny bit off of the floor. Slowly lower your chest and your head to the floor. Back Lifts  Do these steps 5-10 times in a row: Lie on your belly (face-down) with your arms at your sides, and rest your forehead on the floor. Tighten the muscles in your legs and your butt. Slowly lift your chest off of the floor while you keep your hips on the floor. Keep the back of your head in line with the curve in your back. Look at the floor while you do this. Stay in this position for 3-5 seconds. Slowly lower your chest and your  face to the floor. Contact a doctor if: Your back pain gets a lot worse when you do an exercise. Your back pain does not lessen 2 hours after you exercise. If you have any of these problems, stop doing the exercises. Do not do them again unless your doctor says it is okay. Get help right away if: You have sudden, very bad back pain. If this happens, stop doing the exercises. Do not do them again unless your doctor says it is okay. This information is not intended to replace advice given to you by your health care provider. Make sure you discuss any questions you have with your health care provider. Document Released: 10/28/2010 Document Revised: 03/02/2016 Document Reviewed: 11/19/2014  2017 Elsevier Back Pain in Pregnancy Introduction Back pain during pregnancy is common. Back pain may be caused by several factors that are related to changes during your pregnancy. Follow these instructions at home: Managing pain, stiffness, and swelling  If directed, apply ice for sudden (acute) back pain.  Put ice in a plastic bag.  Place a towel between your skin and the bag.  Leave the ice on for 20 minutes, 2-3 times per day.  If directed, apply heat to the affected area before you exercise:  Place a towel between your skin and the heat pack or heating pad.  Leave the heat on for 20-30 minutes.  Remove the heat if your skin turns bright red. This is especially important if you are unable to feel pain, heat, or cold. You may have a greater risk of getting burned. Activity  Exercise as told by your health care provider. Exercising is the best way to prevent or manage back pain.  Listen to your body when lifting. If lifting hurts, ask for help or bend your knees. This uses your leg muscles instead of your back muscles.  Squat down when picking up something from the floor. Do not bend over.  Only use bed rest as told by your health care provider. Bed rest should only be used for the most  severe episodes of back pain. Standing, Sitting, and Lying Down  Do not stand in one place for long periods of time.  Use good posture when sitting. Make sure your head rests over your shoulders and is not hanging forward. Use a pillow on your lower back if necessary.  Try sleeping on your side, preferably the left side, with a pillow or two between your legs. If you are sore after a night's rest, your bed may be too soft. A firm mattress may provide more support for your back during pregnancy. General instructions  Do not wear high heels.  Eat a healthy diet. Try to gain weight within your health care provider's recommendations.  Use a maternity girdle, elastic sling, or back brace as told by your health care provider.  Take over-the-counter and prescription medicines only as told by your health care provider.  Keep all follow-up visits as told by your health care provider. This is important. This includes any visits with any specialists, such as a physical therapist. Contact a health care provider if:  Your back pain interferes with your daily activities.  You have increasing pain in other parts of your body. Get help right away if:  You develop numbness, tingling, weakness, or problems with the use of your arms or legs.  You develop severe back pain that is not controlled with medicine.  You have a sudden change in bowel or bladder control.  You develop shortness of breath, dizziness, or you faint.  You develop nausea, vomiting, or sweating.  You have back pain that is a rhythmic, cramping pain similar to labor pains. Labor pain is usually 1-2 minutes apart, lasts for about 1 minute, and involves a bearing down feeling or pressure in your pelvis.  You have back pain and your water breaks or you have vaginal bleeding.  You have back pain or numbness that travels down your leg.  Your back pain developed after you fell.  You develop pain on one side of your back.  You  see blood in your urine.  You develop skin blisters in the area of your back pain. This information is not intended to replace advice given to you by your health care provider. Make sure you discuss any questions you have with your health care provider. Document Released: 01/03/2006 Document Revised: 03/02/2016 Document Reviewed: 06/09/2015  2017 Elsevier

## 2016-10-17 NOTE — Progress Notes (Signed)
Subjective:  Jennifer Leach is a 36 y.o. [phone] at 724w6d being seen today for ongoing prenatal care.  She is currently monitored for the following issues for this low-risk pregnancy and has MRSA (methicillin resistant staph aureus) culture positive; Supervision of normal pregnancy, antepartum; Abnormal laboratory test; and Back pain affecting pregnancy on her problem list.  Patient reports backache.  Contractions: Not present. Vag. Bleeding: None.  Movement: Present. Denies leaking of fluid.   The following portions of the patient's history were reviewed and updated as appropriate: allergies, current medications, past family history, past medical history, past social history, past surgical history and problem list. Problem list updated.  Objective:   Vitals:   10/17/16 0858  BP: 100/70  Pulse: 75  Weight: 162 lb 6.4 oz (73.7 kg)    Fetal Status: Fetal Heart Rate (bpm): 139   Movement: Present     General:  Alert, oriented and cooperative. Patient is in no acute distress.  Skin: Skin is warm and dry. No rash noted.   Cardiovascular: Normal heart rate noted  Respiratory: Normal respiratory effort, no problems with respiration noted  Abdomen: Soft, gravid, appropriate for gestational age. Pain/Pressure: Present     Pelvic:  Cervical exam deferred        Extremities: Normal range of motion.  Edema: None  Mental Status: Normal mood and affect. Normal behavior. Normal judgment and thought content.   Urinalysis:      Assessment and Plan:  Pregnancy: G4P3003 at 8624w6d  1. Supervision of other normal pregnancy, antepartum Anatomy scan next week Desire BTL, sign papers at next visit 2. Abnormal laboratory test  - TSH  3. Back pain affecting pregnancy in second trimester Note for work to limit lifting to 25# - cyclobenzaprine (FLEXERIL) 10 MG tablet; Take 1 tablet (10 mg total) by mouth every 8 (eight) hours as needed for muscle spasms.  Dispense: 30 tablet; Refill: 1  Preterm labor  symptoms and general obstetric precautions including but not limited to vaginal bleeding, contractions, leaking of fluid and fetal movement were reviewed in detail with the patient. Please refer to After Visit Summary for other counseling recommendations.  Return in about 4 weeks (around 11/14/2016) for OB visit.   Hermina StaggersMichael L Ervin, MD

## 2016-10-17 NOTE — Progress Notes (Signed)
Patient complains of constant back pain, reports fetal movement.

## 2016-10-18 LAB — TSH: TSH: 0.573 u[IU]/mL (ref 0.450–4.500)

## 2016-10-23 ENCOUNTER — Inpatient Hospital Stay (HOSPITAL_COMMUNITY)
Admission: AD | Admit: 2016-10-23 | Discharge: 2016-10-23 | Disposition: A | Discharge status: Home / Self Care | Payer: Medicaid Other | Source: Ambulatory Visit | Attending: Obstetrics & Gynecology | Admitting: Obstetrics & Gynecology

## 2016-10-23 ENCOUNTER — Other Ambulatory Visit: Payer: Self-pay | Admitting: Obstetrics and Gynecology

## 2016-10-23 ENCOUNTER — Encounter (HOSPITAL_COMMUNITY): Payer: Self-pay | Admitting: *Deleted

## 2016-10-23 ENCOUNTER — Ambulatory Visit (HOSPITAL_COMMUNITY)
Admission: RE | Admit: 2016-10-23 | Discharge: 2016-10-23 | Disposition: A | Discharge status: Home / Self Care | Payer: Medicaid Other | Source: Ambulatory Visit | Attending: Obstetrics and Gynecology | Admitting: Obstetrics and Gynecology

## 2016-10-23 DIAGNOSIS — Z363 Encounter for antenatal screening for malformations: Secondary | ICD-10-CM | POA: Diagnosis not present

## 2016-10-23 DIAGNOSIS — M549 Dorsalgia, unspecified: Secondary | ICD-10-CM | POA: Insufficient documentation

## 2016-10-23 DIAGNOSIS — Z8249 Family history of ischemic heart disease and other diseases of the circulatory system: Secondary | ICD-10-CM | POA: Diagnosis not present

## 2016-10-23 DIAGNOSIS — Z3A20 20 weeks gestation of pregnancy: Secondary | ICD-10-CM

## 2016-10-23 DIAGNOSIS — O26892 Other specified pregnancy related conditions, second trimester: Secondary | ICD-10-CM | POA: Insufficient documentation

## 2016-10-23 DIAGNOSIS — Z3480 Encounter for supervision of other normal pregnancy, unspecified trimester: Secondary | ICD-10-CM

## 2016-10-23 DIAGNOSIS — Z87891 Personal history of nicotine dependence: Secondary | ICD-10-CM | POA: Diagnosis not present

## 2016-10-23 DIAGNOSIS — Z882 Allergy status to sulfonamides status: Secondary | ICD-10-CM | POA: Diagnosis not present

## 2016-10-23 DIAGNOSIS — R109 Unspecified abdominal pain: Secondary | ICD-10-CM | POA: Diagnosis not present

## 2016-10-23 DIAGNOSIS — N9489 Other specified conditions associated with female genital organs and menstrual cycle: Secondary | ICD-10-CM

## 2016-10-23 LAB — URINALYSIS, ROUTINE W REFLEX MICROSCOPIC
BACTERIA UA: NONE SEEN
BILIRUBIN URINE: NEGATIVE
Glucose, UA: NEGATIVE mg/dL
HGB URINE DIPSTICK: NEGATIVE
Ketones, ur: NEGATIVE mg/dL
Leukocytes, UA: NEGATIVE
NITRITE: NEGATIVE
PROTEIN: 100 mg/dL — AB
Specific Gravity, Urine: 1.021 (ref 1.005–1.030)
WBC UA: NONE SEEN WBC/hpf (ref 0–5)
pH: 8 (ref 5.0–8.0)

## 2016-10-23 MED ORDER — IBUPROFEN 600 MG PO TABS
600.0000 mg | ORAL_TABLET | Freq: Once | ORAL | Status: AC
  Administered 2016-10-23: 600 mg via ORAL
  Filled 2016-10-23: qty 1

## 2016-10-23 NOTE — Discharge Instructions (Signed)
Second Trimester of Pregnancy The second trimester is from week 13 through week 28, month 4 through 6. This is often the time in pregnancy that you feel your best. Often times, morning sickness has lessened or quit. You may have more energy, and you may get hungry more often. Your unborn baby (fetus) is growing rapidly. At the end of the sixth month, he or she is about 9 inches long and weighs about 1 pounds. You will likely feel the baby move (quickening) between 18 and 20 weeks of pregnancy. Follow these instructions at home:  Avoid all smoking, herbs, and alcohol. Avoid drugs not approved by your doctor.  Do not use any tobacco products, including cigarettes, chewing tobacco, and electronic cigarettes. If you need help quitting, ask your doctor. You may get counseling or other support to help you quit.  Only take medicine as told by your doctor. Some medicines are safe and some are not during pregnancy.  Exercise only as told by your doctor. Stop exercising if you start having cramps.  Eat regular, healthy meals.  Wear a good support bra if your breasts are tender.  Do not use hot tubs, steam rooms, or saunas.  Wear your seat belt when driving.  Avoid raw meat, uncooked cheese, and liter boxes and soil used by cats.  Take your prenatal vitamins.  Take 1500-2000 milligrams of calcium daily starting at the 20th week of pregnancy until you deliver your baby.  Try taking medicine that helps you poop (stool softener) as needed, and if your doctor approves. Eat more fiber by eating fresh fruit, vegetables, and whole grains. Drink enough fluids to keep your pee (urine) clear or pale yellow.  Take warm water baths (sitz baths) to soothe pain or discomfort caused by hemorrhoids. Use hemorrhoid cream if your doctor approves.  If you have puffy, bulging veins (varicose veins), wear support hose. Raise (elevate) your feet for 15 minutes, 3-4 times a day. Limit salt in your diet.  Avoid heavy  lifting, wear low heals, and sit up straight.  Rest with your legs raised if you have leg cramps or low back pain.  Visit your dentist if you have not gone during your pregnancy. Use a soft toothbrush to brush your teeth. Be gentle when you floss.  You can have sex (intercourse) unless your doctor tells you not to.  Go to your doctor visits. Get help if:  You feel dizzy.  You have mild cramps or pressure in your lower belly (abdomen).  You have a nagging pain in your belly area.  You continue to feel sick to your stomach (nauseous), throw up (vomit), or have watery poop (diarrhea).  You have bad smelling fluid coming from your vagina.  You have pain with peeing (urination). Get help right away if:  You have a fever.  You are leaking fluid from your vagina.  You have spotting or bleeding from your vagina.  You have severe belly cramping or pain.  You lose or gain weight rapidly.  You have trouble catching your breath and have chest pain.  You notice sudden or extreme puffiness (swelling) of your face, hands, ankles, feet, or legs.  You have not felt the baby move in over an hour.  You have severe headaches that do not go away with medicine.  You have vision changes. This information is not intended to replace advice given to you by your health care provider. Make sure you discuss any questions you have with your health care   provider. Document Released: 12/20/2009 Document Revised: 03/02/2016 Document Reviewed: 11/26/2012 Elsevier Interactive Patient Education  2017 [employer]  

## 2016-10-23 NOTE — MAU Note (Signed)
Pt states she has been having contractions since Friday, back pain, abdominal tightening.  Had a stomach virus on Friday & Saturday.  Hasn't vomited since Saturday.  Denies bleeding or LOF.

## 2016-10-23 NOTE — MAU Provider Note (Signed)
Chief Complaint:  Back Pain and abdominal tightening   First Provider Initiated Contact with Patient 10/23/16 1730     HPI: Jennifer Leach is a 36 y.o. G4P3003 at 2120w5dwho presents to maternity admissions reporting uterine cramping since Friday with low back pain.   Had nausea, vomiting and diarrhea Saturday and Sunday. None today. . She reports good fetal movement, denies LOF, vaginal bleeding, vaginal itching/burning, urinary symptoms, h/a, dizziness, n/v, diarrhea, constipation or fever/chills.    Back Pain  This is a new problem. The current episode started today. The problem occurs intermittently. The problem is unchanged. The pain is present in the lumbar spine. The quality of the pain is described as cramping. The pain is mild. The pain is the same all the time. Stiffness is present all day. Associated symptoms include abdominal pain and pelvic pain. Pertinent negatives include no dysuria, fever, tingling or weakness. She has tried nothing for the symptoms.  Abdominal Pain  This is a new problem. The current episode started today. The onset quality is gradual. The problem has been unchanged. The pain is located in the suprapubic region, LLQ and RLQ. The pain is mild. The quality of the pain is cramping. The abdominal pain radiates to the back. Pertinent negatives include no constipation, diarrhea (But did have this yesterday), dysuria, fever, nausea or vomiting (Did have this weekend). Nothing aggravates the pain. The pain is relieved by nothing. She has tried nothing for the symptoms.   RN Note: Pt states she has been having contractions since Friday, back pain, abdominal tightening.  Had a stomach virus on Friday & Saturday.  Hasn't vomited since Saturday.  Denies bleeding or LOF.  Past Medical History: Past Medical History:  Diagnosis Date  . Gonorrhea     Past obstetric history: OB History  Gravida Para Term Preterm AB Living  4 3 3    0 3  SAB TAB Ectopic Multiple Live Births     0     3    # Outcome Date GA Lbr Len/2nd Weight Sex Delivery Anes PTL Lv  4 Current           3 Term 09/03/10    M Vag-Spont   LIV  2 Term 09/27/06    F Vag-Spont   LIV  1 Term 07/03/02    Rolm BaptiseM Vag-Spont   LIV      Past Surgical History: Past Surgical History:  Procedure Laterality Date  . INDUCED ABORTION      Family History: Family History  Problem Relation Age of Onset  . Hypertension Mother     Social History: Social History  Substance Use Topics  . Smoking status: Former Smoker    Packs/day: 0.25    Years: 9.00    Types: Cigarettes    Quit date: 06/09/2012  . Smokeless tobacco: Former NeurosurgeonUser  . Alcohol use No    Allergies:  Allergies  Allergen Reactions  . Septra Ds [Sulfamethoxazole-Trimethoprim] Anaphylaxis, Swelling, Rash and Other (See Comments)    Reaction:  Facial swelling     Meds:  Prescriptions Prior to Admission  Medication Sig Dispense Refill Last Dose  . Ca Carbonate-Mag Hydroxide (ROLAIDS PO) Take 2 tablets by mouth daily as needed (heartburn).   Past Week at Unknown time  . cyclobenzaprine (FLEXERIL) 10 MG tablet Take 1 tablet (10 mg total) by mouth every 8 (eight) hours as needed for muscle spasms. 30 tablet 1 Past Month at Unknown time  . acetaminophen (TYLENOL) 325 MG tablet Take  325 mg by mouth every 6 (six) hours as needed for moderate pain.   Not Taking at Unknown time  . Elastic Bandages & Supports (COMFORT FIT MATERNITY SUPP SM) MISC 1 Units by Does not apply route daily as needed. 1 each 0 Not Taking  . Prenat-FeCbn-FeAspGl-FA-Omega (OB COMPLETE PETITE) 35-5-1-200 MG CAPS Take 1 tablet by mouth daily. (Patient not taking: Reported on 10/23/2016) 30 capsule 12 Not Taking at Unknown time  . prochlorperazine (COMPAZINE) 10 MG tablet TAKE 1 TABLET EVERY 6 HOURS AS NEEDED FOR NAUSEA AND VOMITING  0 Not Taking at Unknown time    I have reviewed patient's Past Medical Hx, Surgical Hx, Family Hx, Social Hx, medications and allergies.   ROS:  Review  of Systems  Constitutional: Negative for fever.  Gastrointestinal: Positive for abdominal pain. Negative for constipation, diarrhea (But did have this yesterday), nausea and vomiting (Did have this weekend).  Genitourinary: Positive for pelvic pain. Negative for dysuria.  Musculoskeletal: Positive for back pain.  Neurological: Negative for tingling and weakness.   Other systems negative  Physical Exam  Patient Vitals for the past 24 hrs:  BP Temp Temp src Pulse Resp  10/23/16 1611 105/66 97.5 F (36.4 C) Oral 82 18   Constitutional: Well-developed, well-nourished female in no acute distress.  Cardiovascular: normal rate and rhythm Respiratory: normal effort, clear to auscultation bilaterally GI: Abd soft, non-tender, gravid appropriate for gestational age.   No rebound or guarding. MS: Extremities nontender, no edema, normal ROM Neurologic: Alert and oriented x 4.  GU: Neg CVAT.   Cervix long and closed  FHT:  Baseline 145  Contractions:  Irritability   Labs: Results for orders placed or performed during the hospital encounter of 10/23/16 (from the past 24 hour(s))  Urinalysis, Routine w reflex microscopic     Status: Abnormal   Collection Time: 10/23/16  4:15 PM  Result Value Ref Range   Color, Urine YELLOW YELLOW   APPearance CLOUDY (A) CLEAR   Specific Gravity, Urine 1.021 1.005 - 1.030   pH 8.0 5.0 - 8.0   Glucose, UA NEGATIVE NEGATIVE mg/dL   Hgb urine dipstick NEGATIVE NEGATIVE   Bilirubin Urine NEGATIVE NEGATIVE   Ketones, ur NEGATIVE NEGATIVE mg/dL   Protein, ur 409 (A) NEGATIVE mg/dL   Nitrite NEGATIVE NEGATIVE   Leukocytes, UA NEGATIVE NEGATIVE   RBC / HPF 0-5 0 - 5 RBC/hpf   WBC, UA NONE SEEN 0 - 5 WBC/hpf   Bacteria, UA NONE SEEN NONE SEEN   Squamous Epithelial / LPF 0-5 (A) NONE SEEN   Mucous PRESENT    A/Positive/-- (10/17 1614)  Imaging:  [place] Mfm Ob Comp + 14 Wk  Result Date:  10/23/2016 ----------------------------------------------------------------------  OBSTETRICS REPORT                      (Signed Final 10/23/2016 03:45 pm) ---------------------------------------------------------------------- Patient Info  ID #:       [id number]                          D.O.B.:  1981/01/22 (35 yrs)  Name:       Jennifer Rea                Visit Date: 10/23/2016 10:16 am ---------------------------------------------------------------------- Performed By  Performed By:     Tommi Emery         Ref. Address:     [address]  [facility]                                                             [address]                                                             [place], [place]                                                             [address]  Attending:        Charlsie Merles MD         Location:         [facility]  Referred By:      [name]                    MD ---------------------------------------------------------------------- Orders   #  Description                                 Code   1  [place] MFM OB COMP + 14 WK                      76805.01  ----------------------------------------------------------------------   #  Ordered By               Order #        Accession #    Episode #   1  Nettie Elm            [id number]      [id number]     [id number]  ---------------------------------------------------------------------- Indications   [redacted] weeks gestation of pregnancy                Z3A.20   Encounter for antenatal screening for          Z36.3   malformations  ---------------------------------------------------------------------- OB History  Gravidity:    4         Term:   3  Living:       3 ---------------------------------------------------------------------- Fetal Evaluation  Num Of Fetuses:     1  Fetal Heart         150  Rate(bpm):  Cardiac Activity:   Observed  Presentation:       Cephalic  Placenta:           Posterior, above cervical os  P. Cord Insertion:   Visualized  Amniotic Fluid  AFI FV:      Subjectively increased                              Largest Pocket(cm)  7.2  RUQ(cm)  7.2 ---------------------------------------------------------------------- Biometry  BPD:      51.4  mm     G. Age:  21w 4d         82  %    CI:        77.97   %    70 - 86                                                          FL/HC:      18.8   %    15.9 - 20.3  HC:      184.2  mm     G. Age:  20w 6d         45  %    HC/AC:      1.08        1.06 - 1.25  AC:      171.1  mm     G. Age:  22w 1d         83  %    FL/BPD:     67.5   %  FL:       34.7  mm     G. Age:  20w 6d         49  %    FL/AC:      20.3   %    20 - 24  HUM:      33.1  mm     G. Age:  21w 1d         61  %  CER:      21.6  mm     G. Age:  20w 4d         47  %  CM:        3.8  mm  Est. FW:     428  gm    0 lb 15 oz      57  % ---------------------------------------------------------------------- Gestational Age  U/S Today:     21w 3d                                        EDD:   03/02/17  Best:          20w 5d     Det. By:  Marcella Dubs         EDD:   03/07/17                                      (07/10/16) ---------------------------------------------------------------------- Anatomy  Cranium:               Appears normal         Aortic Arch:            Appears normal  Cavum:                 Not well visualized    Ductal Arch:            Appears normal  Ventricles:            Appears normal  Diaphragm:              Appears normal  Choroid Plexus:        Appears normal         Stomach:                Appears normal, left                                                                        sided  Cerebellum:            Appears normal         Abdomen:                Appears normal  Posterior Fossa:       Appears normal         Abdominal Wall:         Appears nml (cord                                                                        insert, abd wall)  Nuchal Fold:           Not  applicable (>20    Cord Vessels:           Appears normal ([redacted]                         wks GA)                                        vessel cord)  Face:                  Orbits nl; profile not Kidneys:                Appear normal                         well visualized  Lips:                  Appears normal         Bladder:                Appears normal  Thoracic:              Appears normal         Spine:                  Appears normal  Heart:                 Appears normal         Upper Extremities:      Appears normal                         (4CH, axis, and situs  RVOT:                  Appears normal         Lower Extremities:      Appears normal  LVOT:                  Appears normal  Other:  Fetus appears to be a female. Heels and 5th digit visualized.          Technically difficult due to fetal position. ---------------------------------------------------------------------- Cervix Uterus Adnexa  Cervix  Length:            3.8  cm.  Normal appearance by transabdominal scan.  Uterus  No abnormality visualized.  Left Ovary  Within normal limits.  Right Ovary  Within normal limits. ---------------------------------------------------------------------- Impression  Singleton intrauterine pregnancy at 20+5 weeks, here for  anatomic survey  Review of the anatomy shows no sonographic markers for  aneuploidy or structural anomalies  Amniotic fluid volume is normal with a MVP of 7.4 cm, but  subjectively the level is high  Estimated fetal weight is 428g which is growth in the 57th  percentile ---------------------------------------------------------------------- Recommendations  Consider repeat scan in 6 weeks to assess growth and  amniotic fluid levels ----------------------------------------------------------------------                Charlsie Merles, MD Electronically Signed Final Report   10/23/2016 03:45 pm ----------------------------------------------------------------------   MAU Course/MDM: I have ordered  labs and reviewed results.  NST reviewed Uterine irritability noted, which patient reports as crampy One dose of ibuprofen given which did alleviate most cramping. Still has an occasional cramps.   Urine reflects no evidence of dehydration.  Assessment: SIUP at [redacted]w[redacted]d Uterine irritability, likely related to GI virus yesterday and day before.   Plan: Discharge home Preterm Labor precautions and fetal kick counts Follow up in Office for prenatal visits and recheck of status  Encouraged to return here or to other Urgent Care/ED if she develops worsening of symptoms, increase in pain, fever, or other concerning symptoms.   Pt stable at time of discharge.  Wynelle Bourgeois CNM, MSN Certified Nurse-Midwife 10/23/2016 5:30 PM

## 2016-10-24 ENCOUNTER — Other Ambulatory Visit: Payer: Self-pay | Admitting: *Deleted

## 2016-10-24 DIAGNOSIS — Z3492 Encounter for supervision of normal pregnancy, unspecified, second trimester: Secondary | ICD-10-CM

## 2016-10-24 NOTE — Progress Notes (Signed)
U/s ordered per Dr Alysia PennaErvin request.

## 2016-11-14 ENCOUNTER — Encounter: Payer: Medicaid Other | Admitting: Obstetrics and Gynecology

## 2016-11-16 ENCOUNTER — Telehealth: Payer: Self-pay

## 2016-11-16 NOTE — Telephone Encounter (Signed)
Pt called c/o Bh's. She said they are not painful but she didn't have Bh's this early during last pregnancy. Pt aware pregnancies are all different. She is drinking gallon of water daily. Pt aware if painful ctx's every 3-5 mins or less, Vb, DEC Fm, and or leaking fluid, to contact the office. Pt agrees and has no further questions.

## 2016-11-20 ENCOUNTER — Inpatient Hospital Stay (HOSPITAL_COMMUNITY)
Admission: AD | Admit: 2016-11-20 | Discharge: 2016-11-20 | Disposition: A | Discharge status: Home / Self Care | Payer: Medicaid Other | Source: Ambulatory Visit | Attending: Obstetrics and Gynecology | Admitting: Obstetrics and Gynecology

## 2016-11-20 ENCOUNTER — Encounter (HOSPITAL_COMMUNITY): Payer: Self-pay

## 2016-11-20 DIAGNOSIS — Z87891 Personal history of nicotine dependence: Secondary | ICD-10-CM | POA: Diagnosis not present

## 2016-11-20 DIAGNOSIS — Z3A24 24 weeks gestation of pregnancy: Secondary | ICD-10-CM | POA: Diagnosis not present

## 2016-11-20 DIAGNOSIS — Z882 Allergy status to sulfonamides status: Secondary | ICD-10-CM | POA: Diagnosis not present

## 2016-11-20 DIAGNOSIS — Z8249 Family history of ischemic heart disease and other diseases of the circulatory system: Secondary | ICD-10-CM | POA: Diagnosis not present

## 2016-11-20 DIAGNOSIS — O4702 False labor before 37 completed weeks of gestation, second trimester: Secondary | ICD-10-CM | POA: Diagnosis not present

## 2016-11-20 DIAGNOSIS — R109 Unspecified abdominal pain: Secondary | ICD-10-CM | POA: Diagnosis present

## 2016-11-20 DIAGNOSIS — O479 False labor, unspecified: Secondary | ICD-10-CM

## 2016-11-20 LAB — URINALYSIS, ROUTINE W REFLEX MICROSCOPIC
Bilirubin Urine: NEGATIVE
GLUCOSE, UA: NEGATIVE mg/dL
Hgb urine dipstick: NEGATIVE
KETONES UR: NEGATIVE mg/dL
LEUKOCYTES UA: NEGATIVE
NITRITE: NEGATIVE
PROTEIN: NEGATIVE mg/dL
Specific Gravity, Urine: 1.018 (ref 1.005–1.030)
pH: 6 (ref 5.0–8.0)

## 2016-11-20 LAB — WET PREP, GENITAL
CLUE CELLS WET PREP: NONE SEEN
Sperm: NONE SEEN
TRICH WET PREP: NONE SEEN
WBC, Wet Prep HPF POC: NONE SEEN
Yeast Wet Prep HPF POC: NONE SEEN

## 2016-11-20 LAB — FETAL FIBRONECTIN: Fetal Fibronectin: NEGATIVE

## 2016-11-20 NOTE — MAU Note (Signed)
Patient presents with c/o abdominal pressure for the past month. Patient also states that she is having sharp vaginal pain.

## 2016-11-20 NOTE — MAU Provider Note (Signed)
Patient Jennifer Leach is a 36 year old G4P3003 at 24 weeks and 5 days here with complaints of contractions and feeling of vaginal pressure. The abdominal pain started about a month ago and the vaginal pressure started a few days ago.  History     CSN: [id number]  Arrival date and time: 11/20/16 1552   First Provider Initiated Contact with Patient 11/20/16 1625      Chief Complaint  Patient presents with  . Abdominal Pain   Abdominal Pain  This is a chronic problem. The current episode started 1 to 4 weeks ago. The onset quality is gradual. The problem occurs constantly. The pain is located in the periumbilical region and generalized abdominal region. The pain is at a severity of 8/10. The quality of the pain is cramping and sharp. The abdominal pain does not radiate. Pertinent negatives include no anorexia, arthralgias, belching, constipation, diarrhea, dysuria, fever, flatus, frequency, headaches, hematochezia, hematuria, melena, myalgias, nausea, vomiting or weight loss. The pain is aggravated by movement. The pain is relieved by being still. She has tried nothing for the symptoms.    OB History    Gravida Para Term Preterm AB Living   4 3 3    0 3   SAB TAB Ectopic Multiple Live Births     0     3      Past Medical History:  Diagnosis Date  . Gonorrhea     Past Surgical History:  Procedure Laterality Date  . INDUCED ABORTION      Family History  Problem Relation Age of Onset  . Hypertension Mother     Social History  Substance Use Topics  . Smoking status: Former Smoker    Packs/day: 0.25    Years: 9.00    Types: Cigarettes    Quit date: 06/09/2012  . Smokeless tobacco: Former NeurosurgeonUser  . Alcohol use No    Allergies:  Allergies  Allergen Reactions  . Septra Ds [Sulfamethoxazole-Trimethoprim] Anaphylaxis, Swelling, Rash and Other (See Comments)    Reaction:  Facial swelling     Prescriptions Prior to Admission  Medication Sig Dispense Refill Last Dose  .  acetaminophen (TYLENOL) 325 MG tablet Take 325 mg by mouth every 6 (six) hours as needed for moderate pain.   2 Month at Unknown time  . calcium carbonate (TUMS - DOSED IN MG ELEMENTAL CALCIUM) 500 MG chewable tablet Chew 2 tablets by mouth 2 (two) times daily as needed for indigestion or heartburn.   11/20/2016 at Unknown time  . cyclobenzaprine (FLEXERIL) 10 MG tablet Take 1 tablet (10 mg total) by mouth every 8 (eight) hours as needed for muscle spasms. 30 tablet 1 Past Week at Unknown time  . Prenatal Vit-Fe Fumarate-FA (PRENATAL MULTIVITAMIN) TABS tablet Take 1 tablet by mouth daily at 12 noon.   11/20/2016 at Unknown time  . Prenat-FeCbn-FeAspGl-FA-Omega (OB COMPLETE PETITE) 35-5-1-200 MG CAPS Take 1 tablet by mouth daily. (Patient not taking: Reported on 10/23/2016) 30 capsule 12 Not Taking at Unknown time    Review of Systems  Constitutional: Negative.  Negative for fever and weight loss.  Eyes: Negative.   Respiratory: Negative.   Cardiovascular: Negative.   Gastrointestinal: Positive for abdominal pain. Negative for anorexia, constipation, diarrhea, flatus, hematochezia, melena, nausea and vomiting.  Endocrine: Negative.   Genitourinary: Negative for dysuria, frequency and hematuria.  Musculoskeletal: Negative for arthralgias and myalgias.  Allergic/Immunologic: Negative.   Neurological: Negative for headaches.  Hematological: Negative.   Psychiatric/Behavioral: Negative.    Physical  Exam   Blood pressure 118/69, pulse 81, temperature 97.8 F (36.6 C), temperature source Oral, resp. rate 18, last menstrual period 05/26/2016, SpO2 100 %.  Physical Exam  Constitutional: She is oriented to person, place, and time. She appears well-developed.  HENT:  Head: Normocephalic.  Eyes: Pupils are equal, round, and reactive to light.  Neck: Normal range of motion.  Respiratory: Effort normal.  GI: Soft.  Genitourinary:  Genitourinary Comments: NEFG. Vaginal walls pink with no discharge;  cervix is pink with no discharge. Closed long and thick.   Musculoskeletal: Normal range of motion.  Neurological: She is alert and oriented to person, place, and time.  Skin: Skin is warm and dry.  Psychiatric: She has a normal mood and affect.    MAU Course  Procedures  MDM --NST -reactive baseline 145 with positive accels, moderate variability, negative decelerations. Some mild uterine contractions on the monitor but none palpated.  -Ffn negative -wet prep-negative -UA: no signs of dehydration  Assessment and Plan   1. Braxton Hick's contraction    2. Patient stable for D/C. Reassured her of the normalcy of the vaginal pressure and Braxton Hicks contractions in labor. Reviewed labor precuations and when to return to the MAU (bleeding, leaking of fluid, regular contractions). Patient will keep her appointment for prenatal care on Friday, 2/16  Charlesetta Garibaldi Caoilainn Sacks CNM 11/20/2016, 4:55 PM

## 2016-11-21 ENCOUNTER — Encounter: Payer: Medicaid Other | Admitting: Obstetrics & Gynecology

## 2016-11-21 LAB — GC/CHLAMYDIA PROBE AMP (~~LOC~~) NOT AT ARMC
Chlamydia: NEGATIVE
NEISSERIA GONORRHEA: NEGATIVE

## 2016-11-24 ENCOUNTER — Inpatient Hospital Stay (HOSPITAL_COMMUNITY)
Admission: AD | Admit: 2016-11-24 | Discharge: 2016-11-24 | Disposition: A | Discharge status: Home / Self Care | Payer: Medicaid Other | Source: Ambulatory Visit | Attending: Family Medicine | Admitting: Family Medicine

## 2016-11-24 ENCOUNTER — Encounter (HOSPITAL_COMMUNITY): Payer: Self-pay

## 2016-11-24 ENCOUNTER — Ambulatory Visit (INDEPENDENT_AMBULATORY_CARE_PROVIDER_SITE_OTHER): Payer: Medicaid Other | Admitting: Certified Nurse Midwife

## 2016-11-24 VITALS — BP 109/73 | HR 88 | Wt 162.7 lb

## 2016-11-24 DIAGNOSIS — Z3A00 Weeks of gestation of pregnancy not specified: Secondary | ICD-10-CM | POA: Diagnosis not present

## 2016-11-24 DIAGNOSIS — Z22322 Carrier or suspected carrier of Methicillin resistant Staphylococcus aureus: Secondary | ICD-10-CM

## 2016-11-24 DIAGNOSIS — Z3480 Encounter for supervision of other normal pregnancy, unspecified trimester: Secondary | ICD-10-CM

## 2016-11-24 DIAGNOSIS — O479 False labor, unspecified: Secondary | ICD-10-CM | POA: Insufficient documentation

## 2016-11-24 DIAGNOSIS — Z3482 Encounter for supervision of other normal pregnancy, second trimester: Secondary | ICD-10-CM

## 2016-11-24 LAB — URINALYSIS, ROUTINE W REFLEX MICROSCOPIC
Bilirubin Urine: NEGATIVE
GLUCOSE, UA: NEGATIVE mg/dL
HGB URINE DIPSTICK: NEGATIVE
KETONES UR: NEGATIVE mg/dL
LEUKOCYTES UA: NEGATIVE
Nitrite: NEGATIVE
PH: 8 (ref 5.0–8.0)
Protein, ur: NEGATIVE mg/dL
Specific Gravity, Urine: 1.016 (ref 1.005–1.030)

## 2016-11-24 MED ORDER — LACTATED RINGERS IV BOLUS (SEPSIS)
1000.0000 mL | Freq: Once | INTRAVENOUS | Status: AC
  Administered 2016-11-24: 1000 mL via INTRAVENOUS

## 2016-11-24 NOTE — MAU Note (Signed)
Pt sent from MD office for contractions, was told she would get IV fluids & medication.  Has been having uc's for the past 2 months, was in MAU on Monday, wasn't dilated.  Cervix was not checked in office today.  Denies bleeding or LOF.

## 2016-11-24 NOTE — Discharge Instructions (Signed)
Braxton Hicks Contractions °Contractions of the uterus can occur throughout pregnancy. Contractions are not always a sign that you are in labor.  °WHAT ARE BRAXTON HICKS CONTRACTIONS?  °Contractions that occur before labor are called Braxton Hicks contractions, or false labor. Toward the end of pregnancy (32-34 weeks), these contractions can develop more often and may become more forceful. This is not true labor because these contractions do not result in opening (dilatation) and thinning of the cervix. They are sometimes difficult to tell apart from true labor because these contractions can be forceful and people have different pain tolerances. You should not feel embarrassed if you go to the hospital with false labor. Sometimes, the only way to tell if you are in true labor is for your health care provider to look for changes in the cervix. °If there are no prenatal problems or other health problems associated with the pregnancy, it is completely safe to be sent home with false labor and await the onset of true labor. °HOW CAN YOU TELL THE DIFFERENCE BETWEEN TRUE AND FALSE LABOR? °False Labor  °· The contractions of false labor are usually shorter and not as hard as those of true labor.   °· The contractions are usually irregular.   °· The contractions are often felt in the front of the lower abdomen and in the groin.   °· The contractions may go away when you walk around or change positions while lying down.   °· The contractions get weaker and are shorter lasting as time goes on.   °· The contractions do not usually become progressively stronger, regular, and closer together as with true labor.   °True Labor  °· Contractions in true labor last 30-70 seconds, become very regular, usually become more intense, and increase in frequency.   °· The contractions do not go away with walking.   °· The discomfort is usually felt in the top of the uterus and spreads to the lower abdomen and low back.   °· True labor can be  determined by your health care provider with an exam. This will show that the cervix is dilating and getting thinner.   °WHAT TO REMEMBER °· Keep up with your usual exercises and follow other instructions given by your health care provider.   °· Take medicines as directed by your health care provider.   °· Keep your regular prenatal appointments.   °· Eat and drink lightly if you think you are going into labor.   °· If Braxton Hicks contractions are making you uncomfortable:   °¨ Change your position from lying down or resting to walking, or from walking to resting.   °¨ Sit and rest in a tub of warm water.   °¨ Drink 2-3 glasses of water. Dehydration may cause these contractions.   °¨ Do slow and deep breathing several times an hour.   °WHEN SHOULD I SEEK IMMEDIATE MEDICAL CARE? °Seek immediate medical care if: °· Your contractions become stronger, more regular, and closer together.   °· You have fluid leaking or gushing from your vagina.   °· You have a fever.   °· You pass blood-tinged mucus.   °· You have vaginal bleeding.   °· You have continuous abdominal pain.   °· You have low back pain that you never had before.   °· You feel your baby's head pushing down and causing pelvic pressure.   °· Your baby is not moving as much as it used to.   °This information is not intended to replace advice given to you by your health care provider. Make sure you discuss any questions you have with your health care   provider. °Document Released: 09/25/2005 Document Revised: 01/17/2016 Document Reviewed: 07/07/2013 °Elsevier Interactive Patient Education © 2017 [employer] ° °

## 2016-11-25 LAB — THYROID PANEL
FREE THYROXINE INDEX: 1.6 (ref 1.2–4.9)
T3 Uptake Ratio: 21 % — ABNORMAL LOW (ref 24–39)
T4 TOTAL: 7.5 ug/dL (ref 4.5–12.0)

## 2016-11-25 NOTE — Progress Notes (Signed)
   PRENATAL VISIT NOTE  Subjective:  Jennifer Leach is a 36 y.o. [phone] at 3934w3d being seen today for ongoing prenatal care.  She is currently monitored for the following issues for this low-risk pregnancy and has MRSA (methicillin resistant staph aureus) culture positive; Supervision of normal pregnancy, antepartum; Abnormal laboratory test; and Back pain affecting pregnancy on her problem list.  Patient reports no complaints.  Contractions: Irregular. Vag. Bleeding: None.  Movement: Present. Denies leaking of fluid.   The following portions of the patient's history were reviewed and updated as appropriate: allergies, current medications, past family history, past medical history, past social history, past surgical history and problem list. Problem list updated.  Objective:   Vitals:   11/24/16 1054  BP: 109/73  Pulse: 88  Weight: 162 lb 11.2 oz (73.8 kg)    Fetal Status: Fetal Heart Rate (bpm): 142 Fundal Height: 27 cm Movement: Present     General:  Alert, oriented and cooperative. Patient is in no acute distress.  Skin: Skin is warm and dry. No rash noted.   Cardiovascular: Normal heart rate noted  Respiratory: Normal respiratory effort, no problems with respiration noted  Abdomen: Soft, gravid, appropriate for gestational age. Pain/Pressure: Present     Pelvic:  Cervical exam deferred        Extremities: Normal range of motion.  Edema: None  Mental Status: Normal mood and affect. Normal behavior. Normal judgment and thought content.   Assessment and Plan:  Pregnancy: G4P3003 at 6634w3d  1. Supervision of other normal pregnancy, antepartum     F/U on low TSH done.  WNL.   - Thyroid Profile  2. MRSA (methicillin resistant staph aureus) culture positive    Culture positive in 2015.    Preterm labor symptoms and general obstetric precautions including but not limited to vaginal bleeding, contractions, leaking of fluid and fetal movement were reviewed in detail with the  patient. Please refer to After Visit Summary for other counseling recommendations.  Return in about 3 weeks (around 12/15/2016) for ROB.   Roe Coombsachelle A Denney, CNM

## 2016-11-27 ENCOUNTER — Other Ambulatory Visit: Payer: Self-pay | Admitting: Obstetrics and Gynecology

## 2016-11-27 ENCOUNTER — Ambulatory Visit (HOSPITAL_COMMUNITY)
Admission: RE | Admit: 2016-11-27 | Discharge: 2016-11-27 | Disposition: A | Discharge status: Home / Self Care | Payer: Medicaid Other | Source: Ambulatory Visit | Attending: Obstetrics and Gynecology | Admitting: Obstetrics and Gynecology

## 2016-11-27 DIAGNOSIS — Z3A25 25 weeks gestation of pregnancy: Secondary | ICD-10-CM

## 2016-11-27 DIAGNOSIS — Z3689 Encounter for other specified antenatal screening: Secondary | ICD-10-CM

## 2016-11-27 DIAGNOSIS — Z0371 Encounter for suspected problem with amniotic cavity and membrane ruled out: Secondary | ICD-10-CM | POA: Insufficient documentation

## 2016-11-27 DIAGNOSIS — Z3492 Encounter for supervision of normal pregnancy, unspecified, second trimester: Secondary | ICD-10-CM

## 2016-12-06 ENCOUNTER — Telehealth: Payer: Self-pay | Admitting: *Deleted

## 2016-12-06 NOTE — Telephone Encounter (Signed)
Patient feels like her vagina is swollen and wants to know if that is normal. Told patient she is feeling the pressure of the pregnancy, increased blood flow, and her nerves are more sensitive at this time. She may feel sharp pains at times- and she says she does. Told her she needs to let us know if she has a change in her discharge- thin and watery. She is aware.

## 2016-12-13 ENCOUNTER — Inpatient Hospital Stay (HOSPITAL_COMMUNITY)
Admission: AD | Admit: 2016-12-13 | Discharge: 2016-12-13 | Disposition: A | Discharge status: Home / Self Care | Payer: Medicaid Other | Source: Ambulatory Visit | Attending: Obstetrics & Gynecology | Admitting: Obstetrics & Gynecology

## 2016-12-13 ENCOUNTER — Encounter (HOSPITAL_COMMUNITY): Payer: Self-pay | Admitting: *Deleted

## 2016-12-13 DIAGNOSIS — O4703 False labor before 37 completed weeks of gestation, third trimester: Secondary | ICD-10-CM | POA: Insufficient documentation

## 2016-12-13 DIAGNOSIS — Z87891 Personal history of nicotine dependence: Secondary | ICD-10-CM | POA: Insufficient documentation

## 2016-12-13 DIAGNOSIS — O479 False labor, unspecified: Secondary | ICD-10-CM | POA: Diagnosis not present

## 2016-12-13 DIAGNOSIS — R102 Pelvic and perineal pain: Secondary | ICD-10-CM | POA: Diagnosis not present

## 2016-12-13 DIAGNOSIS — Z3A28 28 weeks gestation of pregnancy: Secondary | ICD-10-CM | POA: Diagnosis not present

## 2016-12-13 DIAGNOSIS — N949 Unspecified condition associated with female genital organs and menstrual cycle: Secondary | ICD-10-CM

## 2016-12-13 DIAGNOSIS — Z882 Allergy status to sulfonamides status: Secondary | ICD-10-CM | POA: Diagnosis not present

## 2016-12-13 DIAGNOSIS — R109 Unspecified abdominal pain: Secondary | ICD-10-CM | POA: Diagnosis present

## 2016-12-13 LAB — URINALYSIS, ROUTINE W REFLEX MICROSCOPIC
Bilirubin Urine: NEGATIVE
Glucose, UA: NEGATIVE mg/dL
Hgb urine dipstick: NEGATIVE
Ketones, ur: NEGATIVE mg/dL
LEUKOCYTES UA: NEGATIVE
Nitrite: NEGATIVE
PROTEIN: NEGATIVE mg/dL
Specific Gravity, Urine: 1.016 (ref 1.005–1.030)
pH: 7 (ref 5.0–8.0)

## 2016-12-13 MED ORDER — NIFEDIPINE 10 MG PO CAPS: 10.0000 mg | ORAL_CAPSULE | Freq: Four times a day (QID) | ORAL | 0 refills | Status: DC | PRN

## 2016-12-13 MED ORDER — NIFEDIPINE 10 MG PO CAPS
10.0000 mg | ORAL_CAPSULE | Freq: Once | ORAL | Status: AC
  Administered 2016-12-13: 10 mg via ORAL
  Filled 2016-12-13: qty 1

## 2016-12-13 NOTE — MAU Provider Note (Signed)
History     CSN: [id number]  Arrival date and time: 12/13/16 1504   First Provider Initiated Contact with Patient 12/13/16 1558      No chief complaint on file.  HPI   [name] is a 36 y.o.female [id number] @ [redacted]w[redacted]d here in MAU with contraction like pain. The pain is described as tightening pain her abdomen that worsens with movement. At times the pain takes her breath away. She feels the pain everyday, however the pain comes and goes. Denies vaginal bleeding or leaking of fluid. + fetal movement.  Symptoms worsened after intercourse yesterday.   OB History    Gravida Para Term Preterm AB Living   4 3 3    0 3   SAB TAB Ectopic Multiple Live Births     0     3      Past Medical History:  Diagnosis Date  . Gonorrhea     Past Surgical History:  Procedure Laterality Date  . INDUCED ABORTION      Family History  Problem Relation Age of Onset  . Hypertension Mother     Social History  Substance Use Topics  . Smoking status: Former Smoker    Packs/day: 0.25    Years: 9.00    Types: Cigarettes    Quit date: 06/09/2012  . Smokeless tobacco: Former Neurosurgeon  . Alcohol use No    Allergies:  Allergies  Allergen Reactions  . Septra Ds [Sulfamethoxazole-Trimethoprim] Anaphylaxis, Swelling, Rash and Other (See Comments)    Reaction:  Facial swelling     Prescriptions Prior to Admission  Medication Sig Dispense Refill Last Dose  . calcium carbonate (TUMS - DOSED IN MG ELEMENTAL CALCIUM) 500 MG chewable tablet Chew 2 tablets by mouth 2 (two) times daily as needed for indigestion or heartburn.   12/13/2016 at Unknown time  . Prenatal Vit-Fe Fumarate-FA (PRENATAL MULTIVITAMIN) TABS tablet Take 1 tablet by mouth daily at 12 noon.   Past Week at Unknown time  . acetaminophen (TYLENOL) 325 MG tablet Take 325 mg by mouth every 6 (six) hours as needed for moderate pain.   prn  . cyclobenzaprine (FLEXERIL) 10 MG tablet Take 1 tablet (10 mg total) by mouth every 8 (eight) hours as  needed for muscle spasms. (Patient not taking: Reported on 12/13/2016) 30 tablet 1 Not Taking at Unknown time   Results for orders placed or performed during the hospital encounter of 12/13/16 (from the past 48 hour(s))  Urinalysis, Routine w reflex microscopic     Status: None   Collection Time: 12/13/16  3:15 PM  Result Value Ref Range   Color, Urine YELLOW YELLOW   APPearance CLEAR CLEAR   Specific Gravity, Urine 1.016 1.005 - 1.030   pH 7.0 5.0 - 8.0   Glucose, UA NEGATIVE NEGATIVE mg/dL   Hgb urine dipstick NEGATIVE NEGATIVE   Bilirubin Urine NEGATIVE NEGATIVE   Ketones, ur NEGATIVE NEGATIVE mg/dL   Protein, ur NEGATIVE NEGATIVE mg/dL   Nitrite NEGATIVE NEGATIVE   Leukocytes, UA NEGATIVE NEGATIVE   Review of Systems  Constitutional: Negative for fever.  Gastrointestinal: Positive for abdominal pain (Contraction pain ).  Genitourinary: Negative for flank pain and vaginal bleeding.   Physical Exam   Blood pressure 98/55, pulse 97, temperature 98.1 F (36.7 C), temperature source Oral, resp. rate 16, height 5\' 7"  (1.702 m), weight 171 lb (77.6 kg), last menstrual period 05/26/2016, SpO2 100 %.  Physical Exam  Constitutional: She appears well-developed and well-nourished. No distress.  HENT:  Head: Normocephalic.  Eyes: Pupils are equal, round, and reactive to light.  Genitourinary:  Genitourinary Comments: Dilation: Closed Effacement (%): Thick Cervical Position: Posterior Station:  (high) Exam by:: J Aliviah Spain NP  Musculoskeletal: Normal range of motion.  Neurological: She is alert.  Skin: Skin is warm. She is not diaphoretic.  Psychiatric: Her behavior is normal.   Fetal Tracing: Baseline: 145 bpm  Variability: moderate  Accelerations: 10x10 Decelerations: none Toco: one contraction  MAU Course  Procedures  None  MDM  Unable to collect FFN due to recent intercourse in the last 24 hours.  Procardia 10 mg given PO  Cervix rechecked and is the same/ unchanged .    Patient requesting procardia for home use.   Assessment and Plan   A:  1. Braxton Hicks contractions   2. Round ligament pain     P:  Discharge home in stable condition Strict return precautions Patient has OB appointment on Friday RX: Procardia PRN, # 10 [facility]. Preterm labor precautions.  Pelvic rest Work note provided   Jennifer LopeJennifer I Korinna Tat, NP 12/13/2016 8:26 PM

## 2016-12-13 NOTE — MAU Note (Signed)
Pt reports pressure in her vaginal area for a week, also reports she has had contractions off/on for months but now the pain is sharp instead of tightening.

## 2016-12-14 ENCOUNTER — Telehealth: Payer: Self-pay

## 2016-12-14 NOTE — Telephone Encounter (Signed)
Returned call, no answer, left vm 

## 2016-12-15 ENCOUNTER — Encounter: Payer: Self-pay | Admitting: Certified Nurse Midwife

## 2016-12-15 ENCOUNTER — Ambulatory Visit (INDEPENDENT_AMBULATORY_CARE_PROVIDER_SITE_OTHER): Payer: Medicaid Other | Admitting: Certified Nurse Midwife

## 2016-12-15 ENCOUNTER — Other Ambulatory Visit: Payer: Medicaid Other

## 2016-12-15 VITALS — BP 109/72 | HR 87 | Wt 172.6 lb

## 2016-12-15 DIAGNOSIS — O99891 Other specified diseases and conditions complicating pregnancy: Secondary | ICD-10-CM

## 2016-12-15 DIAGNOSIS — Z3492 Encounter for supervision of normal pregnancy, unspecified, second trimester: Secondary | ICD-10-CM

## 2016-12-15 DIAGNOSIS — M549 Dorsalgia, unspecified: Secondary | ICD-10-CM

## 2016-12-15 DIAGNOSIS — O26892 Other specified pregnancy related conditions, second trimester: Secondary | ICD-10-CM

## 2016-12-15 DIAGNOSIS — O9989 Other specified diseases and conditions complicating pregnancy, childbirth and the puerperium: Secondary | ICD-10-CM

## 2016-12-15 DIAGNOSIS — Z3483 Encounter for supervision of other normal pregnancy, third trimester: Secondary | ICD-10-CM

## 2016-12-15 DIAGNOSIS — Z3480 Encounter for supervision of other normal pregnancy, unspecified trimester: Secondary | ICD-10-CM

## 2016-12-15 DIAGNOSIS — Z22322 Carrier or suspected carrier of Methicillin resistant Staphylococcus aureus: Secondary | ICD-10-CM

## 2016-12-15 DIAGNOSIS — O4703 False labor before 37 completed weeks of gestation, third trimester: Secondary | ICD-10-CM

## 2016-12-15 DIAGNOSIS — R12 Heartburn: Secondary | ICD-10-CM

## 2016-12-15 MED ORDER — ONDANSETRON HCL 8 MG PO TABS: 8.0000 mg | ORAL_TABLET | Freq: Three times a day (TID) | ORAL | 2 refills | Status: DC | PRN

## 2016-12-15 MED ORDER — NIFEDIPINE 10 MG PO CAPS: 10.0000 mg | ORAL_CAPSULE | Freq: Four times a day (QID) | ORAL | 2 refills | Status: DC | PRN

## 2016-12-15 MED ORDER — OMEPRAZOLE 20 MG PO CPDR: 20.0000 mg | DELAYED_RELEASE_CAPSULE | Freq: Two times a day (BID) | ORAL | 5 refills | Status: DC

## 2016-12-15 NOTE — Progress Notes (Signed)
   PRENATAL VISIT NOTE  Subjective:  Jennifer Leach is a 36 y.o. G4P3003 at 2133w2d being seen today for ongoing prenatal care.  She is currently monitored for the following issues for this low-risk pregnancy and has MRSA (methicillin resistant staph aureus) culture positive; Supervision of normal pregnancy, antepartum; Abnormal laboratory test; and Back pain affecting pregnancy on her problem list.  Patient reports heartburn, no bleeding, no leaking, occasional contractions and was seen in MAU for preterm contractions. 12/13/16:procardia given at MAU; patient has not picked up Rx today.    Contractions: Irritability. Vag. Bleeding: None.  Movement: Present. Denies leaking of fluid.   The following portions of the patient's history were reviewed and updated as appropriate: allergies, current medications, past family history, past medical history, past social history, past surgical history and problem list. Problem list updated.  Objective:   Vitals:   12/15/16 0854  BP: 109/72  Pulse: 87  Weight: 172 lb 9.6 oz (78.3 kg)    Fetal Status:     Movement: Present     General:  Alert, oriented and cooperative. Patient is in no acute distress.  Skin: Skin is warm and dry. No rash noted.   Cardiovascular: Normal heart rate noted  Respiratory: Normal respiratory effort, no problems with respiration noted  Abdomen: Soft, gravid, appropriate for gestational age. Pain/Pressure: Present     Pelvic:  Cervical exam deferred        Extremities: Normal range of motion.  Edema: None  Mental Status: Normal mood and affect. Normal behavior. Normal judgment and thought content.   Assessment and Plan:  Pregnancy: G4P3003 at 4133w2d  1. Encounter for supervision of normal pregnancy in second trimester, unspecified gravidity     Preterm contractions.  States that she has a few per hour, increased while at work.  Not currently taking Procardia.    - Glucose Tolerance, 2 Hours w/1 Hour - CBC - HIV antibody  (with reflex) - RPR   2. MRSA (methicillin resistant staph aureus) culture positive     Previously treated.   3. Back pain affecting pregnancy in second trimester  4. Preterm contractions     Work note written.  Patient is refusing to stop working.  Work hours reduced.  Strongly encouraged to take Procardia.  States that she is drinking water.       Preterm labor symptoms and general obstetric precautions including but not limited to vaginal bleeding, contractions, leaking of fluid and fetal movement were reviewed in detail with the patient. Please refer to After Visit Summary for other counseling recommendations.  Return in about 2 weeks (around 12/29/2016) for ROB.   Roe Coombsachelle A Jayon Matton, CNM

## 2016-12-15 NOTE — Addendum Note (Signed)
Addended by: Samantha CrimesENNEY, Fletcher Rathbun ANNE on: 12/15/2016 11:59 AM   Modules accepted: Orders

## 2016-12-16 LAB — GLUCOSE TOLERANCE, 2 HOURS W/ 1HR
GLUCOSE, 1 HOUR: 100 mg/dL (ref 65–179)
GLUCOSE, 2 HOUR: 95 mg/dL (ref 65–152)
Glucose, Fasting: 78 mg/dL (ref 65–91)

## 2016-12-16 LAB — CBC
HEMATOCRIT: 34 % (ref 34.0–46.6)
Hemoglobin: 11.5 g/dL (ref 11.1–15.9)
MCH: 30.4 pg (ref 26.6–33.0)
MCHC: 33.8 g/dL (ref 31.5–35.7)
MCV: 90 fL (ref 79–97)
PLATELETS: 162 10*3/uL (ref 150–379)
RBC: 3.78 x10E6/uL (ref 3.77–5.28)
RDW: 14.1 % (ref 12.3–15.4)
WBC: 7.6 10*3/uL (ref 3.4–10.8)

## 2016-12-16 LAB — RPR: RPR: NONREACTIVE

## 2016-12-16 LAB — HIV ANTIBODY (ROUTINE TESTING W REFLEX): HIV Screen 4th Generation wRfx: NONREACTIVE

## 2016-12-18 ENCOUNTER — Other Ambulatory Visit: Payer: Self-pay | Admitting: Certified Nurse Midwife

## 2016-12-18 DIAGNOSIS — Z3480 Encounter for supervision of other normal pregnancy, unspecified trimester: Secondary | ICD-10-CM

## 2016-12-29 ENCOUNTER — Encounter: Payer: Self-pay | Admitting: Certified Nurse Midwife

## 2016-12-29 ENCOUNTER — Encounter (HOSPITAL_COMMUNITY): Payer: Self-pay | Admitting: *Deleted

## 2016-12-29 ENCOUNTER — Other Ambulatory Visit (HOSPITAL_COMMUNITY)
Admission: RE | Admit: 2016-12-29 | Discharge: 2016-12-29 | Disposition: A | Discharge status: Home / Self Care | Payer: Medicaid Other | Source: Ambulatory Visit | Attending: Certified Nurse Midwife | Admitting: Certified Nurse Midwife

## 2016-12-29 ENCOUNTER — Inpatient Hospital Stay (HOSPITAL_COMMUNITY)
Admission: AD | Admit: 2016-12-29 | Discharge: 2016-12-29 | Disposition: A | Discharge status: Home / Self Care | Payer: Medicaid Other | Source: Ambulatory Visit | Attending: Obstetrics & Gynecology | Admitting: Obstetrics & Gynecology

## 2016-12-29 ENCOUNTER — Ambulatory Visit (INDEPENDENT_AMBULATORY_CARE_PROVIDER_SITE_OTHER): Payer: Medicaid Other | Admitting: Certified Nurse Midwife

## 2016-12-29 VITALS — BP 105/71 | HR 84 | Temp 97.1°F | Wt 169.8 lb

## 2016-12-29 DIAGNOSIS — B373 Candidiasis of vulva and vagina: Secondary | ICD-10-CM | POA: Insufficient documentation

## 2016-12-29 DIAGNOSIS — O9989 Other specified diseases and conditions complicating pregnancy, childbirth and the puerperium: Secondary | ICD-10-CM | POA: Diagnosis not present

## 2016-12-29 DIAGNOSIS — Z3689 Encounter for other specified antenatal screening: Secondary | ICD-10-CM | POA: Diagnosis not present

## 2016-12-29 DIAGNOSIS — Z3A30 30 weeks gestation of pregnancy: Secondary | ICD-10-CM | POA: Diagnosis not present

## 2016-12-29 DIAGNOSIS — N898 Other specified noninflammatory disorders of vagina: Secondary | ICD-10-CM | POA: Diagnosis not present

## 2016-12-29 DIAGNOSIS — Z882 Allergy status to sulfonamides status: Secondary | ICD-10-CM | POA: Insufficient documentation

## 2016-12-29 DIAGNOSIS — O26893 Other specified pregnancy related conditions, third trimester: Secondary | ICD-10-CM | POA: Diagnosis not present

## 2016-12-29 DIAGNOSIS — B3731 Acute candidiasis of vulva and vagina: Secondary | ICD-10-CM

## 2016-12-29 DIAGNOSIS — Z3480 Encounter for supervision of other normal pregnancy, unspecified trimester: Secondary | ICD-10-CM

## 2016-12-29 DIAGNOSIS — Z87891 Personal history of nicotine dependence: Secondary | ICD-10-CM | POA: Insufficient documentation

## 2016-12-29 DIAGNOSIS — Z3483 Encounter for supervision of other normal pregnancy, third trimester: Secondary | ICD-10-CM

## 2016-12-29 DIAGNOSIS — O98813 Other maternal infectious and parasitic diseases complicating pregnancy, third trimester: Secondary | ICD-10-CM

## 2016-12-29 DIAGNOSIS — Z8249 Family history of ischemic heart disease and other diseases of the circulatory system: Secondary | ICD-10-CM | POA: Diagnosis not present

## 2016-12-29 MED ORDER — FLUCONAZOLE 150 MG PO TABS: 150.0000 mg | ORAL_TABLET | Freq: Once | ORAL | 0 refills | Status: AC

## 2016-12-29 NOTE — MAU Note (Signed)
Urine in lab 

## 2016-12-29 NOTE — MAU Provider Note (Signed)
History     CSN: [id number]  Arrival date and time: 12/29/16 4098   First Provider Initiated Contact with Patient 12/29/16 1925      Chief Complaint  Patient presents with  . Contractions  . Rupture of Membranes   Jennifer Leach is a 36 y.o G50P3003 female who presents with leakage of fluid.  Patient saw her OB this morning, had pelvic exam performed, at that time had no c/o leakage of fluid and was diagnosed with yeast infection.  Since the appointment and exam, patient notes fluid leakage making her underwear feel wet.  Feels fetal movement.  No nausea, vomiting, headaches, dysuria, hematuria, flank pain, vaginal bleeding.  Denies vaginal discharge.  Reports contractions that are intermittent, have been occurring for many weeks, above umbilicus.  Notes some vaginal itchyness d/t yeast infection.     OB History    Gravida Para Term Preterm AB Living   4 3 3    0 3   SAB TAB Ectopic Multiple Live Births     0     3      Past Medical History:  Diagnosis Date  . Gonorrhea     History reviewed. No pertinent surgical history.  Family History  Problem Relation Age of Onset  . Hypertension Mother     Social History  Substance Use Topics  . Smoking status: Former Smoker    Packs/day: 0.25    Years: 9.00    Types: Cigarettes    Quit date: 06/09/2012  . Smokeless tobacco: Former Neurosurgeon  . Alcohol use No    Allergies:  Allergies  Allergen Reactions  . Septra Ds [Sulfamethoxazole-Trimethoprim] Anaphylaxis, Swelling, Rash and Other (See Comments)    Reaction:  Facial swelling     Prescriptions Prior to Admission  Medication Sig Dispense Refill Last Dose  . acetaminophen (TYLENOL) 325 MG tablet Take 325 mg by mouth every 6 (six) hours as needed for moderate pain.   Not Taking  . calcium carbonate (TUMS - DOSED IN MG ELEMENTAL CALCIUM) 500 MG chewable tablet Chew 2 tablets by mouth 2 (two) times daily as needed for indigestion or heartburn.   Not Taking  . cyclobenzaprine  (FLEXERIL) 10 MG tablet Take 1 tablet (10 mg total) by mouth every 8 (eight) hours as needed for muscle spasms. (Patient not taking: Reported on 12/13/2016) 30 tablet 1 Not Taking  . fluconazole (DIFLUCAN) 150 MG tablet Take 1 tablet (150 mg total) by mouth once. Repeat dose in 48-72 hours 2 tablet 0   . NIFEdipine (PROCARDIA) 10 MG capsule Take 1 capsule (10 mg total) by mouth every 6 (six) hours as needed. (Patient not taking: Reported on 12/29/2016) 120 capsule 2 Not Taking  . omeprazole (PRILOSEC) 20 MG capsule Take 1 capsule (20 mg total) by mouth 2 (two) times daily before a meal. 60 capsule 5 Taking  . Prenatal Vit-Fe Fumarate-FA (PRENATAL MULTIVITAMIN) TABS tablet Take 1 tablet by mouth daily at 12 noon.   Taking    Review of Systems  Constitutional: Negative for chills and fever.  Cardiovascular: Negative for chest pain, palpitations and leg swelling.  Gastrointestinal: Positive for abdominal pain (Braxton-Hicks contractions) and nausea (chronic, d/t pregnancy). Negative for constipation, diarrhea and vomiting.  Genitourinary: Negative for dysuria, flank pain, hematuria, vaginal bleeding and vaginal discharge.  Musculoskeletal: Negative for back pain.  Neurological: Negative for headaches.   Physical Exam   Blood pressure 122/72, pulse 100, temperature 97.1 F (36.2 C), temperature source Oral, resp. rate 16,  height 5\' 7"  (1.702 m), weight 77 kg (169 lb 12.8 oz), last menstrual period 05/26/2016, SpO2 100 %.  Physical Exam  Constitutional: She is oriented to person, place, and time. She appears well-developed and well-nourished. No distress.  HENT:  Head: Normocephalic and atraumatic.  Cardiovascular: Normal rate, regular rhythm and normal heart sounds.  Exam reveals no gallop and no friction rub.   No murmur heard. Respiratory: Effort normal and breath sounds normal. No respiratory distress. She has no wheezes. She has no rales.  GI: Soft. Bowel sounds are normal. Distention: gravid  uterus. There is no tenderness. There is no rebound and no guarding.  Genitourinary: There is no rash, tenderness, lesion or injury on the right labia. There is no rash, tenderness, lesion or injury on the left labia. Cervix exhibits discharge (thin, milky discharge, no significant pooling). Cervix exhibits no motion tenderness. No erythema, tenderness or bleeding in the vagina. No signs of injury around the vagina. Vaginal discharge found.  Genitourinary Comments: No pool, fern neg  Musculoskeletal: She exhibits no edema.  Neurological: She is alert and oriented to person, place, and time.  Skin: Skin is warm and dry.  Psychiatric: She has a normal mood and affect. Her behavior is normal.   EFM: 145 bpm, mod variability, + accels, rare variable decels Toco: none  Results for orders placed or performed in visit on 12/29/16 (from the past 24 hour(s))  Fetal fibronectin     Status: None   Collection Time: 12/29/16 10:50 AM  Result Value Ref Range   Fetal Fibronectin Negative Negative   Narrative   Performed at:  [address] [place] [address], [place], [place]  [id number] Lab Director: Mila Homer MD, Phone:  [phone]   MAU Course  Procedures: none  MDM Labs ordered and reviewed. FFN was collected in office and negative. Wet prep was also collected and pending. No evidence of SROM or PTL. Stable for discharge home.  Assessment and Plan   1. [redacted] weeks gestation of pregnancy   2. NST (non-stress test) reactive   3. Leukorrhea     Discharge to home Follow up in office as scheduled PTL precautions  Allergies as of 12/29/2016      Reactions   Septra Ds [sulfamethoxazole-trimethoprim] Anaphylaxis, Swelling, Rash, Other (See Comments)   Reaction:  Facial swelling       Medication List    TAKE these medications   acetaminophen 325 MG tablet Commonly known as:  TYLENOL Take 325 mg by mouth every 6 (six) hours as needed for moderate pain.   calcium carbonate 500 MG  chewable tablet Commonly known as:  TUMS - dosed in mg elemental calcium Chew 2 tablets by mouth 2 (two) times daily as needed for indigestion or heartburn.   cyclobenzaprine 10 MG tablet Commonly known as:  FLEXERIL Take 1 tablet (10 mg total) by mouth every 8 (eight) hours as needed for muscle spasms.   fluconazole 150 MG tablet Commonly known as:  DIFLUCAN Take 1 tablet (150 mg total) by mouth once. Repeat dose in 48-72 hours   NIFEdipine 10 MG capsule Commonly known as:  PROCARDIA Take 1 capsule (10 mg total) by mouth every 6 (six) hours as needed.   omeprazole 20 MG capsule Commonly known as:  PRILOSEC Take 1 capsule (20 mg total) by mouth 2 (two) times daily before a meal.   prenatal multivitamin Tabs tablet Take 1 tablet by mouth daily at 12 noon.      Gavin Pound  [place], Medical Student 12/29/2016, 7:36 PM   I confirm that I have verified the information documented in the student's note and that I have also personally reperformed the physical exam and all medical decision making activities.  Donette LarryMelanie Shiesha Jahn, CNM  12/29/2016 8:21 PM

## 2016-12-29 NOTE — MAU Note (Signed)
Patient c/o leaking clear fluid; states had to change her underwear 3 times today. Denies vaginal bleeding. +FM. Was seen in the office today and tested for preterm labor.

## 2016-12-29 NOTE — Progress Notes (Signed)
Patient reports good fetal movement, denies contractions. 

## 2016-12-29 NOTE — Discharge Instructions (Signed)
Third Trimester of Pregnancy °The third trimester is from week 29 through week 42, months 7 through 9. This trimester is when your unborn baby (fetus) is growing very fast. At the end of the ninth month, the unborn baby is about 20 inches in length. It weighs about 6-10 pounds. °Follow these instructions at home: °· Avoid all smoking, herbs, and alcohol. Avoid drugs not approved by your doctor. °· Do not use any tobacco products, including cigarettes, chewing tobacco, and electronic cigarettes. If you need help quitting, ask your doctor. You may get counseling or other support to help you quit. °· Only take medicine as told by your doctor. Some medicines are safe and some are not during pregnancy. °· Exercise only as told by your doctor. Stop exercising if you start having cramps. °· Eat regular, healthy meals. °· Wear a good support bra if your breasts are tender. °· Do not use hot tubs, steam rooms, or saunas. °· Wear your seat belt when driving. °· Avoid raw meat, uncooked cheese, and liter boxes and soil used by cats. °· Take your prenatal vitamins. °· Take 1500-2000 milligrams of calcium daily starting at the 20th week of pregnancy until you deliver your baby. °· Try taking medicine that helps you poop (stool softener) as needed, and if your doctor approves. Eat more fiber by eating fresh fruit, vegetables, and whole grains. Drink enough fluids to keep your pee (urine) clear or pale yellow. °· Take warm water baths (sitz baths) to soothe pain or discomfort caused by hemorrhoids. Use hemorrhoid cream if your doctor approves. °· If you have puffy, bulging veins (varicose veins), wear support hose. Raise (elevate) your feet for 15 minutes, 3-4 times a day. Limit salt in your diet. °· Avoid heavy lifting, wear low heels, and sit up straight. °· Rest with your legs raised if you have leg cramps or low back pain. °· Visit your dentist if you have not gone during your pregnancy. Use a soft toothbrush to brush your  teeth. Be gentle when you floss. °· You can have sex (intercourse) unless your doctor tells you not to. °· Do not travel far distances unless you must. Only do so with your doctor's approval. °· Take prenatal classes. °· Practice driving to the hospital. °· Pack your hospital bag. °· Prepare the baby's room. °· Go to your doctor visits. °Get help if: °· You are not sure if you are in labor or if your water has broken. °· You are dizzy. °· You have mild cramps or pressure in your lower belly (abdominal). °· You have a nagging pain in your belly area. °· You continue to feel sick to your stomach (nauseous), throw up (vomit), or have watery poop (diarrhea). °· You have bad smelling fluid coming from your vagina. °· You have pain with peeing (urination). °Get help right away if: °· You have a fever. °· You are leaking fluid from your vagina. °· You are spotting or bleeding from your vagina. °· You have severe belly cramping or pain. °· You lose or gain weight rapidly. °· You have trouble catching your breath and have chest pain. °· You notice sudden or extreme puffiness (swelling) of your face, hands, ankles, feet, or legs. °· You have not felt the baby move in over an hour. °· You have severe headaches that do not go away with medicine. °· You have vision changes. °This information is not intended to replace advice given to you by your health care provider. Make   sure you discuss any questions you have with your health care provider. Document Released: 12/20/2009 Document Revised: 03/02/2016 Document Reviewed: 11/26/2012 Elsevier Interactive Patient Education  2017 [employer]  Braxton Hicks Contractions Contractions of the uterus can occur throughout pregnancy, but they are not always a sign that you are in labor. You may have practice contractions called Braxton Hicks contractions. These false labor contractions are sometimes confused with true labor. What are Braxton Hicks contractions? Braxton Hicks  contractions are tightening movements that occur in the muscles of the uterus before labor. Unlike true labor contractions, these contractions do not result in opening (dilation) and thinning of the cervix. Toward the end of pregnancy (32-34 weeks), Braxton Hicks contractions can happen more often and may become stronger. These contractions are sometimes difficult to tell apart from true labor because they can be very uncomfortable. You should not feel embarrassed if you go to the hospital with false labor. Sometimes, the only way to tell if you are in true labor is for your health care provider to look for changes in the cervix. The health care provider will do a physical exam and may monitor your contractions. If you are not in true labor, the exam should show that your cervix is not dilating and your water has not broken. If there are no prenatal problems or other health problems associated with your pregnancy, it is completely safe for you to be sent home with false labor. You may continue to have Braxton Hicks contractions until you go into true labor. How can I tell the difference between true labor and false labor?  Differences ? False labor ? Contractions last 30-70 seconds.: Contractions are usually shorter and not as strong as true labor contractions. ? Contractions become very regular.: Contractions are usually irregular. ? Discomfort is usually felt in the top of the uterus, and it spreads to the lower abdomen and low back.: Contractions are often felt in the front of the lower abdomen and in the groin. ? Contractions do not go away with walking.: Contractions may go away when you walk around or change positions while lying down. ? Contractions usually become more intense and increase in frequency.: Contractions get weaker and are shorter-lasting as time goes on. ? The cervix dilates and gets thinner.: The cervix usually does not dilate or become thin. Follow these instructions at  home:  Take over-the-counter and prescription medicines only as told by your health care provider.  Keep up with your usual exercises and follow other instructions from your health care provider.  Eat and drink lightly if you think you are going into labor.  If Braxton Hicks contractions are making you uncomfortable: ? Change your position from lying down or resting to walking, or change from walking to resting. ? Sit and rest in a tub of warm water. ? Drink enough fluid to keep your urine clear or pale yellow. Dehydration may cause these contractions. ? Do slow and deep breathing several times an hour.  Keep all follow-up prenatal visits as told by your health care provider. This is important. Contact a health care provider if:  You have a fever.  You have continuous pain in your abdomen. Get help right away if:  Your contractions become stronger, more regular, and closer together.  You have fluid leaking or gushing from your vagina.  You pass blood-tinged mucus (bloody show).  You have bleeding from your vagina.  You have low back pain that you never had before.  You feel   your baby's head pushing down and causing pelvic pressure.  Your baby is not moving inside you as much as it used to. Summary  Contractions that occur before labor are called Braxton Hicks contractions, false labor, or practice contractions.  Braxton Hicks contractions are usually shorter, weaker, farther apart, and less regular than true labor contractions. True labor contractions usually become progressively stronger and regular and they become more frequent.  Manage discomfort from Braxton Hicks contractions by changing position, resting in a warm bath, drinking plenty of water, or practicing deep breathing. This information is not intended to replace advice given to you by your health care provider. Make sure you discuss any questions you have with your health care provider. Document Released:  09/25/2005 Document Revised: 08/14/2016 Document Reviewed: 08/14/2016 Elsevier Interactive Patient Education  2017 [employer]  

## 2016-12-29 NOTE — Progress Notes (Signed)
   PRENATAL VISIT NOTE  Subjective:  Jennifer Leach is a 36 y.o. [phone] at 6646w2d being seen today for ongoing prenatal care.  She is currently monitored for the following issues for this low-risk pregnancy and has MRSA (methicillin resistant staph aureus) culture positive; Supervision of normal pregnancy, antepartum; Abnormal laboratory test; and Back pain affecting pregnancy on her problem list.  Patient reports no bleeding, no leaking, occasional contractions and contractions increased while working, discussed stopping work; patient wants to work until the end of April, dangers of continued work discussed.  Contractions: Not present. Vag. Bleeding: None.  Movement: Present. Denies leaking of fluid.   The following portions of the patient's history were reviewed and updated as appropriate: allergies, current medications, past family history, past medical history, past social history, past surgical history and problem list. Problem list updated.  Objective:   Vitals:   12/29/16 0936  BP: 105/71  Pulse: 84  Temp: 97.1 F (36.2 C)  Weight: 169 lb 12.8 oz (77 kg)    Fetal Status: Fetal Heart Rate (bpm): 145 Fundal Height: 30 cm Movement: Present  Presentation: Vertex  General:  Alert, oriented and cooperative. Patient is in no acute distress.  Skin: Skin is warm and dry. No rash noted.   Cardiovascular: Normal heart rate noted  Respiratory: Normal respiratory effort, no problems with respiration noted  Abdomen: Soft, gravid, appropriate for gestational age. Pain/Pressure: Present     Pelvic:  Cervical exam performed Dilation: Fingertip Effacement (%): 0 Station: -3; outer os 1 cm, inner os closed, soft  Extremities: Normal range of motion.  Edema: None  Mental Status: Normal mood and affect. Normal behavior. Normal judgment and thought content.   Assessment and Plan:  Pregnancy: G4P3003 at 3446w2d  1. Supervision of other normal pregnancy, antepartum     Braxton Hicks contractions:    - Fetal fibronectin  2. Yeast infection involving the vagina and surrounding area     - fluconazole (DIFLUCAN) 150 MG tablet; Take 1 tablet (150 mg total) by mouth once. Repeat dose in 48-72 hours  Dispense: 2 tablet; Refill: 0 - Cervicovaginal ancillary only  Preterm labor symptoms and general obstetric precautions including but not limited to vaginal bleeding, contractions, leaking of fluid and fetal movement were reviewed in detail with the patient. Please refer to After Visit Summary for other counseling recommendations.  Return in about 2 weeks (around 01/12/2017) for ROB.   Roe Coombsachelle A Manuel Lawhead, CNM

## 2016-12-30 LAB — FETAL FIBRONECTIN: Fetal Fibronectin: NEGATIVE

## 2017-01-01 ENCOUNTER — Telehealth: Payer: Self-pay | Admitting: *Deleted

## 2017-01-01 NOTE — Telephone Encounter (Signed)
Patient is having extreme back and leg pain at work. She is wearing her back brace and it is not helping. Appointment for evaluation made.

## 2017-01-01 NOTE — Telephone Encounter (Signed)
Patient requested call back LM on VM to CB

## 2017-01-02 ENCOUNTER — Encounter: Payer: Self-pay | Admitting: *Deleted

## 2017-01-02 ENCOUNTER — Encounter: Payer: Self-pay | Admitting: Obstetrics

## 2017-01-02 ENCOUNTER — Ambulatory Visit (INDEPENDENT_AMBULATORY_CARE_PROVIDER_SITE_OTHER): Payer: Medicaid Other | Admitting: Obstetrics

## 2017-01-02 VITALS — BP 103/65 | HR 83 | Wt 170.0 lb

## 2017-01-02 DIAGNOSIS — M545 Low back pain, unspecified: Secondary | ICD-10-CM

## 2017-01-02 DIAGNOSIS — O4700 False labor before 37 completed weeks of gestation, unspecified trimester: Secondary | ICD-10-CM

## 2017-01-02 DIAGNOSIS — O26893 Other specified pregnancy related conditions, third trimester: Secondary | ICD-10-CM

## 2017-01-02 DIAGNOSIS — O4703 False labor before 37 completed weeks of gestation, third trimester: Secondary | ICD-10-CM

## 2017-01-02 DIAGNOSIS — Z3480 Encounter for supervision of other normal pregnancy, unspecified trimester: Secondary | ICD-10-CM

## 2017-01-02 LAB — CERVICOVAGINAL ANCILLARY ONLY
BACTERIAL VAGINITIS: NEGATIVE
CANDIDA VAGINITIS: NEGATIVE
Chlamydia: NEGATIVE
Neisseria Gonorrhea: NEGATIVE
Trichomonas: NEGATIVE

## 2017-01-02 LAB — POCT URINALYSIS DIPSTICK
Bilirubin, UA: NEGATIVE
Blood, UA: NEGATIVE
GLUCOSE UA: NEGATIVE
KETONES UA: NEGATIVE
LEUKOCYTES UA: NEGATIVE
Nitrite, UA: NEGATIVE
Protein, UA: NEGATIVE
Spec Grav, UA: 1.015 (ref 1.030–1.035)
Urobilinogen, UA: 0.2 (ref ?–2.0)
pH, UA: 6.5 (ref 5.0–8.0)

## 2017-01-02 MED ORDER — OXYCODONE-ACETAMINOPHEN 10-325 MG PO TABS: 1.0000 | ORAL_TABLET | Freq: Three times a day (TID) | ORAL | 0 refills | Status: DC | PRN

## 2017-01-02 NOTE — Progress Notes (Signed)
Subjective:  Jennifer Leach is a 36 y.o. [phone] at 148w6d being seen today for ongoing prenatal care.  She is currently monitored for the following issues for this low-risk pregnancy and has MRSA (methicillin resistant staph aureus) culture positive; Supervision of normal pregnancy, antepartum; Abnormal laboratory test; and Back pain affecting pregnancy on her problem list.  Patient reports backache and occasional contractions.  Contractions: Irregular. Vag. Bleeding: None.  Movement: Present. Denies leaking of fluid.   The following portions of the patient's history were reviewed and updated as appropriate: allergies, current medications, past family history, past medical history, past social history, past surgical history and problem list. Problem list updated.  Objective:   Vitals:   01/02/17 0814  BP: 103/65  Pulse: 83  Weight: 170 lb (77.1 kg)    Fetal Status: Fetal Heart Rate (bpm): 140   Movement: Present     General:  Alert, oriented and cooperative. Patient is in no acute distress.  Skin: Skin is warm and dry. No rash noted.   Cardiovascular: Normal heart rate noted  Respiratory: Normal respiratory effort, no problems with respiration noted  Abdomen: Soft, gravid, appropriate for gestational age. Pain/Pressure: Present     Pelvic:  Cervical exam performed        Extremities: Normal range of motion.  Edema: None  Mental Status: Normal mood and affect. Normal behavior. Normal judgment and thought content.   Urinalysis:      Assessment and Plan:  Pregnancy: G4P3003 at 8848w6d  1. Low back pain during pregnancy in third trimester Rx: - POCT urinalysis dipstick - oxyCODONE-acetaminophen (PERCOCET) 10-325 MG tablet; Take 1 tablet by mouth every 8 (eight) hours as needed for pain.  Dispense: 20 tablet; Refill: 0 - Letter written to employer for patient to start Maternity Leave because of pregnancy complications of threatened PTL  2. Supervision of other normal pregnancy,  antepartum   Preterm labor symptoms and general obstetric precautions including but not limited to vaginal bleeding, contractions, leaking of fluid and fetal movement were reviewed in detail with the patient. Please refer to After Visit Summary for other counseling recommendations.  Return in 2 weeks (on 01/16/2017).   Brock Badharles A Marleny Faller, MDPatient ID: Jennifer Leach, female   DOB: 06/29/1981, 36 y.o.   MRN: [id number] Patient ID: Jennifer Leach, female   DOB: 06/29/1981, 36 y.o.   MRN: [id number]

## 2017-01-02 NOTE — Progress Notes (Signed)
Pt presents for ROB problem visit. C/o R side low back pain x 1 month. Has rx for Flexeril and back brace does not work. Seen at [facility] on Friday, FFN neg, GC/CT still pending.

## 2017-01-12 ENCOUNTER — Encounter: Payer: Self-pay | Admitting: Certified Nurse Midwife

## 2017-01-12 ENCOUNTER — Other Ambulatory Visit (HOSPITAL_COMMUNITY)
Admission: RE | Admit: 2017-01-12 | Discharge: 2017-01-12 | Disposition: A | Discharge status: Home / Self Care | Payer: Medicaid Other | Source: Ambulatory Visit | Attending: Certified Nurse Midwife | Admitting: Certified Nurse Midwife

## 2017-01-12 ENCOUNTER — Ambulatory Visit (INDEPENDENT_AMBULATORY_CARE_PROVIDER_SITE_OTHER): Payer: Medicaid Other | Admitting: Certified Nurse Midwife

## 2017-01-12 VITALS — BP 106/71 | HR 92 | Wt 172.2 lb

## 2017-01-12 DIAGNOSIS — B373 Candidiasis of vulva and vagina: Secondary | ICD-10-CM

## 2017-01-12 DIAGNOSIS — O3433 Maternal care for cervical incompetence, third trimester: Secondary | ICD-10-CM

## 2017-01-12 DIAGNOSIS — B3731 Acute candidiasis of vulva and vagina: Secondary | ICD-10-CM

## 2017-01-12 DIAGNOSIS — Z3480 Encounter for supervision of other normal pregnancy, unspecified trimester: Secondary | ICD-10-CM

## 2017-01-12 MED ORDER — FLUCONAZOLE 150 MG PO TABS
150.0000 mg | ORAL_TABLET | Freq: Once | ORAL | 0 refills | Status: AC
Start: 2017-01-12 — End: 2017-01-12

## 2017-01-12 NOTE — Addendum Note (Signed)
Addended by: Elby Beck F on: 01/12/2017 10:19 AM   Modules accepted: Orders

## 2017-01-12 NOTE — Progress Notes (Signed)
Possible yeast infection    

## 2017-01-12 NOTE — Progress Notes (Signed)
   PRENATAL VISIT NOTE  Subjective:  Jennifer Leach is a 36 y.o. G4P3003 at [redacted]w[redacted]d being seen today for ongoing prenatal care.  She is currently monitored for the following issues for this low-risk pregnancy and has Supervision of normal pregnancy, antepartum; Abnormal laboratory test; Back pain affecting pregnancy; and Premature cervical dilation in third trimester on her problem list.  Patient reports backache, no bleeding, no leaking, occasional contractions and states has contractions in her back occasionally\.  Contractions: Irregular. Vag. Bleeding: None.  Movement: Present. Denies leaking of fluid.   The following portions of the patient's history were reviewed and updated as appropriate: allergies, current medications, past family history, past medical history, past social history, past surgical history and problem list. Problem list updated.  Objective:   Vitals:   01/12/17 0930  BP: 106/71  Pulse: 92  Weight: 172 lb 3.2 oz (78.1 kg)    Fetal Status: Fetal Heart Rate (bpm): 151 Fundal Height: 32 cm Movement: Present  Presentation: Vertex  General:  Alert, oriented and cooperative. Patient is in no acute distress.  Skin: Skin is warm and dry. No rash noted.   Cardiovascular: Normal heart rate noted  Respiratory: Normal respiratory effort, no problems with respiration noted  Abdomen: Soft, gravid, appropriate for gestational age. Pain/Pressure: Present     Pelvic:  Cervical exam performed Dilation: 1 Effacement (%): 20 Station: -3  Extremities: Normal range of motion.  Edema: None  Mental Status: Normal mood and affect. Normal behavior. Normal judgment and thought content.   Assessment and Plan:  Pregnancy: G4P3003 at [redacted]w[redacted]d  1. Supervision of other normal pregnancy, antepartum       2. Yeast vaginitis      - fluconazole (DIFLUCAN) 150 MG tablet; Take 1 tablet (150 mg total) by mouth once.  Dispense: 1 tablet; Refill: 0  3. Premature cervical dilation in third  trimester      1 cm today, change from 1 month ago of closed. Discussed with Dr. Clearance Coots, no BMZ indicated at this time.  Is not taking her procardia. Not currently working.   Has maternity support belt. Strict precautions given.    Preterm labor symptoms and general obstetric precautions including but not limited to vaginal bleeding, contractions, leaking of fluid and fetal movement were reviewed in detail with the patient. Please refer to After Visit Summary for other counseling recommendations.  Return in about 2 weeks (around 01/26/2017) for ROB.   Roe Coombs, CNM

## 2017-01-15 ENCOUNTER — Telehealth: Payer: Self-pay | Admitting: *Deleted

## 2017-01-15 ENCOUNTER — Inpatient Hospital Stay (HOSPITAL_COMMUNITY)
Admission: AD | Admit: 2017-01-15 | Discharge: 2017-01-15 | Disposition: A | Discharge status: Home / Self Care | Payer: Medicaid Other | Source: Ambulatory Visit | Attending: Obstetrics & Gynecology | Admitting: Obstetrics & Gynecology

## 2017-01-15 ENCOUNTER — Encounter (HOSPITAL_COMMUNITY): Payer: Self-pay | Admitting: *Deleted

## 2017-01-15 DIAGNOSIS — Z3A32 32 weeks gestation of pregnancy: Secondary | ICD-10-CM | POA: Diagnosis not present

## 2017-01-15 DIAGNOSIS — Z882 Allergy status to sulfonamides status: Secondary | ICD-10-CM | POA: Diagnosis not present

## 2017-01-15 DIAGNOSIS — R8789 Other abnormal findings in specimens from female genital organs: Secondary | ICD-10-CM

## 2017-01-15 DIAGNOSIS — R109 Unspecified abdominal pain: Secondary | ICD-10-CM | POA: Diagnosis present

## 2017-01-15 DIAGNOSIS — Z87891 Personal history of nicotine dependence: Secondary | ICD-10-CM | POA: Diagnosis not present

## 2017-01-15 DIAGNOSIS — Z8249 Family history of ischemic heart disease and other diseases of the circulatory system: Secondary | ICD-10-CM | POA: Insufficient documentation

## 2017-01-15 DIAGNOSIS — O4703 False labor before 37 completed weeks of gestation, third trimester: Secondary | ICD-10-CM

## 2017-01-15 DIAGNOSIS — O09899 Supervision of other high risk pregnancies, unspecified trimester: Secondary | ICD-10-CM

## 2017-01-15 LAB — URINALYSIS, ROUTINE W REFLEX MICROSCOPIC
Bilirubin Urine: NEGATIVE
Glucose, UA: NEGATIVE mg/dL
Hgb urine dipstick: NEGATIVE
Ketones, ur: NEGATIVE mg/dL
LEUKOCYTES UA: NEGATIVE
Nitrite: NEGATIVE
Protein, ur: NEGATIVE mg/dL
SPECIFIC GRAVITY, URINE: 1.005 (ref 1.005–1.030)
pH: 7 (ref 5.0–8.0)

## 2017-01-15 LAB — CERVICOVAGINAL ANCILLARY ONLY
Bacterial vaginitis: NEGATIVE
Candida vaginitis: NEGATIVE
Chlamydia: NEGATIVE
Neisseria Gonorrhea: NEGATIVE
Trichomonas: NEGATIVE

## 2017-01-15 LAB — GLUCOSE, CAPILLARY: GLUCOSE-CAPILLARY: 78 mg/dL (ref 65–99)

## 2017-01-15 LAB — FETAL FIBRONECTIN: FETAL FIBRONECTIN: POSITIVE — AB

## 2017-01-15 MED ORDER — NIFEDIPINE 10 MG PO CAPS
10.0000 mg | ORAL_CAPSULE | Freq: Once | ORAL | Status: AC
  Administered 2017-01-15: 10 mg via ORAL
  Filled 2017-01-15: qty 1

## 2017-01-15 MED ORDER — BETAMETHASONE SOD PHOS & ACET 6 (3-3) MG/ML IJ SUSP
12.0000 mg | Freq: Once | INTRAMUSCULAR | Status: AC
  Administered 2017-01-15: 12 mg via INTRAMUSCULAR
  Filled 2017-01-15: qty 2

## 2017-01-15 NOTE — [facility] Provider Note (Signed)
History     CSN: [id number]  Arrival date and time: 01/15/17 4098   First Provider Initiated Contact with Patient 01/15/17 [phone]      Chief Complaint  Patient presents with  . Abdominal Pain   HPI   [name] is a 36 y.o. female [phone] @ [redacted]w[redacted]d here in [facility] with vaginal pressure that started on Friday. The pain is similar to contractions. She had her cervix checked in the office on Friday and was told the head was right there and that her cervix was 1 cm. No recent intercourse. Denies vaginal bleeding. + fetal movement. Patient has RX for procardia however has not taken it.   OB History    Gravida Para Term Preterm AB Living   0 3   SAB TAB Ectopic Multiple Live Births     0     3      Past Medical History:  Diagnosis Date  . Gonorrhea   . MRSA (methicillin resistant staph aureus) culture positive 08/06/2014    History reviewed. No pertinent surgical history.  Family History  Problem Relation Age of Onset  . Hypertension Mother     Social History  Substance Use Topics  . Smoking status: Former Smoker    Packs/day: 0.25    Years: 9.00    Types: Cigarettes    Quit date: 06/09/2012  . Smokeless tobacco: Former Neurosurgeon  . Alcohol use No    Allergies:  Allergies  Allergen Reactions  . Septra Ds [Sulfamethoxazole-Trimethoprim] Anaphylaxis, Swelling, Rash and Other (See Comments)    Reaction:  Facial swelling     Prescriptions Prior to Admission  Medication Sig Dispense Refill Last Dose  . acetaminophen (TYLENOL) 325 MG tablet Take 325 mg by mouth every 6 (six) hours as needed for moderate pain.   Past Month at Unknown time  . cyclobenzaprine (FLEXERIL) 10 MG tablet Take 1 tablet (10 mg total) by mouth every 8 (eight) hours as needed for muscle spasms. 30 tablet 1 Past Week at Unknown time  . NIFEdipine (PROCARDIA) 10 MG capsule Take 1 capsule (10 mg total) by mouth every 6 (six) hours as needed. 120 capsule 2 Past Month at Unknown time  . omeprazole  (PRILOSEC) 20 MG capsule Take 1 capsule (20 mg total) by mouth 2 (two) times daily before a meal. 60 capsule 5 01/14/2017 at Unknown time  . oxyCODONE-acetaminophen (PERCOCET) 10-325 MG tablet Take 1 tablet by mouth every 8 (eight) hours as needed for pain. 20 tablet 0 Past Week at Unknown time   Results for orders placed or performed during the hospital encounter of 01/15/17 (from the past 48 hour(s))  Urinalysis, Routine w reflex microscopic     Status: None   Collection Time: 01/15/17  9:10 AM  Result Value Ref Range   Color, Urine YELLOW YELLOW   APPearance CLEAR CLEAR   Specific Gravity, Urine 1.005 1.005 - 1.030   pH 7.0 5.0 - 8.0   Glucose, UA NEGATIVE NEGATIVE mg/dL   Hgb urine dipstick NEGATIVE NEGATIVE   Bilirubin Urine NEGATIVE NEGATIVE   Ketones, ur NEGATIVE NEGATIVE mg/dL   Protein, ur NEGATIVE NEGATIVE mg/dL   Nitrite NEGATIVE NEGATIVE   Leukocytes, UA NEGATIVE NEGATIVE  Fetal fibronectin     Status: Abnormal   Collection Time: 01/15/17  9:59 AM  Result Value Ref Range   Fetal Fibronectin POSITIVE (A) NEGATIVE  Glucose, capillary     Status: None   Collection Time: 01/15/17 10:51  AM  Result Value Ref Range   Glucose-Capillary 78 65 - 99 mg/dL    Review of Systems  Constitutional: Negative for fever.  Gastrointestinal: Positive for abdominal pain.  Genitourinary: Positive for pelvic pain. Negative for vaginal bleeding.   Physical Exam   Blood pressure 115/72, pulse 96, temperature 97.5 F (36.4 C), temperature source Oral, resp. rate 16, height  (1.702 m), weight 173 lb (78.5 kg), last menstrual period 05/26/2016.  Physical Exam  Constitutional: She is oriented to person, place, and time. She appears well-developed and well-nourished.  Non-toxic appearance. She does not have a sickly appearance. She does not appear ill. No distress.  HENT:  Head: Normocephalic.  Eyes: Pupils are equal, round, and reactive to light.  Musculoskeletal: Normal range of motion.   Neurological: She is alert and oriented to person, place, and time.  Skin: Skin is warm. She is not diaphoretic.  Psychiatric: Her behavior is normal.   Fetal Tracing: Baseline: 135 bpm  Variability: Moderate  Accelerations: 15x15 Decelerations: None Toco: UI, and one contraction.   [facility]U Course  Procedures  None  MDM  Fetal fibronectin positive Betamethasone given Procardia 1 dose given 10 mg: patient had episode of heart racing to 140's, brief SOB, pulse ox 100 % on RA. Symptoms resolved 30 minutes later.  BS 78. Patient given crackers and peanut butter and water.  Discussed with Dr. Debroah Loop.   Assessment and Plan   A:  1. Threatened premature labor in third trimester   2. Positive fetal fibronectin at 22 weeks to [redacted] weeks gestation     P:  Discharge home in stable condition Patient instructed to go to the [facility] tomorrow @ 1100 for 2nd dose of betamethasone. Information added to the yellow folder.  Return to [facility]U if symptoms worsen Preterm labor precautions.    Duane Lope, NP 01/15/2017 1:58 PM

## 2017-01-15 NOTE — Discharge Instructions (Signed)
Fetal Fibronectin Fetal fibronectin (fFN) is a protein that your body produces during pregnancy. This protein is normally found in your vaginal fluid in early pregnancy and just before delivery. It should not be there between 22 and 35 weeks of pregnancy. Having fFN in your vagina between 22 and 35 weeks could be a warning sign that your baby will be born early (prematurely). Babies born prematurely, or before 37 weeks, may have trouble breathing or feeding. A negative fFN test between 22 and 35 weeks means that it is unlikely you will have a premature delivery in the next 2 weeks. You may have this test if you have symptoms of premature labor. These include:  Contractions.  Increased vaginal discharge.  Backache. If there is a chance of preterm labor and delivery, your health care provider will monitor you carefully and take steps to delay your labor if necessary. This test requires a sample of fluid from inside your vagina. Your health care provider collects this sample using a cotton swab. How do I prepare for this test?  Ask your health care provider if:  You need to avoid using lubricants or douches before this exam.  You need to avoid sexual intercourse for 24 hours before the exam.  Tell your health care provider if you have a vaginal yeast infection or any symptoms of a yeast infection:  Itching.  Soreness.  Discharge. What do the results mean? It is your responsibility to obtain your test results. Ask the lab or department performing the test when and how you will get your results. Contact your health care provider to discuss any questions you have about your results. The results of this test will be positive or negative. Meaning of Negative Test Results  A negative result means no fFN was found in your vaginal fluid. A negative result means that there is very little chance you will go into labor in the next two weeks. You may have this test again in two weeks if you are still  having symptoms of early labor. Meaning of Positive Test Results  A positive result means fFN was found in your vaginal fluid. A positive result does not mean you will go into early labor. It does mean your risk is greater. Your health care provider may do other tests and exams to closely follow your pregnancy. Talk with your health care provider to discuss your results, treatment options, and if necessary, the need for more tests. Talk with your health care provider if you have any questions about your results. This information is not intended to replace advice given to you by your health care provider. Make sure you discuss any questions you have with your health care provider. Document Released: 07/27/2004 Document Revised: 05/30/2016 Document Reviewed: 12/23/2013 Elsevier Interactive Patient Education  2017 Else[employer] Inc[employer]   Pelvic Rest Pelvic rest may be recommended if:  Your placenta is partially or completely covering the opening of your cervix (placenta previa).  There is bleeding between the wall of the uterus and the amniotic sac in the first trimester of pregnancy (subchorionic hemorrhage).  You went into labor too early (preterm labor). Based on your overall health and the health of your baby, your health care provider will decide if pelvic rest is right for you. How do I rest my pelvis? For as long as told by your health care provider:  Do not have sex, sexual stimulation, or an orgasm.  Do not use tampons. Do not douche. Do not put anything in  your vagina. °· Do not lift anything that is heavier than 10 lb (4.5 kg). °· Avoid activities that take a lot of effort (are strenuous). °· Avoid any activity in which your pelvic muscles could become strained. °When should I seek medical care? °Seek medical care if you have: °· Cramping pain in your lower abdomen. °· Vaginal discharge. °· A low, dull backache. °· Regular contractions. °· Uterine tightening. °When should I seek immediate  medical care? °Seek immediate medical care if: °· You have vaginal bleeding and you are pregnant. °This information is not intended to replace advice given to you by your health care provider. Make sure you discuss any questions you have with your health care provider. °Document Released: 01/20/2011 Document Revised: 03/02/2016 Document Reviewed: 03/29/2015 °Elsevier Interactive Patient Education © 2017 Elsevier Inc. ° °

## 2017-01-15 NOTE — Progress Notes (Signed)
Monitors replaced after patient up to bathroom.  Pt reports feeling SOB, heart racing, and "legs feel numb."  VS obtained. HR 140s.  Venia Carbon, NP aware and going to see patient.

## 2017-01-15 NOTE — Telephone Encounter (Signed)
Pt called to office, left no message.  In review of chart for phone call, pt is currently at MAU.   No call will be placed.

## 2017-01-15 NOTE — MAU Note (Addendum)
Pt reports intermittent vaginal pressure and abdominal pain. Pt was seen in the office on Friday and was 1 cm dilated. Pt was having this pain on Friday, but now the pain is getting progressively worse. Yesterday Pt reports that she had urinary frequency.  Her Doctor told her to come to the hospital if the pain increased. Pt denies vaginal bleeding or leaking. + FM

## 2017-01-16 ENCOUNTER — Ambulatory Visit (INDEPENDENT_AMBULATORY_CARE_PROVIDER_SITE_OTHER): Payer: Medicaid Other

## 2017-01-16 VITALS — BP 103/71 | HR 72

## 2017-01-16 DIAGNOSIS — O4703 False labor before 37 completed weeks of gestation, third trimester: Secondary | ICD-10-CM | POA: Diagnosis present

## 2017-01-16 DIAGNOSIS — Z3A33 33 weeks gestation of pregnancy: Secondary | ICD-10-CM | POA: Diagnosis not present

## 2017-01-16 DIAGNOSIS — O4700 False labor before 37 completed weeks of gestation, unspecified trimester: Secondary | ICD-10-CM

## 2017-01-16 MED ORDER — BETAMETHASONE SOD PHOS & ACET 6 (3-3) MG/ML IJ SUSP: 12.0000 mg | Freq: Once | INTRAMUSCULAR | Status: DC

## 2017-01-16 NOTE — Progress Notes (Signed)
Patient presented to office today for her second betamethasone injection 12 mg. Patient tolerated well. Vitals have been obtained. Patient will follow up at her next visit.

## 2017-01-18 ENCOUNTER — Other Ambulatory Visit: Payer: Self-pay | Admitting: Certified Nurse Midwife

## 2017-01-18 ENCOUNTER — Inpatient Hospital Stay (EMERGENCY_DEPARTMENT_HOSPITAL)
Admission: AD | Admit: 2017-01-18 | Discharge: 2017-01-19 | Disposition: A | Discharge status: Home / Self Care | Payer: Medicaid Other | Source: Ambulatory Visit | Attending: Obstetrics & Gynecology | Admitting: Obstetrics & Gynecology

## 2017-01-18 ENCOUNTER — Encounter (HOSPITAL_COMMUNITY): Payer: Self-pay | Admitting: *Deleted

## 2017-01-18 ENCOUNTER — Inpatient Hospital Stay (HOSPITAL_COMMUNITY)
Admission: AD | Admit: 2017-01-18 | Discharge: 2017-01-18 | Disposition: A | Discharge status: Home / Self Care | Payer: Medicaid Other | Source: Ambulatory Visit | Attending: Obstetrics & Gynecology | Admitting: Obstetrics & Gynecology

## 2017-01-18 DIAGNOSIS — Z3A33 33 weeks gestation of pregnancy: Secondary | ICD-10-CM | POA: Diagnosis not present

## 2017-01-18 DIAGNOSIS — Z882 Allergy status to sulfonamides status: Secondary | ICD-10-CM | POA: Diagnosis not present

## 2017-01-18 DIAGNOSIS — Z87891 Personal history of nicotine dependence: Secondary | ICD-10-CM | POA: Insufficient documentation

## 2017-01-18 DIAGNOSIS — R109 Unspecified abdominal pain: Secondary | ICD-10-CM | POA: Diagnosis present

## 2017-01-18 DIAGNOSIS — O4703 False labor before 37 completed weeks of gestation, third trimester: Secondary | ICD-10-CM | POA: Insufficient documentation

## 2017-01-18 DIAGNOSIS — Z8249 Family history of ischemic heart disease and other diseases of the circulatory system: Secondary | ICD-10-CM | POA: Insufficient documentation

## 2017-01-18 DIAGNOSIS — S3991XA Unspecified injury of abdomen, initial encounter: Secondary | ICD-10-CM

## 2017-01-18 DIAGNOSIS — R1030 Lower abdominal pain, unspecified: Secondary | ICD-10-CM | POA: Diagnosis not present

## 2017-01-18 DIAGNOSIS — O9989 Other specified diseases and conditions complicating pregnancy, childbirth and the puerperium: Secondary | ICD-10-CM | POA: Diagnosis not present

## 2017-01-18 LAB — URINALYSIS, ROUTINE W REFLEX MICROSCOPIC
Bilirubin Urine: NEGATIVE
Glucose, UA: NEGATIVE mg/dL
Hgb urine dipstick: NEGATIVE
KETONES UR: NEGATIVE mg/dL
Leukocytes, UA: NEGATIVE
NITRITE: NEGATIVE
PROTEIN: NEGATIVE mg/dL
Specific Gravity, Urine: 1.004 — ABNORMAL LOW (ref 1.005–1.030)
pH: 8 (ref 5.0–8.0)

## 2017-01-18 MED ORDER — CYCLOBENZAPRINE HCL 10 MG PO TABS
10.0000 mg | ORAL_TABLET | Freq: Once | ORAL | Status: AC
  Administered 2017-01-19: 10 mg via ORAL
  Filled 2017-01-18: qty 1

## 2017-01-18 MED ORDER — ACETAMINOPHEN 325 MG PO TABS
650.0000 mg | ORAL_TABLET | Freq: Once | ORAL | Status: AC
  Administered 2017-01-19: 650 mg via ORAL
  Filled 2017-01-18: qty 2

## 2017-01-18 NOTE — Progress Notes (Signed)
First Provider Initiated Contact with Patient 01/18/17 2346      Chief Complaint:  Pulled muscle  Jennifer Leach is  36 y.o. G4P3003 at [redacted]w[redacted]d presents complaining of lower abdominal and symphysis pubis pain. She was in MAU for several hours for vaginal pressure and mild/infrequent contractions.  She was discharged less than an hour ago.  When she got home, she picked up a log in her yard.When she picked it up, she felt as if she pulled something in her groin/lower abdomen and fell to her knees w/the pain.  Now it hurts to walk, esp in her symphysis pubis.    Obstetrical/Gynecological History: OB History    Gravida Para Term Preterm AB Living   0 3   SAB TAB Ectopic Multiple Live Births     0     3     Past Medical History: Past Medical History:  Diagnosis Date  . Gonorrhea   . MRSA (methicillin resistant staph aureus) culture positive 08/06/2014    Past Surgical History: Past Surgical History:  Procedure Laterality Date  . NO PAST SURGERIES      Family History: Family History  Problem Relation Age of Onset  . Hypertension Mother     Social History: Social History  Substance Use Topics  . Smoking status: Former Smoker    Packs/day: 0.25    Years: 9.00    Types: Cigarettes    Quit date: 06/09/2012  . Smokeless tobacco: Former Neurosurgeon  . Alcohol use No    Allergies:  Allergies  Allergen Reactions  . Septra Ds [Sulfamethoxazole-Trimethoprim] Anaphylaxis, Swelling, Rash and Other (See Comments)    Reaction:  Facial swelling     Meds:  Facility-Administered Medications Prior to Admission  Medication Dose Route Frequency Provider Last Rate Last Dose  . betamethasone acetate-betamethasone sodium phosphate (CELESTONE) injection 12 mg  12 mg Intramuscular Once [place], CNM       Prescriptions Prior to Admission  Medication Sig Dispense Refill Last Dose  . acetaminophen (TYLENOL) 325 MG tablet Take 325 mg by mouth every 6 (six) hours as needed for  moderate pain.   Past Month at Unknown time  . NIFEdipine (PROCARDIA) 10 MG capsule Take 1 capsule (10 mg total) by mouth every 6 (six) hours as needed. (Patient not taking: Reported on 01/16/2017) 120 capsule 2 Not Taking at Unknown time  . omeprazole (PRILOSEC) 20 MG capsule Take 1 capsule (20 mg total) by mouth 2 (two) times daily before a meal. (Patient not taking: Reported on 01/16/2017) 60 capsule 5 Not Taking at Unknown time    Review of Systems   Constitutional: Negative for fever and chills Eyes: Negative for visual disturbances Respiratory: Negative for shortness of breath, dyspnea Cardiovascular: Negative for chest pain or palpitations  Gastrointestinal: Negative for vomiting, diarrhea and constipation Genitourinary: Negative for dysuria and urgency Musculoskeletal: Negative for back pain.  POSITIVE FOR GROIN/PUBIC BONE PAIN Neurological: Negative for dizziness and headaches    Physical Exam  Blood pressure 107/61, pulse 98, temperature 98.1 F (36.7 C), temperature source Oral, resp. rate 20, height  (1.702 m), weight 78.4 kg (172 lb 12 oz), last menstrual period 05/26/2016. GENERAL: Well-developed, well-nourished female in no acute distress.  LUNGS: Clear to auscultation bilaterally.  HEART: Regular rate and rhythm. ABDOMEN: Soft, nontender, nondistended, gravid.  EXTREMITIES: Nontender, no edema, 2+ distal pulses. DTR's 2+ CERVICAL EXAM: Dilatation 1cm   Effacement thick%   Station -3  (no change) Presentation:  cephalic FHT:  Baseline rate 150 bpm   Variability moderate  Accelerations present   Decelerations none Contractions: rare   Labs: Results for orders placed or performed during the hospital encounter of 01/18/17 (from the past 24 hour(s))  Urinalysis, Routine w reflex microscopic   Collection Time: 01/18/17  6:33 PM  Result Value Ref Range   Color, Urine STRAW (A) YELLOW   APPearance CLEAR CLEAR   Specific Gravity, Urine 1.004 (L) 1.005 - 1.030   pH 8.0  5.0 - 8.0   Glucose, UA NEGATIVE NEGATIVE mg/dL   Hgb urine dipstick NEGATIVE NEGATIVE   Bilirubin Urine NEGATIVE NEGATIVE   Ketones, ur NEGATIVE NEGATIVE mg/dL   Protein, ur NEGATIVE NEGATIVE mg/dL   Nitrite NEGATIVE NEGATIVE   Leukocytes, UA NEGATIVE NEGATIVE   Imaging Studies:  No results found.  Assessment: Jennifer Leach is  36 y.o. [id number] at [redacted]w[redacted]d presents with groin pain s/p picking up a log.  Plan: Tylenol, flexeril, ice  CRESENZO-DISHMAN,Shauntay Brunelli 4/12/201811:55 PM

## 2017-01-18 NOTE — Discharge Instructions (Signed)

## 2017-01-18 NOTE — MAU Provider Note (Signed)
History     CSN: [id number]  Arrival date and time: 01/18/17 1818    First Provider Initiated Contact with Patient 01/18/17 1933        Chief Complaint  Patient presents with  . Contractions   HPI  Jennifer Leach is a 36 y.o. G4P3003 at [redacted]w[redacted]d who presents with contractions & vaginal pressure. Symptoms began this morning. Reports contractions "all the time". Unable to tell how frequent nor how many times per hour. Rates pain 8/10 when occurs. Denies vaginal bleeding, LOF, or abdominal trauma. Positive fetal movement although not as "hard" as normal.  OB History    Gravida Para Term Preterm AB Living   0 3   SAB TAB Ectopic Multiple Live Births     0     3      Past Medical History:  Diagnosis Date  . Gonorrhea   . MRSA (methicillin resistant staph aureus) culture positive 08/06/2014    Past Surgical History:  Procedure Laterality Date  . NO PAST SURGERIES      Family History  Problem Relation Age of Onset  . Hypertension Mother     Social History  Substance Use Topics  . Smoking status: Former Smoker    Packs/day: 0.25    Years: 9.00    Types: Cigarettes    Quit date: 06/09/2012  . Smokeless tobacco: Former Neurosurgeon  . Alcohol use No    Allergies:  Allergies  Allergen Reactions  . Septra Ds [Sulfamethoxazole-Trimethoprim] Anaphylaxis, Swelling, Rash and Other (See Comments)    Reaction:  Facial swelling     Facility-Administered Medications Prior to Admission  Medication Dose Route Frequency Provider Last Rate Last Dose  . betamethasone acetate-betamethasone sodium phosphate (CELESTONE) injection 12 mg  12 mg Intramuscular Once [place], CNM       Prescriptions Prior to Admission  Medication Sig Dispense Refill Last Dose  . acetaminophen (TYLENOL) 325 MG tablet Take 325 mg by mouth every 6 (six) hours as needed for moderate pain.   Not Taking  . cyclobenzaprine (FLEXERIL) 10 MG tablet Take 1 tablet (10 mg total) by mouth every 8 (eight) hours  as needed for muscle spasms. (Patient not taking: Reported on 01/16/2017) 30 tablet 1 Not Taking  . NIFEdipine (PROCARDIA) 10 MG capsule Take 1 capsule (10 mg total) by mouth every 6 (six) hours as needed. (Patient not taking: Reported on 01/16/2017) 120 capsule 2 Not Taking  . omeprazole (PRILOSEC) 20 MG capsule Take 1 capsule (20 mg total) by mouth 2 (two) times daily before a meal. (Patient not taking: Reported on 01/16/2017) 60 capsule 5 Not Taking  . oxyCODONE-acetaminophen (PERCOCET) 10-325 MG tablet Take 1 tablet by mouth every 8 (eight) hours as needed for pain. (Patient not taking: Reported on 01/16/2017) 20 tablet 0 Not Taking    Review of Systems  Constitutional: Negative.   Gastrointestinal: Positive for abdominal pain. Negative for constipation, diarrhea, nausea and vomiting.  Genitourinary: Negative.    Physical Exam   Blood pressure 105/64, pulse 86, temperature 98.1 F (36.7 C), temperature source Oral, resp. rate 18, height  (1.676 m), weight 173 lb (78.5 kg), last menstrual period 05/26/2016.  Physical Exam  Nursing note and vitals reviewed. Constitutional: She is oriented to person, place, and time. She appears well-developed and well-nourished. No distress.  HENT:  Head: Normocephalic and atraumatic.  Eyes: Conjunctivae are normal. Right eye exhibits no discharge. Left eye exhibits no discharge. No scleral icterus.  Neck: Normal range of motion.  Cardiovascular: Normal rate, regular rhythm and normal heart sounds.   No murmur heard. Respiratory: Effort normal and breath sounds normal. No respiratory distress. She has no wheezes.  GI: Soft.  Moderate ctx palpated  Neurological: She is alert and oriented to person, place, and time.  Skin: Skin is warm and dry. She is not diaphoretic.  Psychiatric: She has a normal mood and affect. Her behavior is normal. Judgment and thought content normal.   Dilation: 1 Effacement (%): Thick Cervical Position: Posterior Station:  -3 Exam by:: Lawrence,NP   MAU Course  Procedures Results for orders placed or performed during the hospital encounter of 01/18/17 (from the past 24 hour(s))  Urinalysis, Routine w reflex microscopic     Status: Abnormal   Collection Time: 01/18/17  6:33 PM  Result Value Ref Range   Color, Urine STRAW (A) YELLOW   APPearance CLEAR CLEAR   Specific Gravity, Urine 1.004 (L) 1.005 - 1.030   pH 8.0 5.0 - 8.0   Glucose, UA NEGATIVE NEGATIVE mg/dL   Hgb urine dipstick NEGATIVE NEGATIVE   Bilirubin Urine NEGATIVE NEGATIVE   Ketones, ur NEGATIVE NEGATIVE mg/dL   Protein, ur NEGATIVE NEGATIVE mg/dL   Nitrite NEGATIVE NEGATIVE   Leukocytes, UA NEGATIVE NEGATIVE    MDM Cervix unchanged from previous visit PO hydration Care turned over to [facility] CNM       Judeth Horn, NP 01/18/2017 8:01 PM  2106: Patient reports that contractions are less intense than when she arrived. She would like to be rechecked and dc home if possible FHT: 145, moderate with 15x15 accels, one variable reactive prior and after Toco: irregular UCs  Assessment and Plan   1. Preterm uterine contractions, antepartum, third trimester   2. [redacted] weeks gestation of pregnancy    DC home Comfort measures reviewed  3rd Trimester precautions  PTL precautions  Fetal kick counts RX: none  Return to MAU as needed FU with OB as planned  Follow-up Information    HARPER,CHARLES A, MD Follow up.   Specialty:  Obstetrics and Gynecology Contact information: [address] [place] [address] [phone]

## 2017-01-18 NOTE — MAU Note (Signed)
PT SAYS  SHE HAS  VAG PAIN- - WAS HERE LAST WEEK  FOR  SAME  -   THINKS  BABY'S  HEAD   IS  LOW.   SAYS WAS HERE EARLIER-   1  CM.    SAYS SHE WAS GETTING UP  A LOG  FROM [address]- FELL ON  ROAD   AND  THINKS  SHE PULLED  SOMETHING  LOWER ABD     PNC- WITH   FAMINA

## 2017-01-18 NOTE — MAU Note (Signed)
Pt has been feeling contractions all day and feeling pressure in her vagina. Feeling burning in her vagina. No discharge or itching. No bleeding or LOF.

## 2017-01-19 ENCOUNTER — Encounter: Payer: Medicaid Other | Admitting: Obstetrics

## 2017-01-19 DIAGNOSIS — R1030 Lower abdominal pain, unspecified: Secondary | ICD-10-CM | POA: Diagnosis not present

## 2017-01-19 DIAGNOSIS — O9989 Other specified diseases and conditions complicating pregnancy, childbirth and the puerperium: Secondary | ICD-10-CM | POA: Diagnosis not present

## 2017-01-19 NOTE — Discharge Instructions (Signed)
Adductor Muscle Strain An adductor muscle strain, also called a groin strain or pull, is an injury to the muscles or tendon on the upper inner part of the thigh. These muscles are called the adductor muscles or groin muscles. They are responsible for moving the leg across the body or pulling the legs together. A muscle strain occurs when a muscle is overstretched and some muscle fibers are torn. An adductor muscle strain can range from mild to severe, depending on how many muscle fibers are affected and whether the muscle fibers are partially or completely torn. Adductor muscle strains usually occur during exercise or participation in sports. The injury often happens when a sudden, violent force is placed on a muscle, stretching the muscle too far. A strain is more likely to occur when your muscles are not warmed up or if you are not properly conditioned. Depending on the severity of the muscle strain, recovery time may vary from a few weeks to several weeks. Severe injuries often require 4-6 weeks for recovery. In those cases, complete healing can take 4-5 months. What are the causes? This injury may be caused by:  Stretching the adductor muscles too far or too suddenly, often during side-to-side motion with an abrupt change in direction.  Putting repeated stress on the adductor muscles over a long period of time.  Performing vigorous activity without properly stretching the adductor muscles beforehand. What are the signs or symptoms? Symptoms of this condition include:  Pain and tenderness in the groin area. This begins as sharp pain and persists as a dull ache.  A popping or snapping feeling when the injury occurs (for severe strains).  Swelling or bruising.  Muscle spasms.  Weakness in the leg.  Stiffness in the groin area with decreased ability to move the affected muscles.  How is this treated? An adductor strain will often heal on its own. Typically, treatment will involve  protecting the injured area, rest, ice, pressure (compression), and elevation. This is often called PRICE therapy. Your health care provider may also prescribe medicines to help manage pain and swelling (anti-inflammatory medicine). You may be told to use crutches for the first few days to minimize your pain. Follow these instructions at home: PRICE Therapy   Protect the muscle from being injured again.  Rest. Do not use the strained muscle if it causes pain.  If directed, apply ice to the injured area:  Put ice in a plastic bag.  Place a towel between your skin and the bag.  Leave the ice on for 20 minutes, 2-3 times a day. Do this for the first 2 days after the injury.  Apply compression by wrapping the injured area with an elastic bandage as told by your health care provider.  Raise (elevate) the injured area above the level of your heart while you are sitting or lying down. General instructions   Take over-the-counter and prescription medicines only as told by your health care provider.  Walk, stretch, and do exercises as told by your health care provider. Only do these activities if you can do so without any pain. How is this prevented?  Warm up and stretch before being active.  Cool down and stretch after being active.  Give your body time to rest between periods of activity.  Make sure to use equipment that fits you.  Be safe and responsible while being active to avoid slips and falls.  Maintain physical fitness, including:  Proper conditioning in the adductor muscles.  Overall strength,  flexibility, and endurance. Contact a health care provider if:  You have increased pain or swelling in the affected area.  Your symptoms are not improving or they are getting worse. This information is not intended to replace advice given to you by your health care provider. Make sure you discuss any questions you have with your health care provider. Document Released: 05/23/2004  Document Revised: 04/14/2016 Document Reviewed: 06/29/2015 Elsevier Interactive Patient Education  2017 [employer].

## 2017-01-22 DIAGNOSIS — Z3483 Encounter for supervision of other normal pregnancy, third trimester: Secondary | ICD-10-CM

## 2017-01-25 ENCOUNTER — Ambulatory Visit (INDEPENDENT_AMBULATORY_CARE_PROVIDER_SITE_OTHER): Payer: Medicaid Other | Admitting: Certified Nurse Midwife

## 2017-01-25 VITALS — BP 117/76 | HR 89 | Wt 173.0 lb

## 2017-01-25 DIAGNOSIS — Z3480 Encounter for supervision of other normal pregnancy, unspecified trimester: Secondary | ICD-10-CM

## 2017-01-25 DIAGNOSIS — O4703 False labor before 37 completed weeks of gestation, third trimester: Secondary | ICD-10-CM

## 2017-01-25 DIAGNOSIS — Z3483 Encounter for supervision of other normal pregnancy, third trimester: Secondary | ICD-10-CM

## 2017-01-25 DIAGNOSIS — O9989 Other specified diseases and conditions complicating pregnancy, childbirth and the puerperium: Secondary | ICD-10-CM

## 2017-01-25 DIAGNOSIS — O99891 Other specified diseases and conditions complicating pregnancy: Secondary | ICD-10-CM

## 2017-01-25 DIAGNOSIS — M549 Dorsalgia, unspecified: Secondary | ICD-10-CM

## 2017-01-25 NOTE — Progress Notes (Signed)
   PRENATAL VISIT NOTE  Subjective:  Jennifer Leach is a 36 y.o. G4P3003 at [redacted]w[redacted]d being seen today for ongoing prenatal care.  She is currently monitored for the following issues for this low-risk pregnancy and has Supervision of other normal pregnancy, antepartum; Abnormal laboratory test; Back pain affecting pregnancy; and Premature cervical dilation in third trimester on her problem list.  Patient reports backache, no bleeding, no leaking and occasional contractions.  Contractions: Irritability. Vag. Bleeding: None.  Movement: Present. Denies leaking of fluid.   The following portions of the patient's history were reviewed and updated as appropriate: allergies, current medications, past family history, past medical history, past social history, past surgical history and problem list. Problem list updated.  Objective:   Vitals:   01/25/17 1350  BP: 117/76  Pulse: 89  Weight: 173 lb (78.5 kg)    Fetal Status: Fetal Heart Rate (bpm): 145 Fundal Height: 34 cm Movement: Present     General:  Alert, oriented and cooperative. Patient is in no acute distress.  Skin: Skin is warm and dry. No rash noted.   Cardiovascular: Normal heart rate noted  Respiratory: Normal respiratory effort, no problems with respiration noted  Abdomen: Soft, gravid, appropriate for gestational age. Pain/Pressure: Present     Pelvic:  Cervical exam performed Dilation: 1 Effacement (%): 60 Station: -3, +3  Extremities: Normal range of motion.  Edema: Trace  Mental Status: Normal mood and affect. Normal behavior. Normal judgment and thought content.  NST: + accels, no decels, moderate variability, Cat. 1 tracing. No contractions on toco.   Assessment and Plan:  Pregnancy: G4P3003 at [redacted]w[redacted]d  1. Supervision of other normal pregnancy, antepartum     No change in cervix from prior exam.  NST for contractions.    2. Back pain affecting pregnancy in third trimester       Preterm labor symptoms and general  obstetric precautions including but not limited to vaginal bleeding, contractions, leaking of fluid and fetal movement were reviewed in detail with the patient. Please refer to After Visit Summary for other counseling recommendations.  Return in about 1 week (around 02/01/2017) for ROB, GBS.   Roe Coombs, CNM

## 2017-02-01 ENCOUNTER — Encounter: Payer: Self-pay | Admitting: Certified Nurse Midwife

## 2017-02-01 ENCOUNTER — Ambulatory Visit (INDEPENDENT_AMBULATORY_CARE_PROVIDER_SITE_OTHER): Payer: Medicaid Other | Admitting: Certified Nurse Midwife

## 2017-02-01 VITALS — BP 121/76 | HR 95 | Wt 171.9 lb

## 2017-02-01 DIAGNOSIS — Z3480 Encounter for supervision of other normal pregnancy, unspecified trimester: Secondary | ICD-10-CM

## 2017-02-01 DIAGNOSIS — M549 Dorsalgia, unspecified: Secondary | ICD-10-CM

## 2017-02-01 DIAGNOSIS — O99891 Other specified diseases and conditions complicating pregnancy: Secondary | ICD-10-CM

## 2017-02-01 DIAGNOSIS — O9989 Other specified diseases and conditions complicating pregnancy, childbirth and the puerperium: Secondary | ICD-10-CM

## 2017-02-01 DIAGNOSIS — O26893 Other specified pregnancy related conditions, third trimester: Secondary | ICD-10-CM

## 2017-02-01 DIAGNOSIS — O3433 Maternal care for cervical incompetence, third trimester: Secondary | ICD-10-CM | POA: Diagnosis not present

## 2017-02-01 LAB — OB RESULTS CONSOLE GBS: GBS: NEGATIVE

## 2017-02-01 NOTE — Progress Notes (Signed)
   PRENATAL VISIT NOTE  Subjective:  Jennifer L LEEA RAMBEAU37 y.o. [phone] at [redacted]w[redacted]d being seen today for ongoing prenatal care.  She is currently monitored for the following issues for this low-risk pregnancy and has Supervision of other normal pregnancy, antepartum; Back pain affecting pregnancy; and Premature cervical dilation in third trimester on her problem list.  Patient reports no bleeding, no leaking and reports contractions for weeks with increased vaginal pressure, is not taking procardia because she does not like the effects, is not currently working.  Contractions: Irritability. Vag. Bleeding: None.  Movement: Present. Denies leaking of fluid.   The following portions of the patient's history were reviewed and updated as appropriate: allergies, current medications, past family history, past medical history, past social history, past surgical history and problem list. Problem list updated.  Objective:   Vitals:   02/01/17 1019  BP: 121/76  Pulse: 95  Weight: 171 lb 14.4 oz (78 kg)    Fetal Status: Fetal Heart Rate (bpm): 144 Fundal Height: 35 cm Movement: Present  Presentation: Vertex  General:  Alert, oriented and cooperative. Patient is in no acute distress.  Skin: Skin is warm and dry. No rash noted.   Cardiovascular: Normal heart rate noted  Respiratory: Normal respiratory effort, no problems with respiration noted  Abdomen: Soft, gravid, appropriate for gestational age. Pain/Pressure: Present     Pelvic:  Cervical exam performed Dilation: 1.5 Effacement (%): 80 Station: -2  Extremities: Normal range of motion.  Edema: None  Mental Status: Normal mood and affect. Normal behavior. Normal judgment and thought content.   NST: + accels, no decels, moderate variability, Cat. 1 tracing. 1 contractions on toco with irritability with fetal movement noted.   Assessment and Plan:  Pregnancy: G4P3003 at [redacted]w[redacted]d  1. Supervision of other normal pregnancy, antepartum     Reactive  NST.  NST for reported contractions.  - Strep Gp B NAA  2. Premature cervical dilation in third trimester     Cervical change since last exam, reviewed with Dr. Clearance Coots; no change in current POC: strict labor precautions given.  Has f/u ROB scheduled for Monday.    3. Back pain affecting pregnancy in third trimester     Has abdominal support belt  Preterm labor symptoms and general obstetric precautions including but not limited to vaginal bleeding, contractions, leaking of fluid and fetal movement were reviewed in detail with the patient. Please refer to After Visit Summary for other counseling recommendations.  Return in about 4 days (around 02/05/2017) for ROB.   Roe Coombs, CNM

## 2017-02-03 LAB — STREP GP B NAA: STREP GROUP B AG: NEGATIVE

## 2017-02-04 ENCOUNTER — Inpatient Hospital Stay (HOSPITAL_COMMUNITY)
Admission: AD | Admit: 2017-02-04 | Discharge: 2017-02-04 | Disposition: A | Discharge status: Home / Self Care | Payer: Medicaid Other | Source: Ambulatory Visit | Attending: Obstetrics & Gynecology | Admitting: Obstetrics & Gynecology

## 2017-02-04 ENCOUNTER — Encounter (HOSPITAL_COMMUNITY): Payer: Self-pay

## 2017-02-04 DIAGNOSIS — R102 Pelvic and perineal pain: Secondary | ICD-10-CM | POA: Diagnosis not present

## 2017-02-04 DIAGNOSIS — O4703 False labor before 37 completed weeks of gestation, third trimester: Secondary | ICD-10-CM | POA: Insufficient documentation

## 2017-02-04 DIAGNOSIS — Z3A35 35 weeks gestation of pregnancy: Secondary | ICD-10-CM | POA: Diagnosis not present

## 2017-02-04 DIAGNOSIS — O26893 Other specified pregnancy related conditions, third trimester: Secondary | ICD-10-CM | POA: Insufficient documentation

## 2017-02-04 DIAGNOSIS — O479 False labor, unspecified: Secondary | ICD-10-CM | POA: Diagnosis not present

## 2017-02-04 DIAGNOSIS — O26899 Other specified pregnancy related conditions, unspecified trimester: Secondary | ICD-10-CM | POA: Diagnosis not present

## 2017-02-04 LAB — URINALYSIS, ROUTINE W REFLEX MICROSCOPIC
Bilirubin Urine: NEGATIVE
Glucose, UA: NEGATIVE mg/dL
HGB URINE DIPSTICK: NEGATIVE
KETONES UR: NEGATIVE mg/dL
Leukocytes, UA: NEGATIVE
Nitrite: NEGATIVE
PROTEIN: NEGATIVE mg/dL
Specific Gravity, Urine: 1.004 — ABNORMAL LOW (ref 1.005–1.030)
pH: 6 (ref 5.0–8.0)

## 2017-02-04 NOTE — MAU Provider Note (Signed)
CC:  Chief Complaint  Patient presents with  . Vaginal Pain     First Provider Initiated Contact with Patient 02/04/17 1424      HPI: Jennifer Leach is a 36 y.o. year old G11P3003 female at [redacted]w[redacted]d weeks gestation who presents to MAU reporting Worsening contractions And constant pelvic pressure. Has been evaluated for similar symptoms in maternity admissions and at [facility] numerous times in the past month. Last cervical exam 1.5/80/-2.  Associated Sx:  Vaginal bleeding: Denies Leaking of fluid: Denies Fetal movement: Normal Negative for GI complaints, urinary complaints, fever, chills  O: Patient Vitals for the past 24 hrs:  BP Temp Temp src Pulse Resp SpO2 Weight  02/04/17 1435 117/70 98.4 F (36.9 C) Oral 99 16 100 % -  02/04/17 1332 124/69 97.7 F (36.5 C) - 84 18 - 175 lb (79.4 kg)    General: Flat affect, miserable and tired-appearing but does not appear to be in significant pain Heart: Regular rate Lungs: Normal rate and effort Abd: Soft, NT, Gravid, S=D Pelvic: NEFG, No blood. Physiologic discharge Dilation: 1.5 Effacement (%): 80 Station: -2 Presentation: Vertex Exam by:: Dorathy Kinsman CNM  EFM: 140-145, reactive Toco: Frequent uterine irritability. Soft to palpation.  A: [redacted]w[redacted]d week IUP Deberah Pelton without evidence of active preterm labor Pelvic pain pregnancy, likely due to multiparity FHR reactive  P: Discharge home in stable condition. Preterm labor precautions and fetal kick counts. Recommend maternity support belt, warm baths. Patient states nothing helps. Follow-up as scheduled for prenatal visit or sooner as needed if symptoms worsen. Return to maternity admissions as needed if symptoms worsen.  [place], CNM 10/02/2016 11:40 PM  3

## 2017-02-04 NOTE — MAU Note (Signed)
Pt presents to MAU with complaints of pelvic pressure that started yesterday and has gotten much worse today. Denies any vaginal bleeding or LOF

## 2017-02-04 NOTE — Discharge Instructions (Signed)

## 2017-02-04 NOTE — Progress Notes (Addendum)
G4P3 @ 35+[redacted] wksga. Presents to triage for pelvic pressure since last night around 7pm. deniesl LOF or bleeding. +FM. EFM applied. VSS see flow sheet for details.   1401: turned pt and adjusted monitors  1430: Provider at bs assessing. SVE: 1.5/80/-2  1440: discharge instructions given with pt understanding.   1456: Pt left unit via ambulatory.

## 2017-02-05 ENCOUNTER — Ambulatory Visit (INDEPENDENT_AMBULATORY_CARE_PROVIDER_SITE_OTHER): Payer: Medicaid Other | Admitting: Certified Nurse Midwife

## 2017-02-05 ENCOUNTER — Encounter: Payer: Self-pay | Admitting: Certified Nurse Midwife

## 2017-02-05 VITALS — BP 113/76 | HR 73 | Wt 174.6 lb

## 2017-02-05 DIAGNOSIS — O3433 Maternal care for cervical incompetence, third trimester: Secondary | ICD-10-CM

## 2017-02-05 DIAGNOSIS — Z3483 Encounter for supervision of other normal pregnancy, third trimester: Secondary | ICD-10-CM

## 2017-02-05 DIAGNOSIS — Z3480 Encounter for supervision of other normal pregnancy, unspecified trimester: Secondary | ICD-10-CM

## 2017-02-05 NOTE — Progress Notes (Signed)
Patient reports good fetal movement, complains of contractions that come and go.

## 2017-02-05 NOTE — Progress Notes (Signed)
   PRENATAL VISIT NOTE  Subjective:  Jennifer Leach is a 36 y.o. [phone] at [redacted]w[redacted]d being seen today for ongoing prenatal care.  She is currently monitored for the following issues for this low-risk pregnancy and has Supervision of other normal pregnancy, antepartum; Back pain affecting pregnancy; and Premature cervical dilation in third trimester on her problem list.  Patient reports no bleeding, no leaking and occasional contractions.  Contractions: Irregular. Vag. Bleeding: None.  Movement: Present. Denies leaking of fluid.   The following portions of the patient's history were reviewed and updated as appropriate: allergies, current medications, past family history, past medical history, past social history, past surgical history and problem list. Problem list updated.  Objective:   Vitals:   02/05/17 1010  BP: 113/76  Pulse: 73  Weight: 174 lb 9.6 oz (79.2 kg)    Fetal Status: Fetal Heart Rate (bpm): 132 Fundal Height: 36 cm Movement: Present     General:  Alert, oriented and cooperative. Patient is in no acute distress.  Skin: Skin is warm and dry. No rash noted.   Cardiovascular: Normal heart rate noted  Respiratory: Normal respiratory effort, no problems with respiration noted  Abdomen: Soft, gravid, appropriate for gestational age. Pain/Pressure: Present     Pelvic:  Cervical exam deferred        Extremities: Normal range of motion.  Edema: None  Mental Status: Normal mood and affect. Normal behavior. Normal judgment and thought content.   Assessment and Plan:  Pregnancy: G4P3003 at [redacted]w[redacted]d  1. Supervision of other normal pregnancy, antepartum     Doing well  2. Premature cervical dilation in third trimester     S/P BMZ X2: 4/9/&01/16/17.   Preterm labor symptoms and general obstetric precautions including but not limited to vaginal bleeding, contractions, leaking of fluid and fetal movement were reviewed in detail with the patient. Please refer to After Visit Summary for  other counseling recommendations.  Return in about 1 week (around 02/12/2017) for ROB.   Roe Coombs, CNM

## 2017-02-09 ENCOUNTER — Inpatient Hospital Stay (HOSPITAL_COMMUNITY)
Admission: AD | Admit: 2017-02-09 | Discharge: 2017-02-09 | Discharge status: Against Medical Advice | Payer: Medicaid Other | Source: Ambulatory Visit | Attending: Obstetrics and Gynecology | Admitting: Obstetrics and Gynecology

## 2017-02-09 ENCOUNTER — Inpatient Hospital Stay (HOSPITAL_COMMUNITY): Payer: Medicaid Other

## 2017-02-09 ENCOUNTER — Encounter: Payer: Medicaid Other | Admitting: Certified Nurse Midwife

## 2017-02-09 ENCOUNTER — Encounter (HOSPITAL_COMMUNITY): Payer: Self-pay

## 2017-02-09 DIAGNOSIS — Z87891 Personal history of nicotine dependence: Secondary | ICD-10-CM | POA: Insufficient documentation

## 2017-02-09 DIAGNOSIS — Z5321 Procedure and treatment not carried out due to patient leaving prior to being seen by health care provider: Secondary | ICD-10-CM | POA: Insufficient documentation

## 2017-02-09 DIAGNOSIS — Z8249 Family history of ischemic heart disease and other diseases of the circulatory system: Secondary | ICD-10-CM | POA: Insufficient documentation

## 2017-02-09 DIAGNOSIS — R102 Pelvic and perineal pain: Secondary | ICD-10-CM | POA: Insufficient documentation

## 2017-02-09 DIAGNOSIS — W19XXXA Unspecified fall, initial encounter: Secondary | ICD-10-CM

## 2017-02-09 DIAGNOSIS — R109 Unspecified abdominal pain: Secondary | ICD-10-CM | POA: Insufficient documentation

## 2017-02-09 DIAGNOSIS — W0110XA Fall on same level from slipping, tripping and stumbling with subsequent striking against unspecified object, initial encounter: Secondary | ICD-10-CM | POA: Diagnosis not present

## 2017-02-09 DIAGNOSIS — Z3689 Encounter for other specified antenatal screening: Secondary | ICD-10-CM | POA: Diagnosis not present

## 2017-02-09 DIAGNOSIS — M549 Dorsalgia, unspecified: Secondary | ICD-10-CM | POA: Diagnosis not present

## 2017-02-09 DIAGNOSIS — O9A213 Injury, poisoning and certain other consequences of external causes complicating pregnancy, third trimester: Secondary | ICD-10-CM | POA: Insufficient documentation

## 2017-02-09 DIAGNOSIS — Z3A36 36 weeks gestation of pregnancy: Secondary | ICD-10-CM

## 2017-02-09 DIAGNOSIS — Z882 Allergy status to sulfonamides status: Secondary | ICD-10-CM | POA: Insufficient documentation

## 2017-02-09 DIAGNOSIS — O26893 Other specified pregnancy related conditions, third trimester: Secondary | ICD-10-CM | POA: Diagnosis not present

## 2017-02-09 DIAGNOSIS — Z8619 Personal history of other infectious and parasitic diseases: Secondary | ICD-10-CM | POA: Diagnosis not present

## 2017-02-09 LAB — CBC
HCT: 31.8 % — ABNORMAL LOW (ref 36.0–46.0)
Hemoglobin: 11 g/dL — ABNORMAL LOW (ref 12.0–15.0)
MCH: 30.6 pg (ref 26.0–34.0)
MCHC: 34.6 g/dL (ref 30.0–36.0)
MCV: 88.6 fL (ref 78.0–100.0)
PLATELETS: 130 10*3/uL — AB (ref 150–400)
RBC: 3.59 MIL/uL — ABNORMAL LOW (ref 3.87–5.11)
RDW: 13.8 % (ref 11.5–15.5)
WBC: 8.1 10*3/uL (ref 4.0–10.5)

## 2017-02-09 LAB — TYPE AND SCREEN
ABO/RH(D): A POS
Antibody Screen: NEGATIVE

## 2017-02-09 LAB — KLEIHAUER-BETKE STAIN
# VIALS RHIG: 1
Fetal Cells %: 0 %
Quantitation Fetal Hemoglobin: 0 mL

## 2017-02-09 MED ORDER — CYCLOBENZAPRINE HCL 10 MG PO TABS
10.0000 mg | ORAL_TABLET | Freq: Three times a day (TID) | ORAL | Status: DC | PRN
Start: 2017-02-09 — End: 2017-02-09
  Administered 2017-02-09: 10 mg via ORAL
  Filled 2017-02-09: qty 1

## 2017-02-09 NOTE — MAU Note (Signed)
Patient presents having fallen getting off of her shower onto abdomen, having abdominal pain.

## 2017-02-09 NOTE — MAU Note (Signed)
Instructed patient to return for the following reasons, decreased fetal movement, vaginal bleeding, hardness of abdomen that is persistent, contractions, patient verbalized an understanding and signed out AMA having to pick up her children from school.

## 2017-02-09 NOTE — MAU Provider Note (Signed)
History     CSN: [id number]  Arrival date and time: 02/09/17 1112   First Provider Initiated Contact with Patient 02/09/17 1132      Chief Complaint  Patient presents with  . Fall  . Abdominal Pain  . Vaginal Pain   G4P3003 @36 .2 here after a fall. She reports slipping while getting out of the shower about 30 minutes ago. She landed forward on abdomen and made contact with the side of the tub. She saw one spot of pink blood since. She reports intermittent LAP since the fall. Rates 8/10. Reports good FM. Having some irregular ctx and back pain.    OB History    Gravida Para Term Preterm AB Living   4 3 3    0 3   SAB TAB Ectopic Multiple Live Births     0     3      Past Medical History:  Diagnosis Date  . Gonorrhea   . MRSA (methicillin resistant staph aureus) culture positive 08/06/2014    Past Surgical History:  Procedure Laterality Date  . NO PAST SURGERIES      Family History  Problem Relation Age of Onset  . Hypertension Mother     Social History  Substance Use Topics  . Smoking status: Former Smoker    Packs/day: 0.25    Years: 9.00    Types: Cigarettes    Quit date: 06/09/2012  . Smokeless tobacco: Former Neurosurgeon  . Alcohol use No    Allergies:  Allergies  Allergen Reactions  . Septra Ds [Sulfamethoxazole-Trimethoprim] Anaphylaxis, Swelling, Rash and Other (See Comments)    Reaction:  Facial swelling     Prescriptions Prior to Admission  Medication Sig Dispense Refill Last Dose  . acetaminophen (TYLENOL) 325 MG tablet Take 650 mg by mouth every 6 (six) hours as needed for mild pain.    02/08/2017 at Unknown time  . cyclobenzaprine (FLEXERIL) 10 MG tablet Take 10 mg by mouth 3 (three) times daily as needed.   1 02/08/2017 at Unknown time  . omeprazole (PRILOSEC) 20 MG capsule Take 1 capsule by mouth daily as needed (heartburn).   5 Past Month at Unknown time    Review of Systems  Gastrointestinal: Positive for abdominal pain.  Genitourinary: Positive  for vaginal bleeding.  Musculoskeletal: Positive for back pain.   Physical Exam   Blood pressure (!) 106/53, pulse 90, temperature 98.6 F (37 C), resp. rate 16, last menstrual period 05/26/2016.  Physical Exam  Constitutional: She is oriented to person, place, and time. She appears well-developed and well-nourished. No distress (appears slightly uncomfortable).  HENT:  Head: Normocephalic and atraumatic.  Neck: Normal range of motion.  Cardiovascular: Normal rate.   Respiratory: Effort normal.  GI: Soft. She exhibits no distension. There is no tenderness.  gravid  Genitourinary:  Genitourinary Comments: SVE 2/70/-2, vtx, no blood  Musculoskeletal: Normal range of motion.  Neurological: She is alert and oriented to person, place, and time.  Skin: Skin is warm and dry.  Psychiatric: She has a normal mood and affect.   EFM: 135 bpm, mod variability, + accels, no decels Toco: irregular  Results for orders placed or performed during the hospital encounter of 02/09/17 (from the past 24 hour(s))  CBC     Status: Abnormal   Collection Time: 02/09/17 11:57 AM  Result Value Ref Range   WBC 8.1 4.0 - 10.5 K/uL   RBC 3.59 (L) 3.87 - 5.11 MIL/uL   Hemoglobin 11.0 (L) 12.0 -  15.0 g/dL   HCT 16.131.8 (L) 09.636.0 - 04.546.0 %   MCV 88.6 78.0 - 100.0 fL   MCH 30.6 26.0 - 34.0 pg   MCHC 34.6 30.0 - 36.0 g/dL   RDW 40.913.8 81.111.5 - 91.415.5 %   Platelets 130 (L) 150 - 400 K/uL  Type and screen     Status: None   Collection Time: 02/09/17 11:57 AM  Result Value Ref Range   ABO/RH(D) A POS    Antibody Screen NEG    Sample Expiration 02/12/2017    [place] Mfm Ob Limited  Result Date: 02/09/2017 ----------------------------------------------------------------------  OBSTETRICS REPORT                      (Signed Final 02/09/2017 01:13 pm) ---------------------------------------------------------------------- Patient Info  ID #:       [id number]                         D.O.B.:   10/31/1980 (35 yrs)  Name:       Jennifer Leach               Visit Date:  02/09/2017 12:03 pm ---------------------------------------------------------------------- Performed By  Performed By:     Raoul PitchLora Mae Murrow        Ref. Address:     [address]                    [address]                                                             [address]                                                             [place], [place]                                                             [id number]  Attending:        Charlsie MerlesMark Newman MD         Secondary Phy.:   MAU Nursing-                                                             MAU/Triage  Referred By:      Vikki PortsMELANIE N              [facility]                    Tanis Hensarling CNM ---------------------------------------------------------------------- Orders   #  Description  Code   1  [place] MFM OB LIMITED                           [id number]  ----------------------------------------------------------------------   #  Ordered By               Order #        Accession #    Episode #   1  Donette Larry          [id number]      [id number]     [id number]  ---------------------------------------------------------------------- Indications   [redacted] weeks gestation of pregnancy                Z3A.36   Traumatic injury during pregnancy (fall)       O9A.219 T14.90   Abdominal pain in pregnancy                    O99.89  ---------------------------------------------------------------------- OB History  Gravidity:    4         Term:   3  Living:       3 ---------------------------------------------------------------------- Fetal Evaluation  Num Of Fetuses:     1  Fetal Heart         136  Rate(bpm):  Cardiac Activity:   Observed  Presentation:       Cephalic  Placenta:           Fundal, above cervical os  Amniotic Fluid  AFI FV:      Subjectively within normal limits  AFI Sum(cm)     %Tile       Largest Pocket(cm)  16.28           60          4.69  RUQ(cm)       RLQ(cm)       LUQ(cm)        LLQ(cm)   3.92          3.64          4.69           4.03 ---------------------------------------------------------------------- Gestational Age  Best:          36w 2d    Det. ByMarcella Dubs         EDD:   03/07/17                                      (07/10/16) ---------------------------------------------------------------------- Impression  IUP at 36+2 weeks, S/P fall  Normal fetal movement and cardiac activity  Normal placentation  Normal amniotic fluid levels. There is some flocculent  material in the amniotic fluid consistent with vernix or possible  fresh bleeding (not clot) ---------------------------------------------------------------------- Recommendations  No evidence of fetal compromise  Continue clinical evaluation and management  Consider a prolonged monitoring window (2-3 hours) to rule  out ongoing blood loss ----------------------------------------------------------------------                 Charlsie Merles, MD Electronically Signed Final Report   02/09/2017 01:13 pm ----------------------------------------------------------------------  MAU Course  Procedures Flexeril Heating pad  MDM Labs and [place] ordered and reviewed. No obvious abruption seen on [place]. Abdomen soft and non-tender. No current evidence for abruption.Reports pain improved some after Flexeril. Discussed with pt I recommend a minimum of 4 hr observation and potentially 24 hrs if pain continues,  and pt states she must leave at 1430 to get her kids off the bus. I told her about the potential consequences with a fall ie: abruption which is associated with poor fetal outcome, and this may not be evident immediately after the fall. I strongly recommended against her leaving without an extended evaluation and pt reports that she has no one to get her kids and she must go.  Assessment and Plan  [redacted] weeks gestation S/p fall NST reactive  AMA Abruption precautions discussed  Donette Larry, CNM 02/09/2017, 11:46 AM

## 2017-02-12 ENCOUNTER — Inpatient Hospital Stay (HOSPITAL_COMMUNITY)
Admission: AD | Admit: 2017-02-12 | Discharge: 2017-02-12 | Disposition: A | Discharge status: Home / Self Care | Payer: Medicaid Other | Source: Ambulatory Visit | Attending: Obstetrics & Gynecology | Admitting: Obstetrics & Gynecology

## 2017-02-12 ENCOUNTER — Encounter (HOSPITAL_COMMUNITY): Payer: Self-pay | Admitting: *Deleted

## 2017-02-12 DIAGNOSIS — Z8614 Personal history of Methicillin resistant Staphylococcus aureus infection: Secondary | ICD-10-CM | POA: Diagnosis not present

## 2017-02-12 DIAGNOSIS — Z882 Allergy status to sulfonamides status: Secondary | ICD-10-CM | POA: Insufficient documentation

## 2017-02-12 DIAGNOSIS — N930 Postcoital and contact bleeding: Secondary | ICD-10-CM | POA: Diagnosis not present

## 2017-02-12 DIAGNOSIS — Z3A36 36 weeks gestation of pregnancy: Secondary | ICD-10-CM | POA: Diagnosis not present

## 2017-02-12 DIAGNOSIS — Z87891 Personal history of nicotine dependence: Secondary | ICD-10-CM | POA: Insufficient documentation

## 2017-02-12 DIAGNOSIS — O4693 Antepartum hemorrhage, unspecified, third trimester: Secondary | ICD-10-CM

## 2017-02-12 LAB — URINALYSIS, ROUTINE W REFLEX MICROSCOPIC
Bilirubin Urine: NEGATIVE
Glucose, UA: NEGATIVE mg/dL
Ketones, ur: NEGATIVE mg/dL
LEUKOCYTES UA: NEGATIVE
Nitrite: NEGATIVE
Protein, ur: NEGATIVE mg/dL
Specific Gravity, Urine: 1.002 — ABNORMAL LOW (ref 1.005–1.030)
pH: 7 (ref 5.0–8.0)

## 2017-02-12 NOTE — Discharge Instructions (Signed)
Vaginal Bleeding During Pregnancy, Third Trimester A small amount of bleeding (spotting) from the vagina is common in pregnancy. Sometimes the bleeding is normal and is not a problem, and sometimes it is a sign of something serious. Be sure to tell your doctor about any bleeding from your vagina right away. Follow these instructions at home:  Watch your condition for any changes.  Follow your doctor's instructions about how active you can be.  If you are on bed rest:  You may need to stay in bed and only get up to use the bathroom.  You may be allowed to do some activities.  If you need help, make plans for someone to help you.  Write down:  The number of pads you use each day.  How often you change pads.  How soaked (saturated) your pads are.  Do not use tampons.  Do not douche.  Do not have sex or orgasms until your doctor says it is okay.  Follow your doctor's advice about lifting, driving, and doing physical activities.  If you pass any tissue from your vagina, save the tissue so you can show it to your doctor.  Only take medicines as told by your doctor.  Do not take aspirin because it can make you bleed.  Keep all follow-up visits as told by your doctor. Contact a doctor if:  You bleed from your vagina.  You have cramps.  You have labor pains.  You have a fever that does not go away after you take medicine. Get help right away if:  You have very bad cramps in your back or belly (abdomen).  You have chills.  You have a gush of fluid from your vagina.  You pass large clots or tissue from your vagina.  You bleed more.  You feel light-headed or weak.  You pass out (faint).  You do not feel your baby move around as much as before. This information is not intended to replace advice given to you by your health care provider. Make sure you discuss any questions you have with your health care provider. Document Released: 02/09/2014 Document Revised:  03/02/2016 Document Reviewed: 06/02/2013 Elsevier Interactive Patient Education  2017 Elsevier Inc.   Pelvic Rest Pelvic rest may be recommended if:  Your placenta is partially or completely covering the opening of your cervix (placenta previa).  There is bleeding between the wall of the uterus and the amniotic sac in the first trimester of pregnancy (subchorionic hemorrhage).  You went into labor too early (preterm labor). Based on your overall health and the health of your baby, your health care provider will decide if pelvic rest is right for you. How do I rest my pelvis? For as long as told by your health care provider:  Do not have sex, sexual stimulation, or an orgasm.  Do not use tampons. Do not douche. Do not put anything in your vagina.  Do not lift anything that is heavier than 10 lb (4.5 kg).  Avoid activities that take a lot of effort (are strenuous).  Avoid any activity in which your pelvic muscles could become strained. When should I seek medical care? Seek medical care if you have:  Cramping pain in your lower abdomen.  Vaginal discharge.  A low, dull backache.  Regular contractions.  Uterine tightening. When should I seek immediate medical care? Seek immediate medical care if:  You have vaginal bleeding and you are pregnant. This information is not intended to replace advice given to you  by your health care provider. Make sure you discuss any questions you have with your health care provider. Document Released: 01/20/2011 Document Revised: 03/02/2016 Document Reviewed: 03/29/2015 Elsevier Interactive Patient Education  2017 [employer].

## 2017-02-12 NOTE — MAU Note (Signed)
Patient presents to mau with c/o bright red vaginal bleeding that started this am following intercourse. Denies pain at this time.

## 2017-02-12 NOTE — MAU Note (Signed)
Urine in lab 

## 2017-02-12 NOTE — MAU Provider Note (Signed)
History     CSN: [id number]  Arrival date and time: 02/12/17 1324   First Provider Initiated Contact with Patient 02/12/17 [phone]      Chief Complaint  Patient presents with  . Vaginal Bleeding   HPI   [name] is a 36 y.o. female [phone] @ [redacted]w[redacted]d here in MAU with complaints of vaginal bleeding following intercourse. Patient says she had intercourse this morning and noted bleeding during and following intercourse. She denies abdominal pain, however does have some vaginal pain.   OB History    Gravida Para Term Preterm AB Living   4 3 3    0 3   SAB TAB Ectopic Multiple Live Births     0     3      Past Medical History:  Diagnosis Date  . Gonorrhea   . MRSA (methicillin resistant staph aureus) culture positive 08/06/2014    Past Surgical History:  Procedure Laterality Date  . NO PAST SURGERIES      Family History  Problem Relation Age of Onset  . Hypertension Mother     Social History  Substance Use Topics  . Smoking status: Former Smoker    Packs/day: 0.25    Years: 9.00    Types: Cigarettes    Quit date: 06/09/2012  . Smokeless tobacco: Former Neurosurgeon  . Alcohol use No    Allergies:  Allergies  Allergen Reactions  . Septra Ds [Sulfamethoxazole-Trimethoprim] Anaphylaxis, Swelling, Rash and Other (See Comments)    Reaction:  Facial swelling     Prescriptions Prior to Admission  Medication Sig Dispense Refill Last Dose  . acetaminophen (TYLENOL) 325 MG tablet Take 650 mg by mouth every 6 (six) hours as needed for mild pain.    02/08/2017 at Unknown time  . cyclobenzaprine (FLEXERIL) 10 MG tablet Take 10 mg by mouth 3 (three) times daily as needed.   1 02/08/2017 at Unknown time  . omeprazole (PRILOSEC) 20 MG capsule Take 1 capsule by mouth daily as needed (heartburn).   5 Past Month at Unknown time   Results for orders placed or performed during the hospital encounter of 02/12/17 (from the past 48 hour(s))  Urinalysis, Routine w reflex microscopic      Status: Abnormal   Collection Time: 02/12/17  9:28 AM  Result Value Ref Range   Color, Urine YELLOW YELLOW   APPearance CLEAR CLEAR   Specific Gravity, Urine 1.002 (L) 1.005 - 1.030   pH 7.0 5.0 - 8.0   Glucose, UA NEGATIVE NEGATIVE mg/dL   Hgb urine dipstick LARGE (A) NEGATIVE   Bilirubin Urine NEGATIVE NEGATIVE   Ketones, ur NEGATIVE NEGATIVE mg/dL   Protein, ur NEGATIVE NEGATIVE mg/dL   Nitrite NEGATIVE NEGATIVE   Leukocytes, UA NEGATIVE NEGATIVE   RBC / HPF 6-30 0 - 5 RBC/hpf   WBC, UA 0-5 0 - 5 WBC/hpf   Bacteria, UA RARE (A) NONE SEEN   Squamous Epithelial / LPF 0-5 (A) NONE SEEN   Review of Systems  Gastrointestinal: Negative for abdominal pain.  Genitourinary: Positive for vaginal bleeding and vaginal pain.   Physical Exam   Blood pressure 134/88, pulse 91, temperature 97.7 F (36.5 C), resp. rate 16, last menstrual period 05/26/2016, SpO2 100 %.  Physical Exam  Constitutional: She is oriented to person, place, and time. She appears well-developed and well-nourished. No distress.  HENT:  Head: Normocephalic.  Eyes: Pupils are equal, round, and reactive to light.  Respiratory: Effort normal.  GI: Soft. She exhibits  no distension. There is no tenderness. There is no rebound.  Genitourinary:  Genitourinary Comments: Vagina - Small amount of dark red blood Cervix - Quarter size clot noted at cervix. No active bleeding from os after clot was removed.  Bimanual exam: Cervix Dilation: 2 Effacement (%): 80 Exam by:: Venia CarbonJennifer Rasch, NP Chaperone present for exam.   Musculoskeletal: Normal range of motion.  Neurological: She is alert and oriented to person, place, and time.  Skin: Skin is warm. She is not diaphoretic.  Psychiatric: Her behavior is normal.    Fetal Tracing: Baseline: 130 bpm Variability: Moderate  Accelerations: 15x15 Decelerations: none Toco: Occasional UI   MAU Course  Procedures  None  MDM  A positive blood type Repeat speculum shows no  active bleeding. No blood in the vaginal canal. Small amount of pink, mucoid discharge at the os. Discussed with Dr. Debroah LoopArnold.   Assessment and Plan   A:    ICD-9-CM ICD-10-CM   1. Vaginal bleeding in pregnancy, third trimester 641.93 O46.93   2. PCB (post coital bleeding) 626.7 N93.0     P:  Discharge home in stable condition.  Return to MAU if symptoms worsen Bleeding precautions  Pelvic rest Preterm labor precautions.    Venia Carbonasch, Jennifer I, NP 02/12/2017 2:02 PM

## 2017-02-14 ENCOUNTER — Telehealth: Payer: Self-pay | Admitting: *Deleted

## 2017-02-14 NOTE — Telephone Encounter (Signed)
Spoke with pt Disability company regarding additional information needed to process claim. Information provided over the phone. Any other information request will be sent as needed.

## 2017-02-15 ENCOUNTER — Ambulatory Visit (INDEPENDENT_AMBULATORY_CARE_PROVIDER_SITE_OTHER): Payer: Medicaid Other | Admitting: Certified Nurse Midwife

## 2017-02-15 ENCOUNTER — Encounter (HOSPITAL_COMMUNITY): Payer: Self-pay

## 2017-02-15 ENCOUNTER — Inpatient Hospital Stay (HOSPITAL_COMMUNITY)
Admission: AD | Admit: 2017-02-15 | Discharge: 2017-02-15 | Disposition: A | Discharge status: Home / Self Care | Payer: Medicaid Other | Source: Ambulatory Visit | Attending: Obstetrics and Gynecology | Admitting: Obstetrics and Gynecology

## 2017-02-15 VITALS — BP 108/69 | HR 92 | Wt 178.5 lb

## 2017-02-15 DIAGNOSIS — Z79899 Other long term (current) drug therapy: Secondary | ICD-10-CM | POA: Insufficient documentation

## 2017-02-15 DIAGNOSIS — Z882 Allergy status to sulfonamides status: Secondary | ICD-10-CM | POA: Diagnosis not present

## 2017-02-15 DIAGNOSIS — Z3483 Encounter for supervision of other normal pregnancy, third trimester: Secondary | ICD-10-CM

## 2017-02-15 DIAGNOSIS — Z3480 Encounter for supervision of other normal pregnancy, unspecified trimester: Secondary | ICD-10-CM

## 2017-02-15 DIAGNOSIS — M549 Dorsalgia, unspecified: Secondary | ICD-10-CM | POA: Insufficient documentation

## 2017-02-15 DIAGNOSIS — O26893 Other specified pregnancy related conditions, third trimester: Secondary | ICD-10-CM

## 2017-02-15 DIAGNOSIS — Z8249 Family history of ischemic heart disease and other diseases of the circulatory system: Secondary | ICD-10-CM | POA: Diagnosis not present

## 2017-02-15 DIAGNOSIS — O9989 Other specified diseases and conditions complicating pregnancy, childbirth and the puerperium: Secondary | ICD-10-CM | POA: Diagnosis not present

## 2017-02-15 DIAGNOSIS — O479 False labor, unspecified: Secondary | ICD-10-CM

## 2017-02-15 DIAGNOSIS — O99891 Other specified diseases and conditions complicating pregnancy: Secondary | ICD-10-CM

## 2017-02-15 DIAGNOSIS — Z87891 Personal history of nicotine dependence: Secondary | ICD-10-CM | POA: Insufficient documentation

## 2017-02-15 DIAGNOSIS — R109 Unspecified abdominal pain: Secondary | ICD-10-CM | POA: Insufficient documentation

## 2017-02-15 DIAGNOSIS — R110 Nausea: Secondary | ICD-10-CM | POA: Diagnosis not present

## 2017-02-15 DIAGNOSIS — Z3A37 37 weeks gestation of pregnancy: Secondary | ICD-10-CM | POA: Diagnosis present

## 2017-02-15 LAB — CBC WITH DIFFERENTIAL/PLATELET
BASOS ABS: 0 10*3/uL (ref 0.0–0.1)
BASOS PCT: 0 %
Eosinophils Absolute: 0 10*3/uL (ref 0.0–0.7)
Eosinophils Relative: 0 %
HEMATOCRIT: 31.2 % — AB (ref 36.0–46.0)
HEMOGLOBIN: 10.8 g/dL — AB (ref 12.0–15.0)
LYMPHS PCT: 15 %
Lymphs Abs: 1.9 10*3/uL (ref 0.7–4.0)
MCH: 30.8 pg (ref 26.0–34.0)
MCHC: 34.6 g/dL (ref 30.0–36.0)
MCV: 88.9 fL (ref 78.0–100.0)
MONO ABS: 0.9 10*3/uL (ref 0.1–1.0)
Monocytes Relative: 7 %
NEUTROS ABS: 10.3 10*3/uL — AB (ref 1.7–7.7)
Neutrophils Relative %: 78 %
Platelets: 121 10*3/uL — ABNORMAL LOW (ref 150–400)
RBC: 3.51 MIL/uL — AB (ref 3.87–5.11)
RDW: 14.1 % (ref 11.5–15.5)
WBC: 13.1 10*3/uL — ABNORMAL HIGH (ref 4.0–10.5)

## 2017-02-15 LAB — URINALYSIS, ROUTINE W REFLEX MICROSCOPIC
Bilirubin Urine: NEGATIVE
GLUCOSE, UA: NEGATIVE mg/dL
Ketones, ur: NEGATIVE mg/dL
Nitrite: NEGATIVE
PROTEIN: NEGATIVE mg/dL
Specific Gravity, Urine: 1.002 — ABNORMAL LOW (ref 1.005–1.030)
pH: 7 (ref 5.0–8.0)

## 2017-02-15 LAB — COMPREHENSIVE METABOLIC PANEL
ALBUMIN: 2.9 g/dL — AB (ref 3.5–5.0)
ALK PHOS: 148 U/L — AB (ref 38–126)
ALT: 9 U/L — ABNORMAL LOW (ref 14–54)
AST: 22 U/L (ref 15–41)
Anion gap: 8 (ref 5–15)
BILIRUBIN TOTAL: 1.3 mg/dL — AB (ref 0.3–1.2)
CO2: 23 mmol/L (ref 22–32)
CREATININE: 0.64 mg/dL (ref 0.44–1.00)
Calcium: 8.3 mg/dL — ABNORMAL LOW (ref 8.9–10.3)
Chloride: 105 mmol/L (ref 101–111)
GFR calc Af Amer: >60 mL/min (ref >60)
Glucose, Bld: 83 mg/dL (ref 65–99)
POTASSIUM: 3.3 mmol/L — AB (ref 3.5–5.1)
Sodium: 136 mmol/L (ref 135–145)
TOTAL PROTEIN: 6.2 g/dL — AB (ref 6.5–8.1)

## 2017-02-15 MED ORDER — MORPHINE SULFATE (PF) 4 MG/ML IV SOLN
4.0000 mg | Freq: Once | INTRAVENOUS | Status: AC
  Administered 2017-02-15: 4 mg via INTRAMUSCULAR
  Filled 2017-02-15: qty 1

## 2017-02-15 MED ORDER — PROMETHAZINE HCL 25 MG PO TABS
25.0000 mg | ORAL_TABLET | Freq: Once | ORAL | Status: AC
  Administered 2017-02-15: 25 mg via ORAL
  Filled 2017-02-15: qty 1

## 2017-02-15 NOTE — Discharge Instructions (Signed)
Third Trimester of Pregnancy The third trimester is from week 28 through week 40 (months 7 through 9). The third trimester is a time when the unborn baby (fetus) is growing rapidly. At the end of the ninth month, the fetus is about 20 inches in length and weighs 6-10 pounds. Body changes during your third trimester Your body will continue to go through many changes during pregnancy. The changes vary from woman to woman. During the third trimester:  Your weight will continue to increase. You can expect to gain 25-35 pounds (11-16 kg) by the end of the pregnancy.  You may begin to get stretch marks on your hips, abdomen, and breasts.  You may urinate more often because the fetus is moving lower into your pelvis and pressing on your bladder.  You may develop or continue to have heartburn. This is caused by increased hormones that slow down muscles in the digestive tract.  You may develop or continue to have constipation because increased hormones slow digestion and cause the muscles that push waste through your intestines to relax.  You may develop hemorrhoids. These are swollen veins (varicose veins) in the rectum that can itch or be painful.  You may develop swollen, bulging veins (varicose veins) in your legs.  You may have increased body aches in the pelvis, back, or thighs. This is due to weight gain and increased hormones that are relaxing your joints.  You may have changes in your hair. These can include thickening of your hair, rapid growth, and changes in texture. Some women also have hair loss during or after pregnancy, or hair that feels dry or thin. Your hair will most likely return to normal after your baby is born.  Your breasts will continue to grow and they will continue to become tender. A yellow fluid (colostrum) may leak from your breasts. This is the first milk you are producing for your baby.  Your belly button may stick out.  You may notice more swelling in your hands,  face, or ankles.  You may have increased tingling or numbness in your hands, arms, and legs. The skin on your belly may also feel numb.  You may feel short of breath because of your expanding uterus.  You may have more problems sleeping. This can be caused by the size of your belly, increased need to urinate, and an increase in your body's metabolism.  You may notice the fetus "dropping," or moving lower in your abdomen (lightening).  You may have increased vaginal discharge.  You may notice your joints feel loose and you may have pain around your pelvic bone.  What to expect at prenatal visits You will have prenatal exams every 2 weeks until week 36. Then you will have weekly prenatal exams. During a routine prenatal visit:  You will be weighed to make sure you and the baby are growing normally.  Your blood pressure will be taken.  Your abdomen will be measured to track your baby's growth.  The fetal heartbeat will be listened to.  Any test results from the previous visit will be discussed.  You may have a cervical check near your due date to see if your cervix has softened or thinned (effaced).  You will be tested for Group B streptococcus. This happens between 35 and 37 weeks.  Your health care provider may ask you:  What your birth plan is.  How you are feeling.  If you are feeling the baby move.  If you have had   any abnormal symptoms, such as leaking fluid, bleeding, severe headaches, or abdominal cramping.  If you are using any tobacco products, including cigarettes, chewing tobacco, and electronic cigarettes.  If you have any questions.  Other tests or screenings that may be performed during your third trimester include:  Blood tests that check for low iron levels (anemia).  Fetal testing to check the health, activity level, and growth of the fetus. Testing is done if you have certain medical conditions or if there are problems during the  pregnancy.  Nonstress test (NST). This test checks the health of your baby to make sure there are no signs of problems, such as the baby not getting enough oxygen. During this test, a belt is placed around your belly. The baby is made to move, and its heart rate is monitored during movement.  What is false labor? False labor is a condition in which you feel small, irregular tightenings of the muscles in the womb (contractions) that usually go away with rest, changing position, or drinking water. These are called Braxton Hicks contractions. Contractions may last for hours, days, or even weeks before true labor sets in. If contractions come at regular intervals, become more frequent, increase in intensity, or become painful, you should see your health care provider. What are the signs of labor?  Abdominal cramps.  Regular contractions that start at 10 minutes apart and become stronger and more frequent with time.  Contractions that start on the top of the uterus and spread down to the lower abdomen and back.  Increased pelvic pressure and dull back pain.  A watery or bloody mucus discharge that comes from the vagina.  Leaking of amniotic fluid. This is also known as your "water breaking." It could be a slow trickle or a gush. Let your health care provider know if it has a color or strange odor. If you have any of these signs, call your health care provider right away, even if it is before your due date. Follow these instructions at home: Medicines  Follow your health care provider's instructions regarding medicine use. Specific medicines may be either safe or unsafe to take during pregnancy.  Take a prenatal vitamin that contains at least 600 micrograms (mcg) of folic acid.  If you develop constipation, try taking a stool softener if your health care provider approves. Eating and drinking  Eat a balanced diet that includes fresh fruits and vegetables, whole grains, good sources of protein  such as meat, eggs, or tofu, and low-fat dairy. Your health care provider will help you determine the amount of weight gain that is right for you.  Avoid raw meat and uncooked cheese. These carry germs that can cause birth defects in the baby.  If you have low calcium intake from food, talk to your health care provider about whether you should take a daily calcium supplement.  Eat four or five small meals rather than three large meals a day.  Limit foods that are high in fat and processed sugars, such as fried and sweet foods.  To prevent constipation: ? Drink enough fluid to keep your urine clear or pale yellow. ? Eat foods that are high in fiber, such as fresh fruits and vegetables, whole grains, and beans. Activity  Exercise only as directed by your health care provider. Most women can continue their usual exercise routine during pregnancy. Try to exercise for 30 minutes at least 5 days a week. Stop exercising if you experience uterine contractions.  Avoid heavy   lifting.  Do not exercise in extreme heat or humidity, or at high altitudes.  Wear low-heel, comfortable shoes.  Practice good posture.  You may continue to have sex unless your health care provider tells you otherwise. Relieving pain and discomfort  Take frequent breaks and rest with your legs elevated if you have leg cramps or low back pain.  Take warm sitz baths to soothe any pain or discomfort caused by hemorrhoids. Use hemorrhoid cream if your health care provider approves.  Wear a good support bra to prevent discomfort from breast tenderness.  If you develop varicose veins: ? Wear support pantyhose or compression stockings as told by your healthcare provider. ? Elevate your feet for 15 minutes, 3-4 times a day. Prenatal care  Write down your questions. Take them to your prenatal visits.  Keep all your prenatal visits as told by your health care provider. This is important. Safety  Wear your seat belt at  all times when driving.  Make a list of emergency phone numbers, including numbers for family, friends, the hospital, and police and fire departments. General instructions  Avoid cat litter boxes and soil used by cats. These carry germs that can cause birth defects in the baby. If you have a cat, ask someone to clean the litter box for you.  Do not travel far distances unless it is absolutely necessary and only with the approval of your health care provider.  Do not use hot tubs, steam rooms, or saunas.  Do not drink alcohol.  Do not use any products that contain nicotine or tobacco, such as cigarettes and e-cigarettes. If you need help quitting, ask your health care provider.  Do not use any medicinal herbs or unprescribed drugs. These chemicals affect the formation and growth of the baby.  Do not douche or use tampons or scented sanitary pads.  Do not cross your legs for long periods of time.  To prepare for the arrival of your baby: ? Take prenatal classes to understand, practice, and ask questions about labor and delivery. ? Make a trial run to the hospital. ? Visit the hospital and tour the maternity area. ? Arrange for maternity or paternity leave through employers. ? Arrange for family and friends to take care of pets while you are in the hospital. ? Purchase a rear-facing car seat and make sure you know how to install it in your car. ? Pack your hospital bag. ? Prepare the baby's nursery. Make sure to remove all pillows and stuffed animals from the baby's crib to prevent suffocation.  Visit your dentist if you have not gone during your pregnancy. Use a soft toothbrush to brush your teeth and be gentle when you floss. Contact a health care provider if:  You are unsure if you are in labor or if your water has broken.  You become dizzy.  You have mild pelvic cramps, pelvic pressure, or nagging pain in your abdominal area.  You have lower back pain.  You have persistent  nausea, vomiting, or diarrhea.  You have an unusual or bad smelling vaginal discharge.  You have pain when you urinate. Get help right away if:  Your water breaks before 37 weeks.  You have regular contractions less than 5 minutes apart before 37 weeks.  You have a fever.  You are leaking fluid from your vagina.  You have spotting or bleeding from your vagina.  You have severe abdominal pain or cramping.  You have rapid weight loss or weight gain.    You have shortness of breath with chest pain.  You notice sudden or extreme swelling of your face, hands, ankles, feet, or legs.  Your baby makes fewer than 10 movements in 2 hours.  You have severe headaches that do not go away when you take medicine.  You have vision changes. Summary  The third trimester is from week 28 through week 40, months 7 through 9. The third trimester is a time when the unborn baby (fetus) is growing rapidly.  During the third trimester, your discomfort may increase as you and your baby continue to gain weight. You may have abdominal, leg, and back pain, sleeping problems, and an increased need to urinate.  During the third trimester your breasts will keep growing and they will continue to become tender. A yellow fluid (colostrum) may leak from your breasts. This is the first milk you are producing for your baby.  False labor is a condition in which you feel small, irregular tightenings of the muscles in the womb (contractions) that eventually go away. These are called Braxton Hicks contractions. Contractions may last for hours, days, or even weeks before true labor sets in.  Signs of labor can include: abdominal cramps; regular contractions that start at 10 minutes apart and become stronger and more frequent with time; watery or bloody mucus discharge that comes from the vagina; increased pelvic pressure and dull back pain; and leaking of amniotic fluid. This information is not intended to replace advice  given to you by your health care provider. Make sure you discuss any questions you have with your health care provider. Document Released: 09/19/2001 Document Revised: 03/02/2016 Document Reviewed: 11/26/2012 [employer] Interactive Patient Education  2017 [employer]  

## 2017-02-15 NOTE — Progress Notes (Signed)
   PRENATAL VISIT NOTE  Subjective:  Jennifer Leach is a 36 y.o. [id number] at 7145w1d being seen today for ongoing prenatal care.  She is currently monitored for the following issues for this low-risk pregnancy and has Supervision of other normal pregnancy, antepartum; Back pain affecting pregnancy; and Premature cervical dilation in third trimester on her problem list.  Patient reports no bleeding, no leaking and occasional contractions.  Contractions: Irregular. Vag. Bleeding: None.  Movement: Present. Denies leaking of fluid.   The following portions of the patient's history were reviewed and updated as appropriate: allergies, current medications, past family history, past medical history, past social history, past surgical history and problem list. Problem list updated.  Objective:   Vitals:   02/15/17 0854  BP: 108/69  Pulse: 92  Weight: 178 lb 8 oz (81 kg)    Fetal Status: Fetal Heart Rate (bpm): 142 Fundal Height: 37 cm Movement: Present     General:  Alert, oriented and cooperative. Patient is in no acute distress.  Skin: Skin is warm and dry. No rash noted.   Cardiovascular: Normal heart rate noted  Respiratory: Normal respiratory effort, no problems with respiration noted  Abdomen: Soft, gravid, appropriate for gestational age. Pain/Pressure: Present     Pelvic:  Cervical exam deferred Dilation: 2 Effacement (%): 70 Station: -2 Cervix 2/70/-2  Extremities: Normal range of motion.  Edema: Trace  Mental Status: Normal mood and affect. Normal behavior. Normal judgment and thought content.   Assessment and Plan:  Pregnancy: [id number] at 6845w1d  1.Supervision of other normal pregnancy, antepartum   Patient doing well ,s/p MAU visit for fall. Back pain affecting pregnancy in third trimester Patient instructed on yoga stretching and exercises  Term labor symptoms and general obstetric precautions including but not limited to vaginal bleeding, contractions, leaking of fluid and  fetal movement were reviewed in detail with the patient. Please refer to After Visit Summary for other counseling recommendations.  1 WK F/UP ROB  Roe Coombsenney, Rachelle A, CNM

## 2017-02-15 NOTE — MAU Note (Signed)
PT  SAYS SHE WAS IN OFFICE TODAY - VE  2 CM.  FEELING UC'S SINCE  3 PM.     GBS- NEG

## 2017-02-15 NOTE — Progress Notes (Signed)
Patient reports good fetal movement, complais of irregular contractions with pressure and occassionally feeling lightheaded.

## 2017-02-15 NOTE — MAU Provider Note (Signed)
Chief Complaint:  No chief complaint on file.   First Provider Initiated Contact with Patient 02/15/17 1950     HPI: Jennifer Leach is a 36 y.o. G4P3003 at 6w1dwho presents to maternity admissions reporting uterine contractions.  While RN was doing a labor evaluation, she noted a temperature of 100 degrees.  Patient reports some nausea and pain with contractions as well as back pain.. Denies cough or respiratory issues. She reports good fetal movement, denies LOF, vaginal bleeding, vaginal itching/burning, urinary symptoms, h/a, dizziness, diarrhea, constipation.  Abdominal Pain  This is a new problem. The current episode started today. The problem occurs intermittently. The problem has been unchanged. The pain is located in the suprapubic region, RLQ and LLQ. The pain is moderate. The quality of the pain is cramping and sharp. The abdominal pain radiates to the back. Associated symptoms include a fever and nausea. Pertinent negatives include no arthralgias, constipation, diarrhea, dysuria, headaches, myalgias or vomiting. The pain is aggravated by palpation. The pain is relieved by nothing. She has tried nothing for the symptoms.     Past Medical History: Past Medical History:  Diagnosis Date  . Gonorrhea   . MRSA (methicillin resistant staph aureus) culture positive 08/06/2014    Past obstetric history: OB History  Gravida Para Term Preterm AB Living  4 3 3    0 3  SAB TAB Ectopic Multiple Live Births    0     3    # Outcome Date GA Lbr Len/2nd Weight Sex Delivery Anes PTL Lv  4 Current           3 Term 09/03/10    M Vag-Spont   LIV  2 Term 09/27/06    F Vag-Spont   LIV  1 Term 07/03/02    Rolm Baptise      Past Surgical History: Past Surgical History:  Procedure Laterality Date  . NO PAST SURGERIES      Family History: Family History  Problem Relation Age of Onset  . Hypertension Mother     Social History: Social History  Substance Use Topics  . Smoking  status: Former Smoker    Packs/day: 0.25    Years: 9.00    Types: Cigarettes    Quit date: 06/09/2012  . Smokeless tobacco: Former Neurosurgeon  . Alcohol use No    Allergies:  Allergies  Allergen Reactions  . Septra Ds [Sulfamethoxazole-Trimethoprim] Anaphylaxis, Swelling, Rash and Other (See Comments)    Reaction:  Facial swelling     Meds:  Prescriptions Prior to Admission  Medication Sig Dispense Refill Last Dose  . acetaminophen (TYLENOL) 325 MG tablet Take 650 mg by mouth every 6 (six) hours as needed for mild pain.    Past Month at Unknown time  . cyclobenzaprine (FLEXERIL) 10 MG tablet Take 10 mg by mouth 3 (three) times daily as needed.   1 Past Month at Unknown time  . omeprazole (PRILOSEC) 20 MG capsule Take 1 capsule by mouth daily as needed (heartburn).   5 02/14/2017 at Unknown time    I have reviewed patient's Past Medical Hx, Surgical Hx, Family Hx, Social Hx, medications and allergies.   ROS:  Review of Systems  Constitutional: Positive for fever.  Gastrointestinal: Positive for abdominal pain and nausea. Negative for constipation, diarrhea and vomiting.  Genitourinary: Negative for dysuria.  Musculoskeletal: Negative for arthralgias and myalgias.  Neurological: Negative for headaches.   Other systems negative  Physical Exam  Patient Vitals for the  past 24 hrs:  BP Temp Temp src Pulse Resp SpO2  02/15/17 2030 - - - (!) 102 - 98 %  02/15/17 2025 - - - (!) 101 - 100 %  02/15/17 2020 - - - 93 - 99 %  02/15/17 2001 - - - 99 - 98 %  02/15/17 2000 114/60 - - 97 - -  02/15/17 1956 - - - 94 - 97 %  02/15/17 1952 123/67 - - 91 - -  02/15/17 1951 - - - (!) 103 - 97 %  02/15/17 1945 115/70 - - (!) 101 - -  02/15/17 1939 140/85 100 F (37.8 C) Oral (!) 104 18 -   Constitutional: Well-developed, well-nourished female in no acute distress.  Cardiovascular: normal rate and rhythm Respiratory: normal effort, clear to auscultation bilaterally GI: Abd soft, tender throughout.   Slightly more tender over right mid-abdomen., gravid appropriate for gestational age.   No rebound or guarding. MS: Extremities nontender, no edema, normal ROM Neurologic: Alert and oriented x 4.  GU: Neg CVAT.  PELVIC EXAM:  Dilation: 2 Effacement (%): 70 Cervical Position: Posterior Station: -3 Exam by:: Ginnie Smart RN  FHT:  Baseline 140 , moderate variability, accelerations present, no decelerations Contractions: q 4-5 mins Irregular   Labs: Results for orders placed or performed during the hospital encounter of 02/15/17 (from the past 24 hour(s))  CBC with Differential/Platelet     Status: Abnormal   Collection Time: 02/15/17  7:57 PM  Result Value Ref Range   WBC 13.1 (H) 4.0 - 10.5 K/uL   RBC 3.51 (L) 3.87 - 5.11 MIL/uL   Hemoglobin 10.8 (L) 12.0 - 15.0 g/dL   HCT 40.9 (L) 81.1 - 91.4 %   MCV 88.9 78.0 - 100.0 fL   MCH 30.8 26.0 - 34.0 pg   MCHC 34.6 30.0 - 36.0 g/dL   RDW 78.2 95.6 - 21.3 %   Platelets 121 (L) 150 - 400 K/uL   Neutrophils Relative % 78 %   Neutro Abs 10.3 (H) 1.7 - 7.7 K/uL   Lymphocytes Relative 15 %   Lymphs Abs 1.9 0.7 - 4.0 K/uL   Monocytes Relative 7 %   Monocytes Absolute 0.9 0.1 - 1.0 K/uL   Eosinophils Relative 0 %   Eosinophils Absolute 0.0 0.0 - 0.7 K/uL   Basophils Relative 0 %   Basophils Absolute 0.0 0.0 - 0.1 K/uL  Comprehensive metabolic panel     Status: Abnormal   Collection Time: 02/15/17  7:57 PM  Result Value Ref Range   Sodium 136 135 - 145 mmol/L   Potassium 3.3 (L) 3.5 - 5.1 mmol/L   Chloride 105 101 - 111 mmol/L   CO2 23 22 - 32 mmol/L   Glucose, Bld 83 65 - 99 mg/dL   BUN <5 (L) 6 - 20 mg/dL   Creatinine, Ser 0.86 0.44 - 1.00 mg/dL   Calcium 8.3 (L) 8.9 - 10.3 mg/dL   Total Protein 6.2 (L) 6.5 - 8.1 g/dL   Albumin 2.9 (L) 3.5 - 5.0 g/dL   AST 22 15 - 41 U/L   ALT 9 (L) 14 - 54 U/L   Alkaline Phosphatase 148 (H) 38 - 126 U/L   Total Bilirubin 1.3 (H) 0.3 - 1.2 mg/dL   GFR calc non Af Amer >60 >60 mL/min    GFR calc Af Amer >60 >60 mL/min   Anion gap 8 5 - 15   --/--/A POS (05/04 1157)  Imaging:  [place] Mfm Ob  Limited  Result Date: 02/09/2017 ----------------------------------------------------------------------  OBSTETRICS REPORT                      (Signed Final 02/09/2017 01:13 pm) ---------------------------------------------------------------------- Patient Info  ID #:       [id number]                         D.O.B.:   September 27, 1981 (35 yrs)  Name:       Jennifer Leach               Visit Date:  02/09/2017 12:03 pm ---------------------------------------------------------------------- Performed By  Performed By:     Raoul Pitch        Ref. Address:     [address]                                                             [place], [place]                                                             [address]  Attending:        Charlsie Merles MD         Secondary Phy.:   MAU Nursing-                                                             MAU/Triage  Referred By:      Vikki Ports              Location:         [facility] CNM ---------------------------------------------------------------------- Orders   #  Description                                 Code   1  [place] MFM OB LIMITED                           [phone]  ----------------------------------------------------------------------   #  Ordered By               Order #        Accession #    Episode #   1  MELANIE BHAMBRI          [id number]      [id number]     [id number]  ---------------------------------------------------------------------- Indications   [redacted] weeks gestation of pregnancy                Z3A.36   Traumatic injury during pregnancy (fall)       O9A.219 T14.90   Abdominal pain in pregnancy                    O99.89  ---------------------------------------------------------------------- OB History  Gravidity:    4         Term:   3   Living:       3 ---------------------------------------------------------------------- Fetal Evaluation  Num Of Fetuses:     1  Fetal Heart         136  Rate(bpm):  Cardiac Activity:   Observed  Presentation:       Cephalic  Placenta:           Fundal, above cervical os  Amniotic Fluid  AFI FV:      Subjectively within normal limits  AFI Sum(cm)     %Tile       Largest Pocket(cm)  16.28           60          4.69  RUQ(cm)       RLQ(cm)       LUQ(cm)        LLQ(cm)  3.92          3.64          4.69           4.03 ---------------------------------------------------------------------- Gestational Age  Best:          36w 2d    Det. ByMarcella Dubs:   Early Ultrasound         EDD:   03/07/17                                      (07/10/16) ---------------------------------------------------------------------- Impression  IUP at 36+2 weeks, S/P fall  Normal fetal movement and cardiac activity  Normal placentation  Normal amniotic fluid levels. There is some flocculent  material in the amniotic fluid consistent with vernix or possible  fresh bleeding (not clot) ---------------------------------------------------------------------- Recommendations  No evidence of fetal compromise  Continue clinical evaluation and management  Consider a prolonged monitoring window (2-3 hours) to rule  out ongoing blood loss ----------------------------------------------------------------------                 Charlsie MerlesMark Newman, MD Electronically Signed Final Report   02/09/2017 01:13 pm ----------------------------------------------------------------------   MAU Course/MDM: I have ordered labs and reviewed results. No significant leukocytosis outside expected related to labor NST reviewed and found to be reactive, category I Consult Dr Vergie LivingPickens with presentation, exam findings and test results. He recommends UA, if normal may discharge home Treatments in MAU included Analgesia with morphine and Phenergan .    Assessment: Single IUP at 1372w1d Low grade  fever, ?viral syndrome Latent vs prodromal labor, not in active labor  Plan: Discharge home Labor precautions and fetal kick counts Follow up in Office for prenatal visits and recheck of status Encouraged to return here or to other Urgent Care/ED if she develops worsening of symptoms, increase in pain, fever, or other concerning symptoms.    Pt stable at time of discharge.  Wynelle Bourgeois CNM, MSN Certified Nurse-Midwife 02/15/2017 8:54 PM

## 2017-02-17 LAB — CULTURE, OB URINE: CULTURE: NO GROWTH

## 2017-02-18 ENCOUNTER — Inpatient Hospital Stay (HOSPITAL_COMMUNITY)
Admission: AD | Admit: 2017-02-18 | Discharge: 2017-02-21 | DRG: 767 | Disposition: A | Discharge status: Home / Self Care | Payer: Medicaid Other | Source: Ambulatory Visit | Attending: Obstetrics and Gynecology | Admitting: Obstetrics and Gynecology

## 2017-02-18 ENCOUNTER — Encounter (HOSPITAL_COMMUNITY): Payer: Self-pay | Admitting: *Deleted

## 2017-02-18 DIAGNOSIS — O4292 Full-term premature rupture of membranes, unspecified as to length of time between rupture and onset of labor: Principal | ICD-10-CM | POA: Diagnosis present

## 2017-02-18 DIAGNOSIS — Z87891 Personal history of nicotine dependence: Secondary | ICD-10-CM | POA: Diagnosis not present

## 2017-02-18 DIAGNOSIS — Z8249 Family history of ischemic heart disease and other diseases of the circulatory system: Secondary | ICD-10-CM

## 2017-02-18 DIAGNOSIS — Z3A37 37 weeks gestation of pregnancy: Secondary | ICD-10-CM

## 2017-02-18 DIAGNOSIS — Z302 Encounter for sterilization: Secondary | ICD-10-CM

## 2017-02-18 DIAGNOSIS — Z8614 Personal history of Methicillin resistant Staphylococcus aureus infection: Secondary | ICD-10-CM | POA: Diagnosis not present

## 2017-02-18 DIAGNOSIS — Z9851 Tubal ligation status: Secondary | ICD-10-CM

## 2017-02-18 LAB — CBC
HCT: 31.9 % — ABNORMAL LOW (ref 36.0–46.0)
Hemoglobin: 10.8 g/dL — ABNORMAL LOW (ref 12.0–15.0)
MCH: 30.2 pg (ref 26.0–34.0)
MCHC: 33.9 g/dL (ref 30.0–36.0)
MCV: 89.1 fL (ref 78.0–100.0)
Platelets: 133 10*3/uL — ABNORMAL LOW (ref 150–400)
RBC: 3.58 MIL/uL — AB (ref 3.87–5.11)
RDW: 14 % (ref 11.5–15.5)
WBC: 8.9 10*3/uL (ref 4.0–10.5)

## 2017-02-18 LAB — TYPE AND SCREEN
ABO/RH(D): A POS
Antibody Screen: NEGATIVE

## 2017-02-18 LAB — POCT FERN TEST: POCT Fern Test: POSITIVE

## 2017-02-18 MED ORDER — FLEET ENEMA 7-19 GM/118ML RE ENEM: 1.0000 | ENEMA | RECTAL | Status: DC | PRN

## 2017-02-18 MED ORDER — OXYCODONE-ACETAMINOPHEN 5-325 MG PO TABS: 1.0000 | ORAL_TABLET | ORAL | Status: DC | PRN

## 2017-02-18 MED ORDER — LACTATED RINGERS IV SOLN
INTRAVENOUS | Status: DC
  Administered 2017-02-19: 16:00:00 via INTRAVENOUS

## 2017-02-18 MED ORDER — OXYTOCIN 40 UNITS IN LACTATED RINGERS INFUSION - SIMPLE MED
2.5000 [IU]/h | INTRAVENOUS | Status: DC
  Filled 2017-02-18: qty 1000

## 2017-02-18 MED ORDER — LIDOCAINE HCL (PF) 1 % IJ SOLN
30.0000 mL | INTRAMUSCULAR | Status: DC | PRN
  Filled 2017-02-18: qty 30

## 2017-02-18 MED ORDER — OXYCODONE-ACETAMINOPHEN 5-325 MG PO TABS: 2.0000 | ORAL_TABLET | ORAL | Status: DC | PRN

## 2017-02-18 MED ORDER — ACETAMINOPHEN 325 MG PO TABS
650.0000 mg | ORAL_TABLET | ORAL | Status: DC | PRN
  Administered 2017-02-18 – 2017-02-19 (×2): 650 mg via ORAL
  Filled 2017-02-18 (×2): qty 2

## 2017-02-18 MED ORDER — SOD CITRATE-CITRIC ACID 500-334 MG/5ML PO SOLN
30.0000 mL | ORAL | Status: DC | PRN
  Administered 2017-02-18: 30 mL via ORAL
  Filled 2017-02-18: qty 15

## 2017-02-18 MED ORDER — LACTATED RINGERS IV SOLN
500.0000 mL | INTRAVENOUS | Status: DC | PRN
  Administered 2017-02-19: 500 mL via INTRAVENOUS

## 2017-02-18 MED ORDER — ONDANSETRON HCL 4 MG/2ML IJ SOLN
4.0000 mg | Freq: Four times a day (QID) | INTRAMUSCULAR | Status: DC | PRN
  Administered 2017-02-19 (×2): 4 mg via INTRAVENOUS
  Filled 2017-02-18 (×2): qty 2

## 2017-02-18 MED ORDER — OXYTOCIN BOLUS FROM INFUSION
500.0000 mL | Freq: Once | INTRAVENOUS | Status: AC
  Administered 2017-02-19: 500 mL via INTRAVENOUS

## 2017-02-18 NOTE — MAU Note (Signed)
Pt reports she noticed some white discharge in her underwear. Dried off and started feeling wetness. Not sure if it is urine or not. Having some cramping not very uncomfortable.

## 2017-02-19 ENCOUNTER — Inpatient Hospital Stay (HOSPITAL_COMMUNITY): Payer: Medicaid Other | Admitting: Anesthesiology

## 2017-02-19 ENCOUNTER — Encounter (HOSPITAL_COMMUNITY): Payer: Self-pay | Admitting: *Deleted

## 2017-02-19 ENCOUNTER — Encounter (HOSPITAL_COMMUNITY)
Admission: AD | Disposition: A | Discharge status: Home / Self Care | Payer: Self-pay | Source: Ambulatory Visit | Attending: Obstetrics and Gynecology

## 2017-02-19 DIAGNOSIS — Z3A37 37 weeks gestation of pregnancy: Secondary | ICD-10-CM

## 2017-02-19 DIAGNOSIS — Z302 Encounter for sterilization: Secondary | ICD-10-CM

## 2017-02-19 HISTORY — PX: TUBAL LIGATION: SHX77

## 2017-02-19 LAB — RPR: RPR Ser Ql: NONREACTIVE

## 2017-02-19 LAB — MRSA PCR SCREENING: MRSA by PCR: NEGATIVE

## 2017-02-19 SURGERY — LIGATION, FALLOPIAN TUBE, POSTPARTUM
Anesthesia: Epidural | Site: Abdomen | Wound class: Clean Contaminated

## 2017-02-19 MED ORDER — LIDOCAINE-EPINEPHRINE (PF) 2 %-1:200000 IJ SOLN
INTRAMUSCULAR | Status: AC
  Filled 2017-02-19: qty 20

## 2017-02-19 MED ORDER — DIPHENHYDRAMINE HCL 50 MG/ML IJ SOLN: 12.5000 mg | INTRAMUSCULAR | Status: DC | PRN

## 2017-02-19 MED ORDER — LACTATED RINGERS IV SOLN: 500.0000 mL | Freq: Once | INTRAVENOUS | Status: DC

## 2017-02-19 MED ORDER — NALBUPHINE HCL 10 MG/ML IJ SOLN: 5.0000 mg | INTRAMUSCULAR | Status: DC | PRN

## 2017-02-19 MED ORDER — KETOROLAC TROMETHAMINE 30 MG/ML IJ SOLN
INTRAMUSCULAR | Status: DC | PRN
  Administered 2017-02-19: 30 mg via INTRAVENOUS

## 2017-02-19 MED ORDER — SODIUM CHLORIDE 0.9 % IR SOLN
Status: DC | PRN
Start: 2017-02-19 — End: 2017-02-19
  Administered 2017-02-19: 1

## 2017-02-19 MED ORDER — FENTANYL CITRATE (PF) 100 MCG/2ML IJ SOLN
INTRAMUSCULAR | Status: AC
  Administered 2017-02-19: 100 ug via INTRAVENOUS
  Filled 2017-02-19: qty 2

## 2017-02-19 MED ORDER — DIPHENHYDRAMINE HCL 25 MG PO CAPS: 25.0000 mg | ORAL_CAPSULE | Freq: Four times a day (QID) | ORAL | Status: DC | PRN

## 2017-02-19 MED ORDER — EPHEDRINE 5 MG/ML INJ: 10.0000 mg | INTRAVENOUS | Status: DC | PRN

## 2017-02-19 MED ORDER — PHENYLEPHRINE 40 MCG/ML (10ML) SYRINGE FOR IV PUSH (FOR BLOOD PRESSURE SUPPORT): 80.0000 ug | PREFILLED_SYRINGE | INTRAVENOUS | Status: DC | PRN

## 2017-02-19 MED ORDER — DIBUCAINE 1 % RE OINT: 1.0000 "application " | TOPICAL_OINTMENT | RECTAL | Status: DC | PRN

## 2017-02-19 MED ORDER — IBUPROFEN 600 MG PO TABS
600.0000 mg | ORAL_TABLET | Freq: Four times a day (QID) | ORAL | Status: DC
  Administered 2017-02-19 – 2017-02-21 (×6): 600 mg via ORAL
  Filled 2017-02-19 (×7): qty 1

## 2017-02-19 MED ORDER — OXYCODONE-ACETAMINOPHEN 5-325 MG PO TABS
1.0000 | ORAL_TABLET | ORAL | Status: DC | PRN
  Administered 2017-02-19 – 2017-02-21 (×6): 1 via ORAL
  Filled 2017-02-19 (×5): qty 1

## 2017-02-19 MED ORDER — EPHEDRINE 5 MG/ML INJ
10.0000 mg | INTRAVENOUS | Status: DC | PRN
  Filled 2017-02-19: qty 4

## 2017-02-19 MED ORDER — SODIUM BICARBONATE 8.4 % IV SOLN
INTRAVENOUS | Status: AC
Start: 2017-02-19 — End: 2017-02-19
  Filled 2017-02-19: qty 50

## 2017-02-19 MED ORDER — TETANUS-DIPHTH-ACELL PERTUSSIS 5-2.5-18.5 LF-MCG/0.5 IM SUSP: 0.5000 mL | Freq: Once | INTRAMUSCULAR | Status: DC

## 2017-02-19 MED ORDER — COCONUT OIL OIL: 1.0000 "application " | TOPICAL_OIL | Status: DC | PRN

## 2017-02-19 MED ORDER — ACETAMINOPHEN 325 MG PO TABS
650.0000 mg | ORAL_TABLET | ORAL | Status: DC | PRN
  Administered 2017-02-19 – 2017-02-20 (×2): 650 mg via ORAL
  Filled 2017-02-19 (×2): qty 2

## 2017-02-19 MED ORDER — MEPERIDINE HCL 25 MG/ML IJ SOLN
INTRAMUSCULAR | Status: AC
  Filled 2017-02-19: qty 1

## 2017-02-19 MED ORDER — LIDOCAINE HCL (PF) 1 % IJ SOLN
INTRAMUSCULAR | Status: DC | PRN
  Administered 2017-02-19: 5 mL via EPIDURAL

## 2017-02-19 MED ORDER — ONDANSETRON HCL 4 MG PO TABS: 4.0000 mg | ORAL_TABLET | ORAL | Status: DC | PRN

## 2017-02-19 MED ORDER — FENTANYL CITRATE (PF) 100 MCG/2ML IJ SOLN: 25.0000 ug | INTRAMUSCULAR | Status: DC | PRN

## 2017-02-19 MED ORDER — KETOROLAC TROMETHAMINE 30 MG/ML IJ SOLN: 30.0000 mg | Freq: Four times a day (QID) | INTRAMUSCULAR | Status: AC | PRN

## 2017-02-19 MED ORDER — ONDANSETRON HCL 4 MG/2ML IJ SOLN: 4.0000 mg | Freq: Four times a day (QID) | INTRAMUSCULAR | Status: DC | PRN

## 2017-02-19 MED ORDER — TERBUTALINE SULFATE 1 MG/ML IJ SOLN: 0.2500 mg | Freq: Once | INTRAMUSCULAR | Status: DC | PRN

## 2017-02-19 MED ORDER — SCOPOLAMINE 1 MG/3DAYS TD PT72: 1.0000 | MEDICATED_PATCH | Freq: Once | TRANSDERMAL | Status: DC

## 2017-02-19 MED ORDER — FENTANYL 2.5 MCG/ML BUPIVACAINE 1/10 % EPIDURAL INFUSION (WH - ANES): 14.0000 mL/h | INTRAMUSCULAR | Status: DC | PRN

## 2017-02-19 MED ORDER — ONDANSETRON HCL 4 MG/2ML IJ SOLN
INTRAMUSCULAR | Status: AC
  Filled 2017-02-19: qty 2

## 2017-02-19 MED ORDER — PHENYLEPHRINE 40 MCG/ML (10ML) SYRINGE FOR IV PUSH (FOR BLOOD PRESSURE SUPPORT)
80.0000 ug | PREFILLED_SYRINGE | INTRAVENOUS | Status: DC | PRN
  Filled 2017-02-19: qty 10

## 2017-02-19 MED ORDER — NALOXONE HCL 0.4 MG/ML IJ SOLN: 0.4000 mg | INTRAMUSCULAR | Status: DC | PRN

## 2017-02-19 MED ORDER — ZOLPIDEM TARTRATE 5 MG PO TABS: 5.0000 mg | ORAL_TABLET | Freq: Every evening | ORAL | Status: DC | PRN

## 2017-02-19 MED ORDER — PRENATAL MULTIVITAMIN CH
1.0000 | ORAL_TABLET | Freq: Every day | ORAL | Status: DC
  Administered 2017-02-20 – 2017-02-21 (×2): 1 via ORAL
  Filled 2017-02-19 (×2): qty 1

## 2017-02-19 MED ORDER — NALOXONE HCL 2 MG/2ML IJ SOSY: 1.0000 ug/kg/h | PREFILLED_SYRINGE | INTRAVENOUS | Status: DC | PRN

## 2017-02-19 MED ORDER — WITCH HAZEL-GLYCERIN EX PADS: 1.0000 "application " | MEDICATED_PAD | CUTANEOUS | Status: DC | PRN

## 2017-02-19 MED ORDER — EPHEDRINE 5 MG/ML INJ
10.0000 mg | INTRAVENOUS | Status: DC | PRN
  Administered 2017-02-19: 10 mg via INTRAVENOUS

## 2017-02-19 MED ORDER — SIMETHICONE 80 MG PO CHEW
80.0000 mg | CHEWABLE_TABLET | ORAL | Status: DC | PRN
  Administered 2017-02-20: 80 mg via ORAL
  Filled 2017-02-19: qty 1

## 2017-02-19 MED ORDER — SENNOSIDES-DOCUSATE SODIUM 8.6-50 MG PO TABS
2.0000 | ORAL_TABLET | ORAL | Status: DC
  Administered 2017-02-19 – 2017-02-20 (×2): 2 via ORAL
  Filled 2017-02-19 (×2): qty 2

## 2017-02-19 MED ORDER — MIDAZOLAM HCL 2 MG/2ML IJ SOLN
INTRAMUSCULAR | Status: DC | PRN
  Administered 2017-02-19 (×2): 1 mg via INTRAVENOUS

## 2017-02-19 MED ORDER — NALBUPHINE HCL 10 MG/ML IJ SOLN: 5.0000 mg | Freq: Once | INTRAMUSCULAR | Status: DC | PRN

## 2017-02-19 MED ORDER — BUPIVACAINE HCL (PF) 0.25 % IJ SOLN
INTRAMUSCULAR | Status: AC
Start: 2017-02-19 — End: 2017-02-19
  Filled 2017-02-19: qty 30

## 2017-02-19 MED ORDER — PANTOPRAZOLE SODIUM 40 MG PO TBEC: 40.0000 mg | DELAYED_RELEASE_TABLET | Freq: Every day | ORAL | Status: DC

## 2017-02-19 MED ORDER — FENTANYL 2.5 MCG/ML BUPIVACAINE 1/10 % EPIDURAL INFUSION (WH - ANES)
14.0000 mL/h | INTRAMUSCULAR | Status: DC | PRN
  Administered 2017-02-19: 14 mL/h via EPIDURAL
  Filled 2017-02-19: qty 100

## 2017-02-19 MED ORDER — OXYCODONE HCL 5 MG/5ML PO SOLN: 5.0000 mg | Freq: Once | ORAL | Status: DC | PRN

## 2017-02-19 MED ORDER — PHENYLEPHRINE 40 MCG/ML (10ML) SYRINGE FOR IV PUSH (FOR BLOOD PRESSURE SUPPORT)
80.0000 ug | PREFILLED_SYRINGE | INTRAVENOUS | Status: DC | PRN
Start: 2017-02-19 — End: 2017-02-19
  Administered 2017-02-19: 80 ug via INTRAVENOUS

## 2017-02-19 MED ORDER — ONDANSETRON HCL 4 MG/2ML IJ SOLN: 4.0000 mg | INTRAMUSCULAR | Status: DC | PRN

## 2017-02-19 MED ORDER — PANTOPRAZOLE SODIUM 40 MG PO TBEC
40.0000 mg | DELAYED_RELEASE_TABLET | Freq: Every day | ORAL | Status: DC
  Administered 2017-02-19: 40 mg via ORAL
  Filled 2017-02-19: qty 1

## 2017-02-19 MED ORDER — MIDAZOLAM HCL 2 MG/2ML IJ SOLN
INTRAMUSCULAR | Status: AC
  Filled 2017-02-19: qty 2

## 2017-02-19 MED ORDER — ONDANSETRON HCL 4 MG/2ML IJ SOLN: 4.0000 mg | Freq: Three times a day (TID) | INTRAMUSCULAR | Status: DC | PRN

## 2017-02-19 MED ORDER — DIPHENHYDRAMINE HCL 25 MG PO CAPS: 25.0000 mg | ORAL_CAPSULE | ORAL | Status: DC | PRN

## 2017-02-19 MED ORDER — KETOROLAC TROMETHAMINE 30 MG/ML IJ SOLN
INTRAMUSCULAR | Status: AC
  Filled 2017-02-19: qty 1

## 2017-02-19 MED ORDER — OXYTOCIN 40 UNITS IN LACTATED RINGERS INFUSION - SIMPLE MED
1.0000 m[IU]/min | INTRAVENOUS | Status: DC
  Administered 2017-02-19: 2 m[IU]/min via INTRAVENOUS

## 2017-02-19 MED ORDER — BENZOCAINE-MENTHOL 20-0.5 % EX AERO: 1.0000 "application " | INHALATION_SPRAY | CUTANEOUS | Status: DC | PRN

## 2017-02-19 MED ORDER — PHENYLEPHRINE 40 MCG/ML (10ML) SYRINGE FOR IV PUSH (FOR BLOOD PRESSURE SUPPORT)
80.0000 ug | PREFILLED_SYRINGE | INTRAVENOUS | Status: DC | PRN
Start: 2017-02-19 — End: 2017-02-19

## 2017-02-19 MED ORDER — MEPERIDINE HCL 25 MG/ML IJ SOLN
6.2500 mg | INTRAMUSCULAR | Status: DC | PRN
  Administered 2017-02-19: 6.25 mg via INTRAVENOUS

## 2017-02-19 MED ORDER — FENTANYL CITRATE (PF) 100 MCG/2ML IJ SOLN
100.0000 ug | INTRAMUSCULAR | Status: DC | PRN
  Administered 2017-02-19 (×2): 100 ug via INTRAVENOUS
  Administered 2017-02-19 (×2): 50 ug via INTRAVENOUS
  Filled 2017-02-19: qty 2

## 2017-02-19 MED ORDER — OXYCODONE HCL 5 MG PO TABS: 5.0000 mg | ORAL_TABLET | Freq: Once | ORAL | Status: DC | PRN

## 2017-02-19 MED ORDER — SODIUM CHLORIDE 0.9% FLUSH: 3.0000 mL | INTRAVENOUS | Status: DC | PRN

## 2017-02-19 MED ORDER — ONDANSETRON HCL 4 MG/2ML IJ SOLN
INTRAMUSCULAR | Status: DC | PRN
  Administered 2017-02-19: 4 mg via INTRAVENOUS

## 2017-02-19 MED ORDER — FENTANYL CITRATE (PF) 100 MCG/2ML IJ SOLN
INTRAMUSCULAR | Status: AC
  Filled 2017-02-19: qty 2

## 2017-02-19 MED ORDER — SODIUM BICARBONATE 8.4 % IV SOLN
INTRAVENOUS | Status: DC | PRN
  Administered 2017-02-19 (×2): 3 mL via EPIDURAL
  Administered 2017-02-19: 7 mL via EPIDURAL

## 2017-02-19 MED ORDER — LACTATED RINGERS IV SOLN
500.0000 mL | Freq: Once | INTRAVENOUS | Status: AC
  Administered 2017-02-19: 500 mL via INTRAVENOUS

## 2017-02-19 MED ORDER — BUPIVACAINE HCL (PF) 0.25 % IJ SOLN
INTRAMUSCULAR | Status: DC | PRN
  Administered 2017-02-19: 10 mL

## 2017-02-19 SURGICAL SUPPLY — 24 items
CLIP FILSHIE TUBAL LIGA STRL (Clip) ×3 IMPLANT
CLOTH BEACON ORANGE TIMEOUT ST (SAFETY) ×3 IMPLANT
DECANTER SPIKE VIAL GLASS SM (MISCELLANEOUS) ×2 IMPLANT
DRSG OPSITE 4X5.5 SM (GAUZE/BANDAGES/DRESSINGS) ×2 IMPLANT
DRSG OPSITE POSTOP 3X4 (GAUZE/BANDAGES/DRESSINGS) ×3 IMPLANT
DURAPREP 26ML APPLICATOR (WOUND CARE) ×3 IMPLANT
GLOVE BIO SURGEON STRL SZ7.5 (GLOVE) ×3 IMPLANT
GLOVE BIOGEL PI IND STRL 7.0 (GLOVE) ×2 IMPLANT
GLOVE BIOGEL PI INDICATOR 7.0 (GLOVE) ×4
GOWN STRL REUS W/TWL LRG LVL3 (GOWN DISPOSABLE) ×3 IMPLANT
GOWN STRL REUS W/TWL XL LVL3 (GOWN DISPOSABLE) ×3 IMPLANT
NEEDLE HYPO 22GX1.5 SAFETY (NEEDLE) ×5 IMPLANT
NS IRRIG 1000ML POUR BTL (IV SOLUTION) ×3 IMPLANT
PACK ABDOMINAL MINOR (CUSTOM PROCEDURE TRAY) ×3 IMPLANT
PROTECTOR NERVE ULNAR (MISCELLANEOUS) ×3 IMPLANT
SPONGE LAP 4X18 X RAY DECT (DISPOSABLE) IMPLANT
SUT MNCRL AB 4-0 PS2 18 (SUTURE) ×3 IMPLANT
SUT PLAIN 0 NONE (SUTURE) ×3 IMPLANT
SUT VIC AB 0 CT1 27 (SUTURE) ×3
SUT VIC AB 0 CT1 27XBRD ANBCTR (SUTURE) ×1 IMPLANT
SYR CONTROL 10ML LL (SYRINGE) ×5 IMPLANT
TOWEL OR 17X24 6PK STRL BLUE (TOWEL DISPOSABLE) ×6 IMPLANT
TRAY FOLEY CATH SILVER 14FR (SET/KITS/TRAYS/PACK) ×3 IMPLANT
WATER STERILE IRR 1000ML POUR (IV SOLUTION) ×3 IMPLANT

## 2017-02-19 NOTE — Anesthesia Pain Management Evaluation Note (Signed)
  CRNA Pain Management Visit Note  Patient: Jennifer Leach, 36 y.o., female  "Hello I am a member of the anesthesia team at [facility]. We have an anesthesia team available at all times to provide care throughout the hospital, including epidural management and anesthesia for C-section. I don't know your plan for the delivery whether it a natural birth, water birth, IV sedation, nitrous supplementation, doula or epidural, but we want to meet your pain goals."   1.Was your pain managed to your expectations on prior hospitalizations?   Yes   2.What is your expectation for pain management during this hospitalization?     Epidural  3.How can we help you reach that goal? epidural  Record the patient's initial score and the patient's pain goal.   Pain: 8  Pain Goal: 4 [facility] wants you to be able to say your pain was always managed very well.  Jennifer Leach 02/19/2017

## 2017-02-19 NOTE — Lactation Note (Signed)
This note was copied from a baby's chart. Lactation Consultation Note  Patient Name: Jennifer Leach ZOXWR'UToday's Date: 02/19/2017 Reason for consult: Initial assessment   Initial assessment at 7 hrs of life.  Mom is a P4.  GA 37.5; BW 8 lbs, 3.2 oz. Infant breastfed at birth x1 (20 min) + formula via bottle x1 (15 ml); voids-0; stools-0.   [facility] how the feeding was at birth mom said, "It hurt. She killed it." Mom is adamant that she only breastfeeds once after the baby is born and then only want to formula feed.  This is what she did with her 3 previous babies.  Mom does not want to breastfeed anymore.    LC offered to teach her hand expression; mom declined.   LC offered to set her up with a DEBP or give her a HP for pumping milk out for the baby and mom again declined.   Lactation brochure given and informed patient of services offered. Encouraged mom to ask for latching assistance if she changes her milk and would like to latch the baby or be set up with a pump.   Mom does not have WIC "yet".    Maternal Data Has patient been taught Hand Expression?: No (does not want to be taught how to HE)  Feeding    LATCH Score/Interventions                      Lactation Tools Discussed/Used WIC Program: No   Consult Status Consult Status: Complete    Jennifer Leach, Jennifer Leach 02/19/2017, 10:10 PM

## 2017-02-19 NOTE — Anesthesia Procedure Notes (Signed)
Epidural Patient location during procedure: OB Start time: 02/19/2017 11:16 AM End time: 02/19/2017 11:24 AM  Staffing Anesthesiologist: Chaney MallingHODIERNE, Zandon Talton Performed: anesthesiologist   Preanesthetic Checklist Completed: patient identified, site marked, pre-op evaluation, timeout performed, IV checked, risks and benefits discussed and monitors and equipment checked  Epidural Patient position: sitting Prep: DuraPrep Patient monitoring: heart rate, cardiac monitor, continuous pulse ox and blood pressure Approach: midline Location: L2-L3 Injection technique: LOR saline  Needle:  Needle type: Tuohy  Needle gauge: 17 G Needle length: 9 cm Needle insertion depth: 4 cm Catheter type: closed end flexible Catheter size: 19 Gauge Catheter at skin depth: 9 cm Test dose: negative and Other  Assessment Events: blood not aspirated, injection not painful, no injection resistance and negative IV test  Additional Notes Informed consent obtained prior to proceeding including risk of failure, 1% risk of PDPH, risk of minor discomfort and bruising.  Discussed rare but serious complications including epidural abscess, permanent nerve injury, epidural hematoma.  Discussed alternatives to epidural analgesia and patient desires to proceed.  Timeout performed pre-procedure verifying patient name, procedure, and platelet count.  Patient tolerated procedure well. Reason for block:procedure for pain

## 2017-02-19 NOTE — Anesthesia Preprocedure Evaluation (Signed)
Anesthesia Evaluation  Patient identified by MRN, date of birth, ID band Patient awake    Reviewed: Allergy & Precautions, H&P , NPO status , Patient's Chart, lab work & pertinent test results  Airway Mallampati: II   Neck ROM: full    Dental   Pulmonary former smoker,    breath sounds clear to auscultation       Cardiovascular negative cardio ROS   Rhythm:regular Rate:Normal     Neuro/Psych    GI/Hepatic   Endo/Other    Renal/GU      Musculoskeletal   Abdominal   Peds  Hematology   Anesthesia Other Findings   Reproductive/Obstetrics (+) Pregnancy                             Anesthesia Physical Anesthesia Plan  ASA: I  Anesthesia Plan: Epidural   Post-op Pain Management:    Induction: Intravenous  Airway Management Planned: Natural Airway  Additional Equipment:   Intra-op Plan:   Post-operative Plan:   Informed Consent: I have reviewed the patients History and Physical, chart, labs and discussed the procedure including the risks, benefits and alternatives for the proposed anesthesia with the patient or authorized representative who has indicated his/her understanding and acceptance.     Plan Discussed with: CRNA, Anesthesiologist and Surgeon  Anesthesia Plan Comments:         Anesthesia Quick Evaluation

## 2017-02-19 NOTE — Anesthesia Preprocedure Evaluation (Signed)
Anesthesia Evaluation  Patient identified by MRN, date of birth, ID band Patient awake    Reviewed: Allergy & Precautions, H&P , NPO status , Patient's Chart, lab work & pertinent test results  Airway Mallampati: II   Neck ROM: full    Dental   Pulmonary former smoker,    breath sounds clear to auscultation       Cardiovascular negative cardio ROS   Rhythm:regular Rate:Normal     Neuro/Psych    GI/Hepatic   Endo/Other    Renal/GU      Musculoskeletal   Abdominal   Peds  Hematology   Anesthesia Other Findings   Reproductive/Obstetrics                             Anesthesia Physical Anesthesia Plan  ASA: II  Anesthesia Plan: Epidural   Post-op Pain Management:    Induction: Intravenous  Airway Management Planned: Simple Face Mask  Additional Equipment:   Intra-op Plan:   Post-operative Plan:   Informed Consent: I have reviewed the patients History and Physical, chart, labs and discussed the procedure including the risks, benefits and alternatives for the proposed anesthesia with the patient or authorized representative who has indicated his/her understanding and acceptance.     Plan Discussed with: CRNA, Anesthesiologist and Surgeon  Anesthesia Plan Comments:         Anesthesia Quick Evaluation

## 2017-02-19 NOTE — Transfer of Care (Signed)
Immediate Anesthesia Transfer of Care Note  Patient: Jennifer Leach  Procedure(s) Performed: Procedure(s): POST PARTUM TUBAL LIGATION (N/A)  Patient Location: PACU  Anesthesia Type:Epidural  Level of Consciousness: awake  Airway & Oxygen Therapy: Patient Spontanous Breathing  Post-op Assessment: Report given to RN  Post vital signs: Reviewed and stable  Last Vitals:  Vitals:   02/19/17 1530 02/19/17 1602  BP: (!) 127/100 109/62  Pulse: 62 (!) 57  Resp: 18   Temp: 36.3 C     Last Pain:  Vitals:   02/19/17 1602  TempSrc:   PainSc: 0-No pain      Patients Stated Pain Goal: 3 (02/19/17 1330)  Complications: No apparent anesthesia complications

## 2017-02-19 NOTE — Progress Notes (Signed)
Patient ID: Jennifer Leach, female   DOB: September 24, 1981, 36 y.o.   MRN: [id number]  S: Patient seen & examined for progress of labor. Patient comfortable, sleeping.    O:  Vitals:   02/19/17 0400 02/19/17 0430 02/19/17 0539 02/19/17 0600  BP: (!) 106/55 (!) 99/57 110/60 (!) 103/56  Pulse: 64 84 63 79  Resp: 18 18 20 18   Temp: 98.5 F (36.9 C)     TempSrc: Oral     Weight:      Height:        Dilation: 3 Effacement (%): 70, 80 Station: -1 Presentation: Vertex Exam by:: Dr. Omer JackMumaw   FHT: 120 bpm, mod var, +accels, no decels TOCO: q564min   A/P: Continue increasing pitocin per protocol Continue expectant management Anticipate SVD

## 2017-02-19 NOTE — Progress Notes (Signed)
LABOR PROGRESS NOTE  Posey Reangela L Lull is a 36 y.o. G4P3003 at 4860w5d  admitted for SOL s/p SROM.  Subjective: Patient is feeling more pressure in her pelvis, and is feeling more comfortable with epidural.   Objective: BP (!) 100/52   Pulse 64   Temp 98.3 F (36.8 C) (Oral)   Resp 20   Ht 5\' 7"  (1.702 m)   Wt 178 lb (80.7 kg)   LMP 05/26/2016 (Approximate)   SpO2 100%   BMI 27.88 kg/m  or  Vitals:   02/19/17 1229 02/19/17 1230 02/19/17 1234 02/19/17 1235  BP:  (!) 92/49  (!) 100/52  Pulse: 72 68 71 64  Resp:  20    Temp:      TempSrc:      SpO2:  99%  100%  Weight:      Height:         Dilation: 5.5 Effacement (%): 90 Station: -2 Presentation: Vertex Exam by:: Catie Parker HannifinSullivan RN  Labs: Lab Results  Component Value Date   WBC 8.9 02/18/2017   HGB 10.8 (L) 02/18/2017   HCT 31.9 (L) 02/18/2017   MCV 89.1 02/18/2017   PLT 133 (L) 02/18/2017    Patient Active Problem List   Diagnosis Date Noted  . Normal labor 02/18/2017  . Premature cervical dilation in third trimester 01/12/2017  . Back pain affecting pregnancy 10/17/2016  . Supervision of other normal pregnancy, antepartum 07/25/2016    Assessment / Plan: 36 y.o. G4P3003 at 6060w5d here for SOL s/p SROM. Patient is progressing adequately. Will continue to monitor.  Labor: Continue Pitocin, currently on 14 Fetal Wellbeing:  Cat 1  Pain Control:  On epidural Anticipated MOD:  Vaginal  Lovena NeighboursAbdoulaye Bogdan Vivona, MD 02/19/2017, 1:15 PM

## 2017-02-19 NOTE — Progress Notes (Signed)
Patient seen. Doing well this morning. Feeling small contractions/crmaping, but not uncomfortable. Has been on pitocin since 1AM. On 16 units. Continue to increase. Category 1 tracing. Expect SVD.  Ernestina PennaNicholas Schenk, MD 02/19/17 8:55 AM

## 2017-02-19 NOTE — H&P (Signed)
LABOR AND DELIVERY ADMISSION HISTORY AND PHYSICAL NOTE  Jennifer Leach is a 36 y.o. female [phone] with IUP at [redacted]w[redacted]d by 6wk [place] presenting for SROM/early labor.  Patient states she has been contracting for 2 days, but noticed at about 8pm she was constantly leaking fluid. She reports positive fetal movement. She denies vaginal bleeding.  Prenatal History/Complications:  Past Medical History: Past Medical History:  Diagnosis Date  . Gonorrhea   . MRSA (methicillin resistant staph aureus) culture positive 08/06/2014    Past Surgical History: Past Surgical History:  Procedure Laterality Date  . NO PAST SURGERIES    . WISDOM TOOTH EXTRACTION      Obstetrical History: OB History    Gravida Para Term Preterm AB Living   4 3 3    0 3   SAB TAB Ectopic Multiple Live Births     0     3      Social History: Social History   Social History  . Marital status: Single    Spouse name: N/A  . Number of children: N/A  . Years of education: N/A   Social History Main Topics  . Smoking status: Former Smoker    Packs/day: 0.25    Years: 9.00    Types: Cigarettes    Quit date: 06/09/2012  . Smokeless tobacco: Never Used  . Alcohol use No  . Drug use: No  . Sexual activity: Yes   Other Topics Concern  . None   Social History Narrative  . None    Family History: Family History  Problem Relation Age of Onset  . Hypertension Mother     Allergies: Allergies  Allergen Reactions  . Septra Ds [Sulfamethoxazole-Trimethoprim] Anaphylaxis, Swelling, Rash and Other (See Comments)    Reaction:  Facial swelling     Prescriptions Prior to Admission  Medication Sig Dispense Refill Last Dose  . acetaminophen (TYLENOL) 325 MG tablet Take 650 mg by mouth every 6 (six) hours as needed for mild pain.    Past Month at Unknown time  . cyclobenzaprine (FLEXERIL) 10 MG tablet Take 10 mg by mouth 3 (three) times daily as needed.   1 Past Month at Unknown time  . omeprazole (PRILOSEC) 20 MG  capsule Take 1 capsule by mouth daily as needed (heartburn).   5 02/14/2017 at Unknown time     Review of Systems   All systems reviewed and negative except as stated in HPI  Physical Exam Blood pressure 140/89, pulse 89, temperature 98.7 F (37.1 C), temperature source Oral, resp. rate 18, height 5\' 7"  (1.702 m), weight 178 lb (80.7 kg), last menstrual period 05/26/2016. General appearance: alert, cooperative, appears stated age and no distress Lungs: clear to auscultation bilaterally Heart: regular rate and rhythm Abdomen: soft, non-tender; bowel sounds normal Extremities: No calf swelling or tenderness Presentation: cephalic Fetal monitoring: 125 bpm, mod var, +accels, no decels Uterine activity: Irregular Dilation: 2 Effacement (%): 80 Station: -2 Exam by:: Dr. Omer Jack   Prenatal labs: ABO, Rh: --/--/A POS (05/13 2302) Antibody: NEG (05/13 2302) Rubella: !Error! IMMUNE RPR: Non Reactive (03/09 1100)  HBsAg: Negative (10/17 1614)  HIV: Non Reactive (03/09 1100)  GBS: Negative (04/26 1545)  2 hr Glucola: 78/100/95, normal Genetic screening:  Normal Anatomy US: Normal, female  Prenatal Transfer Tool  Maternal Diabetes: No Genetic Screening: Normal Maternal Ultrasounds/Referrals: Normal Fetal Ultrasounds or other Referrals:  None Maternal Substance Abuse:  No Significant Maternal Medications:  None Significant Maternal Lab Results: Lab values include: Group  B Strep negative  Results for orders placed or performed during the hospital encounter of 02/18/17 (from the past 24 hour(s))  Fern Test   Collection Time: 02/18/17 10:30 PM  Result Value Ref Range   POCT Fern Test Positive = ruptured amniotic membanes   CBC   Collection Time: 02/18/17 11:02 PM  Result Value Ref Range   WBC 8.9 4.0 - 10.5 K/uL   RBC 3.58 (L) 3.87 - 5.11 MIL/uL   Hemoglobin 10.8 (L) 12.0 - 15.0 g/dL   HCT 16.131.9 (L) 09.636.0 - 04.546.0 %   MCV 89.1 78.0 - 100.0 fL   MCH 30.2 26.0 - 34.0 pg   MCHC 33.9  30.0 - 36.0 g/dL   RDW 40.914.0 81.111.5 - 91.415.5 %   Platelets 133 (L) 150 - 400 K/uL  Type and screen [facility]   Collection Time: 02/18/17 11:02 PM  Result Value Ref Range   ABO/RH(D) A POS    Antibody Screen NEG    Sample Expiration 02/21/2017   MRSA PCR Screening   Collection Time: 02/19/17 12:04 AM  Result Value Ref Range   MRSA by PCR NEGATIVE NEGATIVE    Patient Active Problem List   Diagnosis Date Noted  . Normal labor 02/18/2017  . Premature cervical dilation in third trimester 01/12/2017  . Back pain affecting pregnancy 10/17/2016  . Supervision of other normal pregnancy, antepartum 07/25/2016    Assessment: Jennifer Leach is a 36 y.o. G4P3003 at 1552w5d here for SROM/SOL.  #Labor:Augmenting labor with pitocin, SROM.  #Pain: IV pain meds prn, epidural on request #FWB: Cat I #ID:  GBS [id number] #MOF: Both breast and bottle #MOC:Desires BTL (consent signed 12/15/16) #Circ:  N/A- girl  Jen MowElizabeth Titianna Loomis, DO OB Fellow [facility] 02/19/2017, 1:37 AM

## 2017-02-19 NOTE — Op Note (Signed)
NAME@ 02/18/2017 - 02/19/2017  PREOPERATIVE DIAGNOSIS:  Undesired fertility  POSTOPERATIVE DIAGNOSIS:  Undesired fertility  PROCEDURE:  Postpartum Bilateral Tubal Sterilization using Pomeroy method   ANESTHESIA:  Epidural  COMPLICATIONS:  None immediate.  ESTIMATED BLOOD LOSS:  Less than 20cc.  INDICATIONS: 36 y.o. yo [id number]  with undesired fertility,status post vaginal delivery, desires permanent sterilization. Risks and benefits of procedure discussed with patient including permanence of method, bleeding, infection, injury to surrounding organs and need for additional procedures. Risk failure of 0.5-1% with increased risk of ectopic gestation if pregnancy occurs was also discussed with patient.   FINDINGS:  Normal uterus, tubes, and ovaries.  TECHNIQUE: After informed consent was obtained, the patient was taken to the operating room where anesthesia was induced and found to be adequate. A small transverse, infraumbilical skin incision was made with the scalpel. This incision was carried down to the underlying layer of fascia. The fascia was grasped with hemostats tented up and entered sharply with Mayo scissors. Underlying peritoneum was then identified and entered bluntly The patient's left fallopian tube was then identified, brought to the incision, and grasped with a Babcock clamp. The tube was then followed out to the fimbria. The Babcock clamp was then used to grasp the tube approximately 4 cm from the cornual region. Filsche clamp was placed. The right fallopian tube was then identified to its fimbriated end, ligated, and a 3 cm segment excised in a similar fashion. Excellent hemostasis was noted, and the tube returned to the abdomen. The fascia was re-approximated with 0 Vicryl. The skin was closed in a subcuticular fashion with 3-0 Vicryl. Quarter percent Marcaine solution was then injected at the incision site. The patient tolerated the procedure well. Sponge, lap, and needle count were  correct x2. The patient was taken to recovery room in stable condition.  Ernestina PennaNicholas Schenk, MD 02/19/17 5:09 PM

## 2017-02-20 ENCOUNTER — Encounter (HOSPITAL_COMMUNITY): Payer: Self-pay | Admitting: Obstetrics and Gynecology

## 2017-02-20 MED ORDER — OXYCODONE-ACETAMINOPHEN 5-325 MG PO TABS
2.0000 | ORAL_TABLET | ORAL | Status: DC | PRN
  Filled 2017-02-20: qty 2

## 2017-02-20 MED ORDER — FERROUS SULFATE 325 (65 FE) MG PO TABS
325.0000 mg | ORAL_TABLET | Freq: Three times a day (TID) | ORAL | Status: DC
  Administered 2017-02-20 – 2017-02-21 (×5): 325 mg via ORAL
  Filled 2017-02-20 (×6): qty 1

## 2017-02-20 NOTE — Progress Notes (Signed)
Post Partum Day 2, Post op BTL Day 1 Subjective: up ad lib, voiding, tolerating PO and complain lower abd pain, has pain meds ordered.   Objective: Blood pressure (!) 104/56, pulse 65, temperature 98.2 F (36.8 C), temperature source Oral, resp. rate 16, height 5\' 7"  (1.702 m), weight 178 lb (80.7 kg), last menstrual period 05/26/2016, SpO2 100 %, currently breast and bottle feeding.   Physical Exam:  General: alert, cooperative, appears stated age and no distress Lochia: appropriate Uterine Fundus: firm Incision:  2nd degree perineal healing well, tubal op sIte honeycomb dressing C/D/I, no signs of bleeding noted. DVT Evaluation: No evidence of DVT seen on physical exam.  DVT Evaluation: No evidence of DVT seen on physical exam. Calf/Ankle edema is present. + 1 pedal edema.    Recent Labs  02/18/17 2302  HGB 10.8*  HCT 31.9*    Assessment/Plan: Plan for discharge tomorrow, Breastfeeding and Contraception post op btl 02/20/2017 , lactation seen yesterday,patient is breast and bottle feeding.   LOS: 2 days   Rondel JumboShanika Mahogany Torrance, SNM 02/20/2017, 7:56 AM

## 2017-02-20 NOTE — Anesthesia Postprocedure Evaluation (Signed)
Anesthesia Post Note  Patient: Posey Reangela L Boze  Procedure(s) Performed: * No procedures listed *  Patient location during evaluation: Mother Baby Anesthesia Type: Epidural Level of consciousness: awake and alert and oriented Pain management: pain level controlled Vital Signs Assessment: post-procedure vital signs reviewed and stable Respiratory status: spontaneous breathing and nonlabored ventilation Cardiovascular status: stable Postop Assessment: no headache, epidural receding, patient able to bend at knees, no backache and no signs of nausea or vomiting Anesthetic complications: no Comments: Pt NPO for a scheduled PPTL.        Last Vitals:  Vitals:   02/20/17 0350 02/20/17 0547  BP: 124/70 (!) 104/56  Pulse: 70 65  Resp: 18 16  Temp: 36.8 C 36.8 C    Last Pain:  Vitals:   02/20/17 0547  TempSrc: Oral  PainSc:    Pain Goal: Patients Stated Pain Goal: 2 (02/19/17 1958)               Laban EmperorMalinova,Reena Borromeo Hristova

## 2017-02-21 MED ORDER — OXYCODONE-ACETAMINOPHEN 5-325 MG PO TABS: 1.0000 | ORAL_TABLET | ORAL | 0 refills | Status: DC | PRN

## 2017-02-21 MED ORDER — SENNOSIDES-DOCUSATE SODIUM 8.6-50 MG PO TABS: 2.0000 | ORAL_TABLET | Freq: Every day | ORAL | 2 refills | Status: DC

## 2017-02-21 MED ORDER — IBUPROFEN 600 MG PO TABS: 600.0000 mg | ORAL_TABLET | Freq: Four times a day (QID) | ORAL | 2 refills | Status: DC

## 2017-02-21 NOTE — Progress Notes (Signed)
Post Partum Day #2/POD #2 Subjective: no complaints, up ad lib, voiding and tolerating PO  Objective: Blood pressure 116/75, pulse 68, temperature 97.7 F (36.5 C), temperature source Oral, resp. rate 18, height 5\' 7"  (1.702 m), weight 178 lb (80.7 kg), last menstrual period 05/26/2016, SpO2 100 %, unknown if currently breastfeeding.  Physical Exam:  General: alert, cooperative and no distress Lochia: appropriate Uterine Fundus: firm Incision: no significant drainage, no dehiscence, no significant erythema @Umbilicus  DVT Evaluation: No evidence of DVT seen on physical exam. No cords or calf tenderness. No significant calf/ankle edema.   Recent Labs  02/18/17 2302  HGB 10.8*  HCT 31.9*    Assessment/Plan: Discharge home, Breastfeeding and Contraception BTL completed 02/19/17   LOS: 3 days   Gladies Sofranko A Vearl Allbaugh 02/21/2017, 7:22 AM

## 2017-02-21 NOTE — Discharge Summary (Signed)
OB Discharge Summary     Patient Name: Jennifer Leach DOB: February 04, 1981 MRN: [id number]  Date of admission: 02/18/2017 Delivering MD: Lovena NeighboursIALLO, ABDOULAYE   Date of discharge: 02/21/2017  Admitting diagnosis: 37WKS LEAKING FLUID Post Partum Tubal Ligation  Intrauterine pregnancy: 8922w5d     Secondary diagnosis:  Active Problems:   Normal labor  Additional problems: preterm labor had BMZ X2 4/9-4/10     Discharge diagnosis: Term Pregnancy Delivered and s/p BTL 02/19/17                                                                                                Post partum procedures:none  Augmentation: Pitocin  Complications: None  Hospital course:  Onset of Labor With Vaginal Delivery     36 y.o. yo [id number] at 5722w5d was admitted in Latent Labor on 02/18/2017. Patient had an uncomplicated labor course as follows:  Membrane Rupture Time/Date: 9:00 PM ,02/18/2017   Intrapartum Procedures: Episiotomy: None [1]                                         Lacerations:  None [1]  Patient had a delivery of a Viable infant. 02/19/2017  Information for the patient's newborn:  Tilda FrancoRowland, Girl Bertie [[id number]][[id number]]  Delivery Method: Vaginal, Spontaneous Delivery (Filed from Delivery Summary)    Pateint had an uncomplicated postpartum course.  She is ambulating, tolerating a regular diet, passing flatus, and urinating well. Patient is discharged home in stable condition on 02/21/17.   Physical exam  Vitals:   02/20/17 0810 02/20/17 1230 02/20/17 1753 02/21/17 0511  BP: 117/72 114/67 104/65 116/75  Pulse: 72 (!) 59 (!) 56 68  Resp: 16 18 17 18   Temp: 98.4 F (36.9 C) 97.5 F (36.4 C) 97.5 F (36.4 C) 97.7 F (36.5 C)  TempSrc: Oral Oral Oral Oral  SpO2: 99%  100%   Weight:      Height:       General: alert, cooperative and no distress Lochia: appropriate Uterine Fundus: firm Incision: Dressing is clean, dry, and intact DVT Evaluation: No evidence of DVT seen on physical exam. No  cords or calf tenderness. No significant calf/ankle edema. Labs: Lab Results  Component Value Date   WBC 8.9 02/18/2017   HGB 10.8 (L) 02/18/2017   HCT 31.9 (L) 02/18/2017   MCV 89.1 02/18/2017   PLT 133 (L) 02/18/2017   CMP Latest Ref Rng & Units 02/15/2017  Glucose 65 - 99 mg/dL 83  BUN 6 - 20 mg/dL <9(G<5(L)  Creatinine 2.950.44 - 1.00 mg/dL 2.840.64  Sodium 132135 - 440145 mmol/L 136  Potassium 3.5 - 5.1 mmol/L 3.3(L)  Chloride 101 - 111 mmol/L 105  CO2 22 - 32 mmol/L 23  Calcium 8.9 - 10.3 mg/dL 8.3(L)  Total Protein 6.5 - 8.1 g/dL 6.2(L)  Total Bilirubin 0.3 - 1.2 mg/dL 1.0(U1.3(H)  Alkaline Phos 38 - 126 U/L 148(H)  AST 15 - 41 U/L 22  ALT 14 - 54 U/L 9(L)  Discharge instruction: per After Visit Summary and "Baby and Me Booklet".  After visit meds:  Allergies as of 02/21/2017      Reactions   Septra Ds [sulfamethoxazole-trimethoprim] Anaphylaxis, Swelling, Rash, Other (See Comments)   Reaction:  Facial swelling       Medication List    STOP taking these medications   omeprazole 20 MG capsule Commonly known as:  PRILOSEC     TAKE these medications   acetaminophen 325 MG tablet Commonly known as:  TYLENOL Take 650 mg by mouth every 6 (six) hours as needed for mild pain.   cyclobenzaprine 10 MG tablet Commonly known as:  FLEXERIL Take 10 mg by mouth 3 (three) times daily as needed for muscle spasms.   ibuprofen 600 MG tablet Commonly known as:  ADVIL,MOTRIN Take 1 tablet (600 mg total) by mouth every 6 (six) hours.   oxyCODONE-acetaminophen 5-325 MG tablet Commonly known as:  PERCOCET/ROXICET Take 1 tablet by mouth every 4 (four) hours as needed for severe pain.   senna-docusate 8.6-50 MG tablet Commonly known as:  Senokot-S Take 2 tablets by mouth at bedtime.       Diet: routine diet  Activity: Advance as tolerated. Pelvic rest for 6 weeks.   Outpatient follow up:6 weeks Follow up Appt:No future appointments. Follow up Visit:No Follow-up on file.  Postpartum  contraception: Tubal Ligation  Newborn Data: Live born female  Birth Weight: 8 lb 3.2 oz (3720 g) APGAR: 9, 9  Baby Feeding: Bottle and Breast Disposition:home with mother   02/21/2017 Roe Coombs, CNM

## 2017-02-21 NOTE — Progress Notes (Signed)
Mom has been bottle feeding and declines speaking with lactation before discharge.  I went over care of breast if milk comes in and how to change over to pumping and feeding breast milk by bottle if she decides to do that.

## 2017-02-21 NOTE — Anesthesia Postprocedure Evaluation (Signed)
Anesthesia Post Note  Patient: Jennifer Leach  Procedure(s) Performed: Procedure(s) (LRB): POST PARTUM TUBAL LIGATION (N/A)  Patient location during evaluation: PACU Anesthesia Type: Epidural Level of consciousness: oriented and awake and alert Pain management: pain level controlled Vital Signs Assessment: post-procedure vital signs reviewed and stable Respiratory status: spontaneous breathing, respiratory function stable and patient connected to nasal cannula oxygen Cardiovascular status: blood pressure returned to baseline and stable Postop Assessment: no headache and no backache Anesthetic complications: no       Last Vitals:  Vitals:   02/20/17 1753 02/21/17 0511  BP: 104/65 116/75  Pulse: (!) 56 68  Resp: 17 18  Temp: 36.4 C 36.5 C    Last Pain:  Vitals:   02/21/17 0511  TempSrc: Oral  PainSc:                  Taylia Berber S

## 2017-02-22 ENCOUNTER — Encounter: Payer: Medicaid Other | Admitting: Certified Nurse Midwife

## 2017-02-28 ENCOUNTER — Encounter (HOSPITAL_COMMUNITY): Payer: Self-pay

## 2017-03-01 ENCOUNTER — Encounter: Payer: Medicaid Other | Admitting: Certified Nurse Midwife

## 2017-03-04 ENCOUNTER — Inpatient Hospital Stay (HOSPITAL_COMMUNITY)
Admission: AD | Admit: 2017-03-04 | Discharge: 2017-03-04 | Disposition: A | Discharge status: Home / Self Care | Payer: Medicaid Other | Source: Ambulatory Visit | Attending: Obstetrics and Gynecology | Admitting: Obstetrics and Gynecology

## 2017-03-04 ENCOUNTER — Encounter (HOSPITAL_COMMUNITY): Payer: Self-pay | Admitting: *Deleted

## 2017-03-04 DIAGNOSIS — Z9889 Other specified postprocedural states: Secondary | ICD-10-CM | POA: Diagnosis not present

## 2017-03-04 DIAGNOSIS — Z87891 Personal history of nicotine dependence: Secondary | ICD-10-CM | POA: Diagnosis not present

## 2017-03-04 DIAGNOSIS — N9489 Other specified conditions associated with female genital organs and menstrual cycle: Secondary | ICD-10-CM | POA: Diagnosis not present

## 2017-03-04 DIAGNOSIS — B9689 Other specified bacterial agents as the cause of diseases classified elsewhere: Secondary | ICD-10-CM | POA: Diagnosis not present

## 2017-03-04 DIAGNOSIS — Z9851 Tubal ligation status: Secondary | ICD-10-CM | POA: Diagnosis not present

## 2017-03-04 DIAGNOSIS — N760 Acute vaginitis: Secondary | ICD-10-CM

## 2017-03-04 DIAGNOSIS — N939 Abnormal uterine and vaginal bleeding, unspecified: Secondary | ICD-10-CM | POA: Diagnosis not present

## 2017-03-04 DIAGNOSIS — N898 Other specified noninflammatory disorders of vagina: Secondary | ICD-10-CM | POA: Diagnosis present

## 2017-03-04 HISTORY — DX: Other specified bacterial agents as the cause of diseases classified elsewhere: B96.89

## 2017-03-04 HISTORY — DX: Other specified conditions associated with female genital organs and menstrual cycle: N94.89

## 2017-03-04 HISTORY — DX: Acute vaginitis: N76.0

## 2017-03-04 LAB — WET PREP, GENITAL
Sperm: NONE SEEN
Trich, Wet Prep: NONE SEEN
Yeast Wet Prep HPF POC: NONE SEEN

## 2017-03-04 LAB — COMPREHENSIVE METABOLIC PANEL
ALT: 22 U/L (ref 14–54)
AST: 24 U/L (ref 15–41)
Albumin: 3.2 g/dL — ABNORMAL LOW (ref 3.5–5.0)
Alkaline Phosphatase: 101 U/L (ref 38–126)
Anion gap: 6 (ref 5–15)
BUN: 15 mg/dL (ref 6–20)
CHLORIDE: 107 mmol/L (ref 101–111)
CO2: 25 mmol/L (ref 22–32)
CREATININE: 0.7 mg/dL (ref 0.44–1.00)
Calcium: 8.4 mg/dL — ABNORMAL LOW (ref 8.9–10.3)
GFR calc Af Amer: >60 mL/min (ref >60)
Glucose, Bld: 102 mg/dL — ABNORMAL HIGH (ref 65–99)
Potassium: 3.5 mmol/L (ref 3.5–5.1)
Sodium: 138 mmol/L (ref 135–145)
Total Bilirubin: 1 mg/dL (ref 0.3–1.2)
Total Protein: 6.5 g/dL (ref 6.5–8.1)

## 2017-03-04 LAB — CBC
HEMATOCRIT: 36.8 % (ref 36.0–46.0)
Hemoglobin: 12.2 g/dL (ref 12.0–15.0)
MCH: 29.8 pg (ref 26.0–34.0)
MCHC: 33.2 g/dL (ref 30.0–36.0)
MCV: 90 fL (ref 78.0–100.0)
PLATELETS: 262 10*3/uL (ref 150–400)
RBC: 4.09 MIL/uL (ref 3.87–5.11)
RDW: 13.8 % (ref 11.5–15.5)
WBC: 6.3 10*3/uL (ref 4.0–10.5)

## 2017-03-04 LAB — PROTEIN / CREATININE RATIO, URINE
CREATININE, URINE: 63 mg/dL
Protein Creatinine Ratio: 0.16 mg/mg{Cre} — ABNORMAL HIGH (ref 0.00–0.15)
Total Protein, Urine: 10 mg/dL

## 2017-03-04 MED ORDER — METRONIDAZOLE 500 MG PO TABS
500.0000 mg | ORAL_TABLET | Freq: Two times a day (BID) | ORAL | 0 refills | Status: AC
Start: 2017-03-04 — End: 2017-03-11

## 2017-03-04 NOTE — Discharge Instructions (Signed)
Someone will call you, if your lab results are abnormal and/or if we will need you to return for evaluation.  You can apply cold compresses to your vaginal area for comfort.  You can also take Ibuprofen to help decrease the swelling in your vaginal area.  Bacterial Vaginosis Bacterial vaginosis is a vaginal infection that occurs when the normal balance of bacteria in the vagina is disrupted. It results from an overgrowth of certain bacteria. This is the most common vaginal infection among women ages 42-44. Because bacterial vaginosis increases your risk for STIs (sexually transmitted infections), getting treated can help reduce your risk for chlamydia, gonorrhea, herpes, and HIV (human immunodeficiency virus). Treatment is also important for preventing complications in pregnant women, because this condition can cause an early (premature) delivery. What are the causes? This condition is caused by an increase in harmful bacteria that are normally present in small amounts in the vagina. However, the reason that the condition develops is not fully understood. What increases the risk? The following factors may make you more likely to develop this condition:  Having a new sexual partner or multiple sexual partners.  Having unprotected sex.  Douching.  Having an intrauterine device (IUD).  Smoking.  Drug and alcohol abuse.  Taking certain antibiotic medicines.  Being pregnant. You cannot get bacterial vaginosis from toilet seats, bedding, swimming pools, or contact with objects around you. What are the signs or symptoms? Symptoms of this condition include:  Grey or white vaginal discharge. The discharge can also be watery or foamy.  A fish-like odor with discharge, especially after sexual intercourse or during menstruation.  Itching in and around the vagina.  Burning or pain with urination. Some women with bacterial vaginosis have no signs or symptoms. How is this diagnosed? This  condition is diagnosed based on:  Your medical history.  A physical exam of the vagina.  Testing a sample of vaginal fluid under a microscope to look for a large amount of bad bacteria or abnormal cells. Your health care provider may use a cotton swab or a small wooden spatula to collect the sample. How is this treated? This condition is treated with antibiotics. These may be given as a pill, a vaginal cream, or a medicine that is put into the vagina (suppository). If the condition comes back after treatment, a second round of antibiotics may be needed. Follow these instructions at home: Medicines   Take over-the-counter and prescription medicines only as told by your health care provider.  Take or use your antibiotic as told by your health care provider. Do not stop taking or using the antibiotic even if you start to feel better. General instructions   If you have a female sexual partner, tell her that you have a vaginal infection. She should see her health care provider and be treated if she has symptoms. If you have a female sexual partner, he does not need treatment.  During treatment:  Avoid sexual activity until you finish treatment.  Do not douche.  Avoid alcohol as directed by your health care provider.  Avoid breastfeeding as directed by your health care provider.  Drink enough water and fluids to keep your urine clear or pale yellow.  Keep the area around your vagina and rectum clean.  Wash the area daily with warm water.  Wipe yourself from front to back after using the toilet.  Keep all follow-up visits as told by your health care provider. This is important. How is this prevented?  Do  not douche.  Wash the outside of your vagina with warm water only.  Use protection when having sex. This includes latex condoms and dental dams.  Limit how many sexual partners you have. To help prevent bacterial vaginosis, it is best to have sex with just one partner  (monogamous).  Make sure you and your sexual partner are tested for STIs.  Wear cotton or cotton-lined underwear.  Avoid wearing tight pants and pantyhose, especially during summer.  Limit the amount of alcohol that you drink.  Do not use any products that contain nicotine or tobacco, such as cigarettes and e-cigarettes. If you need help quitting, ask your health care provider.  Do not use illegal drugs. Where to find more information:  Centers for Disease Control and Prevention: SolutionApps.co.zawww.cdc.gov/std  American Sexual Health Association (ASHA): www.ashastd.org  U.S. Department of Health and Health and safety inspectorHuman Services, Office on Women's Health: ConventionalMedicines.siwww.womenshealth.gov/ or http://www.anderson-williamson.info/https://www.womenshealth.gov/a-z-topics/bacterial-vaginosis Contact a health care provider if:  Your symptoms do not improve, even after treatment.  You have more discharge or pain when urinating.  You have a fever.  You have pain in your abdomen.  You have pain during sex.  You have vaginal bleeding between periods. Summary  Bacterial vaginosis is a vaginal infection that occurs when the normal balance of bacteria in the vagina is disrupted.  Because bacterial vaginosis increases your risk for STIs (sexually transmitted infections), getting treated can help reduce your risk for chlamydia, gonorrhea, herpes, and HIV (human immunodeficiency virus). Treatment is also important for preventing complications in pregnant women, because the condition can cause an early (premature) delivery.  This condition is treated with antibiotic medicines. These may be given as a pill, a vaginal cream, or a medicine that is put into the vagina (suppository). This information is not intended to replace advice given to you by your health care provider. Make sure you discuss any questions you have with your health care provider. Document Released: 09/25/2005 Document Revised: 06/10/2016 Document Reviewed: 06/10/2016 Elsevier Interactive Patient  Education  2017 [employer]

## 2017-03-04 NOTE — MAU Note (Addendum)
My vagina is swollen up like a cherry. Swelling started before left hospital and I thought it would go away. No d/c but is itching, burning, and swollen. SVD on 5/14 with no sutures needed and no tears. Had BTL

## 2017-03-04 NOTE — Progress Notes (Signed)
Blind swab to collect wet prep

## 2017-03-04 NOTE — MAU Provider Note (Addendum)
Subjective:  Patient ID: Jennifer Leach, female    DOB: 11-Jun-1981  Age: 36 y.o. MRN: [id number]  CC: swelling of vagina   HPI Jennifer Leach is 36 y.o. [id number] ppd# 13 who presents today with vaginal swelling. Patient reports that she has had vaginal itching and burning ever since she delivered on 5/14. She ignored it as she thought it would get better. However, it has worsened over the course of 13 days and on the morning of 5/26 noticed that her vagina was swollen. She has had constant bleeding ever since her delivery on 5/14. She reports going through 4 to 5 pads a day. She says she has had yeast infection before and that this feels similar to that, however she notes there has been no discharge like prior infections. Patient denies any sexual activity since delivery. Patient endorses some occasional dizziness and some abdominal pain from the site of her BTL. Patient denies any fever,  nausea or vomiting, or urinary symptoms.     Past Medical History:  Diagnosis Date  . Gonorrhea   . MRSA (methicillin resistant staph aureus) culture positive 08/06/2014    Past Surgical History:  Procedure Laterality Date  . NO PAST SURGERIES    . TUBAL LIGATION N/A 02/19/2017   Procedure: POST PARTUM TUBAL LIGATION;  Surgeon: Hermina StaggersErvin, Michael L, MD;  [facility];  Service: Gynecology;  Laterality: N/A;  . WISDOM TOOTH EXTRACTION     OB History    Gravida Para Term Preterm AB Living   4 4 4    0 4   SAB TAB Ectopic Multiple Live Births     0   0 4       Family History  Problem Relation Age of Onset  . Hypertension Mother     Social History  Substance Use Topics  . Smoking status: Former Smoker    Packs/day: 0.25    Years: 9.00    Types: Cigarettes    Quit date: 06/09/2012  . Smokeless tobacco: Never Used  . Alcohol use No    ROS Review of Systems  Constitutional: Positive for fatigue. Negative for fever.  Respiratory: Negative for shortness of breath.    Cardiovascular: Negative for chest pain and leg swelling.  Gastrointestinal: Positive for abdominal pain. Negative for nausea and vomiting.  Genitourinary: Positive for vaginal bleeding and vaginal pain. Negative for dysuria and vaginal discharge.    Objective:   Today's Vitals: BP 131/71 (BP Location: Right Arm)   Pulse (!) 55   Temp 98 F (36.7 C)   Resp 18   Ht 5\' 7"  (1.702 m)   Wt 74.8 kg (165 lb)   Breastfeeding? No   BMI 25.84 kg/m   Physical Exam  Constitutional: She appears well-developed and well-nourished.  HENT:  Head: Normocephalic and atraumatic.  Neck: Normal range of motion.  Cardiovascular: Normal rate and regular rhythm.  Exam reveals no gallop and no friction rub.   No murmur heard. Pulmonary/Chest: Effort normal and breath sounds normal. She has no wheezes. She has no rales.  Abdominal: Soft. Bowel sounds are normal.  Tenderness over incision site around the umbilicus  Genitourinary: There is erythema, tenderness and bleeding in the vagina.   Results for orders placed or performed during the hospital encounter of 03/04/17 (from the past 24 hour(s))  Wet prep, genital     Status: Abnormal   Collection Time: 03/04/17  3:30 AM  Result Value Ref Range   Yeast Wet Prep HPF POC  NONE SEEN NONE SEEN   Trich, Wet Prep NONE SEEN NONE SEEN   Clue Cells Wet Prep HPF POC PRESENT (A) NONE SEEN   WBC, Wet Prep HPF POC MODERATE (A) NONE SEEN   Sperm NONE SEEN   CBC     Status: None   Collection Time: 03/04/17  4:04 AM  Result Value Ref Range   WBC 6.3 4.0 - 10.5 K/uL   RBC 4.09 3.87 - 5.11 MIL/uL   Hemoglobin 12.2 12.0 - 15.0 g/dL   HCT 16.1 09.6 - 04.5 %   MCV 90.0 78.0 - 100.0 fL   MCH 29.8 26.0 - 34.0 pg   MCHC 33.2 30.0 - 36.0 g/dL   RDW 40.9 81.1 - 91.4 %   Platelets 262 150 - 400 K/uL  Comprehensive metabolic panel     Status: Abnormal   Collection Time: 03/04/17  4:04 AM  Result Value Ref Range   Sodium 138 135 - 145 mmol/L   Potassium 3.5 3.5 - 5.1  mmol/L   Chloride 107 101 - 111 mmol/L   CO2 25 22 - 32 mmol/L   Glucose, Bld 102 (H) 65 - 99 mg/dL   BUN 15 6 - 20 mg/dL   Creatinine, Ser 7.82 0.44 - 1.00 mg/dL   Calcium 8.4 (L) 8.9 - 10.3 mg/dL   Total Protein 6.5 6.5 - 8.1 g/dL   Albumin 3.2 (L) 3.5 - 5.0 g/dL   AST 24 15 - 41 U/L   ALT 22 14 - 54 U/L   Alkaline Phosphatase 101 38 - 126 U/L   Total Bilirubin 1.0 0.3 - 1.2 mg/dL   GFR calc non Af Amer >60 >60 mL/min   GFR calc Af Amer >60 >60 mL/min   Anion gap 6 5 - 15  Protein / creatinine ratio, urine     Status: Abnormal   Collection Time: 03/04/17  4:15 AM  Result Value Ref Range   Creatinine, Urine 63.00 mg/dL   Total Protein, Urine 10 mg/dL   Protein Creatinine Ratio 0.16 (H) 0.00 - 0.15 mg/mg[Cre]     Assessment & Plan:   Jennifer Leach is 36 y.o. [id number] ppd# 13 who presents today with vaginal swelling.   #Bacterial Vaginitis Cervical exam was notable for labial swelling and vaginal tenderness along with vaginal bleeding. Performed a wet prep. Wet prep was found to have clue cells and moderate amount of leukocytes. Will treat with Flagyl BID for 7 days and treat labial swelling with cold compress and ibuprofen as needed. Patient will follow-up with provider at postpartum visit in 4 weeks. Patient is agreeable with plan.  - Ordered Flagyl BID for 7 days - Follow-up with provider at postpartum visit 4 weeks from now   #Preeclampsia r/o Patient was found to have elevated blood pressures with max of 152/92. Ordered CBC, CMP, and Urine P/Cr ratio. CBC and CMP were unremarkable. UPC was found to be 0.16.    I confirm that I have verified the information documented in the medical student's note and that I have also personally reperformed the history, physical exam and all medical decision making activities of this service and have verified that all service and findings are accurately documented in this student's note.   Jennifer Leach, CNM 03/04/2017 6:00 AM

## 2017-03-04 NOTE — Progress Notes (Signed)
Written and verbal d/c instructions given and understanding voiced. 

## 2017-03-08 ENCOUNTER — Encounter: Payer: Medicaid Other | Admitting: Certified Nurse Midwife

## 2017-03-14 DIAGNOSIS — Z3493 Encounter for supervision of normal pregnancy, unspecified, third trimester: Secondary | ICD-10-CM

## 2017-03-15 ENCOUNTER — Ambulatory Visit (INDEPENDENT_AMBULATORY_CARE_PROVIDER_SITE_OTHER): Payer: Medicaid Other | Admitting: Certified Nurse Midwife

## 2017-03-15 ENCOUNTER — Encounter: Payer: Self-pay | Admitting: Certified Nurse Midwife

## 2017-03-15 DIAGNOSIS — I10 Essential (primary) hypertension: Secondary | ICD-10-CM

## 2017-03-15 DIAGNOSIS — Z9851 Tubal ligation status: Secondary | ICD-10-CM

## 2017-03-15 MED ORDER — AMLODIPINE BESYLATE 2.5 MG PO TABS: 5.0000 mg | ORAL_TABLET | Freq: Every day | ORAL | Status: DC

## 2017-03-15 NOTE — Progress Notes (Signed)
..  Post Partum Exam  Jennifer Leach is a 36 y.o. [phone] female who presents for a postpartum visit. She is 3 weeks postpartum following a spontaneous vaginal delivery. I have fully reviewed the prenatal and intrapartum course. The delivery was at 37.5 gestational weeks.  Anesthesia: epidural. Postpartum course has been 37.5. Baby's course has been normal. Baby is feeding by bottle - Similac Advance. Bleeding brown. Bowel function is normal. Bladder function is normal. Patient is not sexually active. Contraception method is tubal ligation. Postpartum depression screening:neg.  Desires to return to work.  Discussed elevated blood pressures, denies HA, vision changes or epigastric pain.  Normal labs in MAU in late May.  Reports perineal swelling has resolved.    The following portions of the patient's history were reviewed and updated as appropriate: allergies, current medications, past family history, past medical history, past social history, past surgical history and problem list.  Review of Systems Pertinent items noted in HPI and remainder of comprehensive ROS otherwise negative.    Objective:  not currently breastfeeding.  General:  alert, cooperative and no distress   Breasts:  inspection negative, no nipple discharge or bleeding, no masses or nodularity palpable  Lungs: clear to auscultation bilaterally  Heart:  regular rate and rhythm, S1, S2 normal, no murmur, click, rub or gallop  Abdomen: soft, non-tender; bowel sounds normal; no masses,  no organomegaly   Vulva:  normal  Pelvic Exam: Not performed.        Assessment:    Normal 3 week postpartum exam. Pap smear not done at today's visit.   Hypertension, new diagnosis: Norvasc started.  Plan:   1. Contraception: tubal ligation 2. Last pap 07/25/16: normal.  Return to work note completed.  3. Follow up in: 1 month or as needed.

## 2017-04-10 ENCOUNTER — Ambulatory Visit: Payer: Medicaid Other | Admitting: Certified Nurse Midwife

## 2017-04-12 ENCOUNTER — Encounter (HOSPITAL_COMMUNITY): Payer: Self-pay | Admitting: Emergency Medicine

## 2017-04-12 ENCOUNTER — Ambulatory Visit (HOSPITAL_COMMUNITY)
Admission: EM | Admit: 2017-04-12 | Discharge: 2017-04-12 | Disposition: A | Discharge status: Home / Self Care | Payer: Medicaid Other | Attending: Internal Medicine | Admitting: Internal Medicine

## 2017-04-12 DIAGNOSIS — N898 Other specified noninflammatory disorders of vagina: Secondary | ICD-10-CM

## 2017-04-12 DIAGNOSIS — N3001 Acute cystitis with hematuria: Secondary | ICD-10-CM

## 2017-04-12 DIAGNOSIS — R300 Dysuria: Secondary | ICD-10-CM

## 2017-04-12 DIAGNOSIS — Z87891 Personal history of nicotine dependence: Secondary | ICD-10-CM | POA: Insufficient documentation

## 2017-04-12 DIAGNOSIS — Z3202 Encounter for pregnancy test, result negative: Secondary | ICD-10-CM

## 2017-04-12 DIAGNOSIS — Z202 Contact with and (suspected) exposure to infections with a predominantly sexual mode of transmission: Secondary | ICD-10-CM | POA: Diagnosis not present

## 2017-04-12 LAB — POCT URINALYSIS DIP (DEVICE)
Bilirubin Urine: NEGATIVE
GLUCOSE, UA: NEGATIVE mg/dL
KETONES UR: NEGATIVE mg/dL
Nitrite: POSITIVE — AB
PH: 7 (ref 5.0–8.0)
PROTEIN: NEGATIVE mg/dL
SPECIFIC GRAVITY, URINE: 1.025 (ref 1.005–1.030)
UROBILINOGEN UA: 4 mg/dL — AB (ref 0.0–1.0)

## 2017-04-12 LAB — POCT PREGNANCY, URINE: Preg Test, Ur: NEGATIVE

## 2017-04-12 MED ORDER — FLUCONAZOLE 200 MG PO TABS: ORAL_TABLET | ORAL | 0 refills | Status: DC

## 2017-04-12 MED ORDER — PHENAZOPYRIDINE HCL 200 MG PO TABS: 200.0000 mg | ORAL_TABLET | Freq: Three times a day (TID) | ORAL | 0 refills | Status: DC | PRN

## 2017-04-12 MED ORDER — CEPHALEXIN 500 MG PO CAPS: 500.0000 mg | ORAL_CAPSULE | Freq: Four times a day (QID) | ORAL | 0 refills | Status: DC

## 2017-04-12 NOTE — ED Triage Notes (Signed)
Low abdominal cramping.  Patient has minimal vaginal discharge.  6/30 was onset of symptoms.  Denies burning with urination.  Complains of itchiness

## 2017-04-12 NOTE — ED Provider Notes (Signed)
CSN: [id number]     Arrival date & time 04/12/17  1051 History   First MD Initiated Contact with Patient 04/12/17 1203     Chief Complaint  Patient presents with  . Exposure to STD   (Consider location/radiation/quality/duration/timing/severity/associated sxs/prior Treatment) The history is provided by the patient.  Dysuria  Pain quality:  Aching and burning Pain severity:  Moderate Onset quality:  Gradual Duration:  4 days Timing:  Constant Progression:  Worsening Chronicity:  New Recent urinary tract infections: no   Relieved by:  Nothing Worsened by:  Nothing Ineffective treatments:  None tried Urinary symptoms: discolored urine, foul-smelling urine and frequent urination   Urinary symptoms: no hematuria, no hesitancy and no bladder incontinence   Associated symptoms: vaginal discharge   Associated symptoms: no abdominal pain, no fever, no flank pain, no genital lesions, no nausea and no vomiting   Risk factors: sexually active and sexually transmitted infections   Risk factors: not pregnant and no recurrent urinary tract infections     Past Medical History:  Diagnosis Date  . Bacterial vaginitis 03/04/2017  . Gonorrhea   . Labial swelling 03/04/2017  . MRSA (methicillin resistant staph aureus) culture positive 08/06/2014   Past Surgical History:  Procedure Laterality Date  . NO PAST SURGERIES    . TUBAL LIGATION N/A 02/19/2017   Procedure: POST PARTUM TUBAL LIGATION;  Surgeon: Hermina StaggersErvin, Michael L, MD;  [facility];  Service: Gynecology;  Laterality: N/A;  . WISDOM TOOTH EXTRACTION     Family History  Problem Relation Age of Onset  . Hypertension Mother    Social History  Substance Use Topics  . Smoking status: Former Smoker    Packs/day: 0.25    Years: 9.00    Types: Cigarettes    Quit date: 06/09/2012  . Smokeless tobacco: Never Used  . Alcohol use No   OB History    Gravida Para Term Preterm AB Living   4 4 4    0 4   SAB TAB Ectopic Multiple  Live Births     0   0 4     Review of Systems  Constitutional: Negative for chills and fever.  HENT: Negative.   Respiratory: Negative.   Cardiovascular: Negative.   Gastrointestinal: Negative for abdominal pain, nausea and vomiting.  Genitourinary: Positive for dysuria and vaginal discharge. Negative for flank pain, pelvic pain and urgency.  Musculoskeletal: Negative.   Skin: Negative.   Neurological: Negative.     Allergies  Septra ds [sulfamethoxazole-trimethoprim]  Home Medications   Prior to Admission medications   Medication Sig Start Date End Date Taking? Authorizing Provider  acetaminophen (TYLENOL) 325 MG tablet Take 650 mg by mouth every 6 (six) hours as needed for mild pain.     [provider]  cephALEXin (KEFLEX) 500 MG capsule Take 1 capsule (500 mg total) by mouth 4 (four) times daily. 04/12/17   Dorena BodoKennard, Udell Mazzocco, NP  fluconazole (DIFLUCAN) 200 MG tablet Take one tablet today, wait 3 days, take the second tablet 04/12/17   Dorena BodoKennard, Othelia Riederer, NP  ibuprofen (ADVIL,MOTRIN) 600 MG tablet Take 1 tablet (600 mg total) by mouth every 6 (six) hours. Patient not taking: Reported on 03/15/2017 02/21/17   Orvilla Cornwallenney, Rachelle A, CNM  phenazopyridine (PYRIDIUM) 200 MG tablet Take 1 tablet (200 mg total) by mouth 3 (three) times daily as needed for pain. 04/12/17   Dorena BodoKennard, Josslynn Mentzer, NP  senna-docusate (SENOKOT-S) 8.6-50 MG tablet Take 2 tablets by mouth at bedtime. Patient not taking: Reported  on 03/15/2017 02/21/17   Roe Coombs, CNM   Meds Ordered and Administered this Visit  Medications - No data to display  BP 124/82 (BP Location: Right Arm)   Pulse 76   Temp 97.9 F (36.6 C) (Oral)   Resp 18   LMP 04/08/2017   SpO2 100%  No data found.   Physical Exam  Constitutional: She is oriented to person, place, and time. She appears well-developed and well-nourished. No distress.  HENT:  Head: Normocephalic and atraumatic.  Right Ear: External ear normal.  Left Ear:  External ear normal.  Abdominal: Soft. There is no tenderness. There is no CVA tenderness.  Neurological: She is alert and oriented to person, place, and time.  Skin: Skin is warm and dry. Capillary refill takes less than 2 seconds. No rash noted. She is not diaphoretic. No erythema.  Psychiatric: She has a normal mood and affect. Her behavior is normal.  Nursing note and vitals reviewed.   Urgent Care Course     Procedures (including critical care time)  Labs Review Labs Reviewed  POCT URINALYSIS DIP (DEVICE) - Abnormal; Notable for the following:       Result Value   Hgb urine dipstick TRACE (*)    Urobilinogen, UA 4.0 (*)    Nitrite POSITIVE (*)    Leukocytes, UA TRACE (*)    All other components within normal limits  URINE CULTURE  POCT PREGNANCY, URINE  URINE CYTOLOGY ANCILLARY ONLY    Imaging Review No results found.    MDM   1. Acute cystitis with hematuria     Treating for UTI. Also given Diflucan as well. Urine cytology obtained along with urine culture. We'll notify the patient of significant or positive findings.    Dorena Bodo, NP 04/12/17 1239

## 2017-04-12 NOTE — Discharge Instructions (Signed)
You are being treated today for a urinary tract infection. I have prescribed Keflex, take 1 tablet 4 times a day for 5 days. I have also prescribed Pyridium. Take 1 tablet a day 3 times a day for 2 days. Your urine will be sent for culture and you will be notified should any change in therapy be needed. Drink plenty of fluids and rest. Should your symptoms fail to resolve, follow up with your primary care provider or return to clinic.   You have been screened for multiple types of infection diseases such as Gonorrhea, Chlamydia, bacteria vaginosis, and yeast. You will be notified of the results of these tests in 24-72 hours. If any therapy needs to be started or if your current therapy needs to be changed, you will be notified and and given instructions as to what to do. If your symptoms persist or fail to resolve, follow up with your primary care provider or return to clinic.

## 2017-04-12 NOTE — ED Notes (Signed)
Sent patient to the bathroom to obtain a dirty and clean urine with instructions.

## 2017-04-13 LAB — URINE CYTOLOGY ANCILLARY ONLY
Chlamydia: NEGATIVE
Neisseria Gonorrhea: NEGATIVE
Trichomonas: NEGATIVE

## 2017-04-14 LAB — URINE CULTURE

## 2017-04-17 LAB — URINE CYTOLOGY ANCILLARY ONLY
Bacterial vaginitis: NEGATIVE
Candida vaginitis: NEGATIVE

## 2017-05-12 ENCOUNTER — Ambulatory Visit (HOSPITAL_COMMUNITY)
Admission: EM | Admit: 2017-05-12 | Discharge: 2017-05-12 | Disposition: A | Discharge status: Home / Self Care | Payer: Medicaid Other | Attending: Radiology | Admitting: Radiology

## 2017-05-12 DIAGNOSIS — R102 Pelvic and perineal pain: Secondary | ICD-10-CM | POA: Diagnosis not present

## 2017-05-12 DIAGNOSIS — Z87891 Personal history of nicotine dependence: Secondary | ICD-10-CM | POA: Diagnosis not present

## 2017-05-12 DIAGNOSIS — Z79899 Other long term (current) drug therapy: Secondary | ICD-10-CM | POA: Insufficient documentation

## 2017-05-12 DIAGNOSIS — N898 Other specified noninflammatory disorders of vagina: Secondary | ICD-10-CM

## 2017-05-12 DIAGNOSIS — Z888 Allergy status to other drugs, medicaments and biological substances status: Secondary | ICD-10-CM | POA: Insufficient documentation

## 2017-05-12 DIAGNOSIS — Z9889 Other specified postprocedural states: Secondary | ICD-10-CM | POA: Diagnosis not present

## 2017-05-12 DIAGNOSIS — Z113 Encounter for screening for infections with a predominantly sexual mode of transmission: Secondary | ICD-10-CM

## 2017-05-12 DIAGNOSIS — Z202 Contact with and (suspected) exposure to infections with a predominantly sexual mode of transmission: Secondary | ICD-10-CM

## 2017-05-12 DIAGNOSIS — Z711 Person with feared health complaint in whom no diagnosis is made: Secondary | ICD-10-CM

## 2017-05-12 DIAGNOSIS — Z8249 Family history of ischemic heart disease and other diseases of the circulatory system: Secondary | ICD-10-CM | POA: Insufficient documentation

## 2017-05-12 MED ORDER — AZITHROMYCIN 250 MG PO TABS
ORAL_TABLET | ORAL | Status: AC
  Filled 2017-05-12: qty 4

## 2017-05-12 MED ORDER — AZITHROMYCIN 250 MG PO TABS
1000.0000 mg | ORAL_TABLET | Freq: Once | ORAL | Status: AC
  Administered 2017-05-12: 1000 mg via ORAL

## 2017-05-12 MED ORDER — MICONAZOLE NITRATE 1200 & 2 MG & % VA KIT: 1.0000 | PACK | Freq: Once | VAGINAL | 0 refills | Status: AC

## 2017-05-12 MED ORDER — METRONIDAZOLE 500 MG PO TABS: 500.0000 mg | ORAL_TABLET | Freq: Two times a day (BID) | ORAL | 0 refills | Status: AC

## 2017-05-12 MED ORDER — CEFTRIAXONE SODIUM 250 MG IJ SOLR
250.0000 mg | Freq: Once | INTRAMUSCULAR | Status: AC
  Administered 2017-05-12: 250 mg via INTRAMUSCULAR

## 2017-05-12 MED ORDER — CEFTRIAXONE SODIUM 250 MG IJ SOLR
INTRAMUSCULAR | Status: AC
  Filled 2017-05-12: qty 250

## 2017-05-12 NOTE — ED Provider Notes (Signed)
CSN: [id number]     Arrival date & time 05/12/17  1913 History   First MD Initiated Contact with Patient 05/12/17 1955     Chief Complaint  Patient presents with  . Vaginal Discharge   (Consider location/radiation/quality/duration/timing/severity/associated sxs/prior Treatment) 36 y.o. female presents with vaginal burning white discharge and foul smell. Patient states that she has been exposed to STD and is concerned that she has a combination of STD including a yest infection, BV and gonorrhea. Patient reports suprapubic pain that is similar to when she was previously diagnosed with gonorrhea. Condition is acute in nature. Condition is made better by nothing. Condition is made worse by nothing. Patient denies any treatment prior to there arrival at this facility. Patient declines a pelvic exam and elects or urine cytology.       Past Medical History:  Diagnosis Date  . Bacterial vaginitis 03/04/2017  . Gonorrhea   . Labial swelling 03/04/2017  . MRSA (methicillin resistant staph aureus) culture positive 08/06/2014   Past Surgical History:  Procedure Laterality Date  . NO PAST SURGERIES    . TUBAL LIGATION N/A 02/19/2017   Procedure: POST PARTUM TUBAL LIGATION;  Surgeon: Ervin, Michael L, MD;  Location: [facility];  Service: Gynecology;  Laterality: N/A;  . WISDOM TOOTH EXTRACTION     Family History  Problem Relation Age of Onset  . Hypertension Mother    Social History  Substance Use Topics  . Smoking status: Former Smoker    Packs/day: 0.25    Years: 9.00    Types: Cigarettes    Quit date: 06/09/2012  . Smokeless tobacco: Never Used  . Alcohol use No   OB History    Gravida Para Term Preterm AB Living   4 4 4   0 4   SAB TAB Ectopic Multiple Live Births     0   0 4     Review of Systems  Constitutional: Negative for chills and fever.  HENT: Negative for ear pain and sore throat.   Eyes: Negative for pain and visual disturbance.  Respiratory: Negative for  cough and shortness of breath.   Cardiovascular: Negative for chest pain and palpitations.  Gastrointestinal: Negative for abdominal pain and vomiting.  Genitourinary: Positive for pelvic pain, vaginal discharge and vaginal pain. Negative for dysuria and hematuria.  Musculoskeletal: Negative for arthralgias and back pain.  Skin: Negative for color change and rash.  Neurological: Negative for seizures and syncope.  All other systems reviewed and are negative.   Allergies  Septra ds [sulfamethoxazole-trimethoprim]  Home Medications   Prior to Admission medications   Medication Sig Start Date End Date Taking? Authorizing Provider  acetaminophen (TYLENOL) 325 MG tablet Take 650 mg by mouth every 6 (six) hours as needed for mild pain.     [provider]  cephALEXin (KEFLEX) 500 MG capsule Take 1 capsule (500 mg total) by mouth 4 (four) times daily. 04/12/17   Kennard, Lawrence, NP  fluconazole (DIFLUCAN) 200 MG tablet Take one tablet today, wait 3 days, take the second tablet 04/12/17   Kennard, Lawrence, NP  ibuprofen (ADVIL,MOTRIN) 600 MG tablet Take 1 tablet (600 mg total) by mouth every 6 (six) hours. Patient not taking: Reported on 03/15/2017 02/21/17   Denney, Rachelle A, CNM  metroNIDAZOLE (FLAGYL) 500 MG tablet Take 1 tablet (500 mg total) by mouth 2 (two) times daily. 05/12/17 05/19/17  Omohundro, Jennifer C, NP  miconazole (MONISTAT 1 COMBO PACK) kit Place 1 each vaginally once. 05/12/17   05/12/17  Omohundro, Jennifer C, NP  phenazopyridine (PYRIDIUM) 200 MG tablet Take 1 tablet (200 mg total) by mouth 3 (three) times daily as needed for pain. 04/12/17   Kennard, Lawrence, NP  senna-docusate (SENOKOT-S) 8.6-50 MG tablet Take 2 tablets by mouth at bedtime. Patient not taking: Reported on 03/15/2017 02/21/17   Denney, Rachelle A, CNM   Meds Ordered and Administered this Visit   Medications  cefTRIAXone (ROCEPHIN) injection 250 mg (not administered)  azithromycin (ZITHROMAX) tablet 1,000 mg  (not administered)    BP (!) 135/92 (BP Location: Right Arm)   Pulse 68   Temp (!) 97.4 F (36.3 C)   Resp 18   LMP 04/26/2017   SpO2 100%   Breastfeeding? No  No data found.   Physical Exam  Constitutional: She is oriented to person, place, and time. She appears well-developed and well-nourished.  HENT:  Head: Normocephalic and atraumatic.  Eyes: Conjunctivae are normal.  Neck: Normal range of motion.  Pulmonary/Chest: Effort normal.  Neurological: She is alert and oriented to person, place, and time.  Psychiatric: She has a normal mood and affect.  Nursing note and vitals reviewed.   Urgent Care Course     Procedures (including critical care time)  Labs Review Labs Reviewed  URINE CYTOLOGY ANCILLARY ONLY    Imaging Review No results found.      MDM  No diagnosis found.    Omohundro, Jennifer C, NP 05/12/17 2035  

## 2017-05-12 NOTE — ED Triage Notes (Signed)
Pt  Reports  Pain  /  Itching  /  Burning  Discharge   X   2  Days

## 2017-05-14 LAB — URINE CYTOLOGY ANCILLARY ONLY
CHLAMYDIA, DNA PROBE: NEGATIVE
NEISSERIA GONORRHEA: NEGATIVE
Trichomonas: NEGATIVE

## 2017-05-17 LAB — URINE CYTOLOGY ANCILLARY ONLY: CANDIDA VAGINITIS: NEGATIVE

## 2017-06-14 ENCOUNTER — Encounter (HOSPITAL_COMMUNITY): Payer: Self-pay | Admitting: *Deleted

## 2017-06-14 DIAGNOSIS — N898 Other specified noninflammatory disorders of vagina: Secondary | ICD-10-CM | POA: Insufficient documentation

## 2017-06-14 DIAGNOSIS — Z5321 Procedure and treatment not carried out due to patient leaving prior to being seen by health care provider: Secondary | ICD-10-CM | POA: Insufficient documentation

## 2017-06-14 DIAGNOSIS — N760 Acute vaginitis: Secondary | ICD-10-CM | POA: Diagnosis not present

## 2017-06-14 DIAGNOSIS — B9689 Other specified bacterial agents as the cause of diseases classified elsewhere: Secondary | ICD-10-CM | POA: Diagnosis not present

## 2017-06-14 NOTE — ED Triage Notes (Signed)
Pt c/o pelvic pain with significant white, thick discharge for 3 days.

## 2017-06-15 ENCOUNTER — Emergency Department (HOSPITAL_COMMUNITY)
Admission: EM | Admit: 2017-06-15 | Discharge: 2017-06-15 | Disposition: A | Discharge status: Home / Self Care | Payer: Medicaid Other | Attending: Emergency Medicine | Admitting: Emergency Medicine

## 2017-06-15 ENCOUNTER — Inpatient Hospital Stay (EMERGENCY_DEPARTMENT_HOSPITAL)
Admission: AD | Admit: 2017-06-15 | Discharge: 2017-06-15 | Disposition: A | Discharge status: Home / Self Care | Payer: Medicaid Other | Source: Ambulatory Visit | Attending: Obstetrics and Gynecology | Admitting: Obstetrics and Gynecology

## 2017-06-15 ENCOUNTER — Encounter (HOSPITAL_COMMUNITY): Payer: Self-pay | Admitting: *Deleted

## 2017-06-15 DIAGNOSIS — N760 Acute vaginitis: Secondary | ICD-10-CM | POA: Diagnosis not present

## 2017-06-15 DIAGNOSIS — B9689 Other specified bacterial agents as the cause of diseases classified elsewhere: Secondary | ICD-10-CM | POA: Diagnosis not present

## 2017-06-15 LAB — WET PREP, GENITAL
Sperm: NONE SEEN
Trich, Wet Prep: NONE SEEN
YEAST WET PREP: NONE SEEN

## 2017-06-15 LAB — URINALYSIS, ROUTINE W REFLEX MICROSCOPIC
BACTERIA UA: NONE SEEN
Bilirubin Urine: NEGATIVE
Glucose, UA: NEGATIVE mg/dL
HGB URINE DIPSTICK: NEGATIVE
KETONES UR: NEGATIVE mg/dL
NITRITE: NEGATIVE
PROTEIN: NEGATIVE mg/dL
Specific Gravity, Urine: 1.025 (ref 1.005–1.030)
pH: 6 (ref 5.0–8.0)

## 2017-06-15 LAB — POCT PREGNANCY, URINE: Preg Test, Ur: NEGATIVE

## 2017-06-15 MED ORDER — METRONIDAZOLE 500 MG PO TABS: 500.0000 mg | ORAL_TABLET | Freq: Two times a day (BID) | ORAL | 0 refills | Status: DC

## 2017-06-15 MED ORDER — FLUCONAZOLE 150 MG PO TABS: 150.0000 mg | ORAL_TABLET | Freq: Once | ORAL | 0 refills | Status: AC

## 2017-06-15 NOTE — ED Notes (Signed)
Pt called for A13 but no answer in waiting room

## 2017-06-15 NOTE — MAU Note (Signed)
PT  SAYS SHE HAS PAIN IN LOWER ABD -   STARTED  WED-   NO VAG BLEEDING.   BTL- IN 02-2017.    LAST SEX-  WED.    HAS VAG  WHITE  D/C -  NOT A FISHY ODOR .

## 2017-06-15 NOTE — Discharge Instructions (Signed)

## 2017-06-15 NOTE — ED Notes (Signed)
Unable to locate pt x1

## 2017-06-15 NOTE — MAU Provider Note (Signed)
History     CSN: [id number]  Arrival date and time: 06/15/17 3244   First Provider Initiated Contact with Patient 06/15/17 0135      Chief Complaint  Patient presents with  . Pelvic Pain   Pelvic Pain  The patient's primary symptoms include genital itching, pelvic pain and vaginal discharge. This is a new problem. The current episode started in the past 7 days. The problem occurs intermittently. The problem has been unchanged. Pain severity now: 7/10. The problem affects both sides. She is not pregnant. Pertinent negatives include no chills, dysuria, fever, frequency, nausea, urgency or vomiting. The vaginal discharge was white and thick. There has been no bleeding. She has tried acetaminophen and heating pads for the symptoms. The treatment provided mild relief. She is sexually active. She uses tubal ligation for contraception. Her menstrual history has been regular (LMP 05/27/17 ).   Past Medical History:  Diagnosis Date  . Bacterial vaginitis 03/04/2017  . Gonorrhea   . Labial swelling 03/04/2017  . MRSA (methicillin resistant staph aureus) culture positive 08/06/2014    Past Surgical History:  Procedure Laterality Date  . NO PAST SURGERIES    . TUBAL LIGATION N/A 02/19/2017   Procedure: POST PARTUM TUBAL LIGATION;  Surgeon: [name], MD;  Location: [facility] BIRTHING SUITES;  Service: Gynecology;  Laterality: N/A;  . WISDOM TOOTH EXTRACTION      Family History  Problem Relation Age of Onset  . Hypertension Mother     Social History  Substance Use Topics  . Smoking status: Former Smoker    Packs/day: 0.25    Years: 9.00    Types: Cigarettes    Quit date: 06/09/2012  . Smokeless tobacco: Never Used  . Alcohol use No    Allergies:  Allergies  Allergen Reactions  . Septra Ds [Sulfamethoxazole-Trimethoprim] Anaphylaxis, Swelling, Rash and Other (See Comments)    Reaction:  Facial swelling     Facility-Administered Medications Prior to Admission  Medication Dose Route  Frequency Provider Last Rate Last Dose  . amLODipine (NORVASC) tablet 5 mg  5 mg Oral Daily Denney, Rachelle A, CNM       Prescriptions Prior to Admission  Medication Sig Dispense Refill Last Dose  . acetaminophen (TYLENOL) 325 MG tablet Take 650 mg by mouth every 6 (six) hours as needed for mild pain.    Not Taking  . cephALEXin (KEFLEX) 500 MG capsule Take 1 capsule (500 mg total) by mouth 4 (four) times daily. 20 capsule 0   . fluconazole (DIFLUCAN) 200 MG tablet Take one tablet today, wait 3 days, take the second tablet 2 tablet 0   . ibuprofen (ADVIL,MOTRIN) 600 MG tablet Take 1 tablet (600 mg total) by mouth every 6 (six) hours. (Patient not taking: Reported on 03/15/2017) 120 tablet 2 Not Taking  . phenazopyridine (PYRIDIUM) 200 MG tablet Take 1 tablet (200 mg total) by mouth 3 (three) times daily as needed for pain. 10 tablet 0   . senna-docusate (SENOKOT-S) 8.6-50 MG tablet Take 2 tablets by mouth at bedtime. (Patient not taking: Reported on 03/15/2017) 60 tablet 2 Not Taking    Review of Systems  Constitutional: Negative for chills and fever.  Gastrointestinal: Negative for nausea and vomiting.  Genitourinary: Positive for pelvic pain and vaginal discharge. Negative for dysuria, frequency and urgency.   Physical Exam   Blood pressure 113/76, pulse 72, temperature 97.6 F (36.4 C), temperature source Oral, resp. rate 20, height  (1.676 m), weight 153 lb 8 oz (  69.6 kg), last menstrual period 05/27/2017, not currently breastfeeding.  Physical Exam  Nursing note and vitals reviewed. Constitutional: She is oriented to person, place, and time. She appears well-developed and well-nourished. No distress.  HENT:  Head: Normocephalic.  Cardiovascular: Normal rate.   Respiratory: Effort normal.  GI: Soft. There is no tenderness. There is no rebound.  Neurological: She is alert and oriented to person, place, and time.  Skin: Skin is warm and dry.  Psychiatric: She has a normal mood  and affect.   Results for orders placed or performed during the hospital encounter of 06/15/17 (from the past 24 hour(s))  Urinalysis, Routine w reflex microscopic     Status: Abnormal   Collection Time: 06/15/17  1:09 AM  Result Value Ref Range   Color, Urine YELLOW YELLOW   APPearance HAZY (A) CLEAR   Specific Gravity, Urine 1.025 1.005 - 1.030   pH 6.0 5.0 - 8.0   Glucose, UA NEGATIVE NEGATIVE mg/dL   Hgb urine dipstick NEGATIVE NEGATIVE   Bilirubin Urine NEGATIVE NEGATIVE   Ketones, ur NEGATIVE NEGATIVE mg/dL   Protein, ur NEGATIVE NEGATIVE mg/dL   Nitrite NEGATIVE NEGATIVE   Leukocytes, UA LARGE (A) NEGATIVE   RBC / HPF 0-5 0 - 5 RBC/hpf   WBC, UA 0-5 0 - 5 WBC/hpf   Bacteria, UA NONE SEEN NONE SEEN   Squamous Epithelial / LPF 6-30 (A) NONE SEEN   Mucus PRESENT   Wet prep, genital     Status: Abnormal   Collection Time: 06/15/17  1:14 AM  Result Value Ref Range   Yeast Wet Prep HPF POC NONE SEEN NONE SEEN   Trich, Wet Prep NONE SEEN NONE SEEN   Clue Cells Wet Prep HPF POC PRESENT (A) NONE SEEN   WBC, Wet Prep HPF POC MODERATE (A) NONE SEEN   Sperm NONE SEEN   Pregnancy, urine POC     Status: None   Collection Time: 06/15/17  1:20 AM  Result Value Ref Range   Preg Test, Ur NEGATIVE NEGATIVE    MAU Course  Procedures  MDM  Assessment and Plan   1. Bacterial vaginosis    DC home Comfort measures reviewed  RX: flagyl BID x 7 days #14, diflucan as directed  Return to MAU as needed FU with OB as planned  Follow-up Information    [facility].   Specialty:  Obstetrics and Gynecology Contact information: [address] [place] [id number] [phone]           Thressa ShellerHeather Kiahna Banghart 06/15/2017,

## 2017-06-18 LAB — GC/CHLAMYDIA PROBE AMP (~~LOC~~) NOT AT ARMC
CHLAMYDIA, DNA PROBE: NEGATIVE
NEISSERIA GONORRHEA: NEGATIVE

## 2017-09-20 ENCOUNTER — Encounter (HOSPITAL_COMMUNITY): Payer: Self-pay | Admitting: Family Medicine

## 2017-09-20 ENCOUNTER — Ambulatory Visit (HOSPITAL_COMMUNITY)
Admission: EM | Admit: 2017-09-20 | Discharge: 2017-09-20 | Disposition: A | Discharge status: Home / Self Care | Payer: Medicaid Other | Attending: Emergency Medicine | Admitting: Emergency Medicine

## 2017-09-20 DIAGNOSIS — N760 Acute vaginitis: Secondary | ICD-10-CM

## 2017-09-20 DIAGNOSIS — Z87891 Personal history of nicotine dependence: Secondary | ICD-10-CM | POA: Insufficient documentation

## 2017-09-20 DIAGNOSIS — N898 Other specified noninflammatory disorders of vagina: Secondary | ICD-10-CM | POA: Diagnosis present

## 2017-09-20 MED ORDER — METRONIDAZOLE 500 MG PO TABS: 500.0000 mg | ORAL_TABLET | Freq: Two times a day (BID) | ORAL | 0 refills | Status: AC

## 2017-09-20 MED ORDER — FLUCONAZOLE 200 MG PO TABS: 200.0000 mg | ORAL_TABLET | Freq: Once | ORAL | 0 refills | Status: AC

## 2017-09-20 NOTE — ED Provider Notes (Signed)
[facility]    CSN: [id number] Arrival date & time: 09/20/17  1845     History   Chief Complaint Chief Complaint  Patient presents with  . Vaginal Discharge    HPI Jennifer Leach is a 36 y.o. female.   Jennifer Leach presents with complaints of vaginal itching and odor with white discharge which has been present for 1 week. Occasional abdominal cramping. States feels similar to previous episodes of BV she has had in the past. Denies abdominal pain currently, or back pain or fevers. LMP was last week. Tubal ligation history. Is sexually active with one partner, does not use condones, denies concern for STDS today. Denies urinary symptoms.    ROS per HPI.       Past Medical History:  Diagnosis Date  . Bacterial vaginitis 03/04/2017  . Gonorrhea   . Labial swelling 03/04/2017  . MRSA (methicillin resistant staph aureus) culture positive 08/06/2014    Patient Active Problem List   Diagnosis Date Noted  . H/O tubal ligation 03/15/2017  . Bacterial vaginitis 03/04/2017  . Labial swelling 03/04/2017    Past Surgical History:  Procedure Laterality Date  . NO PAST SURGERIES    . TUBAL LIGATION N/A 02/19/2017   Procedure: POST PARTUM TUBAL LIGATION;  Surgeon: Hermina StaggersErvin, Michael L, MD;  [facility];  Service: Gynecology;  Laterality: N/A;  . WISDOM TOOTH EXTRACTION      OB History    Gravida Para Term Preterm AB Living   4 4 4    0 4   SAB TAB Ectopic Multiple Live Births     0   0 4       Home Medications    Prior to Admission medications   Medication Sig Start Date End Date Taking? Authorizing Provider  fluconazole (DIFLUCAN) 200 MG tablet Take 1 tablet (200 mg total) by mouth once for 1 dose. 09/20/17 09/20/17  Georgetta HaberBurky, Natalie B, NP  metroNIDAZOLE (FLAGYL) 500 MG tablet Take 1 tablet (500 mg total) by mouth 2 (two) times daily for 7 days. 09/20/17 09/27/17  Georgetta HaberBurky, Natalie B, NP    Family History Family History  Problem Relation Age of Onset   . Hypertension Mother     Social History Social History   Tobacco Use  . Smoking status: Former Smoker    Packs/day: 0.25    Years: 9.00    Pack years: 2.25    Types: Cigarettes    Last attempt to quit: 06/09/2012    Years since quitting: 5.2  . Smokeless tobacco: Never Used  Substance Use Topics  . Alcohol use: No  . Drug use: No     Allergies   Septra ds [sulfamethoxazole-trimethoprim]   Review of Systems Review of Systems   Physical Exam Triage Vital Signs ED Triage Vitals  Enc Vitals Group     BP 09/20/17 1922 (!) 153/95     Pulse Rate 09/20/17 1922 76     Resp 09/20/17 1922 18     Temp 09/20/17 1922 98.5 F (36.9 C)     Temp src --      SpO2 09/20/17 1922 100 %     Weight --      Height --      Head Circumference --      Peak Flow --      Pain Score 09/20/17 2016 2     Pain Loc --      Pain Edu? --      Excl. in  GC? --    No data found.  Updated Vital Signs BP (!) 153/95   Pulse 76   Temp 98.5 F (36.9 C)   Resp 18   LMP 09/13/2017   SpO2 100%   Visual Acuity Right Eye Distance:   Left Eye Distance:   Bilateral Distance:    Right Eye Near:   Left Eye Near:    Bilateral Near:     Physical Exam  Constitutional: She is oriented to person, place, and time. She appears well-developed and well-nourished. No distress.  Cardiovascular: Normal rate, regular rhythm and normal heart sounds.  Pulmonary/Chest: Effort normal and breath sounds normal.  Abdominal: Soft. There is no tenderness. There is no CVA tenderness.  Pelvic exam deferred, urine collect for further testing  Neurological: She is alert and oriented to person, place, and time.  Skin: Skin is warm and dry.     UC Treatments / Results  Labs (all labs ordered are listed, but only abnormal results are displayed) Labs Reviewed  URINE CYTOLOGY ANCILLARY ONLY    EKG  EKG Interpretation None       Radiology No results found.  Procedures Procedures (including critical  care time)  Medications Ordered in UC Medications - No data to display   Initial Impression / Assessment and Plan / UC Course  I have reviewed the triage vital signs and the nursing notes.  Pertinent labs & imaging results that were available during my care of the patient were reviewed by me and considered in my medical decision making (see chart for details).     Flagyl initiated, may use diflucan at completion if needed. Withhold from intercourse until medication complete. Will notify of any positive findings and if any changes to treatment are needed.  Recommended establish with PCP for recheck of BP and for further management if needed. Patient verbalized understanding and agreeable to plan.    Final Clinical Impressions(s) / UC Diagnoses   Final diagnoses:  Acute vaginitis    ED Discharge Orders        Ordered    metroNIDAZOLE (FLAGYL) 500 MG tablet  2 times daily     09/20/17 2008    fluconazole (DIFLUCAN) 200 MG tablet   Once     09/20/17 2008       Controlled Substance Prescriptions [place] Controlled Substance Registry consulted? Not Applicable   Georgetta HaberBurky, Natalie B, NP 09/20/17 2039

## 2017-09-20 NOTE — Discharge Instructions (Signed)
Complete course of treatment for presumed Bv. May take yeast treatment at completion of antibiotics. Will notify you of any positive findings and if any changes to treatment are needed.   Please call tomorrow to set up to establish with a primary care provider for recheck of bp and further management as needed.

## 2017-09-20 NOTE — ED Triage Notes (Signed)
Pt here for 7 days of vaginal discharge, odor and irritation. Reports started after her period. Reports hx of BV.

## 2017-09-21 LAB — URINE CYTOLOGY ANCILLARY ONLY
CHLAMYDIA, DNA PROBE: NEGATIVE
Neisseria Gonorrhea: NEGATIVE
Trichomonas: NEGATIVE

## 2017-09-24 LAB — URINE CYTOLOGY ANCILLARY ONLY: Candida vaginitis: NEGATIVE

## 2017-12-07 ENCOUNTER — Encounter (HOSPITAL_COMMUNITY): Payer: Self-pay | Admitting: Family Medicine

## 2017-12-07 ENCOUNTER — Ambulatory Visit (HOSPITAL_COMMUNITY)
Admission: EM | Admit: 2017-12-07 | Discharge: 2017-12-07 | Disposition: A | Discharge status: Home / Self Care | Payer: Medicaid Other | Attending: Internal Medicine | Admitting: Internal Medicine

## 2017-12-07 DIAGNOSIS — Z711 Person with feared health complaint in whom no diagnosis is made: Secondary | ICD-10-CM

## 2017-12-07 DIAGNOSIS — N898 Other specified noninflammatory disorders of vagina: Secondary | ICD-10-CM

## 2017-12-07 DIAGNOSIS — Z113 Encounter for screening for infections with a predominantly sexual mode of transmission: Secondary | ICD-10-CM | POA: Diagnosis not present

## 2017-12-07 DIAGNOSIS — Z87891 Personal history of nicotine dependence: Secondary | ICD-10-CM | POA: Diagnosis not present

## 2017-12-07 DIAGNOSIS — N760 Acute vaginitis: Secondary | ICD-10-CM | POA: Diagnosis present

## 2017-12-07 DIAGNOSIS — Z3202 Encounter for pregnancy test, result negative: Secondary | ICD-10-CM

## 2017-12-07 LAB — POCT PREGNANCY, URINE: PREG TEST UR: NEGATIVE

## 2017-12-07 MED ORDER — FLUCONAZOLE 150 MG PO TABS: 150.0000 mg | ORAL_TABLET | Freq: Every day | ORAL | 0 refills | Status: DC

## 2017-12-07 MED ORDER — AZITHROMYCIN 250 MG PO TABS
1000.0000 mg | ORAL_TABLET | Freq: Once | ORAL | Status: AC
  Administered 2017-12-07: 1000 mg via ORAL

## 2017-12-07 MED ORDER — CEFTRIAXONE SODIUM 250 MG IJ SOLR
250.0000 mg | Freq: Once | INTRAMUSCULAR | Status: AC
  Administered 2017-12-07: 250 mg via INTRAMUSCULAR

## 2017-12-07 MED ORDER — AZITHROMYCIN 250 MG PO TABS
ORAL_TABLET | ORAL | Status: AC
  Filled 2017-12-07: qty 4

## 2017-12-07 MED ORDER — CEFTRIAXONE SODIUM 250 MG IJ SOLR
INTRAMUSCULAR | Status: AC
  Filled 2017-12-07: qty 250

## 2017-12-07 MED ORDER — METRONIDAZOLE 0.75 % VA GEL: 1.0000 | Freq: Two times a day (BID) | VAGINAL | 0 refills | Status: AC

## 2017-12-07 NOTE — Discharge Instructions (Signed)
You were treated empirically for gonorrhea, chlamydia, bacterial vaginitis, yeast. Azithromycin 1g by mouth and Rocephin 250mg  injection given in office today.  MetroGel and Diflucan as directed. Cytology sent, you will be contacted with any positive results that requires further treatment. Refrain from sexual activity for the next 7 days. Monitor for any worsening of symptoms, fever, abdominal pain, nausea, vomiting, to follow up for reevaluation.

## 2017-12-07 NOTE — ED Provider Notes (Signed)
[facility]    CSN: [id number] Arrival date & time: 12/07/17  1851     History   Chief Complaint Chief Complaint  Patient presents with  . Vaginitis    HPI Jennifer Leach is a 37 y.o. female.   37 year old female comes in for 1 week history of vaginal irritation, discharge, itching.  Is thick, fishy odor.  Used Monistat without relief.  Denies urinary symptoms such as frequency, dysuria, hematuria.  Denies fever, chills, night sweats.  Occasional abdominal pain that is intermittent, cramping in sensation.  Sexually active with one partner, no condom use.  History of tubal ligation.  LMP 11/13/2017.      Past Medical History:  Diagnosis Date  . Bacterial vaginitis 03/04/2017  . Gonorrhea   . Labial swelling 03/04/2017  . MRSA (methicillin resistant staph aureus) culture positive 08/06/2014    Patient Active Problem List   Diagnosis Date Noted  . H/O tubal ligation 03/15/2017  . Bacterial vaginitis 03/04/2017  . Labial swelling 03/04/2017    Past Surgical History:  Procedure Laterality Date  . NO PAST SURGERIES    . TUBAL LIGATION N/A 02/19/2017   Procedure: POST PARTUM TUBAL LIGATION;  Surgeon: [name], MD;  Location: [facility] BIRTHING SUITES;  Service: Gynecology;  Laterality: N/A;  . WISDOM TOOTH EXTRACTION      OB History    Gravida Para Term Preterm AB Living   4 4 4    0 4   SAB TAB Ectopic Multiple Live Births     0   0 4       Home Medications    Prior to Admission medications   Medication Sig Start Date End Date Taking? Authorizing Provider  fluconazole (DIFLUCAN) 150 MG tablet Take 1 tablet (150 mg total) by mouth daily. Take second dose 72 hours later if symptoms still persists. 12/07/17   Cathie Hoops, Everlyn Farabaugh V, PA-C  metroNIDAZOLE (METROGEL VAGINAL) 0.75 % vaginal gel Place 1 Applicatorful vaginally 2 (two) times daily for 5 days. 12/07/17 12/12/17  Belinda Fisher, PA-C    Family History Family History  Problem Relation Age of Onset  . Hypertension  Mother     Social History Social History   Tobacco Use  . Smoking status: Former Smoker    Packs/day: 0.25    Years: 9.00    Pack years: 2.25    Types: Cigarettes    Last attempt to quit: 06/09/2012    Years since quitting: 5.4  . Smokeless tobacco: Never Used  Substance Use Topics  . Alcohol use: No  . Drug use: No     Allergies   Septra ds [sulfamethoxazole-trimethoprim]   Review of Systems Review of Systems  Reason unable to perform ROS: See HPI as above.     Physical Exam Triage Vital Signs ED Triage Vitals [12/07/17 1937]  Enc Vitals Group     BP 131/83     Pulse Rate 69     Resp 18     Temp 98.5 F (36.9 C)     Temp src      SpO2 100 %     Weight      Height      Head Circumference      Peak Flow      Pain Score      Pain Loc      Pain Edu?      Excl. in GC?    No data found.  Updated Vital Signs BP 131/83  Pulse 69   Temp 98.5 F (36.9 C)   Resp 18   LMP 11/13/2017   SpO2 100%   Physical Exam  Constitutional: She is oriented to person, place, and time. She appears well-developed and well-nourished. No distress.  HENT:  Head: Normocephalic and atraumatic.  Eyes: Conjunctivae are normal. Pupils are equal, round, and reactive to light.  Cardiovascular: Normal rate, regular rhythm and normal heart sounds. Exam reveals no gallop and no friction rub.  No murmur heard. Pulmonary/Chest: Effort normal and breath sounds normal. She has no wheezes. She has no rales.  Abdominal: Soft. Bowel sounds are normal. She exhibits no mass. There is no tenderness. There is no rebound, no guarding and no CVA tenderness.  Neurological: She is alert and oriented to person, place, and time.  Skin: Skin is warm and dry.  Psychiatric: She has a normal mood and affect. Her behavior is normal. Judgment normal.     UC Treatments / Results  Labs (all labs ordered are listed, but only abnormal results are displayed) Labs Reviewed  POCT PREGNANCY, URINE  URINE  CYTOLOGY ANCILLARY ONLY    EKG  EKG Interpretation None       Radiology No results found.  Procedures Procedures (including critical care time)  Medications Ordered in UC Medications  azithromycin (ZITHROMAX) tablet 1,000 mg (not administered)  cefTRIAXone (ROCEPHIN) injection 250 mg (not administered)     Initial Impression / Assessment and Plan / UC Course  I have reviewed the triage vital signs and the nursing notes.  Pertinent labs & imaging results that were available during my care of the patient were reviewed by me and considered in my medical decision making (see chart for details).    Patient would like to be treated for gonorrhea and chlamydia empirically.  Would like to be covered for bacterial vaginitis and yeast as well.  Patient would like to avoid oral Flagyl.  Discussed with patient this will not cover trichomonas, and may need further treatment if results is positive, patient expresses understanding and would like to proceed with MetroGel.  Azithromycin 1 g and Rocephin injection 250 mg given in office today.  Start MetroGel and Diflucan as directed. Cytology sent, patient will be contacted with any positive results that require additional treatment. Patient to refrain from sexual activity for the next 7 days. Return precautions given.    Final Clinical Impressions(s) / UC Diagnoses   Final diagnoses:  Vaginal discharge  Concern about STD in female without diagnosis    ED Discharge Orders        Ordered    metroNIDAZOLE (METROGEL VAGINAL) 0.75 % vaginal gel  2 times daily     12/07/17 2034    fluconazole (DIFLUCAN) 150 MG tablet  Daily     12/07/17 2034        Belinda FisherYu, Ersa Delaney V, PA-C 12/07/17 2038

## 2017-12-07 NOTE — ED Triage Notes (Signed)
Pt here for vaginal itching, discharge and irritation. sts used OTC monistat without relief. Denies any urinary complaints. She wants to be checked for STDs.

## 2017-12-10 LAB — URINE CYTOLOGY ANCILLARY ONLY
CHLAMYDIA, DNA PROBE: NEGATIVE
Neisseria Gonorrhea: NEGATIVE
TRICH (WINDOWPATH): NEGATIVE

## 2017-12-12 LAB — URINE CYTOLOGY ANCILLARY ONLY: CANDIDA VAGINITIS: NEGATIVE

## 2018-01-15 IMAGING — US US OB TRANSVAGINAL
1 series · 15 of 28 positions shown · non-contrast
Comparison: 07/06/2016

CLINICAL DATA: Pain in pregnancy.

EXAM:
TRANSVAGINAL OB ULTRASOUND
TECHNIQUE: Transvaginal ultrasound was performed for complete evaluation of the
gestation as well as the maternal uterus, adnexal regions, and
pelvic cul-de-sac.

[Series 1: us ob transvaginal · 15 of 57 slices shown]
[im 1/57]
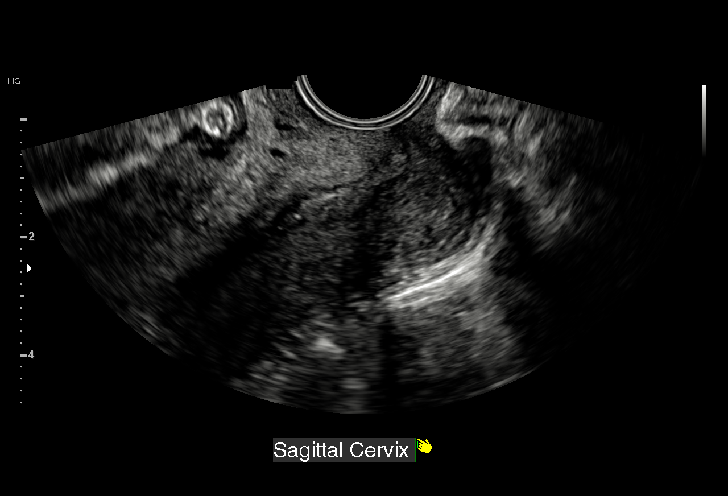
[im 5/57]
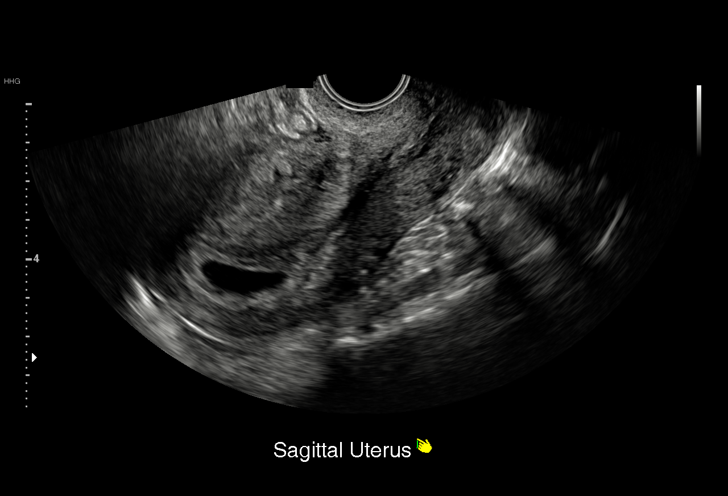
[im 9/57]
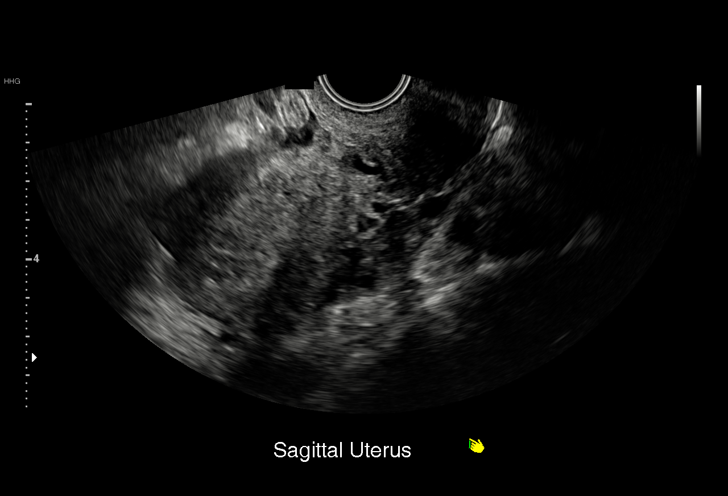
[im 13/57]
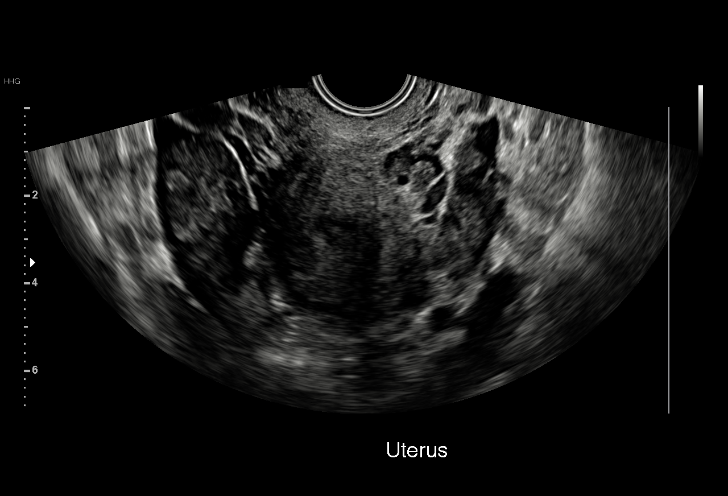
[im 17/57]
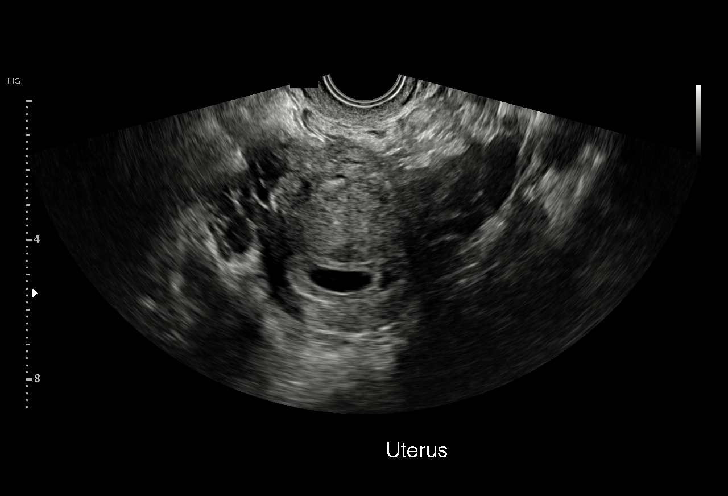
[im 21/57]
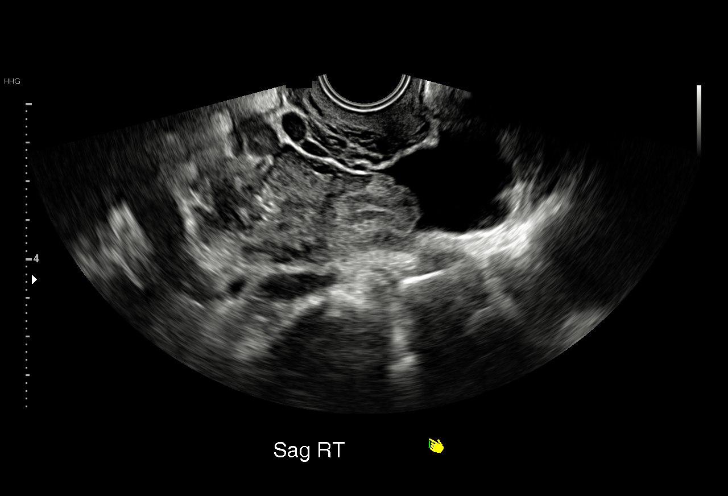
[im 25/57]
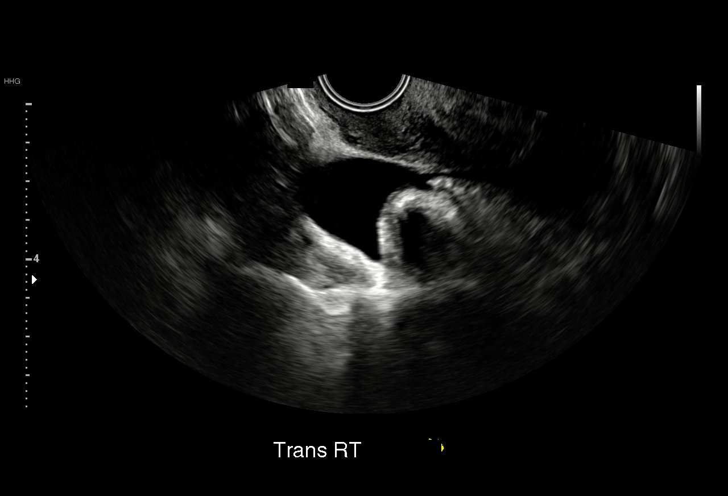
[im 30/57]
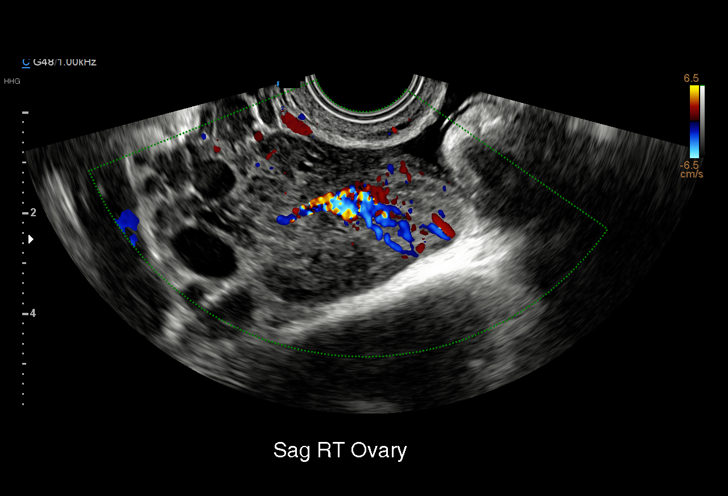
[im 32/57]
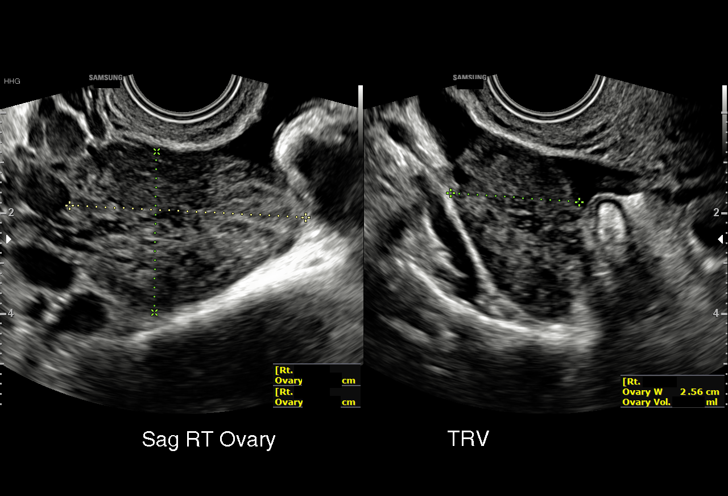
[im 36/57]
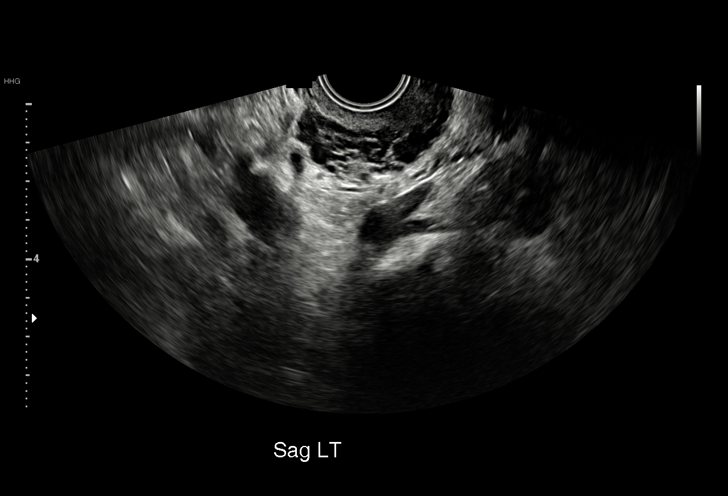
[im 40/57]
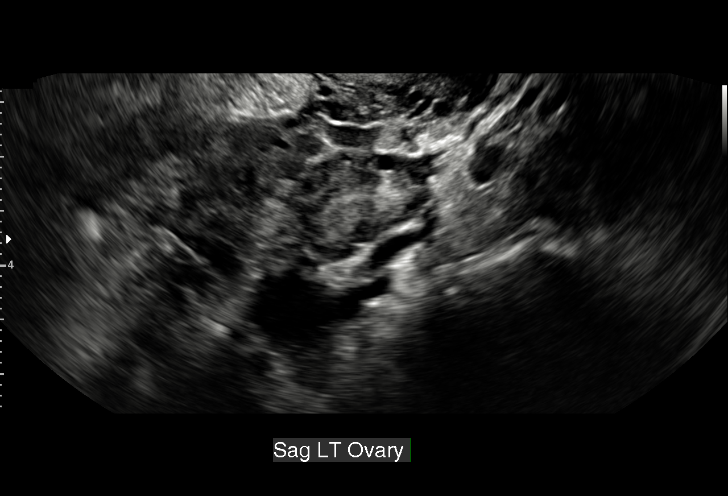
[im 44/57]
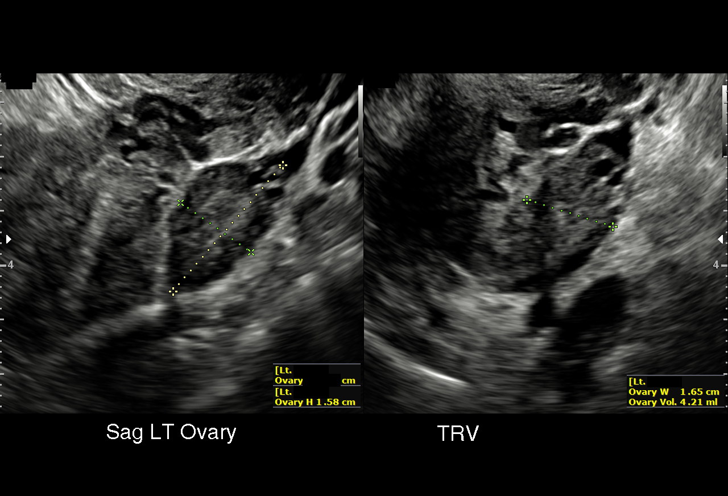
[im 48/57]
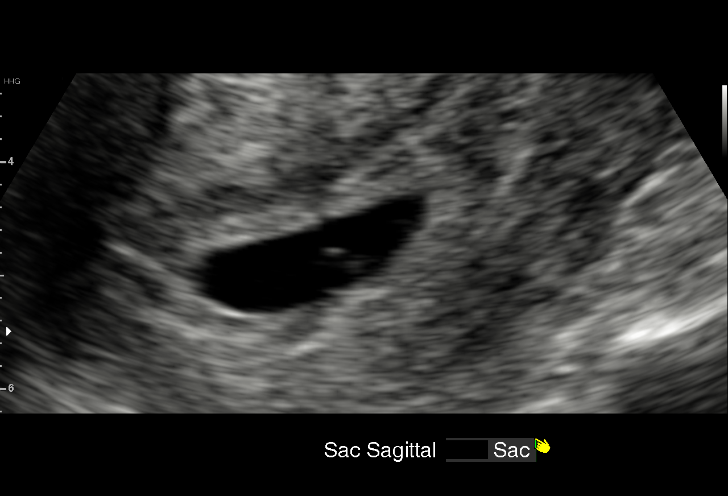
[im 52/57]
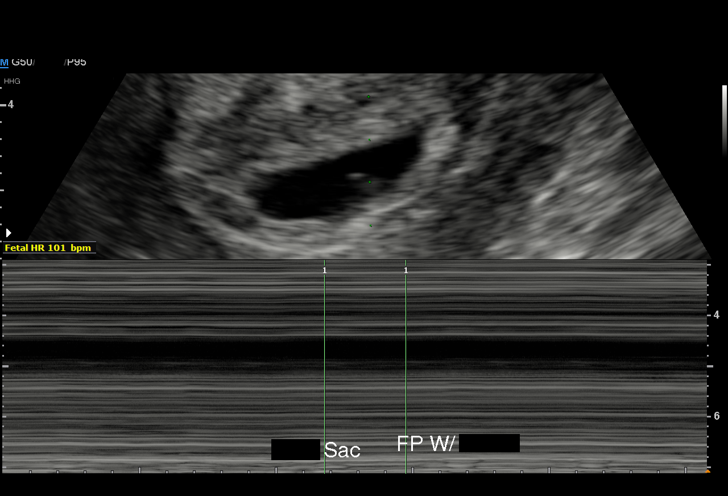
[im 57/57]
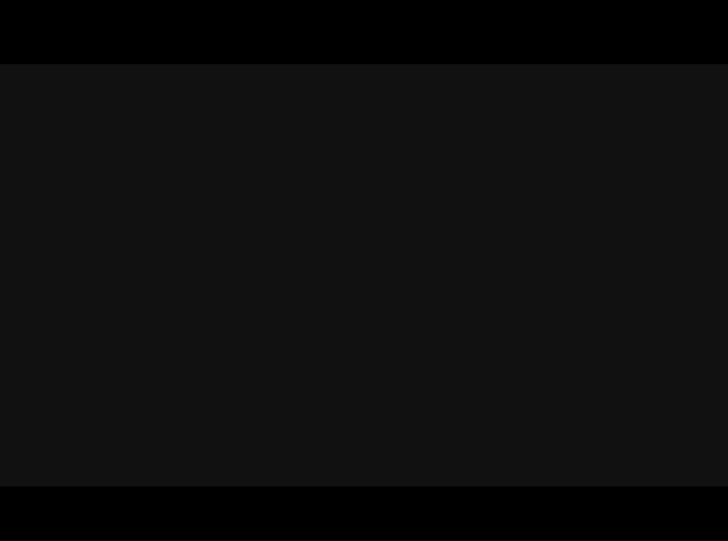

[15 of 28 positions shown; findings below may reference images not displayed]

FINDINGS: Intrauterine gestational sac: Single

Yolk sac:  Yes

Embryo:  Yes

Cardiac Activity: Yes

Heart Rate: 101 bpm

CRL:   2   mm   5 w 5 d                  US EDC: 03/07/2017

Maternal uterus/adnexae:

Subchorionic hemorrhage: None

Right ovary: Appears normal

Left ovary: Appears normal

Other :None

Free fluid:  Small
IMPRESSION: 1. Single living intrauterine gestation with an estimated
gestational age 5 weeks and 5 days.
2. Small amount of free fluid noted, nonspecific.

## 2018-03-20 ENCOUNTER — Emergency Department (HOSPITAL_COMMUNITY): Payer: Medicaid Other

## 2018-03-20 ENCOUNTER — Encounter (HOSPITAL_COMMUNITY): Payer: Self-pay | Admitting: Emergency Medicine

## 2018-03-20 ENCOUNTER — Emergency Department (HOSPITAL_COMMUNITY)
Admission: EM | Admit: 2018-03-20 | Discharge: 2018-03-20 | Disposition: A | Discharge status: Home / Self Care | Payer: Medicaid Other | Attending: Emergency Medicine | Admitting: Emergency Medicine

## 2018-03-20 ENCOUNTER — Other Ambulatory Visit: Payer: Self-pay

## 2018-03-20 DIAGNOSIS — Z87891 Personal history of nicotine dependence: Secondary | ICD-10-CM | POA: Diagnosis not present

## 2018-03-20 DIAGNOSIS — J020 Streptococcal pharyngitis: Secondary | ICD-10-CM | POA: Diagnosis not present

## 2018-03-20 DIAGNOSIS — J029 Acute pharyngitis, unspecified: Secondary | ICD-10-CM | POA: Diagnosis present

## 2018-03-20 LAB — COMPREHENSIVE METABOLIC PANEL
ALBUMIN: 3.5 g/dL (ref 3.5–5.0)
ALK PHOS: 85 U/L (ref 38–126)
ALT: 15 U/L (ref 14–54)
ANION GAP: 6 (ref 5–15)
AST: 20 U/L (ref 15–41)
BUN: 8 mg/dL (ref 6–20)
CALCIUM: 8.7 mg/dL — AB (ref 8.9–10.3)
CO2: 23 mmol/L (ref 22–32)
Chloride: 109 mmol/L (ref 101–111)
Creatinine, Ser: 0.7 mg/dL (ref 0.44–1.00)
GFR calc Af Amer: >60 mL/min (ref >60)
GFR calc non Af Amer: >60 mL/min (ref >60)
GLUCOSE: 97 mg/dL (ref 65–99)
Potassium: 3.9 mmol/L (ref 3.5–5.1)
SODIUM: 138 mmol/L (ref 135–145)
Total Bilirubin: 2.4 mg/dL — ABNORMAL HIGH (ref 0.3–1.2)
Total Protein: 6.9 g/dL (ref 6.5–8.1)

## 2018-03-20 LAB — CBC WITH DIFFERENTIAL/PLATELET
ABS IMMATURE GRANULOCYTES: 0.1 10*3/uL (ref 0.0–0.1)
BASOS PCT: 0 %
Basophils Absolute: 0 10*3/uL (ref 0.0–0.1)
Eosinophils Absolute: 0 10*3/uL (ref 0.0–0.7)
Eosinophils Relative: 0 %
HEMATOCRIT: 39.1 % (ref 36.0–46.0)
Hemoglobin: 13.2 g/dL (ref 12.0–15.0)
Immature Granulocytes: 1 %
LYMPHS ABS: 1.4 10*3/uL (ref 0.7–4.0)
Lymphocytes Relative: 9 %
MCH: 30.8 pg (ref 26.0–34.0)
MCHC: 33.8 g/dL (ref 30.0–36.0)
MCV: 91.1 fL (ref 78.0–100.0)
MONO ABS: 0.7 10*3/uL (ref 0.1–1.0)
MONOS PCT: 5 %
NEUTROS ABS: 12.3 10*3/uL — AB (ref 1.7–7.7)
Neutrophils Relative %: 85 %
PLATELETS: 194 10*3/uL (ref 150–400)
RBC: 4.29 MIL/uL (ref 3.87–5.11)
RDW: 12.1 % (ref 11.5–15.5)
WBC: 14.5 10*3/uL — ABNORMAL HIGH (ref 4.0–10.5)

## 2018-03-20 LAB — URINALYSIS, ROUTINE W REFLEX MICROSCOPIC
Bilirubin Urine: NEGATIVE
GLUCOSE, UA: NEGATIVE mg/dL
HGB URINE DIPSTICK: NEGATIVE
Ketones, ur: NEGATIVE mg/dL
Leukocytes, UA: NEGATIVE
Nitrite: NEGATIVE
PH: 7 (ref 5.0–8.0)
PROTEIN: NEGATIVE mg/dL
SPECIFIC GRAVITY, URINE: 1.023 (ref 1.005–1.030)

## 2018-03-20 LAB — I-STAT BETA HCG BLOOD, ED (MC, WL, AP ONLY): I-stat hCG, quantitative: <5 m[IU]/mL (ref <5)

## 2018-03-20 LAB — I-STAT CG4 LACTIC ACID, ED: Lactic Acid, Venous: 0.69 mmol/L (ref 0.5–1.9)

## 2018-03-20 LAB — GROUP A STREP BY PCR: GROUP A STREP BY PCR: DETECTED — AB

## 2018-03-20 MED ORDER — ONDANSETRON 4 MG PO TBDP
4.0000 mg | ORAL_TABLET | Freq: Once | ORAL | Status: AC
Start: 2018-03-20 — End: 2018-03-20
  Administered 2018-03-20: 4 mg via ORAL
  Filled 2018-03-20: qty 1

## 2018-03-20 MED ORDER — ONDANSETRON 4 MG PO TBDP: 4.0000 mg | ORAL_TABLET | Freq: Three times a day (TID) | ORAL | 0 refills | Status: DC | PRN

## 2018-03-20 MED ORDER — IBUPROFEN 800 MG PO TABS
800.0000 mg | ORAL_TABLET | Freq: Once | ORAL | Status: AC
  Administered 2018-03-20: 800 mg via ORAL
  Filled 2018-03-20: qty 1

## 2018-03-20 MED ORDER — IBUPROFEN 800 MG PO TABS: 800.0000 mg | ORAL_TABLET | Freq: Three times a day (TID) | ORAL | 0 refills | Status: DC

## 2018-03-20 MED ORDER — PENICILLIN G BENZATHINE 1200000 UNIT/2ML IM SUSP
2.4000 10*6.[IU] | Freq: Once | INTRAMUSCULAR | Status: AC
  Administered 2018-03-20: 2.4 10*6.[IU] via INTRAMUSCULAR
  Filled 2018-03-20: qty 4

## 2018-03-20 NOTE — ED Provider Notes (Addendum)
[facility] EMERGENCY DEPARTMENT Provider Note   CSN: [id number] Arrival date & time: 03/20/18  4098     History   Chief Complaint Chief Complaint  Patient presents with  . Cough  . Sore Throat  . Generalized Body Aches    HPI Jennifer Leach is a 37 y.o. female.  Complaint is fever, body aches, sore throat.  HPI 37 year old female.  Symptoms since yesterday.  Fevers body aches sore throat cough.  No nausea vomiting but a poor appetite.  No dysuria frequency.  No shortness of breath.  Past Medical History:  Diagnosis Date  . Bacterial vaginitis 03/04/2017  . Gonorrhea   . Labial swelling 03/04/2017  . MRSA (methicillin resistant staph aureus) culture positive 08/06/2014    Patient Active Problem List   Diagnosis Date Noted  . H/O tubal ligation 03/15/2017  . Bacterial vaginitis 03/04/2017  . Labial swelling 03/04/2017    Past Surgical History:  Procedure Laterality Date  . NO PAST SURGERIES    . TUBAL LIGATION N/A 02/19/2017   Procedure: POST PARTUM TUBAL LIGATION;  Surgeon: [name], MD;  Location: [facility] BIRTHING SUITES;  Service: Gynecology;  Laterality: N/A;  . WISDOM TOOTH EXTRACTION       OB History    Gravida  4   Para  4   Term  4   Preterm      AB  0   Living  4     SAB      TAB  0   Ectopic      Multiple  0   Live Births  4            Home Medications    Prior to Admission medications   Medication Sig Start Date End Date Taking? Authorizing Provider  fluconazole (DIFLUCAN) 150 MG tablet Take 1 tablet (150 mg total) by mouth daily. Take second dose 72 hours later if symptoms still persists. 12/07/17   Cathie Hoops, Amy V, PA-C  ibuprofen (ADVIL,MOTRIN) 800 MG tablet Take 1 tablet (800 mg total) by mouth 3 (three) times daily. 03/20/18   Rolland Porter, MD  ondansetron (ZOFRAN ODT) 4 MG disintegrating tablet Take 1 tablet (4 mg total) by mouth every 8 (eight) hours as needed for nausea. 03/20/18   Rolland Porter, MD     Family History Family History  Problem Relation Age of Onset  . Hypertension Mother     Social History Social History   Tobacco Use  . Smoking status: Former Smoker    Packs/day: 0.25    Years: 9.00    Pack years: 2.25    Types: Cigarettes    Last attempt to quit: 06/09/2012    Years since quitting: 5.7  . Smokeless tobacco: Never Used  Substance Use Topics  . Alcohol use: No  . Drug use: No     Allergies   Septra ds [sulfamethoxazole-trimethoprim]   Review of Systems Review of Systems  Constitutional: Positive for fatigue and fever. Negative for appetite change, chills and diaphoresis.  HENT: Positive for sore throat. Negative for mouth sores and trouble swallowing.   Eyes: Negative for visual disturbance.  Respiratory: Negative for cough, chest tightness, shortness of breath and wheezing.   Cardiovascular: Negative for chest pain.  Gastrointestinal: Negative for abdominal distention, abdominal pain, diarrhea, nausea and vomiting.  Endocrine: Negative for polydipsia, polyphagia and polyuria.  Genitourinary: Negative for dysuria, frequency and hematuria.  Musculoskeletal: Negative for gait problem.  Skin: Negative for color change, pallor  and rash.  Neurological: Negative for dizziness, syncope, light-headedness and headaches.  Hematological: Does not bruise/bleed easily.  Psychiatric/Behavioral: Negative for behavioral problems and confusion.     Physical Exam Updated Vital Signs BP 109/77 (BP Location: Left Arm)   Pulse 77   Temp 98.5 F (36.9 C) (Oral)   Resp 18   Ht 5\' 7"  (1.702 m)   Wt 71.7 kg (158 lb)   LMP 03/10/2018   SpO2 100%   BMI 24.75 kg/m   Physical Exam  Constitutional: She is oriented to person, place, and time. She appears well-developed and well-nourished. No distress.  HENT:  Head: Normocephalic.  Mouth/Throat: Uvula is midline. Oropharyngeal exudate present.  Eyes: Pupils are equal, round, and reactive to light. Conjunctivae are  normal. No scleral icterus.  Neck: Normal range of motion. Neck supple. No thyromegaly present.  Cardiovascular: Normal rate and regular rhythm. Exam reveals no gallop and no friction rub.  No murmur heard. Pulmonary/Chest: Effort normal and breath sounds normal. No respiratory distress. She has no wheezes. She has no rales.  Abdominal: Soft. Bowel sounds are normal. She exhibits no distension. There is no tenderness. There is no rebound.  Musculoskeletal: Normal range of motion.  Neurological: She is alert and oriented to person, place, and time.  Skin: Skin is warm and dry. No rash noted.  Psychiatric: She has a normal mood and affect. Her behavior is normal.     ED Treatments / Results  Labs (all labs ordered are listed, but only abnormal results are displayed) Labs Reviewed  GROUP A STREP BY PCR - Abnormal; Notable for the following components:      Result Value   Group A Strep by PCR DETECTED (*)    All other components within normal limits  COMPREHENSIVE METABOLIC PANEL - Abnormal; Notable for the following components:   Calcium 8.7 (*)    Total Bilirubin 2.4 (*)    All other components within normal limits  CBC WITH DIFFERENTIAL/PLATELET - Abnormal; Notable for the following components:   WBC 14.5 (*)    Neutro Abs 12.3 (*)    All other components within normal limits  URINALYSIS, ROUTINE W REFLEX MICROSCOPIC - Abnormal; Notable for the following components:   APPearance HAZY (*)    All other components within normal limits  I-STAT CG4 LACTIC ACID, ED  I-STAT BETA HCG BLOOD, ED ([facility], [facility], [facility] ONLY)  I-STAT CG4 LACTIC ACID, ED    EKG None  Radiology Dg Chest 2 View  Result Date: 03/20/2018 CLINICAL DATA:  Shortness of breath and cough the past day. EXAM: CHEST - 2 VIEW COMPARISON:  Chest x-ray dated September 04, 2025 CT. FINDINGS: The heart size and mediastinal contours are within normal limits. Both lungs are clear. The visualized skeletal structures are unremarkable.  IMPRESSION: No active cardiopulmonary disease. Electronically Signed   By: Obie Dredge M.D.   On: 03/20/2018 08:57    Procedures Procedures (including critical care time)  Medications Ordered in ED Medications  penicillin g benzathine (BICILLIN LA) 1200000 UNIT/2ML injection 2.4 Million Units (has no administration in time range)  ondansetron (ZOFRAN-ODT) disintegrating tablet 4 mg (has no administration in time range)  ibuprofen (ADVIL,MOTRIN) tablet 800 mg (has no administration in time range)     Initial Impression / Assessment and Plan / ED Course  I have reviewed the triage vital signs and the nursing notes.  Pertinent labs & imaging results that were available during my care of the patient were reviewed by me and  considered in my medical decision making (see chart for details).     EKG Interpretation: SR, slow R progression.  X-ray routine labs per triage show no acute findings.  Group A strep swab positive.  Has elevated Centor score with exudative erythematous enlarged tonsils, anterior cervical lymphadenopathy, fever, petechiae on the palate.  Given IM penicillin.  Discharged home Zofran ibuprofen.  Rest, hydration.  Final Clinical Impressions(s) / ED Diagnoses   Final diagnoses:  Pharyngitis due to Streptococcus species    ED Discharge Orders        Ordered    ondansetron (ZOFRAN ODT) 4 MG disintegrating tablet  Every 8 hours PRN     03/20/18 1152    ibuprofen (ADVIL,MOTRIN) 800 MG tablet  3 times daily     03/20/18 1152       Rolland PorterJames, Bee Marchiano, MD 03/20/18 1155    Rolland PorterJames, Maha Fischel, MD 04/15/18 1311

## 2018-03-20 NOTE — ED Notes (Signed)
Pt verbalized understanding of discharge instructions.

## 2018-03-20 NOTE — ED Triage Notes (Signed)
Pt. Stated, My throat is burning, I have a cough, my body hurts from my head to my toes.

## 2018-03-20 NOTE — Discharge Instructions (Signed)
Zofran for nausea. Ibuprofen for fever/aches

## 2018-04-08 ENCOUNTER — Encounter (HOSPITAL_COMMUNITY): Payer: Self-pay

## 2018-04-08 ENCOUNTER — Ambulatory Visit (HOSPITAL_COMMUNITY)
Admission: EM | Admit: 2018-04-08 | Discharge: 2018-04-08 | Disposition: A | Discharge status: Home / Self Care | Payer: Medicaid Other | Attending: Family Medicine | Admitting: Family Medicine

## 2018-04-08 DIAGNOSIS — N898 Other specified noninflammatory disorders of vagina: Secondary | ICD-10-CM

## 2018-04-08 DIAGNOSIS — N760 Acute vaginitis: Secondary | ICD-10-CM | POA: Insufficient documentation

## 2018-04-08 DIAGNOSIS — B9689 Other specified bacterial agents as the cause of diseases classified elsewhere: Secondary | ICD-10-CM

## 2018-04-08 LAB — POCT URINALYSIS DIP (DEVICE)
Bilirubin Urine: NEGATIVE
GLUCOSE, UA: NEGATIVE mg/dL
Hgb urine dipstick: NEGATIVE
Ketones, ur: NEGATIVE mg/dL
Leukocytes, UA: NEGATIVE
NITRITE: NEGATIVE
PH: 6 (ref 5.0–8.0)
PROTEIN: NEGATIVE mg/dL
Specific Gravity, Urine: >=1.03 (ref 1.005–1.030)
UROBILINOGEN UA: 1 mg/dL (ref 0.0–1.0)

## 2018-04-08 MED ORDER — METRONIDAZOLE 500 MG PO TABS: 500.0000 mg | ORAL_TABLET | Freq: Two times a day (BID) | ORAL | 0 refills | Status: DC

## 2018-04-08 NOTE — ED Provider Notes (Signed)
  MRN: [id number] DOB: 05/05/1981  Subjective:   Jennifer Leach is a 37 y.o. female presenting for 1 week history of vaginal discharge. Had BV in the past, last episode was 2 weeks ago. Denies fever, dysuria, genital rashes, urinary frequency, hematuria, n/v, abdominal pain, pelvic pain. Jennifer Leach is not currently taking any medications.  Does not drink water. Has been tested for STIs in the past, last time was 12/2017 and was negative then but did have positive BV test then as well. She is trying to establish care with another physician to help her manage recurrent BV.   Allergies  Allergen Reactions  . Septra Ds [Sulfamethoxazole-Trimethoprim] Anaphylaxis, Swelling, Rash and Other (See Comments)    Reaction:  Facial swelling     Past Medical History:  Diagnosis Date  . Bacterial vaginitis 03/04/2017  . Gonorrhea   . Labial swelling 03/04/2017  . MRSA (methicillin resistant staph aureus) culture positive 08/06/2014     Past Surgical History:  Procedure Laterality Date  . NO PAST SURGERIES    . TUBAL LIGATION N/A 02/19/2017   Procedure: POST PARTUM TUBAL LIGATION;  Surgeon: Hermina StaggersErvin, Michael L, MD;  [facility];  Service: Gynecology;  Laterality: N/A;  . WISDOM TOOTH EXTRACTION      Objective:   Vitals: BP 118/65 (BP Location: Left Arm)   Pulse 73   Temp 98.1 F (36.7 C) (Temporal)   Resp 20   LMP 03/10/2018   SpO2 100%   Physical Exam  Constitutional: She is oriented to person, place, and time. She appears well-developed and well-nourished.  Cardiovascular: Normal rate.  Pulmonary/Chest: Effort normal.  Neurological: She is alert and oriented to person, place, and time.   Results for orders placed or performed during the hospital encounter of 04/08/18 (from the past 24 hour(s))  POCT urinalysis dip (device)     Status: None   Collection Time: 04/08/18  1:12 PM  Result Value Ref Range   Glucose, UA NEGATIVE NEGATIVE mg/dL   Bilirubin Urine NEGATIVE NEGATIVE    Ketones, ur NEGATIVE NEGATIVE mg/dL   Specific Gravity, Urine >=1.030 1.005 - 1.030   Hgb urine dipstick NEGATIVE NEGATIVE   pH 6.0 5.0 - 8.0   Protein, ur NEGATIVE NEGATIVE mg/dL   Urobilinogen, UA 1.0 0.0 - 1.0 mg/dL   Nitrite NEGATIVE NEGATIVE   Leukocytes, UA NEGATIVE NEGATIVE   Assessment and Plan :   BV (bacterial vaginosis)  Vaginal discharge  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 Patient was okay with retesting for STIs. Labs pending.   Wallis BambergMani, Nikai Quest, [place]PA-C 04/08/18 1349

## 2018-04-08 NOTE — ED Triage Notes (Signed)
Pt presents with symptoms of BV.

## 2018-04-09 LAB — URINE CYTOLOGY ANCILLARY ONLY
Chlamydia: NEGATIVE
Neisseria Gonorrhea: NEGATIVE
TRICH (WINDOWPATH): NEGATIVE

## 2018-04-10 LAB — URINE CYTOLOGY ANCILLARY ONLY: Candida vaginitis: NEGATIVE

## 2018-04-12 ENCOUNTER — Telehealth (HOSPITAL_COMMUNITY): Payer: Self-pay

## 2018-04-12 NOTE — Telephone Encounter (Signed)
Bacterial Vaginosis test is positive.  Prescription for metronidazole was given at the urgent care visit. Attempted to reach patient. No answer at this time. 

## 2018-04-30 IMAGING — US US MFM OB COMP +14 WKS
1 series · 14 of 28 positions shown · non-contrast
Comparison: none

[Series 1: us mfm ob comp +14 wks · 14 of 78 slices shown]
[im 3/78]
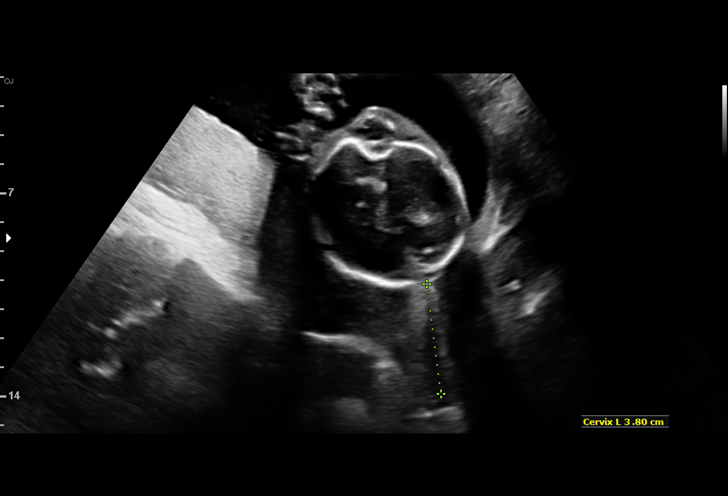
[im 9/78]
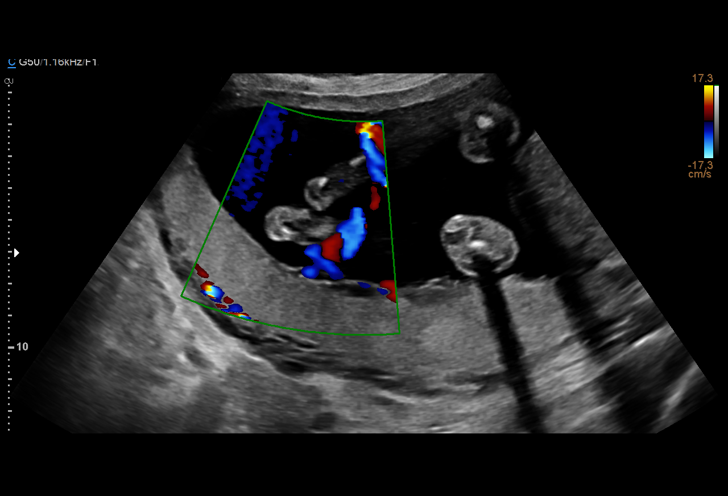
[im 15/78]
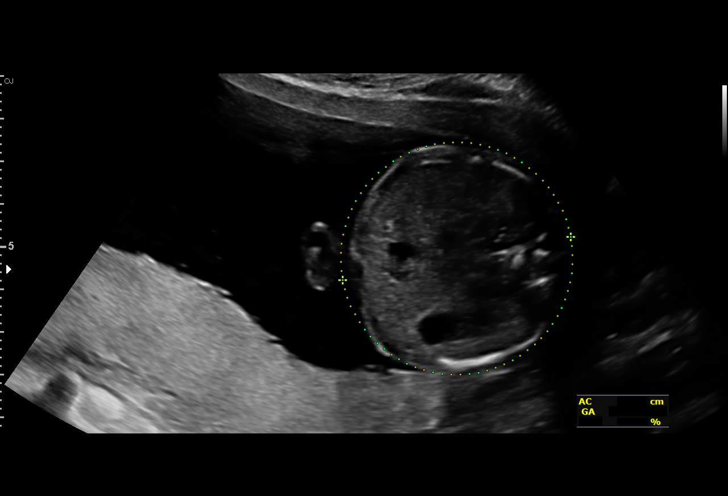
[im 20/78]
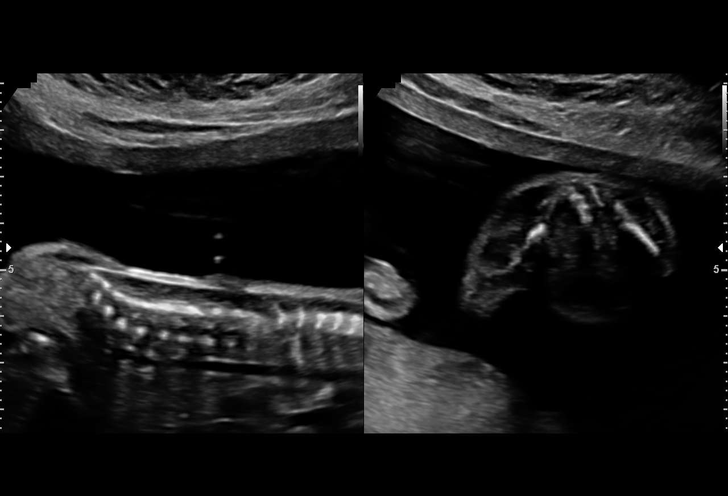
[im 26/78]
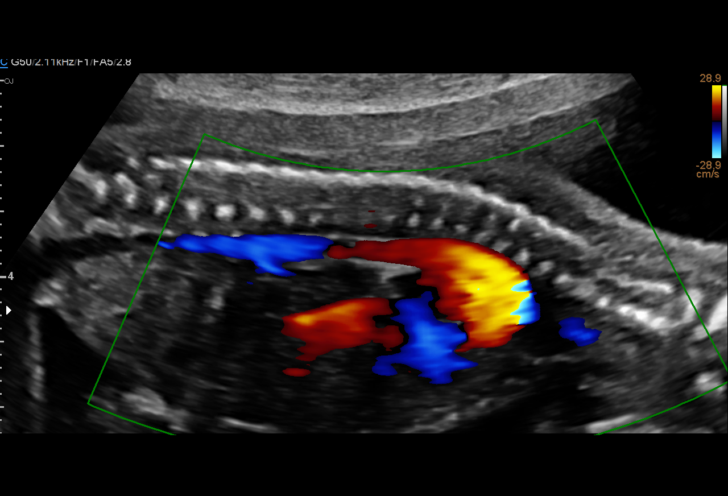
[im 32/78]
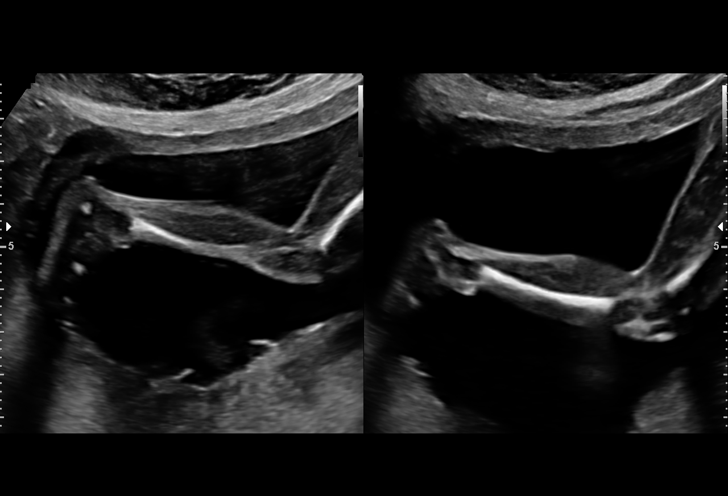
[im 38/78]
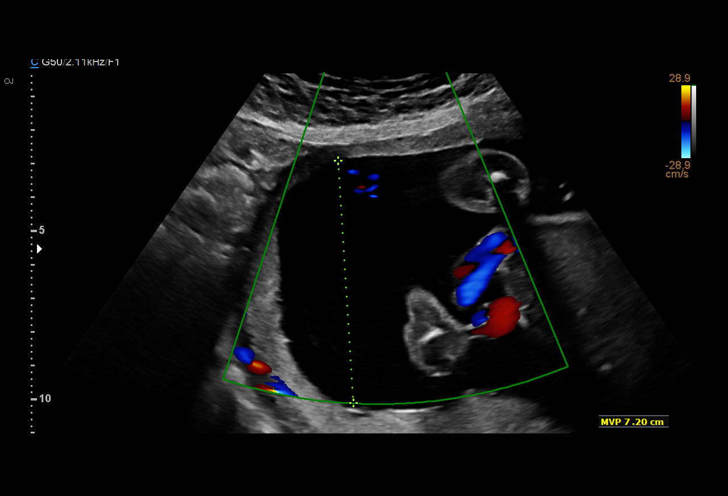
[im 43/78]
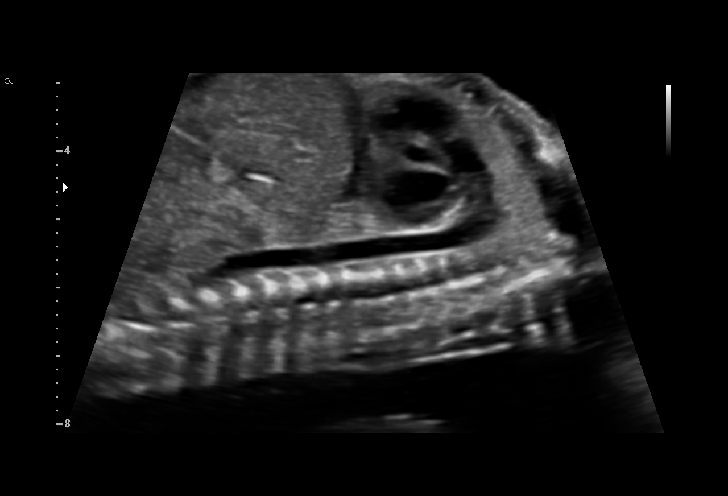
[im 49/78]
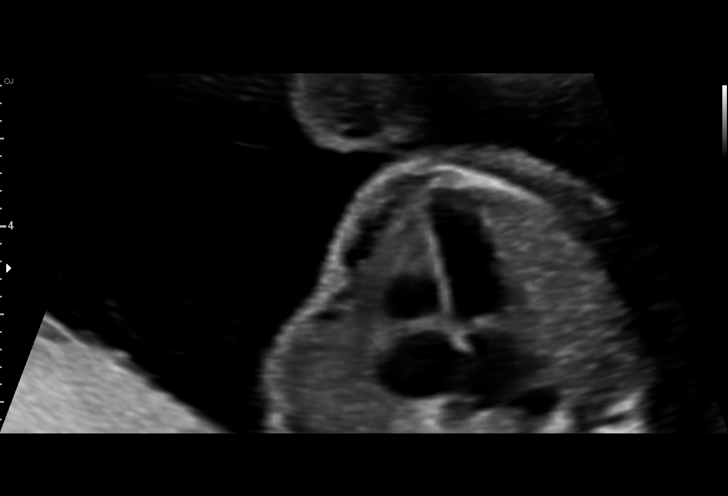
[im 55/78]
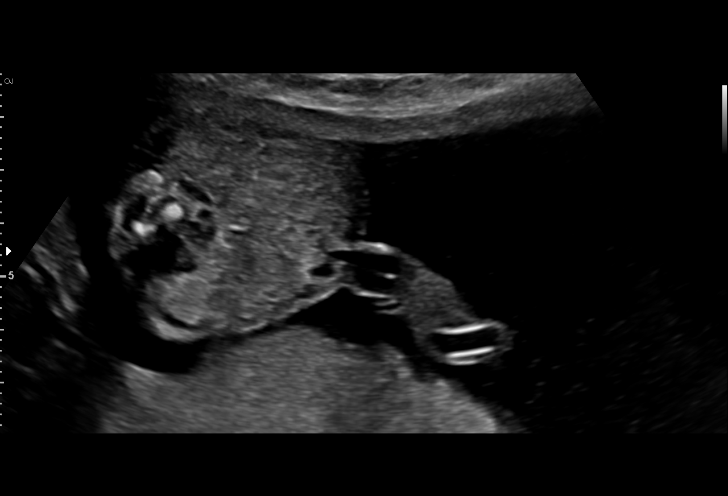
[im 60/78]
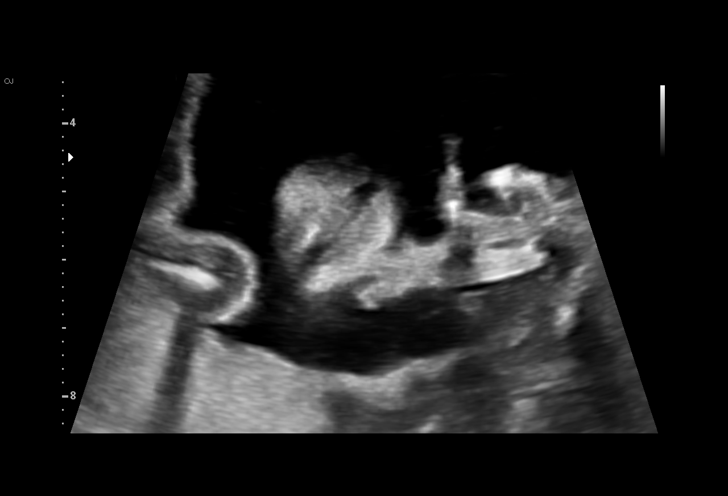
[im 66/78]
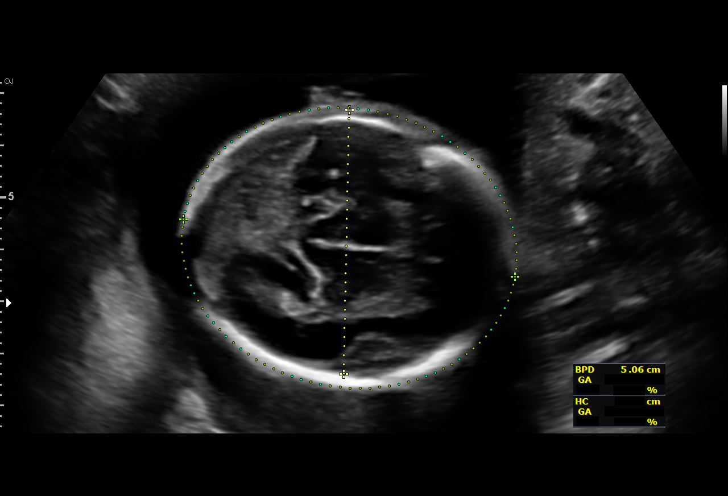
[im 72/78]
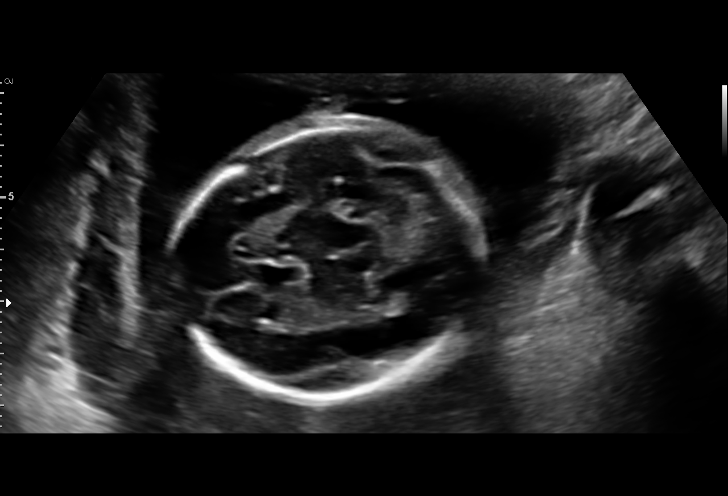
[im 78/78]
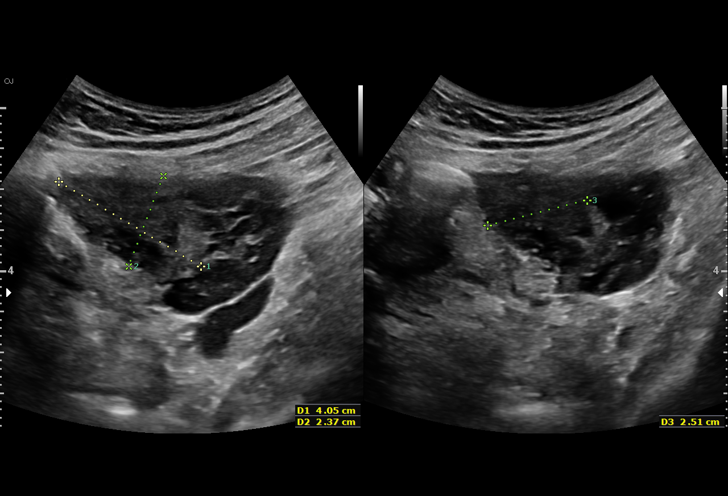

[14 of 28 positions shown; findings below may reference images not displayed]

1  SYLVESTRE BONVALET            [id number]      [id number]     [id number]
Indications

20 weeks gestation of pregnancy
Encounter for antenatal screening for
malformations
OB History

Gravidity:    4         Term:   3
Living:       3
Fetal Evaluation

Num Of Fetuses:     1
Fetal Heart         150
Rate(bpm):
Cardiac Activity:   Observed
Presentation:       Cephalic
Placenta:           Posterior, above cervical os
P. Cord Insertion:  Visualized

Amniotic Fluid
AFI FV:      Subjectively increased

Largest Pocket(cm)
7.2

RUQ(cm)
7.2
Biometry

BPD:      51.4  mm     G. Age:  21w 4d         82  %    CI:        77.97   %    70 - 86
FL/HC:      18.8   %    15.9 -
HC:      184.2  mm     G. Age:  20w 6d         45  %    HC/AC:      1.08        1.06 -
AC:      171.1  mm     G. Age:  22w 1d         83  %    FL/BPD:     67.5   %
FL:       34.7  mm     G. Age:  20w 6d         49  %    FL/AC:      20.3   %    20 - 24
HUM:      33.1  mm     G. Age:  21w 1d         61  %
CER:      21.6  mm     G. Age:  20w 4d         47  %
CM:        3.8  mm

Est. FW:     428  gm    0 lb 15 oz      57  %
Gestational Age

U/S Today:     21w 3d                                        EDD:   03/02/17
Best:          20w 5d     Det. By:  Early Ultrasound         EDD:   03/07/17
(07/10/16)
Anatomy

Cranium:               Appears normal         Aortic Arch:            Appears normal
Cavum:                 Not well visualized    Ductal Arch:            Appears normal
Ventricles:            Appears normal         Diaphragm:              Appears normal
Choroid Plexus:        Appears normal         Stomach:                Appears normal, left
sided
Cerebellum:            Appears normal         Abdomen:                Appears normal
Posterior Fossa:       Appears normal         Abdominal Wall:         Appears nml (cord
insert, abd wall)
Nuchal Fold:           Not applicable (>20    Cord Vessels:           Appears normal (3
wks GA)                                        vessel cord)
Face:                  Orbits nl; profile not Kidneys:                Appear normal
well visualized
Lips:                  Appears normal         Bladder:                Appears normal
Thoracic:              Appears normal         Spine:                  Appears normal
Heart:                 Appears normal         Upper Extremities:      Appears normal
(4CH, axis, and situs
RVOT:                  Appears normal         Lower Extremities:      Appears normal
LVOT:                  Appears normal

Other:  Fetus appears to be a female. Heels and 5th digit visualized.
Technically difficult due to fetal position.
Cervix Uterus Adnexa

Cervix
Length:            3.8  cm.
Normal appearance by transabdominal scan.

Uterus
No abnormality visualized.

Left Ovary
Within normal limits.

Right Ovary
Within normal limits.
Impression

Singleton intrauterine pregnancy at 20+5 weeks, here for
anatomic survey
Review of the anatomy shows no sonographic markers for
aneuploidy or structural anomalies
Amniotic fluid volume is normal with a MVP of 7.4 cm, but
subjectively the level is high
Estimated fetal weight is 428g which is growth in the 57th
percentile
Recommendations

Consider repeat scan in 6 weeks to assess growth and
amniotic fluid levels

## 2018-05-28 ENCOUNTER — Ambulatory Visit (HOSPITAL_COMMUNITY)
Admission: EM | Admit: 2018-05-28 | Discharge: 2018-05-28 | Disposition: A | Discharge status: Home / Self Care | Payer: Self-pay | Attending: Physician Assistant | Admitting: Physician Assistant

## 2018-05-28 ENCOUNTER — Encounter (HOSPITAL_COMMUNITY): Payer: Self-pay | Admitting: Emergency Medicine

## 2018-05-28 DIAGNOSIS — N898 Other specified noninflammatory disorders of vagina: Secondary | ICD-10-CM | POA: Insufficient documentation

## 2018-05-28 DIAGNOSIS — Z882 Allergy status to sulfonamides status: Secondary | ICD-10-CM | POA: Insufficient documentation

## 2018-05-28 DIAGNOSIS — Z9851 Tubal ligation status: Secondary | ICD-10-CM | POA: Insufficient documentation

## 2018-05-28 DIAGNOSIS — Z87891 Personal history of nicotine dependence: Secondary | ICD-10-CM | POA: Insufficient documentation

## 2018-05-28 MED ORDER — METRONIDAZOLE 0.75 % VA GEL: 1.0000 | Freq: Every day | VAGINAL | 0 refills | Status: AC

## 2018-05-28 MED ORDER — METRONIDAZOLE 500 MG PO TABS: 500.0000 mg | ORAL_TABLET | Freq: Two times a day (BID) | ORAL | 0 refills | Status: DC

## 2018-05-28 MED ORDER — FLUCONAZOLE 150 MG PO TABS: 150.0000 mg | ORAL_TABLET | Freq: Every day | ORAL | 0 refills | Status: DC

## 2018-05-28 NOTE — ED Triage Notes (Signed)
PT reports frequent BV. PT has had discharge for 3-4 weeks

## 2018-05-28 NOTE — ED Provider Notes (Signed)
[facility]    CSN: [id number] Arrival date & time: 05/28/18  1934     History   Chief Complaint Chief Complaint  Patient presents with  . Vaginal Discharge    HPI Jennifer Leach is a 37 y.o. female.   37 year old female comes in for 3 to 4-week history of vaginal discharge with odor.  She gets frequent BV, and has noticed that this usually starts after her cycle.  She has had some abdominal cramping without nausea, vomiting.  Denies fever, chills, night sweats.  Denies urinary symptoms such as frequency, dysuria, hematuria.  Sexually active with one female partner, no condom use.  History of tubal ligation.  LMP 05/12/2018.     Past Medical History:  Diagnosis Date  . Bacterial vaginitis 03/04/2017  . Gonorrhea   . Labial swelling 03/04/2017  . MRSA (methicillin resistant staph aureus) culture positive 08/06/2014    Patient Active Problem List   Diagnosis Date Noted  . H/O tubal ligation 03/15/2017  . Bacterial vaginitis 03/04/2017  . Labial swelling 03/04/2017    Past Surgical History:  Procedure Laterality Date  . NO PAST SURGERIES    . TUBAL LIGATION N/A 02/19/2017   Procedure: POST PARTUM TUBAL LIGATION;  Surgeon: Hermina StaggersErvin, Michael L, MD;  [facility];  Service: Gynecology;  Laterality: N/A;  . WISDOM TOOTH EXTRACTION      OB History    Gravida  4   Para  4   Term  4   Preterm      AB  0   Living  4     SAB      TAB  0   Ectopic      Multiple  0   Live Births  4            Home Medications    Prior to Admission medications   Medication Sig Start Date End Date Taking? Authorizing Provider  fluconazole (DIFLUCAN) 150 MG tablet Take 1 tablet (150 mg total) by mouth daily. Take second dose 72 hours later if symptoms still persists. 05/28/18   Cathie HoopsYu, Amy V, PA-C  metroNIDAZOLE (FLAGYL) 500 MG tablet Take 1 tablet (500 mg total) by mouth 2 (two) times daily with a meal. DO NOT CONSUME ALCOHOL WHILE TAKING THIS  MEDICATION. 05/28/18   Cathie HoopsYu, Amy V, PA-C  metroNIDAZOLE (METROGEL VAGINAL) 0.75 % vaginal gel Place 1 Applicatorful vaginally at bedtime for 5 days. 05/28/18 06/02/18  Belinda FisherYu, Amy V, PA-C    Family History Family History  Problem Relation Age of Onset  . Hypertension Mother     Social History Social History   Tobacco Use  . Smoking status: Former Smoker    Packs/day: 0.25    Years: 9.00    Pack years: 2.25    Types: Cigarettes    Last attempt to quit: 06/09/2012    Years since quitting: 5.9  . Smokeless tobacco: Never Used  Substance Use Topics  . Alcohol use: No  . Drug use: No     Allergies   Septra ds [sulfamethoxazole-trimethoprim]   Review of Systems Review of Systems  Reason unable to perform ROS: See HPI as above.     Physical Exam Triage Vital Signs ED Triage Vitals  Enc Vitals Group     BP 05/28/18 1945 130/88     Pulse Rate 05/28/18 1945 78     Resp 05/28/18 1945 16     Temp 05/28/18 1945 97.7 F (36.5 C)  Temp Source 05/28/18 1945 Temporal     SpO2 05/28/18 1945 100 %     Weight --      Height --      Head Circumference --      Peak Flow --      Pain Score 05/28/18 1944 4     Pain Loc --      Pain Edu? --      Excl. in GC? --    No data found.  Updated Vital Signs BP 130/88   Pulse 78   Temp 97.7 F (36.5 C) (Temporal)   Resp 16   LMP 05/12/2018   SpO2 100%   Physical Exam  Constitutional: She is oriented to person, place, and time. She appears well-developed and well-nourished. No distress.  HENT:  Head: Normocephalic and atraumatic.  Eyes: Pupils are equal, round, and reactive to light. Conjunctivae are normal.  Cardiovascular: Normal rate, regular rhythm and normal heart sounds. Exam reveals no gallop and no friction rub.  No murmur heard. Pulmonary/Chest: Effort normal and breath sounds normal. She has no wheezes. She has no rales.  Abdominal: Soft. Bowel sounds are normal. She exhibits no mass. There is no tenderness. There is no  rebound, no guarding and no CVA tenderness.  Neurological: She is alert and oriented to person, place, and time.  Skin: Skin is warm and dry.  Psychiatric: She has a normal mood and affect. Her behavior is normal. Judgment normal.   UC Treatments / Results  Labs (all labs ordered are listed, but only abnormal results are displayed) Labs Reviewed  CERVICOVAGINAL ANCILLARY ONLY    EKG None  Radiology No results found.  Procedures Procedures (including critical care time)  Medications Ordered in UC Medications - No data to display  Initial Impression / Assessment and Plan / UC Course  I have reviewed the triage vital signs and the nursing notes.  Pertinent labs & imaging results that were available during my care of the patient were reviewed by me and considered in my medical decision making (see chart for details).    Patient was treated empirically for BV and yeast.  Diflucan and Flagyl as directed.  Patient would like MetroGel, but unsure if she can afford cost.  Will have Flagyl and Medrol gel called into pharmacy, patient can start taking one depending on cost.  Cytology sent, patient will be contacted with any positive results that require additional treatment. Patient to refrain from sexual activity for the next 7 days. Return precautions given.   Final Clinical Impressions(s) / UC Diagnoses   Final diagnoses:  Vaginal discharge    ED Prescriptions    Medication Sig Dispense Auth. Provider   metroNIDAZOLE (FLAGYL) 500 MG tablet Take 1 tablet (500 mg total) by mouth 2 (two) times daily with a meal. DO NOT CONSUME ALCOHOL WHILE TAKING THIS MEDICATION. 14 tablet Yu, Amy V, PA-C   fluconazole (DIFLUCAN) 150 MG tablet Take 1 tablet (150 mg total) by mouth daily. Take second dose 72 hours later if symptoms still persists. 2 tablet Yu, Amy V, PA-C   metroNIDAZOLE (METROGEL VAGINAL) 0.75 % vaginal gel Place 1 Applicatorful vaginally at bedtime for 5 days. 50 g Threasa AlphaYu, Amy V, PA-C         Yu, Amy V, [place]-C 05/28/18 [phone]

## 2018-05-28 NOTE — Discharge Instructions (Signed)
You were treated empirically for BV and yeast. Start diflucan and flagyl as directed. Cytology sent, you will be contacted with any positive results that requires further treatment. Refrain from sexual activity and alcohol use for the next 7 days. Monitor for any worsening of symptoms, fever, abdominal pain, nausea, vomiting, to follow up for reevaluation. ° °

## 2018-05-29 LAB — CERVICOVAGINAL ANCILLARY ONLY
Chlamydia: NEGATIVE
Neisseria Gonorrhea: NEGATIVE
Trichomonas: NEGATIVE

## 2018-08-17 IMAGING — US US MFM OB LIMITED
1 series · 15 of 28 positions shown · non-contrast
Comparison: none

[Series 1: us mfm ob limited · 31 acquisitions, 15 frames shown]
[im 1/31]
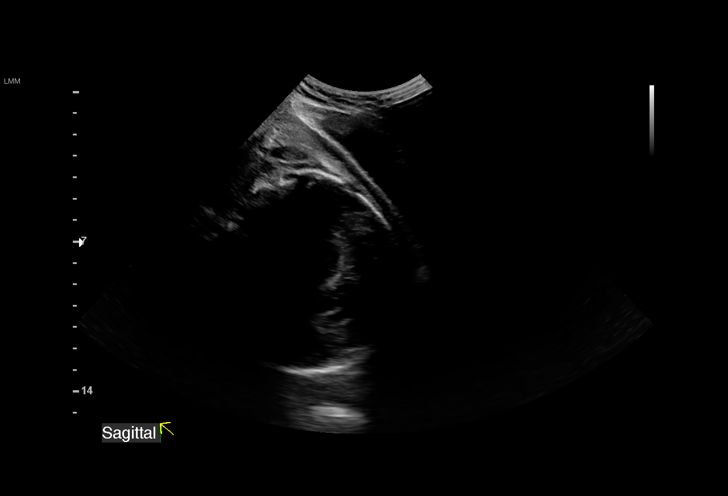
[im 3/31]
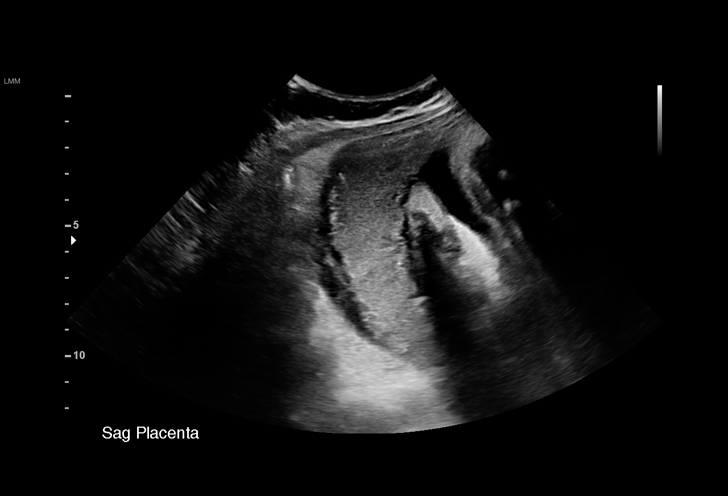
[im 5/31]
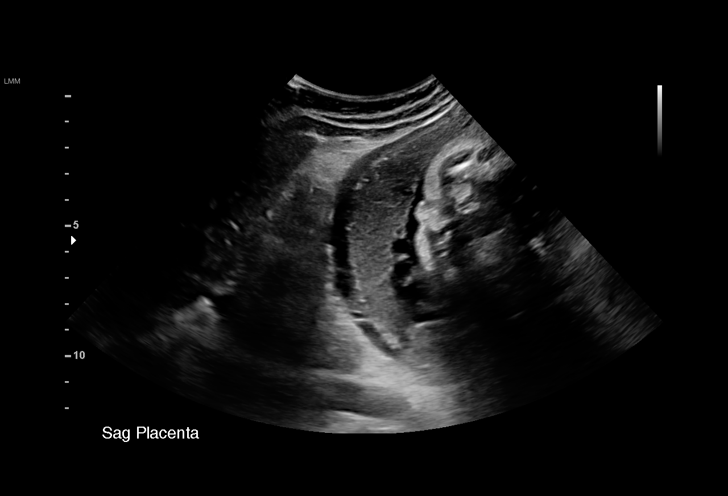
[im 7/31]
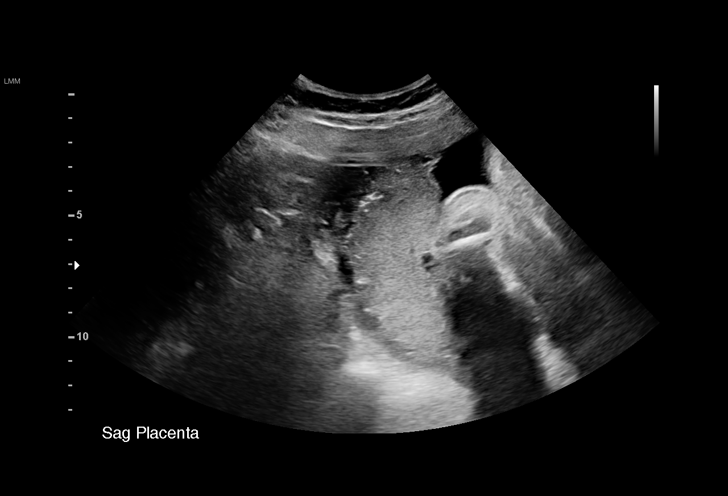
[im 9/31]
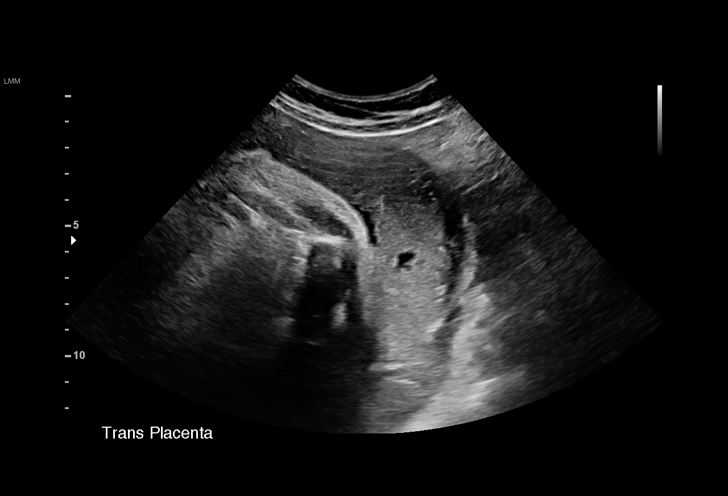
[im 12/31]
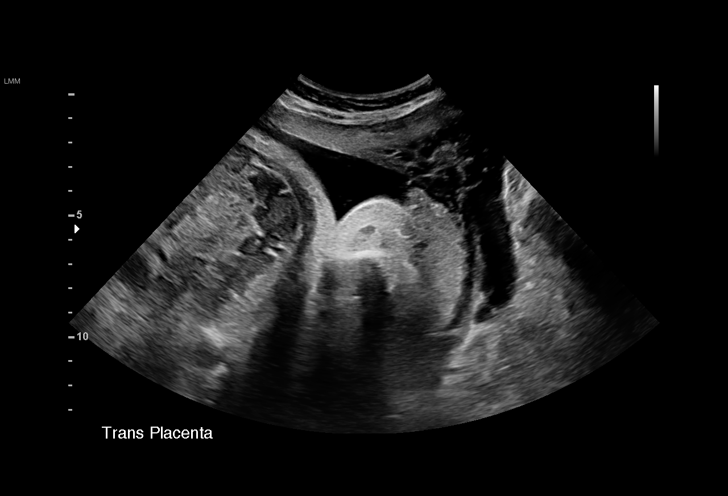
[im 14/31]
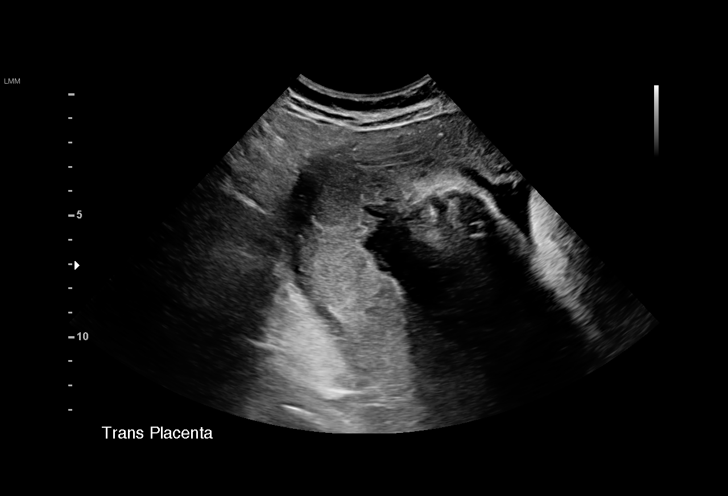
[im 16/31]
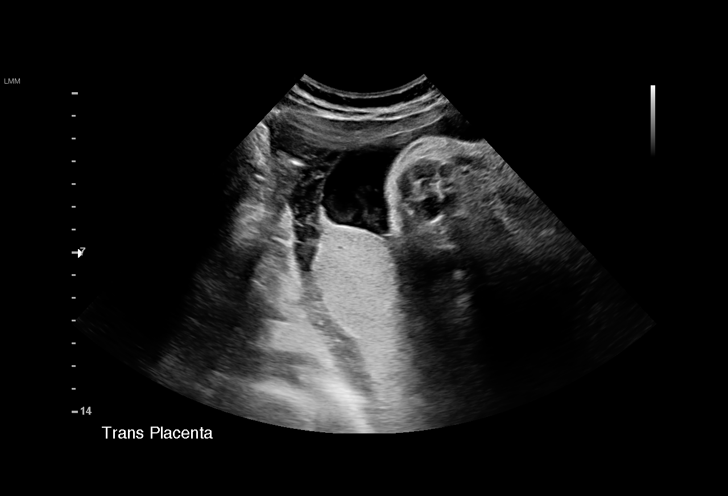
[im 17/31]
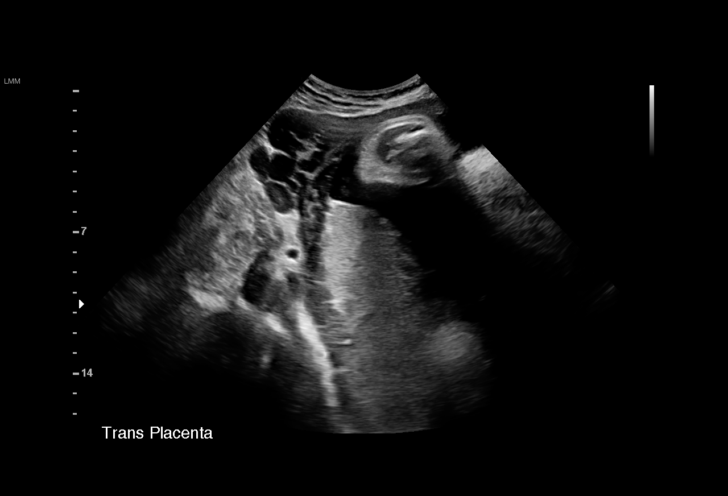
[im 19/31]
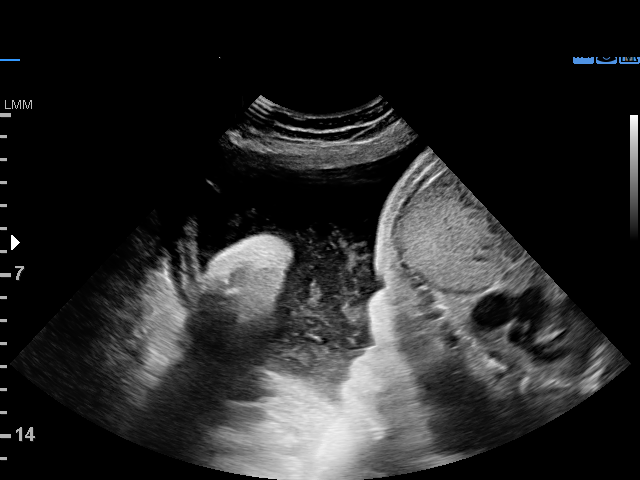
[im 22/31]
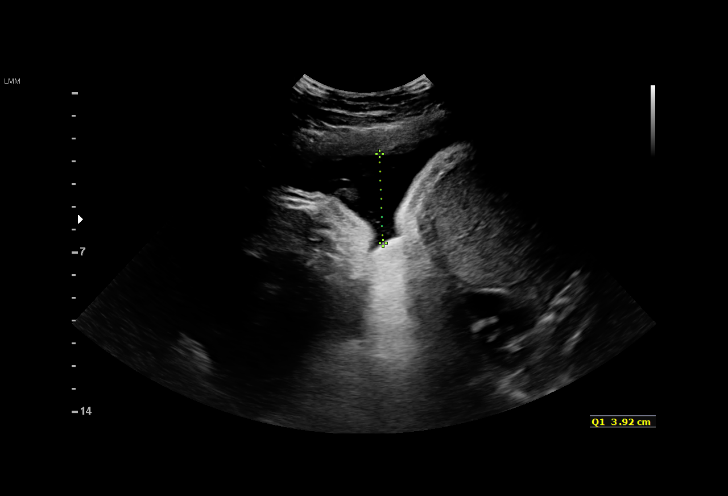
[im 24/31]
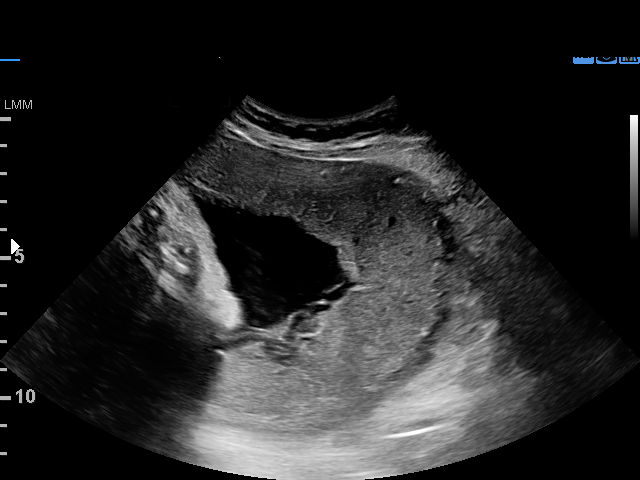
[im 26/31]
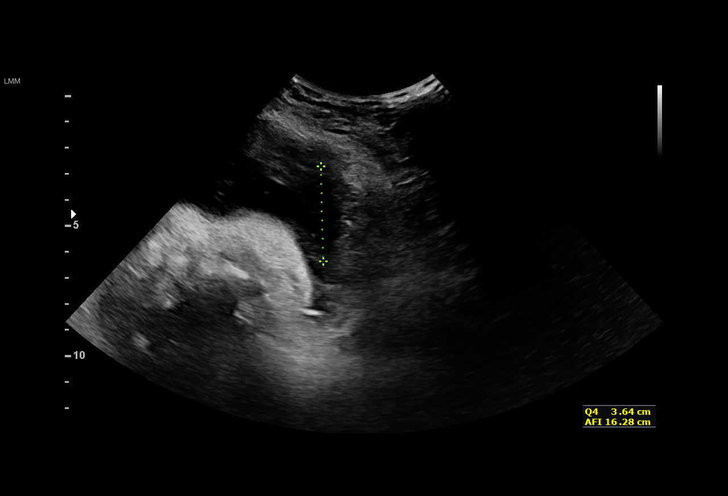
[im 28/31]
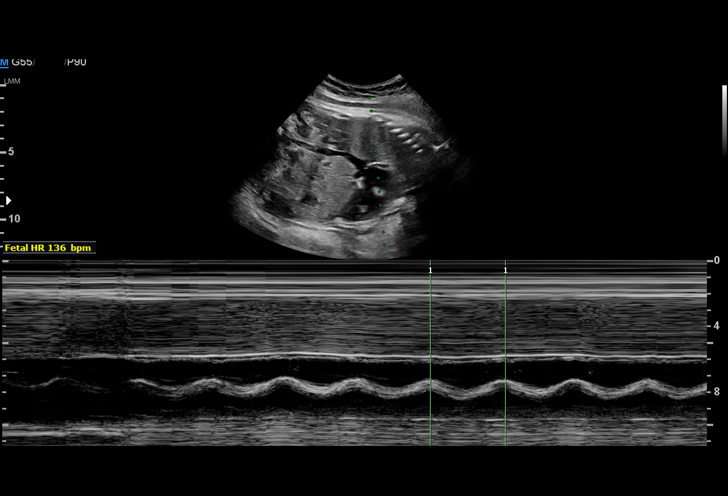
[im 31/31]
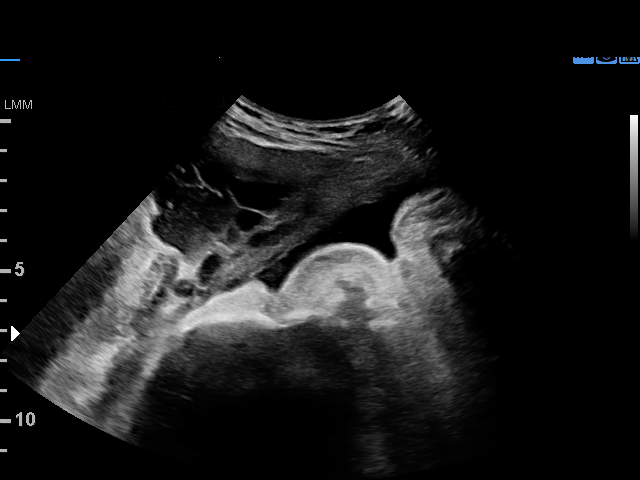

[15 of 28 positions shown; findings below may reference images not displayed]

MAU/Triage
[facility] CN[facility]

1  DOJY GATDULA          [id number]      [id number]     [id number]
Indications

36 weeks gestation of pregnancy
Traumatic injury during pregnancy (fall)
Abdominal pain in pregnancy
OB History

Gravidity:    4         Term:   3
Living:       3
Fetal Evaluation

Num Of Fetuses:     1
Fetal Heart         136
Rate(bpm):
Cardiac Activity:   Observed
Presentation:       Cephalic
Placenta:           Fundal, above cervical os

Amniotic Fluid
AFI FV:      Subjectively within normal limits

AFI Sum(cm)     %Tile       Largest Pocket(cm)
16.28           60

RUQ(cm)       RLQ(cm)       LUQ(cm)        LLQ(cm)
3.92
Gestational Age

Best:          36w 2d    Det. By:   Early Ultrasound         EDD:   03/07/17
(07/10/16)
Impression

IUP at 36+2 weeks, S/P fall
Normal fetal movement and cardiac activity
Normal placentation
Normal amniotic fluid levels. There is some flocculent
material in the amniotic fluid consistent with vernix or possible
fresh bleeding (not clot)
Recommendations

No evidence of fetal compromise
Continue clinical evaluation and management
Consider a prolonged monitoring window (2-3 hours) to rule
out ongoing blood loss

## 2018-09-04 ENCOUNTER — Other Ambulatory Visit: Payer: Self-pay

## 2018-09-04 ENCOUNTER — Ambulatory Visit (HOSPITAL_COMMUNITY)
Admission: EM | Admit: 2018-09-04 | Discharge: 2018-09-04 | Disposition: A | Discharge status: Home / Self Care | Payer: Medicaid Other | Attending: Family Medicine | Admitting: Family Medicine

## 2018-09-04 ENCOUNTER — Encounter (HOSPITAL_COMMUNITY): Payer: Self-pay

## 2018-09-04 DIAGNOSIS — A749 Chlamydial infection, unspecified: Secondary | ICD-10-CM

## 2018-09-04 MED ORDER — AZITHROMYCIN 250 MG PO TABS
ORAL_TABLET | ORAL | Status: AC
  Filled 2018-09-04: qty 4

## 2018-09-04 MED ORDER — AZITHROMYCIN 250 MG PO TABS
1000.0000 mg | ORAL_TABLET | Freq: Once | ORAL | Status: AC
  Administered 2018-09-04: 1000 mg via ORAL

## 2018-09-04 NOTE — Discharge Instructions (Signed)
You were treated for chlamydia. Azithromycin 1g by mouth given in office today. Refrain from sexual activity and alcohol use for the next 7 days. Monitor for any worsening of symptoms, fever, abdominal pain, nausea, vomiting, to follow up for reevaluation.

## 2018-09-04 NOTE — ED Triage Notes (Signed)
Pt states she got a call from  [facility] and was told to come get treated for chlamydemia .

## 2018-09-04 NOTE — ED Provider Notes (Signed)
[facility]    CSN: [id number] Arrival date & time: 09/04/18  1257     History   Chief Complaint Chief Complaint  Patient presents with  . SEXUALLY TRANSMITTED DISEASE    HPI Jennifer Leach is a 37 y.o. female.   37 year old female comes in for chlamydia treatment.  States went to the health department for STD testing, and got results today with positive chlamydia.  She denies fever, chills, night sweats.  Has had mild suprapubic abdominal cramping.  Denies nausea, vomiting.  Denies urinary symptoms such as frequency, dysuria, hematuria.  Denies vaginal discharge, itching, pain.  States found out her partner has multiple partners.     Past Medical History:  Diagnosis Date  . Bacterial vaginitis 03/04/2017  . Gonorrhea   . Labial swelling 03/04/2017  . MRSA (methicillin resistant staph aureus) culture positive 08/06/2014    Patient Active Problem List   Diagnosis Date Noted  . H/O tubal ligation 03/15/2017  . Bacterial vaginitis 03/04/2017  . Labial swelling 03/04/2017    Past Surgical History:  Procedure Laterality Date  . NO PAST SURGERIES    . TUBAL LIGATION N/A 02/19/2017   Procedure: POST PARTUM TUBAL LIGATION;  Surgeon: Hermina StaggersErvin, Michael L, MD;  [facility];  Service: Gynecology;  Laterality: N/A;  . WISDOM TOOTH EXTRACTION      OB History    Gravida  4   Para  4   Term  4   Preterm      AB  0   Living  4     SAB      TAB  0   Ectopic      Multiple  0   Live Births  4            Home Medications    Prior to Admission medications   Medication Sig Start Date End Date Taking? Authorizing Provider  fluconazole (DIFLUCAN) 150 MG tablet Take 1 tablet (150 mg total) by mouth daily. Take second dose 72 hours later if symptoms still persists. 05/28/18   Cathie HoopsYu, Amy V, PA-C  metroNIDAZOLE (FLAGYL) 500 MG tablet Take 1 tablet (500 mg total) by mouth 2 (two) times daily with a meal. DO NOT CONSUME ALCOHOL WHILE TAKING THIS  MEDICATION. 05/28/18   Belinda FisherYu, Amy V, PA-C    Family History Family History  Problem Relation Age of Onset  . Hypertension Mother     Social History Social History   Tobacco Use  . Smoking status: Former Smoker    Packs/day: 0.25    Years: 9.00    Pack years: 2.25    Types: Cigarettes    Last attempt to quit: 06/09/2012    Years since quitting: 6.2  . Smokeless tobacco: Never Used  Substance Use Topics  . Alcohol use: No  . Drug use: No     Allergies   Septra ds [sulfamethoxazole-trimethoprim]   Review of Systems Review of Systems  Reason unable to perform ROS: See HPI as above.     Physical Exam Triage Vital Signs ED Triage Vitals  Enc Vitals Group     BP 09/04/18 1321 117/87     Pulse Rate 09/04/18 1321 71     Resp 09/04/18 1321 18     Temp 09/04/18 1321 98.6 F (37 C)     Temp Source 09/04/18 1321 Oral     SpO2 09/04/18 1321 100 %     Weight 09/04/18 1320 159 lb (72.1 kg)  Height --      Head Circumference --      Peak Flow --      Pain Score 09/04/18 1319 6     Pain Loc --      Pain Edu? --      Excl. in GC? --    No data found.  Updated Vital Signs BP 117/87 (BP Location: Left Arm)   Pulse 71   Temp 98.6 F (37 C) (Oral)   Resp 18   Wt 159 lb (72.1 kg)   LMP 08/28/2018   SpO2 100%   BMI 24.90 kg/m   Visual Acuity Right Eye Distance:   Left Eye Distance:   Bilateral Distance:    Right Eye Near:   Left Eye Near:    Bilateral Near:     Physical Exam  Constitutional: She is oriented to person, place, and time. She appears well-developed and well-nourished. No distress.  HENT:  Head: Normocephalic and atraumatic.  Eyes: Pupils are equal, round, and reactive to light. Conjunctivae are normal.  Cardiovascular: Normal rate, regular rhythm and normal heart sounds. Exam reveals no gallop and no friction rub.  No murmur heard. Pulmonary/Chest: Effort normal and breath sounds normal. She has no wheezes. She has no rales.  Abdominal: Soft.  Bowel sounds are normal. She exhibits no mass. There is no tenderness. There is no rebound, no guarding and no CVA tenderness.  Neurological: She is alert and oriented to person, place, and time.  Skin: Skin is warm and dry.  Psychiatric: She has a normal mood and affect. Her behavior is normal. Judgment normal.     UC Treatments / Results  Labs (all labs ordered are listed, but only abnormal results are displayed) Labs Reviewed - No data to display  EKG None  Radiology No results found.  Procedures Procedures (including critical care time)  Medications Ordered in UC Medications  azithromycin (ZITHROMAX) tablet 1,000 mg (1,000 mg Oral Given 09/04/18 1401)    Initial Impression / Assessment and Plan / UC Course  I have reviewed the triage vital signs and the nursing notes.  Pertinent labs & imaging results that were available during my care of the patient were reviewed by me and considered in my medical decision making (see chart for details).    Patient treated for chlamydia with azithromycin.  Refrain from sexual activity for the next 7 days.  Return precautions given.  Final Clinical Impressions(s) / UC Diagnoses   Final diagnoses:  Chlamydia    ED Prescriptions    None        Belinda Fisher, PA-C 09/04/18 1417

## 2018-10-15 ENCOUNTER — Ambulatory Visit: Payer: Medicaid Other | Admitting: Advanced Practice Midwife

## 2018-11-18 ENCOUNTER — Telehealth: Payer: Self-pay | Admitting: Advanced Practice Midwife

## 2018-11-18 ENCOUNTER — Other Ambulatory Visit (HOSPITAL_COMMUNITY)
Admission: RE | Admit: 2018-11-18 | Discharge: 2018-11-18 | Disposition: A | Discharge status: Home / Self Care | Payer: Medicaid Other | Source: Ambulatory Visit | Attending: Advanced Practice Midwife | Admitting: Advanced Practice Midwife

## 2018-11-18 ENCOUNTER — Ambulatory Visit (INDEPENDENT_AMBULATORY_CARE_PROVIDER_SITE_OTHER): Payer: Medicaid Other | Admitting: Advanced Practice Midwife

## 2018-11-18 ENCOUNTER — Encounter: Payer: Self-pay | Admitting: Advanced Practice Midwife

## 2018-11-18 VITALS — BP 111/85 | HR 68 | Ht 66.0 in | Wt 155.0 lb

## 2018-11-18 DIAGNOSIS — Z124 Encounter for screening for malignant neoplasm of cervix: Secondary | ICD-10-CM | POA: Insufficient documentation

## 2018-11-18 DIAGNOSIS — N939 Abnormal uterine and vaginal bleeding, unspecified: Secondary | ICD-10-CM

## 2018-11-18 DIAGNOSIS — B9689 Other specified bacterial agents as the cause of diseases classified elsewhere: Secondary | ICD-10-CM

## 2018-11-18 DIAGNOSIS — N760 Acute vaginitis: Secondary | ICD-10-CM

## 2018-11-18 DIAGNOSIS — Z113 Encounter for screening for infections with a predominantly sexual mode of transmission: Secondary | ICD-10-CM

## 2018-11-18 DIAGNOSIS — Z0000 Encounter for general adult medical examination without abnormal findings: Secondary | ICD-10-CM

## 2018-11-18 DIAGNOSIS — Z01419 Encounter for gynecological examination (general) (routine) without abnormal findings: Secondary | ICD-10-CM

## 2018-11-18 MED ORDER — FLUCONAZOLE 150 MG PO TABS: 150.0000 mg | ORAL_TABLET | Freq: Once | ORAL | 1 refills | Status: AC

## 2018-11-18 MED ORDER — METRONIDAZOLE 500 MG PO TABS: 500.0000 mg | ORAL_TABLET | Freq: Two times a day (BID) | ORAL | 3 refills | Status: AC

## 2018-11-18 MED ORDER — METRONIDAZOLE 500 MG PO TABS: 500.0000 mg | ORAL_TABLET | Freq: Two times a day (BID) | ORAL | 3 refills | Status: DC

## 2018-11-18 MED ORDER — FLUCONAZOLE 150 MG PO TABS: 150.0000 mg | ORAL_TABLET | Freq: Once | ORAL | 1 refills | Status: DC

## 2018-11-18 NOTE — Patient Instructions (Signed)
Some natural remedies/prevention to try for bacterial vaginosis: Take a probiotic tablet/capsule every day for at least 1-2 months.   Whenever you have symptoms, use boric acid suppositories vaginally every night for a week.   Do not use scented soaps/perfumes in the vaginal area and wear breathable cotton underwear and do not wear tight restrictive clothing.    Probiotics Probiotics are the good bacteria and yeasts that live in your body and keep your digestive system healthy. Probiotics also help your body's defense system (immune system) and protect your body against the growth of harmful bacteria. Your health care provider may recommend taking a probiotic if you are taking antibiotics or have certain medical conditions, such as:  Diarrhea.  Constipation.  Irritable bowel syndrome.  Lung infections.  Yeast infections.  Acne, eczema, and other skin conditions.  Frequent urinary tract infections. What affects the balance of bacteria in my body? The balance of good bacteria in your body can be affected by:  Antibiotic medicines. These medicines treat infections caused by bacteria. Unfortunately, they may kill the good bacteria in your body as well as the bad bacteria.  Certain medical conditions. Conditions related to an imbalance of bacteria include: ? Stomach and intestine (gastrointestinal) infections. ? Lung infections. ? Skin infections. ? Vaginal infections. ? Inflammatory bowel diseases. ? Stomach ulcers (gastric ulcers). ? Tooth decay and gum disease (periodontal disease).  Stress.  Poor diet. What type of probiotic is right for me? Probiotics contain different types of bacteria (strains). Strains commonly found in probiotics include:  Lactobacillus.  Saccharomyces.  Bifidobacterium. Specific strains have been shown to be more effective for certain health conditions. Ask your health care provider which strain or strains you should use and how often. Probiotics  come in many different forms, strain combinations, and strengths. Some may need to be refrigerated. Always read the label for storage and usage instructions. Certain foods, such as yogurt, contain probiotics. Probiotics can also be bought as a supplement at a pharmacy, health food store, or grocery store. Talk to your health care provider before starting any supplement. What are the side effects of probiotics? Some people have side effects when taking probiotics. Side effects are usually temporary and may include:  Gas.  Bloating.  Cramping. Serious side effects are rare. Follow these instructions at home:   If you are taking probiotics with antibiotics: ? Wait at least 2 hours between taking your medicine and the probiotic. ? Eat foods high in fiber, such as whole grains, beans, and vegetables. These foods can help good bacteria grow. ? Avoid certain foods as told by your health care provider. Summary  Probiotics are the good bacteria and yeasts that live in your body and keep you and your digestive system healthy.  Certain foods, such as yogurt, contain probiotics.  Probiotics can be taken as supplements. They can be bought at a pharmacy, health food store, or grocery store. They come in many different forms, strain combinations, and strengths.  Be sure to talk with your health care provider before taking a probiotic supplement. This information is not intended to replace advice given to you by your health care provider. Make sure you discuss any questions you have with your health care provider. Document Released: 04/22/2014 Document Revised: 06/14/2018 Document Reviewed: 10/10/2017 Elsevier Interactive Patient Education  2019 [employer].

## 2018-11-18 NOTE — Progress Notes (Signed)
Patient presents for Annual Exam  CC:pt states after cycle she has recurrent BV infections.  Pt notes vaginal discharge, no odor Pt aware Family planning may not cover all STD screening.  Pt states her cycles her irregular.  Pt states she feels light headed often, has HA's and notes dizziness.    LMP:10/27/2018 Contraception:BTL  wants to discuss management to help w/ cycles.  STD Screening: Desires Last Pap: 07/25/2016 WNL

## 2018-11-18 NOTE — Telephone Encounter (Signed)
Pt had Pap in office today and has family planning medicaid. Pt desires Depo to treat abnormal bleeding and as backup form of contraception to her BTL.  After pt left the office, further review indicates that [facility] does not cover services, including Pap for pt with BTL.  Discussed with Loyce Dys today and we will bill Medicaid for her visit. If Medicaid does not cover, the pt is NOT to be billed as we scheduled her and provided services without letting her know that her insurance may not cover.    Notified the pt via phone that her insurance may not cover Pap and will not cover Depo. Pt states understanding. Pt to call Medicaid ASAP.  Pt to consider other insurance options for improved coverage.

## 2018-11-18 NOTE — Progress Notes (Signed)
Subjective:     Jennifer Leach is a 38 y.o. female here for routine exam.  Current complaints: Heavier periods since her BTL 2 years ago.  Recurrent BV with thin malodorous discharge and vaginal irritation.  Also ,she is concerned about how effective her BTL is, or what risk she has of pregnancy after BTL.    Personal health questionnaire reviewed: yes.   Gynecologic History No LMP recorded. Contraception: tubal ligation Last Pap: 07/25/2016. Results were: normal   Obstetric History OB History  Gravida Para Term Preterm AB Living  4 4 4    0 4  SAB TAB Ectopic Multiple Live Births    0   0 4    # Outcome Date GA Lbr Len/2nd Weight Sex Delivery Anes PTL Lv  4 Term 02/19/17 [redacted]w[redacted]d 379:00 / 00:30 3720 g F Vag-Spont EPI  LIV  3 Term 09/03/10    M Vag-Spont   LIV  2 Term 09/27/06    F Vag-Spont   LIV  1 Term 07/03/02    M Vag-Spont   LIV     The following portions of the patient's history were reviewed and updated as appropriate: allergies, current medications, past family history, past medical history, past social history, past surgical history and problem list.  Review of Systems Pertinent items noted in HPI and remainder of comprehensive ROS otherwise negative.    Objective:   BP 111/85   Pulse 68   Ht 5\' 6"  (1.676 m)   Wt 70.3 kg   LMP 10/27/2018 (Exact Date)   BMI 25.02 kg/m    VS reviewed, nursing note reviewed,  Constitutional: well developed, well nourished, no distress HEENT: normocephalic CV: normal rate Pulm/chest wall: normal effort Breast Exam:  right breast normal without mass, skin or nipple changes or axillary nodes, left breast normal without mass, skin or nipple changes or axillary nodes Abdomen: soft Neuro: alert and oriented x 3 Skin: warm, dry Psych: affect normal Pelvic exam: Cervix pink, visually closed, without lesion, scant white creamy discharge, vaginal walls and external genitalia normal Bimanual exam: Cervix 0/long/high, firm, anterior, neg  CMT, uterus nontender, nonenlarged, adnexa without tenderness, enlargement, or mass      Assessment/Plan:   1. Well woman exam with routine gynecological exam  2. Screening for STD (sexually transmitted disease) - Cervicovaginal ancillary only( [place])  3. Screening for cervical cancer - Cytology - PAP( [place])  4. Bacterial vaginosis --Clinical exam and symptoms c/w BV.   --Recurrent, discussed prevention with pt including use of probiotics, breathable cotton underwear, decreased use of soaps, etc.   --Rx for Flagyl and for Diflucan since medication sometimes causes yeast infection.  5. Abnormal uterine bleeding (AUB) --Heavier bleeding but regular menses since BTL 2 years ago --Discussed treatment options for heavy bleeding including progesterone only pills, injections, and IUD.  Pt desires Depo but has Family Planning Medicaid so unsure about coverage.   --Will call pt today after find out more about coverage for contraceptive methods post BTL.    Contraception: tubal ligation  Follow up in: 1 year or as needed.   Sharen Counter, CNM 1:28 PM

## 2018-11-19 DIAGNOSIS — N939 Abnormal uterine and vaginal bleeding, unspecified: Secondary | ICD-10-CM | POA: Insufficient documentation

## 2018-11-19 DIAGNOSIS — N938 Other specified abnormal uterine and vaginal bleeding: Secondary | ICD-10-CM | POA: Insufficient documentation

## 2018-11-19 LAB — CERVICOVAGINAL ANCILLARY ONLY
CHLAMYDIA, DNA PROBE: NEGATIVE
Neisseria Gonorrhea: NEGATIVE

## 2018-11-20 LAB — CYTOLOGY - PAP
BACTERIAL VAGINITIS: POSITIVE — AB
CHLAMYDIA, DNA PROBE: NEGATIVE
Candida vaginitis: NEGATIVE
Diagnosis: NEGATIVE
HPV: NOT DETECTED
NEISSERIA GONORRHEA: NEGATIVE
Trichomonas: NEGATIVE

## 2019-03-21 ENCOUNTER — Telehealth: Payer: Self-pay

## 2019-03-21 MED ORDER — METRONIDAZOLE 500 MG PO TABS: 500.0000 mg | ORAL_TABLET | Freq: Two times a day (BID) | ORAL | 0 refills | Status: DC

## 2019-03-21 NOTE — Telephone Encounter (Signed)
Returned call, pt requesting rx for bv. Reports vaginal discharge and odor.

## 2019-05-08 ENCOUNTER — Other Ambulatory Visit: Payer: Self-pay

## 2019-05-08 MED ORDER — FLUCONAZOLE 150 MG PO TABS: 150.0000 mg | ORAL_TABLET | Freq: Once | ORAL | 0 refills | Status: AC

## 2019-07-14 ENCOUNTER — Encounter (HOSPITAL_COMMUNITY): Payer: Self-pay | Admitting: Emergency Medicine

## 2019-07-14 ENCOUNTER — Other Ambulatory Visit: Payer: Self-pay

## 2019-07-14 ENCOUNTER — Ambulatory Visit (HOSPITAL_COMMUNITY)
Admission: EM | Admit: 2019-07-14 | Discharge: 2019-07-14 | Disposition: A | Discharge status: Home / Self Care | Payer: Medicaid Other | Attending: Family Medicine | Admitting: Family Medicine

## 2019-07-14 DIAGNOSIS — F17210 Nicotine dependence, cigarettes, uncomplicated: Secondary | ICD-10-CM | POA: Insufficient documentation

## 2019-07-14 DIAGNOSIS — Z20828 Contact with and (suspected) exposure to other viral communicable diseases: Secondary | ICD-10-CM | POA: Diagnosis not present

## 2019-07-14 DIAGNOSIS — J029 Acute pharyngitis, unspecified: Secondary | ICD-10-CM | POA: Insufficient documentation

## 2019-07-14 LAB — POCT RAPID STREP A: Streptococcus, Group A Screen (Direct): NEGATIVE

## 2019-07-14 NOTE — Discharge Instructions (Addendum)
Take ibuprofen or Tylenol as needed for your discomfort.    Your rapid strep test is negative.  A throat culture is pending; we will call you if it is positive requiring treatment.    Your COVID test is pending.  You should self quarantine until your test result is back and is negative.    Go to the emergency department if you develop high fever, shortness of breath, severe diarrhea, or other concerning symptoms.

## 2019-07-14 NOTE — ED Triage Notes (Signed)
Pt sts sore throat x 2 days  

## 2019-07-14 NOTE — ED Provider Notes (Signed)
[facility]    CSN: [id number] Arrival date & time: 07/14/19  1204      History   Chief Complaint Chief Complaint  Patient presents with  . Sore Throat    HPI Jennifer Leach is a 38 y.o. female.   Patient presents with 2-day history of sore throat.  She denies fever, chills, difficulty swallowing, cough, shortness of breath, vomiting, diarrhea, or other symptoms.  The history is provided by the patient.    Past Medical History:  Diagnosis Date  . Bacterial vaginitis 03/04/2017  . Gonorrhea   . Labial swelling 03/04/2017  . MRSA (methicillin resistant staph aureus) culture positive 08/06/2014    Patient Active Problem List   Diagnosis Date Noted  . Abnormal uterine bleeding (AUB) 11/19/2018  . H/O tubal ligation 03/15/2017  . Bacterial vaginitis 03/04/2017  . Labial swelling 03/04/2017    Past Surgical History:  Procedure Laterality Date  . NO PAST SURGERIES    . TUBAL LIGATION N/A 02/19/2017   Procedure: POST PARTUM TUBAL LIGATION;  Surgeon: [name], MD;  Location: [facility] SU[facility];  Service: Gynecology;  Laterality: N/A;  . WISDOM TOOTH EXTRACTION      OB History    Gravida  4   Para  4   Term  4   Preterm      AB  0   Living  4     SAB      TAB  0   Ectopic      Multiple  0   Live Births  4            Home Medications    Prior to Admission medications   Medication Sig Start Date End Date Taking? Authorizing Provider  fluconazole (DIFLUCAN) 150 MG tablet Take 1 tablet (150 mg total) by mouth daily. Take second dose 72 hours later if symptoms still persists. Patient not taking: Reported on 11/18/2018 05/28/18   Belinda Fisher, PA-C  metroNIDAZOLE (FLAGYL) 500 MG tablet Take 1 tablet (500 mg total) by mouth 2 (two) times daily with a meal. DO NOT CONSUME ALCOHOL WHILE TAKING THIS MEDICATION. Patient not taking: Reported on 11/18/2018 05/28/18   Belinda Fisher, PA-C  metroNIDAZOLE (FLAGYL) 500 MG tablet Take 1 tablet (500 mg  total) by mouth 2 (two) times daily. Patient not taking: Reported on 07/14/2019 03/21/19   Brock Bad, MD    Family History Family History  Problem Relation Age of Onset  . Hypertension Mother     Social History Social History   Tobacco Use  . Smoking status: Current Every Day Smoker    Packs/day: 0.25    Years: 9.00    Pack years: 2.25    Types: Cigarettes    Last attempt to quit: 06/09/2012    Years since quitting: 7.0  . Smokeless tobacco: Never Used  . Tobacco comment: pt reports is is smoking   Substance Use Topics  . Alcohol use: No  . Drug use: No     Allergies   Septra ds [sulfamethoxazole-trimethoprim]   Review of Systems Review of Systems  Constitutional: Negative for chills and fever.  HENT: Positive for sore throat. Negative for congestion, ear pain, rhinorrhea and trouble swallowing.   Eyes: Negative for pain and visual disturbance.  Respiratory: Negative for cough and shortness of breath.   Cardiovascular: Negative for chest pain and palpitations.  Gastrointestinal: Negative for abdominal pain and vomiting.  Genitourinary: Negative for dysuria and hematuria.  Musculoskeletal:  Negative for arthralgias and back pain.  Skin: Negative for color change and rash.  Neurological: Negative for seizures and syncope.  All other systems reviewed and are negative.    Physical Exam Triage Vital Signs ED Triage Vitals  Enc Vitals Group     BP 07/14/19 1245 122/84     Pulse Rate 07/14/19 1245 64     Resp 07/14/19 1245 20     Temp 07/14/19 1245 97.8 F (36.6 C)     Temp Source 07/14/19 1245 Tympanic     SpO2 07/14/19 1245 100 %     Weight --      Height --      Head Circumference --      Peak Flow --      Pain Score 07/14/19 1256 5     Pain Loc --      Pain Edu? --      Excl. in University? --    No data found.  Updated Vital Signs BP 122/84 (BP Location: Right Arm)   Pulse 64   Temp 97.8 F (36.6 C) (Tympanic)   Resp 20   SpO2 100%   Visual  Acuity Right Eye Distance:   Left Eye Distance:   Bilateral Distance:    Right Eye Near:   Left Eye Near:    Bilateral Near:     Physical Exam Vitals signs and nursing note reviewed.  Constitutional:      General: She is not in acute distress.    Appearance: She is well-developed.  HENT:     Head: Normocephalic and atraumatic.     Right Ear: Tympanic membrane normal.     Left Ear: Tympanic membrane normal.     Nose: Nose normal.     Mouth/Throat:     Mouth: Mucous membranes are moist.     Pharynx: Posterior oropharyngeal erythema present. No oropharyngeal exudate.  Eyes:     Conjunctiva/sclera: Conjunctivae normal.  Neck:     Musculoskeletal: Neck supple.  Cardiovascular:     Rate and Rhythm: Normal rate and regular rhythm.     Heart sounds: No murmur.  Pulmonary:     Effort: Pulmonary effort is normal. No respiratory distress.     Breath sounds: Normal breath sounds.  Abdominal:     General: Bowel sounds are normal.     Palpations: Abdomen is soft.     Tenderness: There is no abdominal tenderness. There is no guarding or rebound.  Skin:    General: Skin is warm and dry.     Findings: No rash.  Neurological:     Mental Status: She is alert.      UC Treatments / Results  Labs (all labs ordered are listed, but only abnormal results are displayed) Labs Reviewed  NOVEL CORONAVIRUS, NAA (HOSP ORDER, SEND-OUT TO REF LAB; TAT 18-24 HRS)  CULTURE, GROUP A STREP [facility]  POCT RAPID STREP A    EKG   Radiology No results found.  Procedures Procedures (including critical care time)  Medications Ordered in UC Medications - No data to display  Initial Impression / Assessment and Plan / UC Course  I have reviewed the triage vital signs and the nursing notes.  Pertinent labs & imaging results that were available during my care of the patient were reviewed by me and considered in my medical decision making (see chart for details).    Sore throat.  Rapid strep  negative.  Throat culture pending.  Instructed patient to take Tylenol or  ibuprofen as needed for her discomfort.  COVID test performed here.  Instructed patient to self quarantine until her test results are back.  Instructed patient to go to the emergency department if she develops high fever, shortness of breath, severe diarrhea, or other concerning symptoms.  Patient agrees with plan of care.    Final Clinical Impressions(s) / UC Diagnoses   Final diagnoses:  Sore throat     Discharge Instructions     Take ibuprofen or Tylenol as needed for your discomfort.    Your rapid strep test is negative.  A throat culture is pending; we will call you if it is positive requiring treatment.    Your COVID test is pending.  You should self quarantine until your test result is back and is negative.    Go to the emergency department if you develop high fever, shortness of breath, severe diarrhea, or other concerning symptoms.       ED Prescriptions    None     PDMP not reviewed this encounter.   Mickie Bailate, Zaylin Pistilli H, NP 07/14/19 1322

## 2019-07-15 ENCOUNTER — Emergency Department (HOSPITAL_COMMUNITY)
Admission: EM | Admit: 2019-07-15 | Discharge: 2019-07-15 | Disposition: A | Discharge status: Home / Self Care | Payer: Medicaid Other | Attending: Emergency Medicine | Admitting: Emergency Medicine

## 2019-07-15 ENCOUNTER — Encounter (HOSPITAL_COMMUNITY): Payer: Self-pay | Admitting: Emergency Medicine

## 2019-07-15 DIAGNOSIS — F17210 Nicotine dependence, cigarettes, uncomplicated: Secondary | ICD-10-CM | POA: Insufficient documentation

## 2019-07-15 DIAGNOSIS — J029 Acute pharyngitis, unspecified: Secondary | ICD-10-CM | POA: Diagnosis not present

## 2019-07-15 LAB — GROUP A STREP BY PCR: Group A Strep by PCR: NOT DETECTED

## 2019-07-15 MED ORDER — OXYCODONE-ACETAMINOPHEN 5-325 MG PO TABS: 1.0000 | ORAL_TABLET | Freq: Four times a day (QID) | ORAL | 0 refills | Status: DC | PRN

## 2019-07-15 MED ORDER — DEXAMETHASONE 4 MG PO TABS
10.0000 mg | ORAL_TABLET | Freq: Once | ORAL | Status: AC
  Administered 2019-07-15: 10 mg via ORAL
  Filled 2019-07-15: qty 3

## 2019-07-15 MED ORDER — ACETAMINOPHEN 160 MG/5ML PO SOLN
650.0000 mg | Freq: Once | ORAL | Status: AC
  Administered 2019-07-15: 650 mg via ORAL
  Filled 2019-07-15: qty 20.3

## 2019-07-15 MED ORDER — OXYCODONE-ACETAMINOPHEN 5-325 MG PO TABS
1.0000 | ORAL_TABLET | Freq: Once | ORAL | Status: AC
  Administered 2019-07-15: 1 via ORAL
  Filled 2019-07-15: qty 1

## 2019-07-15 NOTE — ED Triage Notes (Signed)
Patient with sore throat for 3 days, seen at [facility], rapid strep was negative, culture for strep and Covid testing done, no results at this time.  Patient continues with sore throat, having hard time, swallowing and eating.

## 2019-07-15 NOTE — ED Provider Notes (Signed)
[place] EMERGENCY DEPARTMENT Provider Note   CSN: [id number] Arrival date & time: 07/15/19  0609     History   Chief Complaint Chief Complaint  Patient presents with  . Sore Throat    HPI Jennifer Leach is a 38 y.o. female.     HPI   38 year old female presents today with complaints of sore throat.  She notes a 3-day history of sore throat.  She denies any fever, notes pain with swallowing, no shortness of breath cough or any other concerning signs or symptoms.  She was seen yesterday and had a rapid strep test that was negative, culture pending.  She also had COVID testing.  She does work in a nursing home.  She took Tylenol which did not improve her symptoms.  She has children at home that are not sick.    Past Medical History:  Diagnosis Date  . Bacterial vaginitis 03/04/2017  . Gonorrhea   . Labial swelling 03/04/2017  . MRSA (methicillin resistant staph aureus) culture positive 08/06/2014    Patient Active Problem List   Diagnosis Date Noted  . Abnormal uterine bleeding (AUB) 11/19/2018  . H/O tubal ligation 03/15/2017  . Bacterial vaginitis 03/04/2017  . Labial swelling 03/04/2017    Past Surgical History:  Procedure Laterality Date  . NO PAST SURGERIES    . TUBAL LIGATION N/A 02/19/2017   Procedure: POST PARTUM TUBAL LIGATION;  Surgeon: Chancy Milroy, MD;  Location: [facility];  Service: Gynecology;  Laterality: N/A;  . WISDOM TOOTH EXTRACTION       OB History    Gravida  4   Para  4   Term  4   Preterm      AB  0   Living  4     SAB      TAB  0   Ectopic      Multiple  0   Live Births  4            Home Medications    Prior to Admission medications   Medication Sig Start Date End Date Taking? Authorizing Provider  fluconazole (DIFLUCAN) 150 MG tablet Take 1 tablet (150 mg total) by mouth daily. Take second dose 72 hours later if symptoms still persists. Patient not taking: Reported on  11/18/2018 05/28/18   Ok Edwards, PA-C  metroNIDAZOLE (FLAGYL) 500 MG tablet Take 1 tablet (500 mg total) by mouth 2 (two) times daily with a meal. DO NOT CONSUME ALCOHOL WHILE TAKING THIS MEDICATION. Patient not taking: Reported on 11/18/2018 05/28/18   Ok Edwards, PA-C  metroNIDAZOLE (FLAGYL) 500 MG tablet Take 1 tablet (500 mg total) by mouth 2 (two) times daily. Patient not taking: Reported on 07/14/2019 03/21/19   Shelly Bombard, MD  oxyCODONE-acetaminophen (PERCOCET/ROXICET) 5-325 MG tablet Take 1 tablet by mouth every 6 (six) hours as needed. 07/15/19   Okey Regal, PA-C    Family History Family History  Problem Relation Age of Onset  . Hypertension Mother     Social History Social History   Tobacco Use  . Smoking status: Current Every Day Smoker    Packs/day: 0.25    Years: 9.00    Pack years: 2.25    Types: Cigarettes    Last attempt to quit: 06/09/2012    Years since quitting: 7.1  . Smokeless tobacco: Never Used  . Tobacco comment: pt reports is is smoking   Substance Use Topics  . Alcohol use: No  .  Drug use: No     Allergies   Septra ds [sulfamethoxazole-trimethoprim]   Review of Systems Review of Systems  All other systems reviewed and are negative.    Physical Exam Updated Vital Signs BP 117/83 (BP Location: Right Arm)   Pulse 76   Temp 98.2 F (36.8 C) (Oral)   Resp 18   Ht 5\' 6"  (1.676 m)   Wt 72.1 kg   SpO2 98%   BMI 25.66 kg/m   Physical Exam Vitals signs and nursing note reviewed.  Constitutional:      Appearance: She is well-developed.  HENT:     Head: Normocephalic and atraumatic.     Comments: Bilateral tonsillar erythema and swelling, symmetric, exudate bilateral, no signs of PTA RPA, no pooling of secretions, voice is normal uvula is midline rise of phonation neck is supple, minimal anterior cervical lymphadenopathy Eyes:     General: No scleral icterus.       Right eye: No discharge.        Left eye: No discharge.      Conjunctiva/sclera: Conjunctivae normal.     Pupils: Pupils are equal, round, and reactive to light.  Neck:     Musculoskeletal: Normal range of motion.     Vascular: No JVD.     Trachea: No tracheal deviation.  Pulmonary:     Effort: Pulmonary effort is normal.     Breath sounds: No stridor.  Neurological:     Mental Status: She is alert and oriented to person, place, and time.     Coordination: Coordination normal.  Psychiatric:        Behavior: Behavior normal.        Thought Content: Thought content normal.        Judgment: Judgment normal.      ED Treatments / Results  Labs (all labs ordered are listed, but only abnormal results are displayed) Labs Reviewed  GROUP A STREP BY PCR    EKG None  Radiology No results found.  Procedures Procedures (including critical care time)  Medications Ordered in ED Medications  acetaminophen (TYLENOL) solution 650 mg (650 mg Oral Given 07/15/19 0645)  dexamethasone (DECADRON) tablet 10 mg (10 mg Oral Given 07/15/19 1107)  oxyCODONE-acetaminophen (PERCOCET/ROXICET) 5-325 MG per tablet 1 tablet (1 tablet Oral Given 07/15/19 1108)     Initial Impression / Assessment and Plan / ED Course  I have reviewed the triage vital signs and the nursing notes.  Pertinent labs & imaging results that were available during my care of the patient were reviewed by me and considered in my medical decision making (see chart for details).          Assessment/Plan: 38 year old female presents today with pharyngitis.  I did repeat her strep.  Was negative.  She was given Decadron and pain medicine.  She be discharged home with pain medicine close follow-up.  No complicating features presently.  She verbalized understanding and agreement to today's plan had no further questions or concerns.   Final Clinical Impressions(s) / ED Diagnoses   Final diagnoses:  Acute pharyngitis, unspecified etiology    ED Discharge Orders         Ordered     oxyCODONE-acetaminophen (PERCOCET/ROXICET) 5-325 MG tablet  Every 6 hours PRN     07/15/19 1112           09/14/19, PA-C 07/15/19 1112    09/14/19, MD 07/16/19 [phone]

## 2019-07-15 NOTE — Discharge Instructions (Signed)
Please read attached information. If you experience any new or worsening signs or symptoms please return to the emergency room for evaluation. Please follow-up with your primary care provider or specialist as discussed. Please use medication prescribed only as directed and discontinue taking if you have any concerning signs or symptoms.   °

## 2019-07-16 LAB — CULTURE, GROUP A STREP (THRC)

## 2019-07-16 LAB — NOVEL CORONAVIRUS, NAA (HOSP ORDER, SEND-OUT TO REF LAB; TAT 18-24 HRS): SARS-CoV-2, NAA: NOT DETECTED

## 2019-07-19 ENCOUNTER — Other Ambulatory Visit: Payer: Self-pay

## 2019-07-19 ENCOUNTER — Encounter (HOSPITAL_COMMUNITY): Payer: Self-pay

## 2019-07-19 ENCOUNTER — Ambulatory Visit (HOSPITAL_COMMUNITY)
Admission: EM | Admit: 2019-07-19 | Discharge: 2019-07-19 | Disposition: A | Discharge status: Home / Self Care | Payer: Medicaid Other | Attending: Family Medicine | Admitting: Family Medicine

## 2019-07-19 DIAGNOSIS — N898 Other specified noninflammatory disorders of vagina: Secondary | ICD-10-CM | POA: Insufficient documentation

## 2019-07-19 MED ORDER — AZITHROMYCIN 250 MG PO TABS
1000.0000 mg | ORAL_TABLET | Freq: Once | ORAL | Status: AC
  Administered 2019-07-19: 13:00:00 1000 mg via ORAL

## 2019-07-19 MED ORDER — METRONIDAZOLE 0.75 % VA GEL: 1.0000 | Freq: Every day | VAGINAL | 0 refills | Status: AC

## 2019-07-19 MED ORDER — CEFTRIAXONE SODIUM 250 MG IJ SOLR
250.0000 mg | Freq: Once | INTRAMUSCULAR | Status: AC
  Administered 2019-07-19: 250 mg via INTRAMUSCULAR

## 2019-07-19 MED ORDER — AZITHROMYCIN 250 MG PO TABS
ORAL_TABLET | ORAL | Status: AC
  Filled 2019-07-19: qty 4

## 2019-07-19 MED ORDER — FLUCONAZOLE 150 MG PO TABS: ORAL_TABLET | ORAL | 0 refills | Status: DC

## 2019-07-19 MED ORDER — CEFTRIAXONE SODIUM 250 MG IJ SOLR
INTRAMUSCULAR | Status: AC
  Filled 2019-07-19: qty 250

## 2019-07-19 NOTE — ED Provider Notes (Signed)
[facility]   [id number] 07/19/19 Arrival Time: 1149  ASSESSMENT & PLAN:  1. Vaginal discharge     No s/s of PID.   Discharge Instructions      You have been given the following medications today for treatment of suspected gonorrhea and/or chlamydia:  cefTRIAXone (ROCEPHIN) injection 250 mg azithromycin (ZITHROMAX) tablet 1,000 mg  Even though we have treated you today, we have sent testing for sexually transmitted infections. We will notify you of any positive results once they are received. If required, we will prescribe any medications you might need.  Please refrain from all sexual activity for at least the next seven days.  Additional medications: Meds ordered this encounter  Medications  . metroNIDAZOLE (METROGEL VAGINAL) 0.75 % vaginal gel    Sig: Place 1 Applicatorful vaginally daily for 5 days.    Dispense:  50 g    Refill:  0  . fluconazole (DIFLUCAN) 150 MG tablet    Sig: Take one tablet by mouth as a single dose. May repeat in 3 days if symptoms persist.    Dispense:  2 tablet    Refill:  0        Pending: Labs Reviewed  CERVICOVAGINAL ANCILLARY ONLY    Will notify of any positive results. Instructed to refrain from sexual activity for at least seven days.  Reviewed expectations re: course of current medical issues. Questions answered. Outlined signs and symptoms indicating need for more acute intervention. Patient verbalized understanding. After Visit Summary given.   SUBJECTIVE:  Jennifer Leach is a 38 y.o. female who presents with complaint of vaginal discharge. Onset gradual. First noticed 2 d ago. Describes discharge as thick and white/yellow. Denies: urinary frequency, hematuria, urinary hesitancy, chills and sweats. Afebrile. No abdominal or pelvic pain. Normal PO intake wihout n/v. No rashes or lesions. Reports that she is sexually active with single female partner. OTC treatment: none. History of STI: H/O treated chlamydia  earlier this year. H/O BV and vaginal yeast infections. "Want to be treated for it all".  Patient's last menstrual period was 06/28/2019.  ROS: As per HPI. All other systems negative.   OBJECTIVE:  Vitals:   07/19/19 1207  BP: (!) 129/93  Pulse: 79  Resp: 16  Temp: 98.5 F (36.9 C)  TempSrc: Oral  SpO2: 98%     General appearance: alert, cooperative, appears stated age and no distress Throat: lips, mucosa, and tongue normal; teeth and gums normal CV: RRR Lungs: CTAB Back: no CVA tenderness; FROM at waist Abdomen: soft, non-tender GU: deferred Skin: warm and dry Psychological: alert and cooperative; normal mood and affect.   Labs Reviewed  CERVICOVAGINAL ANCILLARY ONLY    Allergies  Allergen Reactions  . Septra Ds [Sulfamethoxazole-Trimethoprim] Anaphylaxis, Swelling, Rash and Other (See Comments)    Reaction:  Facial swelling     Past Medical History:  Diagnosis Date  . Bacterial vaginitis 03/04/2017  . Gonorrhea   . Labial swelling 03/04/2017  . MRSA (methicillin resistant staph aureus) culture positive 08/06/2014   Family History  Problem Relation Age of Onset  . Hypertension Mother    Social History   Socioeconomic History  . Marital status: Single    Spouse name: Not on file  . Number of children: Not on file  . Years of education: Not on file  . Highest education level: Not on file  Occupational History  . Not on file  Social Needs  . Financial resource strain: Not on file  . Food  insecurity    Worry: Not on file    Inability: Not on file  . Transportation needs    Medical: Not on file    Non-medical: Not on file  Tobacco Use  . Smoking status: Current Every Day Smoker    Packs/day: 0.25    Years: 9.00    Pack years: 2.25    Types: Cigarettes    Last attempt to quit: 06/09/2012    Years since quitting: 7.1  . Smokeless tobacco: Never Used  . Tobacco comment: pt reports is is smoking   Substance and Sexual Activity  . Alcohol use: No  .  Drug use: No  . Sexual activity: Yes    Partners: Male    Birth control/protection: Surgical  Lifestyle  . Physical activity    Days per week: Not on file    Minutes per session: Not on file  . Stress: Not on file  Relationships  . Social Herbalist on phone: Not on file    Gets together: Not on file    Attends religious service: Not on file    Active member of club or organization: Not on file    Attends meetings of clubs or organizations: Not on file    Relationship status: Not on file  . Intimate partner violence    Fear of current or ex partner: Not on file    Emotionally abused: Not on file    Physically abused: Not on file    Forced sexual activity: Not on file  Other Topics Concern  . Not on file  Social History Narrative  . Not on file          Vanessa Kick, MD 07/19/19 1300

## 2019-07-19 NOTE — Discharge Instructions (Addendum)
You have been given the following medications today for treatment of suspected gonorrhea and/or chlamydia:  cefTRIAXone (ROCEPHIN) injection 250 mg azithromycin (ZITHROMAX) tablet 1,000 mg  Even though we have treated you today, we have sent testing for sexually transmitted infections. We will notify you of any positive results once they are received. If required, we will prescribe any medications you might need.  Please refrain from all sexual activity for at least the next seven days.  Additional medications: Meds ordered this encounter  Medications   metroNIDAZOLE (METROGEL VAGINAL) 0.75 % vaginal gel    Sig: Place 1 Applicatorful vaginally daily for 5 days.    Dispense:  50 g    Refill:  0   fluconazole (DIFLUCAN) 150 MG tablet    Sig: Take one tablet by mouth as a single dose. May repeat in 3 days if symptoms persist.    Dispense:  2 tablet    Refill:  0

## 2019-07-19 NOTE — ED Triage Notes (Signed)
Pt present vaginal discharge, symptoms started yesterday.

## 2019-07-21 ENCOUNTER — Telehealth (HOSPITAL_COMMUNITY): Payer: Self-pay

## 2019-07-21 NOTE — Telephone Encounter (Signed)
Returned pt.'s call per her request for her Covid 19 results.  Informed her that her results are negative and they are visible on My Chart.  She reports that she is unable to see them on My Chart.  I informed her that she has a number that she can call My Chart to assist with any questions.  She verbalized understanding and all questions answered.

## 2019-07-22 LAB — CERVICOVAGINAL ANCILLARY ONLY
Bacterial vaginitis: NEGATIVE
Candida vaginitis: NEGATIVE
Chlamydia: NEGATIVE
Neisseria Gonorrhea: NEGATIVE
Trichomonas: NEGATIVE

## 2019-09-24 ENCOUNTER — Ambulatory Visit (HOSPITAL_COMMUNITY)
Admission: EM | Admit: 2019-09-24 | Discharge: 2019-09-24 | Disposition: A | Discharge status: Home / Self Care | Payer: Medicaid Other | Attending: Family Medicine | Admitting: Family Medicine

## 2019-09-24 ENCOUNTER — Encounter (HOSPITAL_COMMUNITY): Payer: Self-pay

## 2019-09-24 ENCOUNTER — Other Ambulatory Visit: Payer: Self-pay

## 2019-09-24 DIAGNOSIS — Z20828 Contact with and (suspected) exposure to other viral communicable diseases: Secondary | ICD-10-CM | POA: Insufficient documentation

## 2019-09-24 DIAGNOSIS — Z3202 Encounter for pregnancy test, result negative: Secondary | ICD-10-CM

## 2019-09-24 DIAGNOSIS — Z202 Contact with and (suspected) exposure to infections with a predominantly sexual mode of transmission: Secondary | ICD-10-CM

## 2019-09-24 DIAGNOSIS — Z881 Allergy status to other antibiotic agents status: Secondary | ICD-10-CM | POA: Diagnosis not present

## 2019-09-24 DIAGNOSIS — F17210 Nicotine dependence, cigarettes, uncomplicated: Secondary | ICD-10-CM | POA: Diagnosis not present

## 2019-09-24 DIAGNOSIS — R300 Dysuria: Secondary | ICD-10-CM | POA: Diagnosis not present

## 2019-09-24 DIAGNOSIS — Z87440 Personal history of urinary (tract) infections: Secondary | ICD-10-CM | POA: Diagnosis not present

## 2019-09-24 DIAGNOSIS — N898 Other specified noninflammatory disorders of vagina: Secondary | ICD-10-CM

## 2019-09-24 DIAGNOSIS — Z8249 Family history of ischemic heart disease and other diseases of the circulatory system: Secondary | ICD-10-CM | POA: Insufficient documentation

## 2019-09-24 DIAGNOSIS — Z9851 Tubal ligation status: Secondary | ICD-10-CM | POA: Diagnosis not present

## 2019-09-24 LAB — POCT URINALYSIS DIP (DEVICE)
Bilirubin Urine: NEGATIVE
Glucose, UA: NEGATIVE mg/dL
Hgb urine dipstick: NEGATIVE
Ketones, ur: NEGATIVE mg/dL
Nitrite: NEGATIVE
Protein, ur: NEGATIVE mg/dL
Specific Gravity, Urine: 1.025 (ref 1.005–1.030)
Urobilinogen, UA: 2 mg/dL — ABNORMAL HIGH (ref 0.0–1.0)
pH: 7 (ref 5.0–8.0)

## 2019-09-24 LAB — POC URINE PREG, ED: Preg Test, Ur: NEGATIVE

## 2019-09-24 LAB — POCT PREGNANCY, URINE: Preg Test, Ur: NEGATIVE

## 2019-09-24 MED ORDER — FLUCONAZOLE 150 MG PO TABS: 150.0000 mg | ORAL_TABLET | Freq: Once | ORAL | 0 refills | Status: AC

## 2019-09-24 MED ORDER — NITROFURANTOIN MONOHYD MACRO 100 MG PO CAPS: 100.0000 mg | ORAL_CAPSULE | Freq: Two times a day (BID) | ORAL | 0 refills | Status: AC

## 2019-09-24 MED ORDER — AZITHROMYCIN 250 MG PO TABS
ORAL_TABLET | ORAL | Status: AC
  Filled 2019-09-24: qty 4

## 2019-09-24 MED ORDER — AZITHROMYCIN 250 MG PO TABS
1000.0000 mg | ORAL_TABLET | Freq: Once | ORAL | Status: AC
  Administered 2019-09-24: 12:00:00 1000 mg via ORAL

## 2019-09-24 MED ORDER — CEFTRIAXONE SODIUM 250 MG IJ SOLR
250.0000 mg | Freq: Once | INTRAMUSCULAR | Status: AC
  Administered 2019-09-24: 12:00:00 250 mg via INTRAMUSCULAR

## 2019-09-24 MED ORDER — METRONIDAZOLE 500 MG PO TABS: 500.0000 mg | ORAL_TABLET | Freq: Two times a day (BID) | ORAL | 0 refills | Status: AC

## 2019-09-24 MED ORDER — CEFTRIAXONE SODIUM 250 MG IJ SOLR
INTRAMUSCULAR | Status: AC
  Filled 2019-09-24: qty 250

## 2019-09-24 NOTE — Discharge Instructions (Signed)
We have treated you today for gonorrhea and chlamydia, with Rocephin and azithromycin. Please refrain from sexual intercourse for 7 days while medicines eliminating infection.   Later today begin taking Macrobid twice daily x5 days for possible UTI, metronidazole/Flagyl twice daily x1 week for bacterial vaginosis  We are testing you for Gonorrhea, Chlamydia, Trichomonas, Yeast and Bacterial Vaginosis.  We also sending her urine for culture.  We will call you if anything is positive and let you know if you require any further treatment. Please inform partners of any positive results.   Please return if symptoms not improving with treatment, development of fever, nausea, vomiting, abdominal pain.

## 2019-09-24 NOTE — ED Notes (Signed)
Nasal swab in lab, instructed patient on vaginal swab

## 2019-09-24 NOTE — ED Triage Notes (Signed)
Pt presents to the UC with lower back pain, burning sensation with urination and white vaginal discharge x 3 days.

## 2019-09-24 NOTE — ED Notes (Signed)
Cervicovaginal swab placed in lab

## 2019-09-24 NOTE — ED Provider Notes (Signed)
[facility]    CSN: [id number] Arrival date & time: 09/24/19  1038      History   Chief Complaint Chief Complaint  Patient presents with  . Dysuria  . Back Pain  . Vaginal Discharge    HPI Jennifer Leach is a 38 y.o. female history of recurrent bacterial vaginosis presenting today for evaluation of possibly UTI and vaginal discharge.  Patient states that over the past 3 days she has developed dysuria.  She has also noticed some vaginal discharge.  Feels burning with urination as well as when not urinating.  Has had some lower back pain and feels similar to past urinary tract infections.  Typically does not get urinary symptoms with bacterial vaginosis.  She denies specific concerns for STDs, but is requesting empiric treatment for gonorrhea today.  She denies any rashes or lesions.  Denies pelvic pain or abdominal pain.  Denies nausea or vomiting.  Denies fevers.  Previous tubal ligation, last menstrual cycle just ended recently.  HPI  Past Medical History:  Diagnosis Date  . Bacterial vaginitis 03/04/2017  . Gonorrhea   . Labial swelling 03/04/2017  . MRSA (methicillin resistant staph aureus) culture positive 08/06/2014    Patient Active Problem List   Diagnosis Date Noted  . Abnormal uterine bleeding (AUB) 11/19/2018  . H/O tubal ligation 03/15/2017  . Bacterial vaginitis 03/04/2017  . Labial swelling 03/04/2017    Past Surgical History:  Procedure Laterality Date  . NO PAST SURGERIES    . TUBAL LIGATION N/A 02/19/2017   Procedure: POST PARTUM TUBAL LIGATION;  Surgeon: [name], MD;  Location: [facility];  Service: Gynecology;  Laterality: N/A;  . WISDOM TOOTH EXTRACTION      OB History    Gravida  4   Para  4   Term  4   Preterm      AB  0   Living  4     SAB      TAB  0   Ectopic      Multiple  0   Live Births  4            Home Medications    Prior to Admission medications   Medication Sig Start Date End  Date Taking? Authorizing Provider  fluconazole (DIFLUCAN) 150 MG tablet Take 1 tablet (150 mg total) by mouth once for 1 dose. 09/24/19 09/24/19  Reda Gettis C, PA-C  metroNIDAZOLE (FLAGYL) 500 MG tablet Take 1 tablet (500 mg total) by mouth 2 (two) times daily for 7 days. 09/24/19 10/01/19  Brylon Brenning C, PA-C  nitrofurantoin, macrocrystal-monohydrate, (MACROBID) 100 MG capsule Take 1 capsule (100 mg total) by mouth 2 (two) times daily for 5 days. 09/24/19 09/29/19  Toniesha Zellner, Junius Creamer, PA-C    Family History Family History  Problem Relation Age of Onset  . Hypertension Mother     Social History Social History   Tobacco Use  . Smoking status: Current Every Day Smoker    Packs/day: 0.25    Years: 9.00    Pack years: 2.25    Types: Cigarettes    Last attempt to quit: 06/09/2012    Years since quitting: 7.2  . Smokeless tobacco: Never Used  . Tobacco comment: pt reports is is smoking   Substance Use Topics  . Alcohol use: No  . Drug use: No     Allergies   Septra ds [sulfamethoxazole-trimethoprim]   Review of Systems Review of Systems  Constitutional: Negative  for fever.  Respiratory: Negative for shortness of breath.   Cardiovascular: Negative for chest pain.  Gastrointestinal: Negative for abdominal pain, diarrhea, nausea and vomiting.  Genitourinary: Positive for dysuria and vaginal discharge. Negative for flank pain, genital sores, hematuria, menstrual problem, vaginal bleeding and vaginal pain.  Musculoskeletal: Positive for back pain.  Skin: Negative for rash.  Neurological: Negative for dizziness, light-headedness and headaches.     Physical Exam Triage Vital Signs ED Triage Vitals  Enc Vitals Group     BP 09/24/19 1045 125/84     Pulse Rate 09/24/19 1045 66     Resp 09/24/19 1045 16     Temp 09/24/19 1045 (!) 97.3 F (36.3 C)     Temp Source 09/24/19 1045 Oral     SpO2 09/24/19 1045 100 %     Weight --      Height --      Head Circumference --        Peak Flow --      Pain Score 09/24/19 1044 8     Pain Loc --      Pain Edu? --      Excl. in [place]? --    No data found.  Updated Vital Signs BP 125/84 (BP Location: Left Arm)   Pulse 66   Temp (!) 97.3 F (36.3 C) (Oral)   Resp 16   LMP  (Within Weeks) Comment: 3 weeks  SpO2 100%   Visual Acuity Right Eye Distance:   Left Eye Distance:   Bilateral Distance:    Right Eye Near:   Left Eye Near:    Bilateral Near:     Physical Exam Vitals and nursing note reviewed.  Constitutional:      Appearance: She is well-developed.     Comments: No acute distress  HENT:     Head: Normocephalic and atraumatic.     Nose: Nose normal.  Eyes:     Conjunctiva/sclera: Conjunctivae normal.  Cardiovascular:     Rate and Rhythm: Normal rate.  Pulmonary:     Effort: Pulmonary effort is normal. No respiratory distress.  Abdominal:     General: There is no distension.     Comments: Soft, nondistended, nontender to light and deep palpation throughout entire abdomen  Musculoskeletal:        General: Normal range of motion.     Cervical back: Neck supple.  Skin:    General: Skin is warm and dry.  Neurological:     Mental Status: She is alert and oriented to person, place, and time.      UC Treatments / Results  Labs (all labs ordered are listed, but only abnormal results are displayed) Labs Reviewed  POCT URINALYSIS DIP (DEVICE) - Abnormal; Notable for the following components:      Result Value   Urobilinogen, UA 2.0 (*)    Leukocytes,Ua SMALL (*)    All other components within normal limits  NOVEL CORONAVIRUS, NAA (HOSP ORDER, SEND-OUT TO REF LAB; TAT 18-24 HRS)  URINE CULTURE  POC URINE PREG, ED  POCT PREGNANCY, URINE  CERVICOVAGINAL ANCILLARY ONLY    EKG   Radiology No results found.  Procedures Procedures (including critical care time)  Medications Ordered in UC Medications  cefTRIAXone (ROCEPHIN) injection 250 mg (has no administration in time range)   azithromycin (ZITHROMAX) tablet 1,000 mg (has no administration in time range)    Initial Impression / Assessment and Plan / UC Course  I have reviewed the triage vital signs  and the nursing notes.  Pertinent labs & imaging results that were available during my care of the patient were reviewed by me and considered in my medical decision making (see chart for details).    Small leuks on UA, unclear if this is from true UTI or from discharge.  Will send for culture.  Vaginal swab obtained to evaluate for causes of discharge.  Per patient request empirically treating for gonorrhea and chlamydia today with Rocephin and azithromycin.  Will send home with Macrobid to treat for possible UTI as well as metronidazole for BV.  Diflucan if developing any symptoms after antibiotics.  Will call with results if abnormal and alter treatment as needed.  Continue to monitor,Discussed strict return precautions. Patient verbalized understanding and is agreeable with plan.  Final Clinical Impressions(s) / UC Diagnoses   Final diagnoses:  Dysuria  Vaginal discharge     Discharge Instructions     We have treated you today for gonorrhea and chlamydia, with Rocephin and azithromycin. Please refrain from sexual intercourse for 7 days while medicines eliminating infection.   Later today begin taking Macrobid twice daily x5 days for possible UTI, metronidazole/Flagyl twice daily x1 week for bacterial vaginosis  We are testing you for Gonorrhea, Chlamydia, Trichomonas, Yeast and Bacterial Vaginosis.  We also sending her urine for culture.  We will call you if anything is positive and let you know if you require any further treatment. Please inform partners of any positive results.   Please return if symptoms not improving with treatment, development of fever, nausea, vomiting, abdominal pain.     ED Prescriptions    Medication Sig Dispense Auth. Provider   metroNIDAZOLE (FLAGYL) 500 MG tablet Take 1 tablet  (500 mg total) by mouth 2 (two) times daily for 7 days. 14 tablet Ishmail Mcmanamon C, PA-C   nitrofurantoin, macrocrystal-monohydrate, (MACROBID) 100 MG capsule Take 1 capsule (100 mg total) by mouth 2 (two) times daily for 5 days. 10 capsule Sou Nohr C, PA-C   fluconazole (DIFLUCAN) 150 MG tablet Take 1 tablet (150 mg total) by mouth once for 1 dose. 2 tablet Evens Meno, WellingtonHallie C, PA-C     PDMP not reviewed this encounter.   Lew DawesWieters, Ellamarie Naeve C, [place] JerseyPA-C 09/24/19 1132

## 2019-09-25 LAB — URINE CULTURE: Culture: <10000 — AB

## 2019-09-26 ENCOUNTER — Telehealth (HOSPITAL_COMMUNITY): Payer: Self-pay | Admitting: Emergency Medicine

## 2019-09-26 LAB — NOVEL CORONAVIRUS, NAA (HOSP ORDER, SEND-OUT TO REF LAB; TAT 18-24 HRS): SARS-CoV-2, NAA: NOT DETECTED

## 2019-09-26 LAB — CERVICOVAGINAL ANCILLARY ONLY
Bacterial vaginitis: POSITIVE — AB
Candida vaginitis: POSITIVE — AB
Chlamydia: POSITIVE — AB
Neisseria Gonorrhea: NEGATIVE
Trichomonas: NEGATIVE

## 2019-09-26 NOTE — Telephone Encounter (Signed)
Chlamydia is positive.  This was treated at the urgent care visit with po zithromax 1g.  Pt needs education to please refrain from sexual intercourse for 7 days to give the medicine time to work.  Sexual partners need to be notified and tested/treated.  Condoms may reduce risk of reinfection.  Recheck or followup with PCP for further evaluation if symptoms are not improving.  [facility] notified.  Candida (yeast) is positive.  Prescription for fluconazole was given at the urgent care visit.    Bacterial Vaginosis test is positive.  Prescription for metronidazole was given at the urgent care visit.  Patient contacted by phone and made aware of    results. Pt verbalized understanding and had all questions answered.

## 2019-09-28 ENCOUNTER — Telehealth: Payer: Self-pay

## 2019-09-28 NOTE — Telephone Encounter (Signed)

## 2019-11-19 ENCOUNTER — Other Ambulatory Visit: Payer: Self-pay

## 2019-11-19 ENCOUNTER — Other Ambulatory Visit (HOSPITAL_COMMUNITY)
Admission: RE | Admit: 2019-11-19 | Discharge: 2019-11-19 | Disposition: A | Discharge status: Home / Self Care | Payer: Medicaid Other | Source: Ambulatory Visit | Attending: Advanced Practice Midwife | Admitting: Advanced Practice Midwife

## 2019-11-19 ENCOUNTER — Encounter: Payer: Self-pay | Admitting: Advanced Practice Midwife

## 2019-11-19 ENCOUNTER — Ambulatory Visit: Payer: Medicaid Other | Admitting: Advanced Practice Midwife

## 2019-11-19 VITALS — BP 123/81 | HR 85 | Ht 66.0 in | Wt 156.9 lb

## 2019-11-19 DIAGNOSIS — N898 Other specified noninflammatory disorders of vagina: Secondary | ICD-10-CM | POA: Insufficient documentation

## 2019-11-19 DIAGNOSIS — Z9851 Tubal ligation status: Secondary | ICD-10-CM | POA: Diagnosis not present

## 2019-11-19 DIAGNOSIS — Z3042 Encounter for surveillance of injectable contraceptive: Secondary | ICD-10-CM

## 2019-11-19 DIAGNOSIS — Z8619 Personal history of other infectious and parasitic diseases: Secondary | ICD-10-CM | POA: Diagnosis not present

## 2019-11-19 DIAGNOSIS — N926 Irregular menstruation, unspecified: Secondary | ICD-10-CM | POA: Diagnosis not present

## 2019-11-19 MED ORDER — MEDROXYPROGESTERONE ACETATE 150 MG/ML IM SUSP
150.0000 mg | Freq: Once | INTRAMUSCULAR | Status: AC
  Administered 2019-11-19: 11:00:00 150 mg via INTRAMUSCULAR

## 2019-11-19 MED ORDER — MEDROXYPROGESTERONE ACETATE 150 MG/ML IM SUSP: 150.0000 mg | INTRAMUSCULAR | 0 refills | Status: DC

## 2019-11-19 NOTE — Progress Notes (Signed)
GYNECOLOGY PROBLEM CARE ENCOUNTER NOTE  History:     Jennifer Leach is a 39 y.o. [phone] female here for evaluation of multiple complaints. She reports vaginal itching, abnormal vaginal discharge and new onset vaginal lesion.  Patient states these complaints began about five days ago. She also c/o irregular menstrual cycles and recurrent Bacterial Vaginosis which occurs at the end of her menstrual cycle. This has been a complaint since she had her tubal ligation 02/19/2017.  Patient was previously amenorrheic on Depo and desires restart today. Denies pelvic pain, problems with intercourse or other gynecologic concerns.    Gynecologic History Patient's last menstrual period was 11/14/2019 (approximate). Contraception: tubal ligation Last Pap: 11/18/2018. Results were: normal with negative HPV Last mammogram: N/A.   Obstetric History OB History  Gravida Para Term Preterm AB Living  4 4 4    0 4  SAB TAB Ectopic Multiple Live Births    0   0 4    # Outcome Date GA Lbr Len/2nd Weight Sex Delivery Anes PTL Lv  4 Term 02/19/17 [redacted]w[redacted]d 379:00 / 00:30 8 lb 3.2 oz (3.72 kg) F Vag-Spont EPI  LIV  3 Term 09/03/10    M Vag-Spont   LIV  2 Term 09/27/06    F Vag-Spont   LIV  1 Term 07/03/02    [facility]    Past Medical History:  Diagnosis Date  . Bacterial vaginitis 03/04/2017  . Gonorrhea   . Labial swelling 03/04/2017  . MRSA (methicillin resistant staph aureus) culture positive 08/06/2014    Past Surgical History:  Procedure Laterality Date  . NO PAST SURGERIES    . TUBAL LIGATION N/A 02/19/2017   Procedure: POST PARTUM TUBAL LIGATION;  Surgeon: Chancy Milroy, MD;  Location: [facility];  Service: Gynecology;  Laterality: N/A;  . WISDOM TOOTH EXTRACTION      Current Outpatient Medications on File Prior to Visit  Medication Sig Dispense Refill  . fluconazole (DIFLUCAN) 150 MG tablet Take 150 mg by mouth once.    . metroNIDAZOLE (METROGEL) 0.75 % vaginal gel  SMARTSIG:1 Applicator Vaginal Daily     No current facility-administered medications on file prior to visit.    Allergies  Allergen Reactions  . Septra Ds [Sulfamethoxazole-Trimethoprim] Anaphylaxis, Swelling, Rash and Other (See Comments)    Reaction:  Facial swelling     Social History:  reports that she has been smoking cigarettes. She has a 2.25 pack-year smoking history. She has never used smokeless tobacco. She reports that she does not drink alcohol or use drugs.  Family History  Problem Relation Age of Onset  . Hypertension Mother     The following portions of the patient's history were reviewed and updated as appropriate: allergies, current medications, past family history, past medical history, past social history, past surgical history and problem list.  Review of Systems Pertinent items noted in HPI and remainder of comprehensive ROS otherwise negative.  Physical Exam:  BP 123/81   Pulse 85   Ht 5\' 6"  (1.676 m)   Wt 156 lb 14.4 oz (71.2 kg)   LMP 11/14/2019 (Approximate)   BMI 25.32 kg/m  CONSTITUTIONAL: Well-developed, well-nourished female in no acute distress.  HENT:  Normocephalic, atraumatic, External right and left ear normal. Oropharynx is clear and moist EYES: Conjunctivae and EOM are normal. Pupils are equal, round, and reactive to light. No scleral icterus.  NECK: Normal range of motion, supple, no masses.  Normal thyroid.  SKIN: Skin  is warm and dry. No rash noted. Not diaphoretic. No erythema. No pallor. MUSCULOSKELETAL: Normal range of motion. No tenderness.  No cyanosis, clubbing, or edema.  2+ distal pulses. NEUROLOGIC: Alert and oriented to person, place, and time. Normal reflexes, muscle tone coordination.  PSYCHIATRIC: Normal mood and affect. Normal behavior. Normal judgment and thought content. CARDIOVASCULAR: Normal heart rate noted, regular rhythm RESPIRATORY: Clear to auscultation bilaterally. Effort and breath sounds normal, no problems with  respiration noted. BREASTS: Symmetric in size. No masses, tenderness, skin changes, nipple drainage, or lymphadenopathy bilaterally. ABDOMEN: Soft, no distention noted.  No tenderness, rebound or guarding.  PELVIC: Normal appearing external genitalia and urethral meatus; normal appearing vaginal mucosa and cervix.  Thick greenish white vaginal discharge noted.  No lesions identified on external or speculum exam today  Normal uterine size, no other palpable masses, no uterine or adnexal tenderness.   Assessment and Plan:    1. Vaginal itching - Cervicovaginal ancillary only( [place])  2. Vaginal lesion - Not present today - Patient encouraged to call clinic for HSV swab if recurrence of lesion(s)  3. Irregular periods - medroxyPROGESTERone (DEPO-PROVERA) injection 150 mg  4. Hx of chlamydia infection - Treated at [facility] 09/24/2019   5. Hx of tubal ligation  Will follow up on results of swabs as needed Routine preventative health maintenance measures emphasized. Please refer to After Visit Summary for other counseling recommendations.      Total visit time 45 minutes. Greater than 50% of visit spent in counseling and coordination of care.  Clayton Bibles, MSN, CNM Certified Nurse Midwife, [employer] for [employer], [facility] 11/19/19 3:10 PM

## 2019-11-19 NOTE — Progress Notes (Addendum)
GYN presents for vaginal itching, discharge, sores x 5 days.  Denies pelvic/abd. Pain and odor.  Rx did not work.  Office supplied DEPO (per provider) given in RUOQ, tolerated well.  Next DEPO April 28-Feb 18, 2020   Administrations This Visit    medroxyPROGESTERone (DEPO-PROVERA) injection 150 mg    Admin Date 11/19/2019 Action Given Dose 150 mg Route Intramuscular Administered By Maretta Bees, RMA

## 2019-11-19 NOTE — Patient Instructions (Addendum)
Preventive Care 21-37 Years Old, Female Preventive care refers to visits with your health care provider and lifestyle choices that can promote health and wellness. This includes:  A yearly physical exam. This may also be called an annual well check.  Regular dental visits and eye exams.  Immunizations.  Screening for certain conditions.  Healthy lifestyle choices, such as eating a healthy diet, getting regular exercise, not using drugs or products that contain nicotine and tobacco, and limiting alcohol use. What can I expect for my preventive care visit? Physical exam Your health care provider will check your:  Height and weight. This may be used to calculate body mass index (BMI), which tells if you are at a healthy weight.  Heart rate and blood pressure.  Skin for abnormal spots. Counseling Your health care provider may ask you questions about your:  Alcohol, tobacco, and drug use.  Emotional well-being.  Home and relationship well-being.  Sexual activity.  Eating habits.  Work and work environment.  Method of birth control.  Menstrual cycle.  Pregnancy history. What immunizations do I need?  Influenza (flu) vaccine  This is recommended every year. Tetanus, diphtheria, and pertussis (Tdap) vaccine  You may need a Td booster every 10 years. Varicella (chickenpox) vaccine  You may need this if you have not been vaccinated. Human papillomavirus (HPV) vaccine  If recommended by your health care provider, you may need three doses over 6 months. Measles, mumps, and rubella (MMR) vaccine  You may need at least one dose of MMR. You may also need a second dose. Meningococcal conjugate (MenACWY) vaccine  One dose is recommended if you are age 19-21 years and a first-year college student living in a residence hall, or if you have one of several medical conditions. You may also need additional booster doses. Pneumococcal conjugate (PCV13) vaccine  You may need  this if you have certain conditions and were not previously vaccinated. Pneumococcal polysaccharide (PPSV23) vaccine  You may need one or two doses if you smoke cigarettes or if you have certain conditions. Hepatitis A vaccine  You may need this if you have certain conditions or if you travel or work in places where you may be exposed to hepatitis A. Hepatitis B vaccine  You may need this if you have certain conditions or if you travel or work in places where you may be exposed to hepatitis B. Haemophilus influenzae type b (Hib) vaccine  You may need this if you have certain conditions. You may receive vaccines as individual doses or as more than one vaccine together in one shot (combination vaccines). Talk with your health care provider about the risks and benefits of combination vaccines. What tests do I need?  Blood tests  Lipid and cholesterol levels. These may be checked every 5 years starting at age 20.  Hepatitis C test.  Hepatitis B test. Screening  Diabetes screening. This is done by checking your blood sugar (glucose) after you have not eaten for a while (fasting).  Sexually transmitted disease (STD) testing.  BRCA-related cancer screening. This may be done if you have a family history of breast, ovarian, tubal, or peritoneal cancers.  Pelvic exam and Pap test. This may be done every 3 years starting at age 21. Starting at age 30, this may be done every 5 years if you have a Pap test in combination with an HPV test. Talk with your health care provider about your test results, treatment options, and if necessary, the need for more tests.   Follow these instructions at home: Eating and drinking   Eat a diet that includes fresh fruits and vegetables, whole grains, lean protein, and low-fat dairy.  Take vitamin and mineral supplements as recommended by your health care provider.  Do not drink alcohol if: ? Your health care provider tells you not to drink. ? You are  pregnant, may be pregnant, or are planning to become pregnant.  If you drink alcohol: ? Limit how much you have to 0-1 drink a day. ? Be aware of how much alcohol is in your drink. In the [place] one drink equals one 12 oz bottle of beer (355 mL), one 5 oz glass of wine (148 mL), or one 1 oz glass of hard liquor (44 mL). Lifestyle  Take daily care of your teeth and gums.  Stay active. Exercise for at least 30 minutes on 5 or more days each week.  Do not use any products that contain nicotine or tobacco, such as cigarettes, e-cigarettes, and chewing tobacco. If you need help quitting, ask your health care provider.  If you are sexually active, practice safe sex. Use a condom or other form of birth control (contraception) in order to prevent pregnancy and STIs (sexually transmitted infections). If you plan to become pregnant, see your health care provider for a preconception visit. What's next?  Visit your health care provider once a year for a well check visit.  Ask your health care provider how often you should have your eyes and teeth checked.  Stay up to date on all vaccines. This information is not intended to replace advice given to you by your health care provider. Make sure you discuss any questions you have with your health care provider. Document Revised: 06/06/2018 Document Reviewed: 06/06/2018 Elsevier Patient Education  [place].   Genital Herpes Genital herpes is a common sexually transmitted infection (STI) that is caused by a virus. The virus spreads from person to person through sexual contact. Infection can cause itching, blisters, and sores around the genitals or rectum. Symptoms may last several days and then go away This is called an outbreak. However, the virus remains in your body, so you may have more outbreaks in the future. The time between outbreaks varies and can be months or years. Genital herpes affects men and women. It is particularly concerning for  pregnant women because the virus can be passed to the baby during delivery and can cause serious problems. Genital herpes is also a concern for people who have a weak disease-fighting (immune) system. What are the causes? This condition is caused by the herpes simplex virus (HSV) type 1 or type 2. The virus may spread through:  Sexual contact with an infected person, including vaginal, anal, and oral sex.  Contact with fluid from a herpes sore.  The skin. This means that you can get herpes from an infected partner even if he or she does not have a visible sore or does not know that he or she is infected. What increases the risk? You are more likely to develop this condition if:  You have sex with many partners.  You do not use latex condoms during sex. What are the signs or symptoms? Most people do not have symptoms (asymptomatic) or have mild symptoms that may be mistaken for other skin problems. Symptoms may include:  Small red bumps near the genitals, rectum, or mouth. These bumps turn into blisters and then turn into sores.  Flu-like symptoms, including: ? Fever. ? Body  aches. ? Swollen lymph nodes. ? Headache.  Painful urination.  Pain and itching in the genital area or rectal area.  Vaginal discharge.  Tingling or shooting pain in the legs and buttocks. Generally, symptoms are more severe and last longer during the first (primary) outbreak. Flu-like symptoms are also more common during the primary outbreak. How is this diagnosed? Genital herpes may be diagnosed based on:  A physical exam.  Your medical history.  Blood tests.  A test of a fluid sample (culture) from an open sore. How is this treated? There is no cure for this condition, but treatment with antiviral medicines that are taken by mouth (orally) can do the following:  Speed up healing and relieve symptoms.  Help to reduce the spread of the virus to sexual partners.  Limit the chance of future  outbreaks, or make future outbreaks shorter.  Lessen symptoms of future outbreaks. Your health care provider may also recommend pain relief medicines, such as aspirin or ibuprofen. Follow these instructions at home: Sexual activity  Do not have sexual contact during active outbreaks.  Practice safe sex. Latex condoms and female condoms may help prevent the spread of the herpes virus. General instructions  Keep the affected areas dry and clean.  Take over-the-counter and prescription medicines only as told by your health care provider.  Avoid rubbing or touching blisters and sores. If you do touch blisters or sores: ? Wash your hands thoroughly with soap and water. ? Do not touch your eyes afterward.  To help relieve pain or itching, you may take the following actions as directed by your health care provider: ? Apply a cold, wet cloth (cold compress) to affected areas 4-6 times a day. ? Apply a substance that protects your skin and reduces bleeding (astringent). ? Apply a gel that helps relieve pain around sores (lidocaine gel). ? Take a warm, shallow bath that cleans the genital area (sitz bath).  Keep all follow-up visits as told by your health care provider. This is important. How is this prevented?  Use condoms. Although anyone can get genital herpes during sexual contact, even with the use of a condom, a condom can provide some protection.  Avoid having multiple sexual partners.  Talk with your sexual partner about any symptoms either of you may have. Also, talk with your partner about any history of STIs.  Get tested for STIs before you have sex. Ask your partner to do the same.  Do not have sexual contact if you have symptoms of genital herpes. Contact a health care provider if:  Your symptoms are not improving with medicine.  Your symptoms return.  You have new symptoms.  You have a fever.  You have abdominal pain.  You have redness, swelling, or pain in your  eye.  You notice new sores on other parts of your body.  You are a woman and experience bleeding between menstrual periods.  You have had herpes and you become pregnant or plan to become pregnant. Summary  Genital herpes is a common sexually transmitted infection (STI) that is caused by the herpes simplex virus (HSV) type 1 or type 2.  These viruses are most often spread through sexual contact with an infected person.  You are more likely to develop this condition if you have sex with many partners or you have unprotected sex.  Most people do not have symptoms (asymptomatic) or have mild symptoms that may be mistaken for other skin problems. Symptoms occur as outbreaks that may  happen months or years apart.  There is no cure for this condition, but treatment with oral antiviral medicines can reduce symptoms, reduce the chance of spreading the virus to a partner, prevent future outbreaks, or shorten future outbreaks. This information is not intended to replace advice given to you by your health care provider. Make sure you discuss any questions you have with your health care provider. Document Revised: 04/01/2018 Document Reviewed: 08/25/2016 Elsevier Patient Education  [place].

## 2019-11-20 ENCOUNTER — Other Ambulatory Visit: Payer: Self-pay | Admitting: *Deleted

## 2019-11-20 ENCOUNTER — Ambulatory Visit (INDEPENDENT_AMBULATORY_CARE_PROVIDER_SITE_OTHER): Payer: BC Managed Care – PPO

## 2019-11-20 ENCOUNTER — Encounter: Payer: Self-pay | Admitting: Advanced Practice Midwife

## 2019-11-20 DIAGNOSIS — N760 Acute vaginitis: Secondary | ICD-10-CM

## 2019-11-20 DIAGNOSIS — B379 Candidiasis, unspecified: Secondary | ICD-10-CM

## 2019-11-20 DIAGNOSIS — B9689 Other specified bacterial agents as the cause of diseases classified elsewhere: Secondary | ICD-10-CM

## 2019-11-20 DIAGNOSIS — A549 Gonococcal infection, unspecified: Secondary | ICD-10-CM

## 2019-11-20 LAB — CERVICOVAGINAL ANCILLARY ONLY
Bacterial Vaginitis (gardnerella): POSITIVE — AB
Candida Glabrata: POSITIVE — AB
Candida Vaginitis: POSITIVE — AB
Chlamydia: NEGATIVE
Comment: NEGATIVE
Comment: NEGATIVE
Comment: NEGATIVE
Comment: NEGATIVE
Comment: NEGATIVE
Comment: NORMAL
Neisseria Gonorrhea: POSITIVE — AB
Trichomonas: NEGATIVE

## 2019-11-20 MED ORDER — CEFTRIAXONE SODIUM 250 MG IJ SOLR
250.0000 mg | Freq: Once | INTRAMUSCULAR | Status: AC
  Administered 2019-11-20: 250 mg via INTRAMUSCULAR

## 2019-11-20 MED ORDER — CEFTRIAXONE SODIUM 250 MG IJ SOLR
250.0000 mg | Freq: Once | INTRAMUSCULAR | Status: AC
  Administered 2019-11-20: 17:00:00 250 mg via INTRAMUSCULAR

## 2019-11-20 MED ORDER — METRONIDAZOLE 500 MG PO TABS: 500.0000 mg | ORAL_TABLET | Freq: Two times a day (BID) | ORAL | 0 refills | Status: DC

## 2019-11-20 MED ORDER — FLUCONAZOLE 150 MG PO TABS: 150.0000 mg | ORAL_TABLET | Freq: Once | ORAL | 0 refills | Status: AC

## 2019-11-20 MED ORDER — METRONIDAZOLE 0.75 % VA GEL: 1.0000 | Freq: Every day | VAGINAL | 1 refills | Status: DC

## 2019-11-20 NOTE — Progress Notes (Signed)
Pt presents for Rocephin inj for positive GC. Ceftriaxone 250 mg reconstituted with 0.9 ml 1% lidocaine x 2 for 500 mg dosage. Pt tolerated well. Inj given in LUOQ. Patient advised to abstain from IC for 7 days after her partner gets treated.  Pt also request for metrogel be sent to the pharmacy instead of the tablets. Rx changed.

## 2019-11-20 NOTE — Progress Notes (Signed)
Pt made aware of lab results. Scheduled for Rocephin. Flagyl and Diflucan sent today.

## 2019-12-03 ENCOUNTER — Ambulatory Visit: Payer: Medicaid Other | Admitting: Advanced Practice Midwife

## 2019-12-24 ENCOUNTER — Ambulatory Visit: Payer: Medicaid Other | Admitting: Obstetrics and Gynecology

## 2020-02-04 ENCOUNTER — Ambulatory Visit: Payer: PRIVATE HEALTH INSURANCE | Admitting: Obstetrics and Gynecology

## 2020-02-11 ENCOUNTER — Ambulatory Visit: Payer: Medicaid Other

## 2020-02-19 ENCOUNTER — Ambulatory Visit (INDEPENDENT_AMBULATORY_CARE_PROVIDER_SITE_OTHER): Payer: BC Managed Care – PPO

## 2020-02-19 ENCOUNTER — Other Ambulatory Visit: Payer: Self-pay

## 2020-02-19 DIAGNOSIS — Z30013 Encounter for initial prescription of injectable contraceptive: Secondary | ICD-10-CM

## 2020-02-19 MED ORDER — MEDROXYPROGESTERONE ACETATE 150 MG/ML IM SUSP
150.0000 mg | Freq: Once | INTRAMUSCULAR | Status: AC
  Administered 2020-02-19: 150 mg via INTRAMUSCULAR

## 2020-02-19 NOTE — Progress Notes (Signed)
Jennifer Leach is here or a depo injection. Pt tolerated injection well without complication. -EH/RMA

## 2020-02-26 ENCOUNTER — Ambulatory Visit: Payer: BC Managed Care – PPO

## 2020-03-15 ENCOUNTER — Telehealth: Payer: Self-pay

## 2020-03-15 MED ORDER — FLUCONAZOLE 150 MG PO TABS: 150.0000 mg | ORAL_TABLET | Freq: Once | ORAL | 0 refills | Status: AC

## 2020-03-15 NOTE — Telephone Encounter (Signed)
Pt called and report that she has been bleeding every day since her first depot injection in February. Pt reports sometimes the bleeding is heavy like a period and sometimes just light spotting. Pt is due for annual exam, I advised pt I will let schedulers know to reach out to her to make appt for annual and to talk about bleeding. Pt also reports vaginal itching and burning currently. I advised pt that I will send her medication for yeast infection per protocol. Advised pt to let us know in a couple days if symptoms have not cleared up and we will need to bring her in for evaluation, pt voices understanding. Message sent to schedulers.

## 2020-03-18 ENCOUNTER — Telehealth: Payer: Self-pay

## 2020-03-18 DIAGNOSIS — B9689 Other specified bacterial agents as the cause of diseases classified elsewhere: Secondary | ICD-10-CM

## 2020-03-18 MED ORDER — METRONIDAZOLE 0.75 % VA GEL: 1.0000 | Freq: Every day | VAGINAL | 0 refills | Status: DC

## 2020-03-18 NOTE — Telephone Encounter (Signed)
Pt c/o malodorous vaginal discharge.  Pt states she has hx of BV infections Pt prefers Metrogel. She can not tolerate the pills.  Rx sent to the pharmacy per protocol.

## 2020-03-22 ENCOUNTER — Other Ambulatory Visit: Payer: Self-pay

## 2020-03-22 ENCOUNTER — Ambulatory Visit (INDEPENDENT_AMBULATORY_CARE_PROVIDER_SITE_OTHER): Payer: BC Managed Care – PPO | Admitting: Obstetrics & Gynecology

## 2020-03-22 ENCOUNTER — Encounter: Payer: Self-pay | Admitting: Obstetrics & Gynecology

## 2020-03-22 VITALS — BP 119/79 | HR 85 | Wt 166.0 lb

## 2020-03-22 DIAGNOSIS — N921 Excessive and frequent menstruation with irregular cycle: Secondary | ICD-10-CM | POA: Diagnosis not present

## 2020-03-22 DIAGNOSIS — N939 Abnormal uterine and vaginal bleeding, unspecified: Secondary | ICD-10-CM

## 2020-03-22 MED ORDER — NAPROXEN 500 MG PO TABS: 500.0000 mg | ORAL_TABLET | Freq: Two times a day (BID) | ORAL | 2 refills | Status: DC

## 2020-03-22 MED ORDER — TRANEXAMIC ACID 650 MG PO TABS: 1300.0000 mg | ORAL_TABLET | Freq: Three times a day (TID) | ORAL | 2 refills | Status: DC

## 2020-03-22 NOTE — Progress Notes (Signed)
GYNECOLOGY OFFICE VISIT NOTE  History:   Jennifer Leach is a 39 y.o. [phone] here today for management of prolonged bleeding despite Depo Provera.  Started this in 11/2019 for AUB, it is improved but she still bleeds daily.  Sometimes, can be heavy with clots.  Desires to stop bleeding all the time. She denies any dizziness, lightheadedness, pelvic pain or other concerns.    Past Medical History:  Diagnosis Date  . Bacterial vaginitis 03/04/2017  . Gonorrhea   . Labial swelling 03/04/2017  . MRSA (methicillin resistant staph aureus) culture positive 08/06/2014    Past Surgical History:  Procedure Laterality Date  . NO PAST SURGERIES    . TUBAL LIGATION N/A 02/19/2017   Procedure: POST PARTUM TUBAL LIGATION;  Surgeon: [name], MD;  Location[facility];  Service: Gynecology;  Laterality: N/A;  . WISDOM TOOTH EXTRACTION      The following portions of the patient's history were reviewed and updated as appropriate: allergies, current medications, past family history, past medical history, past social history, past surgical history and problem list.   Health Maintenance:  Normal pap and negative HRHPV on 11/18/2018  Review of Systems:  Pertinent items noted in HPI and remainder of comprehensive ROS otherwise negative.  Physical Exam:  BP 119/79   Pulse 85   Wt 166 lb (75.3 kg)   BMI 26.79 kg/m  CONSTITUTIONAL: Well-developed, well-nourished female in no acute distress.  HEENT:  Normocephalic, atraumatic. External right and left ear normal. No scleral icterus.  NECK: Normal range of motion, supple, no masses noted on observation SKIN: No rash noted. Not diaphoretic. No erythema. No pallor. MUSCULOSKELETAL: Normal range of motion. No edema noted. NEUROLOGIC: Alert and oriented to person, place, and time. Normal muscle tone coordination. No cranial nerve deficit noted. PSYCHIATRIC: Normal mood and affect. Normal behavior. Normal judgment and thought  content. CARDIOVASCULAR: Normal heart rate noted RESPIRATORY: Effort and breath sounds normal, no problems with respiration noted ABDOMEN: No masses noted. No other overt distention noted.   PELVIC: Deferred     Assessment and Plan:      1. Abnormal uterine bleeding (AUB) 2. Breakthrough bleeding on Depo-Provera Counseled patient about taking estrogen or OCP course to help but she wants to avoid further hormones. Discussed trial of Lysteda and Naproxen, will evaluate response. If still having bleeding, will re-visit homonal options (including IUD which she declined today) or surgical management (endometrial ablation vs hysterectomy). Pelvic ultrasound ordered for further evaluation. Of note, patient has history of GC in 11/2019, no TOC yet. Declined cervicovaginal testing today (has cream for BV treatment in place). - US PELVIC COMPLETE WITH TRANSVAGINAL; Future - tranexamic acid (LYSTEDA) 650 MG TABS tablet; Take 2 tablets (1,300 mg total) by mouth 3 (three) times daily. Take during menses for a maximum of five days  Dispense: 30 tablet; Refill: 2 - naproxen (NAPROSYN) 500 MG tablet; Take 1 tablet (500 mg total) by mouth 2 (two) times daily with a meal. As needed for pain  Dispense: 60 tablet; Refill: 2  Routine preventative health maintenance measures emphasized, scheduled for annual exam soon  (needs TOC for GC then). Please refer to After Visit Summary for other counseling recommendations.   Return for Annual exam in 04/2020 as scheduled.    Total face-to-face time with patient: 20 minutes.  Over 50% of encounter was spent on counseling and coordination of care.   Jaynie Collins, MD, FACOG Obstetrician & Gynecologist, [facility] for [employer],  [place]

## 2020-03-22 NOTE — Patient Instructions (Signed)
Return to clinic for any scheduled appointments or for any gynecologic concerns as needed.    Abnormal Uterine Bleeding Abnormal uterine bleeding is unusual bleeding from the uterus. It includes:  Bleeding or spotting between periods.  Bleeding after sex.  Bleeding that is heavier than normal.  Periods that last longer than usual.  Bleeding after menopause. Abnormal uterine bleeding can affect women at various stages in life, including teenagers, women in their reproductive years, pregnant women, and women who have reached menopause. Common causes of abnormal uterine bleeding include:  Pregnancy.  Growths of tissue (polyps).  A noncancerous tumor in the uterus (fibroid).  Infection.  Cancer.  Hormonal imbalances. Any type of abnormal bleeding should be evaluated by a health care provider. Many cases are minor and simple to treat, while others are more serious. Treatment will depend on the cause of the bleeding. Follow these instructions at home:  Monitor your condition for any changes.  Do not use tampons, douche, or have sex if told by your health care provider.  Change your pads often.  Get regular exams that include pelvic exams and cervical cancer screening.  Keep all follow-up visits as told by your health care provider. This is important. Contact a health care provider if:  Your bleeding lasts for more than one week.  You feel dizzy at times.  You feel nauseous or you vomit. Get help right away if:  You pass out.  Your bleeding soaks through a pad every hour.  You have abdominal pain.  You have a fever.  You become sweaty or weak.  You pass large blood clots from your vagina. Summary  Abnormal uterine bleeding is unusual bleeding from the uterus.  Any type of abnormal bleeding should be evaluated by a health care provider. Many cases are minor and simple to treat, while others are more serious.  Treatment will depend on the cause of the  bleeding. This information is not intended to replace advice given to you by your health care provider. Make sure you discuss any questions you have with your health care provider. Document Revised: 01/02/2018 Document Reviewed: 10/27/2016 Elsevier Patient Education  2020 [employer]vier Inc.  

## 2020-03-31 ENCOUNTER — Ambulatory Visit: Admission: RE | Admit: 2020-03-31 | Payer: BC Managed Care – PPO | Source: Ambulatory Visit

## 2020-04-06 ENCOUNTER — Other Ambulatory Visit: Payer: Self-pay

## 2020-04-06 ENCOUNTER — Encounter (HOSPITAL_COMMUNITY): Payer: Self-pay | Admitting: Emergency Medicine

## 2020-04-06 ENCOUNTER — Ambulatory Visit (HOSPITAL_COMMUNITY)
Admission: EM | Admit: 2020-04-06 | Discharge: 2020-04-06 | Disposition: A | Discharge status: Home / Self Care | Payer: BC Managed Care – PPO | Attending: Urgent Care | Admitting: Urgent Care

## 2020-04-06 DIAGNOSIS — Z20822 Contact with and (suspected) exposure to covid-19: Secondary | ICD-10-CM | POA: Insufficient documentation

## 2020-04-06 DIAGNOSIS — J069 Acute upper respiratory infection, unspecified: Secondary | ICD-10-CM | POA: Diagnosis not present

## 2020-04-06 DIAGNOSIS — J3489 Other specified disorders of nose and nasal sinuses: Secondary | ICD-10-CM | POA: Diagnosis present

## 2020-04-06 LAB — SARS CORONAVIRUS 2 (TAT 6-24 HRS): SARS Coronavirus 2: NEGATIVE

## 2020-04-06 MED ORDER — BENZONATATE 100 MG PO CAPS: 100.0000 mg | ORAL_CAPSULE | Freq: Three times a day (TID) | ORAL | 0 refills | Status: DC | PRN

## 2020-04-06 MED ORDER — PROMETHAZINE-DM 6.25-15 MG/5ML PO SYRP: 5.0000 mL | ORAL_SOLUTION | Freq: Every evening | ORAL | 0 refills | Status: DC | PRN

## 2020-04-06 MED ORDER — CETIRIZINE HCL 10 MG PO TABS: 10.0000 mg | ORAL_TABLET | Freq: Every day | ORAL | 0 refills | Status: DC

## 2020-04-06 MED ORDER — PSEUDOEPHEDRINE HCL 60 MG PO TABS: 60.0000 mg | ORAL_TABLET | Freq: Three times a day (TID) | ORAL | 0 refills | Status: DC | PRN

## 2020-04-06 NOTE — ED Notes (Signed)
covid sample in lab 

## 2020-04-06 NOTE — ED Provider Notes (Signed)
[facility]   MRN: [id number] DOB: [birth year]  Subjective:   Jennifer Leach is a 39 y.o. female presenting for 3-day history of runny and stuffy nose, throat pain, cough.  Has also had intermittent ear fullness bilaterally.  Patient has used over-the-counter cold medicine with minimal relief.  Denies history of asthma or COPD.  She used to smoke cigarettes but no longer does this.  No current facility-administered medications for this encounter.  Current Outpatient Medications:  .  guaiFENesin (MUCINEX) 600 MG 12 hr tablet, Take by mouth 2 (two) times daily., Disp: , Rfl:  .  Pseudoeph-Doxylamine-DM-APAP (NYQUIL PO), Take by mouth., Disp: , Rfl:  .  medroxyPROGESTERone (DEPO-PROVERA) 150 MG/ML injection, Inject 1 mL (150 mg total) into the muscle every 3 (three) months., Disp: 1 mL, Rfl: 0 .  metroNIDAZOLE (METROGEL) 0.75 % vaginal gel, SMARTSIG:1 Applicator Vaginal Daily, Disp: , Rfl:  .  metroNIDAZOLE (METROGEL) 0.75 % vaginal gel, Place 1 Applicatorful vaginally at bedtime. Apply one applicatorful to vagina at bedtime for 5 days, Disp: 70 g, Rfl: 0 .  naproxen (NAPROSYN) 500 MG tablet, Take 1 tablet (500 mg total) by mouth 2 (two) times daily with a meal. As needed for pain, Disp: 60 tablet, Rfl: 2 .  tranexamic acid (LYSTEDA) 650 MG TABS tablet, Take 2 tablets (1,300 mg total) by mouth 3 (three) times daily. Take during menses for a maximum of five days, Disp: 30 tablet, Rfl: 2   Allergies  Allergen Reactions  . Septra Ds [Sulfamethoxazole-Trimethoprim] Anaphylaxis, Swelling, Rash and Other (See Comments)    Reaction:  Facial swelling     Past Medical History:  Diagnosis Date  . Bacterial vaginitis 03/04/2017  . Gonorrhea   . Labial swelling 03/04/2017  . MRSA (methicillin resistant staph aureus) culture positive 08/06/2014     Past Surgical History:  Procedure Laterality Date  . NO PAST SURGERIES    . TUBAL LIGATION N/A 02/19/2017   Procedure: POST PARTUM TUBAL  LIGATION;  Surgeon: [name], MD;  Location: [facility] SUIT[facility];  Service: Gynecology;  Laterality: N/A;  . WISDOM TOOTH EXTRACTION      Family History  Problem Relation Age of Onset  . Hypertension Mother     Social History   Tobacco Use  . Smoking status: Former Smoker    Packs/day: 0.25    Years: 9.00    Pack years: 2.25    Types: Cigarettes    Quit date: 06/09/2012    Years since quitting: 7.8  . Smokeless tobacco: Never Used  . Tobacco comment: pt reports is is smoking   Vaping Use  . Vaping Use: Never used  Substance Use Topics  . Alcohol use: No  . Drug use: No    ROS Denies fever, chest pain, shortness of breath.  Objective:   Vitals: BP 123/76 (BP Location: Right Arm)   Pulse 75   Temp 98.7 F (37.1 C) (Oral)   Resp 18   LMP 12/07/2019 Comment: patient has medicine to stop period  SpO2 100%   Physical Exam Constitutional:      General: She is not in acute distress.    Appearance: Normal appearance. She is well-developed. She is not ill-appearing, toxic-appearing or diaphoretic.  HENT:     Head: Normocephalic and atraumatic.     Right Ear: Tympanic membrane and ear canal normal. No drainage or tenderness. No middle ear effusion. Tympanic membrane is not erythematous.     Left Ear: Tympanic membrane and ear canal  normal. No drainage or tenderness.  No middle ear effusion. Tympanic membrane is not erythematous.     Nose: Nose normal. No congestion or rhinorrhea.     Mouth/Throat:     Mouth: Mucous membranes are moist. No oral lesions.     Pharynx: No pharyngeal swelling, oropharyngeal exudate, posterior oropharyngeal erythema or uvula swelling.     Tonsils: No tonsillar exudate or tonsillar abscesses.     Comments: Significant postnasal drainage overlying pharynx. Eyes:     General: No scleral icterus.       Right eye: No discharge.        Left eye: No discharge.     Extraocular Movements: Extraocular movements intact.     Right eye: Normal  extraocular motion.     Left eye: Normal extraocular motion.     Conjunctiva/sclera: Conjunctivae normal.     Pupils: Pupils are equal, round, and reactive to light.  Cardiovascular:     Rate and Rhythm: Normal rate and regular rhythm.     Pulses: Normal pulses.     Heart sounds: Normal heart sounds. No murmur heard.  No friction rub. No gallop.   Pulmonary:     Effort: Pulmonary effort is normal. No respiratory distress.     Breath sounds: Normal breath sounds. No stridor. No wheezing, rhonchi or rales.  Musculoskeletal:     Cervical back: Normal range of motion and neck supple.  Lymphadenopathy:     Cervical: No cervical adenopathy.  Skin:    General: Skin is warm and dry.     Findings: No rash.  Neurological:     General: No focal deficit present.     Mental Status: She is alert and oriented to person, place, and time.  Psychiatric:        Mood and Affect: Mood normal.        Behavior: Behavior normal.        Thought Content: Thought content normal.        Judgment: Judgment normal.      Assessment and Plan :   PDMP not reviewed this encounter.  1. Viral URI with cough   2. Stuffy and runny nose     Will manage for viral illness such as viral URI, viral syndrome, viral rhinitis, COVID-19. Counseled patient on nature of COVID-19 including modes of transmission, diagnostic testing, management and supportive care.  Offered scripts for symptomatic relief. COVID 19 testing is pending. Counseled patient on potential for adverse effects with medications prescribed/recommended today, ER and return-to-clinic precautions discussed, patient verbalized understanding.     Wallis Bamberg, PA-C 04/06/20 1728

## 2020-04-06 NOTE — Discharge Instructions (Signed)

## 2020-04-06 NOTE — ED Triage Notes (Signed)
Sore throat and cough at night, nose running.  Coughing up green phlegm, denies fever, ears feel stuffy.  Denies nausea and vomiting.    Patient did not get the covid vaccine

## 2020-04-14 ENCOUNTER — Other Ambulatory Visit (HOSPITAL_COMMUNITY)
Admission: RE | Admit: 2020-04-14 | Discharge: 2020-04-14 | Disposition: A | Discharge status: Home / Self Care | Payer: BC Managed Care – PPO | Source: Ambulatory Visit | Attending: Obstetrics and Gynecology | Admitting: Obstetrics and Gynecology

## 2020-04-14 ENCOUNTER — Ambulatory Visit (INDEPENDENT_AMBULATORY_CARE_PROVIDER_SITE_OTHER): Payer: BC Managed Care – PPO | Admitting: Obstetrics and Gynecology

## 2020-04-14 ENCOUNTER — Other Ambulatory Visit: Payer: Self-pay

## 2020-04-14 ENCOUNTER — Encounter: Payer: Self-pay | Admitting: Obstetrics and Gynecology

## 2020-04-14 VITALS — BP 128/88 | HR 78 | Ht 66.0 in | Wt 161.0 lb

## 2020-04-14 DIAGNOSIS — L732 Hidradenitis suppurativa: Secondary | ICD-10-CM | POA: Diagnosis not present

## 2020-04-14 DIAGNOSIS — N921 Excessive and frequent menstruation with irregular cycle: Secondary | ICD-10-CM | POA: Insufficient documentation

## 2020-04-14 DIAGNOSIS — Z113 Encounter for screening for infections with a predominantly sexual mode of transmission: Secondary | ICD-10-CM | POA: Diagnosis present

## 2020-04-14 DIAGNOSIS — Z01419 Encounter for gynecological examination (general) (routine) without abnormal findings: Secondary | ICD-10-CM

## 2020-04-14 MED ORDER — ETHYNODIOL DIAC-ETH ESTRADIOL 1-35 MG-MCG PO TABS: 1.0000 | ORAL_TABLET | Freq: Every day | ORAL | 1 refills | Status: DC

## 2020-04-14 NOTE — Patient Instructions (Signed)
Hidradenitis Suppurativa Hidradenitis suppurativa is a long-term (chronic) skin disease. It is similar to a severe form of acne, but it affects areas of the body where acne would be unusual, especially areas of the body where skin rubs against skin and becomes moist. These include:  Underarms.  Groin.  Genital area.  Buttocks.  Upper thighs.  Breasts. Hidradenitis suppurativa may start out as small lumps or pimples caused by blocked sweat glands or hair follicles. Pimples may develop into deep sores that break open (rupture) and drain pus. Over time, affected areas of skin may thicken and become scarred. This condition is rare and does not spread from person to person (non-contagious). What are the causes? The exact cause of this condition is not known. It may be related to:  Female and female hormones.  An overactive disease-fighting system (immune system). The immune system may over-react to blocked hair follicles or sweat glands and cause swelling and pus-filled sores. What increases the risk? You are more likely to develop this condition if you:  Are female.  Are 11-55 years old.  Have a family history of hidradenitis suppurativa.  Have a personal history of acne.  Are overweight.  Smoke.  Take the medicine lithium. What are the signs or symptoms? The first symptoms are usually painful bumps in the skin, similar to pimples. The condition may get worse over time (progress), or it may only cause mild symptoms. If the disease progresses, symptoms may include:  Skin bumps getting bigger and growing deeper into the skin.  Bumps rupturing and draining pus.  Itchy, infected skin.  Skin getting thicker and scarred.  Tunnels under the skin (fistulas) where pus drains from a bump.  Pain during daily activities, such as pain during walking if your groin area is affected.  Emotional problems, such as stress or depression. This condition may affect your appearance and your  ability or willingness to wear certain clothes or do certain activities. How is this diagnosed? This condition is diagnosed by a health care provider who specializes in skin diseases (dermatologist). You may be diagnosed based on:  Your symptoms and medical history.  A physical exam.  Testing a pus sample for infection.  Blood tests. How is this treated? Your treatment will depend on how severe your symptoms are. The same treatment will not work for everybody with this condition. You may need to try several treatments to find what works best for you. Treatment may include:  Cleaning and bandaging (dressing) your wounds as needed.  Lifestyle changes, such as new skin care routines.  Taking medicines, such as: ? Antibiotics. ? Acne medicines. ? Medicines to reduce the activity of the immune system. ? A diabetes medicine (metformin). ? Birth control pills, for women. ? Steroids to reduce swelling and pain.  Working with a mental health care provider, if you experience emotional distress due to this condition. If you have severe symptoms that do not get better with medicine, you may need surgery. Surgery may involve:  Using a laser to clear the skin and remove hair follicles.  Opening and draining deep sores.  Removing the areas of skin that are diseased and scarred. Follow these instructions at home: Medicines   Take over-the-counter and prescription medicines only as told by your health care provider.  If you were prescribed an antibiotic medicine, take it as told by your health care provider. Do not stop taking the antibiotic even if your condition improves. Skin care  If you have open wounds, cover   them with a clean dressing as told by your health care provider. Keep wounds clean by washing them gently with soap and water when you bathe.  Do not shave the areas where you get hidradenitis suppurativa.  Do not wear deodorant.  Wear loose-fitting clothes.  Try to avoid  getting overheated or sweaty. If you get sweaty or wet, change into clean, dry clothes as soon as you can.  To help relieve pain and itchiness, cover sore areas with a warm, clean washcloth (warm compress) for 5-10 minutes as often as needed.  If told by your health care provider, take a bleach bath twice a week: ? Fill your bathtub halfway with water. ? Pour in  cup of unscented household bleach. ? Soak in the tub for 5-10 minutes. ? Only soak from the neck down. Avoid water on your face and hair. ? Shower to rinse off the bleach from your skin. General instructions  Learn as much as you can about your disease so that you have an active role in your treatment. Work closely with your health care provider to find treatments that work for you.  If you are overweight, work with your health care provider to lose weight as recommended.  Do not use any products that contain nicotine or tobacco, such as cigarettes and e-cigarettes. If you need help quitting, ask your health care provider.  If you struggle with living with this condition, talk with your health care provider or work with a mental health care provider as recommended.  Keep all follow-up visits as told by your health care provider. This is important. Where to find more information  Hidradenitis Suppurativa Foundation, Inc.: https://www.hs-foundation.org/ Contact a health care provider if you have:  A flare-up of hidradenitis suppurativa.  A fever or chills.  Trouble controlling your symptoms at home.  Trouble doing your daily activities because of your symptoms.  Trouble dealing with emotional problems related to your condition. Summary  Hidradenitis suppurativa is a long-term (chronic) skin disease. It is similar to a severe form of acne, but it affects areas of the body where acne would be unusual.  The first symptoms are usually painful bumps in the skin, similar to pimples. The condition may get worse over time  (progress), or it may only cause mild symptoms.  If you have open wounds, cover them with a clean dressing as told by your health care provider. Keep wounds clean by washing them gently with soap and water when you bathe.  Besides skin care, treatment may include medicines, laser treatment, and surgery. This information is not intended to replace advice given to you by your health care provider. Make sure you discuss any questions you have with your health care provider. Document Revised: 10/03/2017 Document Reviewed: 10/03/2017 Elsevier Patient Education  2020 Elsevier Inc.  

## 2020-04-14 NOTE — Progress Notes (Signed)
WELL-WOMAN PHYSICAL & PAP Patient name: Jennifer Leach MRN [id number]  Date of birth: [birth year] Chief Complaint:   Gynecologic Exam  History of Present Illness:   Jennifer Leach is a 39 y.o. G33P4004 African American female being seen today for a routine well-woman exam.  Current complaints: BTB since starting Depo in 11/2019. She was Rx'd Lysteda, but stopped taking it. She states "they did work when I took them, but I couldn't remember to take all of those pills." She prefers to stay on Depo-Provera and is willing to try something else. She also complains of boils under her arms that "come and go and hurt when they get really big."  PCP: none      does desire labs No LMP recorded. Patient has had an injection. The current method of family planning is Depo-Provera injections.  Last pap 11/2018. Results were: normal Last mammogram: n/a. Results were: n/a. Family h/o breast cancer: No Last colonoscopy: n/a. Results were: n/a. Family h/o colorectal cancer: No Review of Systems:   Pertinent items are noted in HPI Denies any headaches, blurred vision, fatigue, shortness of breath, chest pain, abdominal pain, abnormal vaginal discharge/itching/odor/irritation, problems with periods, bowel movements, urination, or intercourse unless otherwise stated above. Pertinent History Reviewed:  Reviewed past medical,surgical, social and family history.  Reviewed problem list, medications and allergies. Physical Assessment:   Vitals:   04/14/20 0839  BP: 128/88  Pulse: 78  Weight: 161 lb (73 kg)  Height: 5\' 6"  (1.676 m)  Body mass index is 25.99 kg/m.        Physical Examination:   General appearance - well appearing, and in no distress  Mental status - alert, oriented to person, place, and time  Psych:  She has a normal mood and affect  Skin - warm and dry, normal color, no suspicious lesions noted  Chest - effort normal, all lung fields clear to auscultation bilaterally  Heart - normal  rate and regular rhythm  Neck:  midline trachea, no thyromegaly or nodules  Breasts - breasts appear normal, no suspicious masses, no skin or nipple changes or  axillary nodes  Abdomen - soft, nontender, nondistended, no masses or organomegaly  Pelvic - VULVA: normal appearing vulva with no masses, tenderness or lesions  VAGINA: normal appearing vagina with normal color and discharge, no lesions  CERVIX: normal appearing cervix without discharge or lesions, no CMT  Thin prep pap is not done   UTERUS: uterus is felt to be normal size, shape, consistency and nontender   ADNEXA: No adnexal masses or tenderness noted.  Rectal - deferred  Extremities:  No swelling or varicosities noted  No results found for this or any previous visit (from the past 24 hour(s)).  Assessment & Plan:  1) Well-Woman Exam without Pap  2) Routine screening for STI (sexually transmitted infection) - Cervicovaginal ancillary only( [place]) - Will treat according to results  3) Breakthrough bleeding on Depo-Provera - Rx for ethynodiol-ethinyl estradiol (ZOVIA) 1-35 MG-MCG tablet; Take 1 tablet by mouth daily. DO NOT TAKE THE INACTIVE (LAST WEEK) OF PILLS. START ON NEW PACK WHEN DONE WITH 1st 3 WEEKS IN PACK  Dispense: 30 tablet; Refill: 1  4) Hidradenitis - Information provided on hidradenitis suppurativa  - Ambulatory referral to Dermatology   Labs/procedures today: wet prep  Mammogram at age 85 or sooner if problems Colonoscopy at age 35 or sooner if problems  No orders of the defined types were placed in this encounter.  Meds:  Meds ordered this encounter  Medications  . ethynodiol-ethinyl estradiol (ZOVIA) 1-35 MG-MCG tablet    Sig: Take 1 tablet by mouth daily. DO NOT TAKE THE INACTIVE (LAST WEEK) OF PILLS. START ON NEW PACK WHEN DONE WITH 1st 3 WEEKS IN PACK    Dispense:  30 tablet    Refill:  1    Order Specific Question:   Supervising Provider    Answer:   Reva Bores [[id number]]     Follow-up: Return in about 1 year (around 04/14/2021) for Annual Exam.  Raelyn Mora MSN, CNM 04/14/2020 9:23 AM

## 2020-04-14 NOTE — Progress Notes (Signed)
Patient presents for Annual Exam today.  Last pap: 11/18/2018 WNL   LMP: HAS BEEN BLEEDING WITH   DEPO SINCE FEB 2020./[place].   *Pt states she is no longer taking Lysteda.   STD Screening: Desires Vaginal swab only.  Family Hx of Breast Cancer: NONE

## 2020-04-15 ENCOUNTER — Other Ambulatory Visit: Payer: BC Managed Care – PPO

## 2020-04-15 ENCOUNTER — Other Ambulatory Visit: Payer: Self-pay | Admitting: Obstetrics and Gynecology

## 2020-04-15 DIAGNOSIS — B9689 Other specified bacterial agents as the cause of diseases classified elsewhere: Secondary | ICD-10-CM

## 2020-04-15 LAB — CERVICOVAGINAL ANCILLARY ONLY
Bacterial Vaginitis (gardnerella): POSITIVE — AB
Candida Glabrata: NEGATIVE
Candida Vaginitis: NEGATIVE
Chlamydia: NEGATIVE
Comment: NEGATIVE
Comment: NEGATIVE
Comment: NEGATIVE
Comment: NEGATIVE
Comment: NEGATIVE
Comment: NORMAL
Neisseria Gonorrhea: NEGATIVE
Trichomonas: NEGATIVE

## 2020-04-15 MED ORDER — METRONIDAZOLE 0.75 % VA GEL: 1.0000 | Freq: Every day | VAGINAL | 0 refills | Status: AC

## 2020-04-26 ENCOUNTER — Telehealth: Payer: Self-pay | Admitting: Physician Assistant

## 2020-04-26 ENCOUNTER — Other Ambulatory Visit: Payer: Self-pay

## 2020-04-26 DIAGNOSIS — B379 Candidiasis, unspecified: Secondary | ICD-10-CM

## 2020-04-26 MED ORDER — FLUCONAZOLE 150 MG PO TABS: 150.0000 mg | ORAL_TABLET | Freq: Once | ORAL | 0 refills | Status: AC

## 2020-04-26 NOTE — Telephone Encounter (Signed)
Scheduled her w/J[facility] 10/13/20 @3 :15. She said her Dr sent over a referral

## 2020-05-04 ENCOUNTER — Telehealth: Payer: Self-pay | Admitting: *Deleted

## 2020-05-04 NOTE — Telephone Encounter (Signed)
Pt called to office stating she has been taking BC pills as prescribed to help with bleeding while using depo.  Pt states that she is still bleeding and hasn't stopped since Feb.

## 2020-05-05 ENCOUNTER — Other Ambulatory Visit: Payer: Self-pay

## 2020-05-05 ENCOUNTER — Ambulatory Visit (HOSPITAL_COMMUNITY)
Admission: RE | Admit: 2020-05-05 | Discharge: 2020-05-05 | Disposition: A | Discharge status: Home / Self Care | Payer: BC Managed Care – PPO | Source: Ambulatory Visit | Attending: Family Medicine | Admitting: Family Medicine

## 2020-05-05 ENCOUNTER — Encounter (HOSPITAL_COMMUNITY): Payer: Self-pay

## 2020-05-05 VITALS — BP 131/84 | HR 75 | Temp 98.7°F | Resp 16

## 2020-05-05 DIAGNOSIS — Z20822 Contact with and (suspected) exposure to covid-19: Secondary | ICD-10-CM | POA: Insufficient documentation

## 2020-05-05 NOTE — Discharge Instructions (Addendum)
You have been tested for COVID-19 today. °If your test returns positive, you will receive a phone call from [facility] regarding your results. °Negative test results are not called. °Both positive and negative results area always visible on MyChart. °If you do not have a MyChart account, sign up instructions are provided in your discharge papers. °Please do not hesitate to contact us should you have questions or concerns. ° °

## 2020-05-05 NOTE — ED Triage Notes (Signed)
Family member in house tested positive for COVID yesterday.  Pt denies any symptoms.  Here for COVID test.

## 2020-05-06 ENCOUNTER — Telehealth: Payer: Self-pay | Admitting: *Deleted

## 2020-05-06 LAB — SARS CORONAVIRUS 2 (TAT 6-24 HRS): SARS Coronavirus 2: NEGATIVE

## 2020-05-06 NOTE — Telephone Encounter (Signed)
Pt calling for covid results for her and her children. All results still active, not resulted. Pt verbalizes understanding.

## 2020-05-08 NOTE — ED Provider Notes (Signed)
[facility]   [id number] 05/05/20 Arrival Time: 7829  ASSESSMENT & PLAN:  1. Exposure to COVID-19 virus      COVID-19 testing sent. See letter/work note on file for self-isolation guidelines.    Follow-up Information    [facility] at [facility].   Specialty: Urgent Care Why: As needed. Contact information: [address] [place] [address] [phone]              Reviewed expectations re: course of current medical issues. Questions answered. Outlined signs and symptoms indicating need for more acute intervention. Understanding verbalized. After Visit Summary given.   SUBJECTIVE: History from: patient. Jennifer Leach is a 39 y.o. female who requests COVID-19 testing. Known COVID-19 contact: family member. Recent travel: none. Denies: runny nose, congestion, fever, cough, sore throat, difficulty breathing and headache. Normal PO intake without n/v/d.    OBJECTIVE:  Vitals:   05/05/20 1953  BP: (!) 131/84  Pulse: 75  Resp: 16  Temp: 98.7 F (37.1 C)  TempSrc: Oral  SpO2: 98%    General appearance: alert; no distress Eyes: PERRLA; EOMI; conjunctiva normal HENT: [place]; AT; nasal mucosa normal; oral mucosa normal Neck: supple  Lungs: speaks full sentences without difficulty; unlabored Extremities: no edema Skin: warm and dry Neurologic: normal gait Psychological: alert and cooperative; normal mood and affect  Labs:  Labs Reviewed  SARS CORONAVIRUS 2 (TAT 6-24 HRS)    Imaging: No results found.  Allergies  Allergen Reactions  . Septra Ds [Sulfamethoxazole-Trimethoprim] Anaphylaxis, Swelling, Rash and Other (See Comments)    Reaction:  Facial swelling     Past Medical History:  Diagnosis Date  . Bacterial vaginitis 03/04/2017  . Gonorrhea   . Labial swelling 03/04/2017  . MRSA (methicillin resistant staph aureus) culture positive 08/06/2014   Social History   Socioeconomic History  . Marital status:  Single    Spouse name: Not on file  . Number of children: Not on file  . Years of education: Not on file  . Highest education level: Not on file  Occupational History  . Not on file  Tobacco Use  . Smoking status: Former Smoker    Packs/day: 0.25    Years: 9.00    Pack years: 2.25    Types: Cigarettes    Quit date: 06/09/2012    Years since quitting: 7.9  . Smokeless tobacco: Never Used  . Tobacco comment: pt reports is is smoking   Vaping Use  . Vaping Use: Never used  Substance and Sexual Activity  . Alcohol use: No  . Drug use: No  . Sexual activity: Yes    Partners: Male    Birth control/protection: Injection  Other Topics Concern  . Not on file  Social History Narrative  . Not on file   Social Determinants of Health   Financial Resource Strain:   . Difficulty of Paying Living Expenses:   Food Insecurity:   . Worried About Programme researcher, broadcasting/film/video in the Last Year:   . Barista in the Last Year:   Transportation Needs:   . Freight forwarder (Medical):   Marland Kitchen Lack of Transportation (Non-Medical):   Physical Activity:   . Days of Exercise per Week:   . Minutes of Exercise per Session:   Stress:   . Feeling of Stress :   Social Connections:   . Frequency of Communication with Friends and Family:   . Frequency of Social Gatherings with Friends and  Family:   . Attends Religious Services:   . Active Member of Clubs or Organizations:   . Attends Banker Meetings:   Marland Kitchen Marital Status:   Intimate Partner Violence:   . Fear of Current or Ex-Partner:   . Emotionally Abused:   Marland Kitchen Physically Abused:   . Sexually Abused:    Family History  Problem Relation Age of Onset  . Hypertension Mother    Past Surgical History:  Procedure Laterality Date  . NO PAST SURGERIES    . TUBAL LIGATION N/A 02/19/2017   Procedure: POST PARTUM TUBAL LIGATION;  Surgeon: [name], MD;  Location: [facility] SU[facility];  Service: Gynecology;  Laterality: N/A;  .  WISDOM TOOTH EXTRACTION       Mardella Layman, MD 05/08/20 [phone]

## 2020-05-12 ENCOUNTER — Ambulatory Visit: Payer: PRIVATE HEALTH INSURANCE

## 2020-05-26 ENCOUNTER — Telehealth: Payer: Self-pay

## 2020-05-26 NOTE — Telephone Encounter (Signed)
Returned call, pt states that she does not want to continue depo due to irregular bleeding and she previous tried [facility] pills that did not help, pt requesting refill on Lysteda, advised that refill should be at pharmacy.

## 2020-06-18 ENCOUNTER — Other Ambulatory Visit: Payer: Self-pay

## 2020-06-18 ENCOUNTER — Other Ambulatory Visit (HOSPITAL_COMMUNITY)
Admission: RE | Admit: 2020-06-18 | Discharge: 2020-06-18 | Disposition: A | Discharge status: Home / Self Care | Payer: Medicaid Other | Source: Ambulatory Visit | Attending: Obstetrics and Gynecology | Admitting: Obstetrics and Gynecology

## 2020-06-18 ENCOUNTER — Ambulatory Visit (INDEPENDENT_AMBULATORY_CARE_PROVIDER_SITE_OTHER): Payer: Medicaid Other

## 2020-06-18 VITALS — Wt 166.0 lb

## 2020-06-18 DIAGNOSIS — N898 Other specified noninflammatory disorders of vagina: Secondary | ICD-10-CM | POA: Diagnosis present

## 2020-06-18 NOTE — Progress Notes (Signed)
Patient was assessed and managed by nursing staff during this encounter. I have reviewed the chart and agree with the documentation and plan. I have also made any necessary editorial changes. ° °Lucca Ballo A Camry Theiss, MD °06/18/2020 6:27 PM   °

## 2020-06-18 NOTE — Progress Notes (Signed)
SUBJECTIVE:  39 y.o. female complains of white vaginal discharge, itching, irritation for 4 day(s). Denies abnormal vaginal bleeding, odor or significant pelvic pain or fever. No UTI symptoms. Denies history of known exposure to STD.  No LMP recorded.  OBJECTIVE:  She appears well, afebrile. Urine dipstick: not done.  ASSESSMENT:  Vaginal Discharge  Vaginal Itching    PLAN:  Self swab GC, chlamydia, trichomonas, BVAG, CVAG probe sent to lab. Treatment: To be determined once lab results are received ROV prn if symptoms persist or worsen.

## 2020-06-21 LAB — CERVICOVAGINAL ANCILLARY ONLY
Bacterial Vaginitis (gardnerella): POSITIVE — AB
Candida Glabrata: NEGATIVE
Candida Vaginitis: POSITIVE — AB
Chlamydia: NEGATIVE
Comment: NEGATIVE
Comment: NEGATIVE
Comment: NEGATIVE
Comment: NEGATIVE
Comment: NEGATIVE
Comment: NORMAL
Neisseria Gonorrhea: NEGATIVE
Trichomonas: NEGATIVE

## 2020-06-22 ENCOUNTER — Other Ambulatory Visit: Payer: Self-pay

## 2020-06-22 ENCOUNTER — Telehealth: Payer: Self-pay

## 2020-06-22 MED ORDER — TERCONAZOLE 0.4 % VA CREA: 1.0000 | TOPICAL_CREAM | Freq: Every day | VAGINAL | 0 refills | Status: DC

## 2020-06-22 MED ORDER — METRONIDAZOLE 500 MG PO TABS: 500.0000 mg | ORAL_TABLET | Freq: Two times a day (BID) | ORAL | 0 refills | Status: DC

## 2020-06-22 NOTE — Telephone Encounter (Signed)
Notified patient of results and RX.

## 2020-06-22 NOTE — Telephone Encounter (Signed)
-----   Message from Warden Fillers, MD sent at 06/22/2020 11:08 AM EDT ----- Results showed bacterial vaginosis and yeast, will treat both, terazol and flagyl

## 2020-06-22 NOTE — Progress Notes (Signed)
Rx sent to Pharmacy per Dr. Donavan Foil instructions. Patient notified

## 2020-06-23 ENCOUNTER — Other Ambulatory Visit: Payer: Self-pay

## 2020-06-23 ENCOUNTER — Encounter (HOSPITAL_COMMUNITY): Payer: Self-pay

## 2020-06-23 ENCOUNTER — Ambulatory Visit (HOSPITAL_COMMUNITY)
Admission: RE | Admit: 2020-06-23 | Discharge: 2020-06-23 | Disposition: A | Discharge status: Home / Self Care | Payer: Medicaid Other | Source: Ambulatory Visit | Attending: Family Medicine | Admitting: Family Medicine

## 2020-06-23 VITALS — BP 125/79 | HR 91 | Temp 98.8°F | Resp 13

## 2020-06-23 DIAGNOSIS — R430 Anosmia: Secondary | ICD-10-CM | POA: Insufficient documentation

## 2020-06-23 DIAGNOSIS — Z20822 Contact with and (suspected) exposure to covid-19: Secondary | ICD-10-CM | POA: Diagnosis present

## 2020-06-23 DIAGNOSIS — R5383 Other fatigue: Secondary | ICD-10-CM | POA: Insufficient documentation

## 2020-06-23 MED ORDER — ONDANSETRON HCL 4 MG PO TABS: 4.0000 mg | ORAL_TABLET | Freq: Three times a day (TID) | ORAL | 0 refills | Status: DC | PRN

## 2020-06-23 NOTE — Discharge Instructions (Signed)
Push fluids to ensure adequate hydration and keep secretions thin.  Tylenol and/or ibuprofen as needed for pain or fevers.  Zofran every 8 hours as needed for nausea or vomiting.   Rest.  Self isolate until covid results are back and negative.  Will notify you by phone of any positive findings. Your negative results will be sent through your MyChart.     If symptoms worsen or do not improve in the next week to return to be seen or to follow up with your PCP.

## 2020-06-23 NOTE — ED Triage Notes (Signed)
Pt c/o covid like symptoms including body aches, loss of smell and taste, sore throat onset last Saturday. Pt states she went to the fair last weekend and was in the public with a lot of people. Pt denies cough, congestion or fever.

## 2020-06-23 NOTE — ED Provider Notes (Signed)
[facility]    CSN: [id number] Arrival date & time: 06/23/20  1754      History   Chief Complaint Chief Complaint  Patient presents with  . Appointment  . covid symptoms    HPI Jennifer Leach is a 39 y.o. female.   Jennifer Leach presents with complaints of loss of smell. Hot flashes, body aches. Nausea. Started 9/11. She had been at a fair that day. Intermittent chest pain. Loss of taste. Fatigue. If she speaks for a longer time period it makes her feel short of breath and with some chest pain . No cough or congestion. No sore throat. No fevers. Hasn't been taking any medications for symptoms. No known ill contacts. No history of covid-19 and has not received vaccination.    ROS per HPI, negative if not otherwise mentioned.      Past Medical History:  Diagnosis Date  . Bacterial vaginitis 03/04/2017  . Gonorrhea   . Labial swelling 03/04/2017  . MRSA (methicillin resistant staph aureus) culture positive 08/06/2014    Patient Active Problem List   Diagnosis Date Noted  . Breakthrough bleeding on Depo-Provera 04/14/2020  . Hidradenitis 04/14/2020  . Gonorrhea 11/20/2019  . Abnormal uterine bleeding (AUB) 11/19/2018  . H/O tubal ligation 03/15/2017  . Bacterial vaginitis 03/04/2017  . Labial swelling 03/04/2017    Past Surgical History:  Procedure Laterality Date  . NO PAST SURGERIES    . TUBAL LIGATION N/A 02/19/2017   Procedure: POST PARTUM TUBAL LIGATION;  Surgeon: [name], MD;  Location: [facility] BIRTHING SUITES;  Service: Gynecology;  Laterality: N/A;  . WISDOM TOOTH EXTRACTION      OB History    Gravida  4   Para  4   Term  4   Preterm      AB  0   Living  4     SAB      TAB  0   Ectopic      Multiple  0   Live Births  4            Home Medications    Prior to Admission medications   Medication Sig Start Date End Date Taking? Authorizing Provider  benzonatate (TESSALON) 100 MG capsule Take 1-2 capsules  (100-200 mg total) by mouth 3 (three) times daily as needed for cough. Patient not taking: Reported on 04/14/2020 04/06/20   Wallis Bamberg, PA-C  cetirizine (ZYRTEC ALLERGY) 10 MG tablet Take 1 tablet (10 mg total) by mouth daily. Patient not taking: Reported on 04/14/2020 04/06/20   Wallis Bamberg, PA-C  ethynodiol-ethinyl estradiol (ZOVIA) 1-35 MG-MCG tablet Take 1 tablet by mouth daily. DO NOT TAKE THE INACTIVE (LAST WEEK) OF PILLS. START ON NEW PACK WHEN DONE WITH 1st 3 WEEKS IN PACK 04/14/20 06/13/20  Raelyn Mora, CNM  guaiFENesin (MUCINEX) 600 MG 12 hr tablet Take by mouth 2 (two) times daily. Patient not taking: Reported on 04/14/2020    [provider]  medroxyPROGESTERone (DEPO-PROVERA) 150 MG/ML injection Inject 1 mL (150 mg total) into the muscle every 3 (three) months. 11/19/19   Calvert Cantor, CNM  metroNIDAZOLE (FLAGYL) 500 MG tablet Take 1 tablet (500 mg total) by mouth 2 (two) times daily. 06/22/20   Warden Fillers, MD  ondansetron (ZOFRAN) 4 MG tablet Take 1 tablet (4 mg total) by mouth every 8 (eight) hours as needed for nausea or vomiting. 06/23/20   Georgetta Haber, NP  promethazine-dextromethorphan (PROMETHAZINE-DM) 6.25-15 MG/5ML syrup  Take 5 mLs by mouth at bedtime as needed for cough. Patient not taking: Reported on 04/14/2020 04/06/20   Wallis Bamberg, PA-C  Pseudoeph-Doxylamine-DM-APAP (NYQUIL PO) Take by mouth. Patient not taking: Reported on 04/14/2020    [provider]  pseudoephedrine (SUDAFED) 60 MG tablet Take 1 tablet (60 mg total) by mouth every 8 (eight) hours as needed for congestion (post-nasal drainage). Patient not taking: Reported on 04/14/2020 04/06/20   Wallis Bamberg, PA-C  terconazole (TERAZOL 7) 0.4 % vaginal cream Place 1 applicator vaginally at bedtime. 06/22/20   Warden Fillers, MD  tranexamic acid (LYSTEDA) 650 MG TABS tablet Take 2 tablets (1,300 mg total) by mouth 3 (three) times daily. Take during menses for a maximum of five days Patient not  taking: Reported on 04/14/2020 03/22/20   Tereso Newcomer, MD    Family History Family History  Problem Relation Age of Onset  . Hypertension Mother     Social History Social History   Tobacco Use  . Smoking status: Former Smoker    Packs/day: 0.25    Years: 9.00    Pack years: 2.25    Types: Cigarettes    Quit date: 06/09/2012    Years since quitting: 8.0  . Smokeless tobacco: Never Used  . Tobacco comment: pt reports is is smoking   Vaping Use  . Vaping Use: Never used  Substance Use Topics  . Alcohol use: No  . Drug use: No     Allergies   Septra ds [sulfamethoxazole-trimethoprim]   Review of Systems Review of Systems   Physical Exam Triage Vital Signs ED Triage Vitals  Enc Vitals Group     BP 06/23/20 1813 125/79     Pulse Rate 06/23/20 1813 91     Resp 06/23/20 1813 13     Temp 06/23/20 1813 98.8 F (37.1 C)     Temp Source 06/23/20 1813 Oral     SpO2 06/23/20 1813 100 %     Weight --      Height --      Head Circumference --      Peak Flow --      Pain Score 06/23/20 1809 0     Pain Loc --      Pain Edu? --      Excl. in GC? --    No data found.  Updated Vital Signs BP 125/79 (BP Location: Left Arm)   Pulse 91   Temp 98.8 F (37.1 C) (Oral)   Resp 13   LMP 05/27/2020   SpO2 100%   Visual Acuity Right Eye Distance:   Left Eye Distance:   Bilateral Distance:    Right Eye Near:   Left Eye Near:    Bilateral Near:     Physical Exam Constitutional:      General: She is not in acute distress.    Appearance: She is well-developed.  Cardiovascular:     Rate and Rhythm: Normal rate.  Pulmonary:     Effort: Pulmonary effort is normal.  Skin:    General: Skin is warm and dry.  Neurological:     Mental Status: She is alert and oriented to person, place, and time.      UC Treatments / Results  Labs (all labs ordered are listed, but only abnormal results are displayed) Labs Reviewed  SARS CORONAVIRUS 2 (TAT 6-24 HRS)     EKG   Radiology No results found.  Procedures Procedures (including critical care time)  Medications Ordered  in UC Medications - No data to display  Initial Impression / Assessment and Plan / UC Course  I have reviewed the triage vital signs and the nursing notes.  Pertinent labs & imaging results that were available during my care of the patient were reviewed by me and considered in my medical decision making (see chart for details).     Non toxic. Benign physical exam.  No work of breathing. History and physical consistent with viral illness.  Covid testing pending and isolation instructions provided.  Supportive cares recommended. Return precautions provided. Patient verbalized understanding and agreeable to plan.   Final Clinical Impressions(s) / UC Diagnoses   Final diagnoses:  Loss of smell  Fatigue, unspecified type  Encounter for laboratory testing for COVID-19 virus     Discharge Instructions     Push fluids to ensure adequate hydration and keep secretions thin.  Tylenol and/or ibuprofen as needed for pain or fevers.  Zofran every 8 hours as needed for nausea or vomiting.   Rest.  Self isolate until covid results are back and negative.  Will notify you by phone of any positive findings. Your negative results will be sent through your MyChart.     If symptoms worsen or do not improve in the next week to return to be seen or to follow up with your PCP.      ED Prescriptions    Medication Sig Dispense Auth. Provider   ondansetron (ZOFRAN) 4 MG tablet Take 1 tablet (4 mg total) by mouth every 8 (eight) hours as needed for nausea or vomiting. 10 tablet Georgetta Haber, NP     PDMP not reviewed this encounter.   Georgetta Haber, NP 06/23/20 1849

## 2020-06-24 LAB — SARS CORONAVIRUS 2 (TAT 6-24 HRS): SARS Coronavirus 2: POSITIVE — AB

## 2020-06-25 ENCOUNTER — Telehealth: Payer: Self-pay | Admitting: Nurse Practitioner

## 2020-06-25 ENCOUNTER — Encounter: Payer: Self-pay | Admitting: Nurse Practitioner

## 2020-06-25 DIAGNOSIS — U071 COVID-19: Secondary | ICD-10-CM

## 2020-06-25 NOTE — Telephone Encounter (Signed)
Called to Discuss with patient about Covid symptoms and the use of regeneron, a monoclonal antibody infusion for those with mild to moderate Covid symptoms and at a high risk of hospitalization.     Pt is qualified for this infusion at the [facility] infusion center due to co-morbid conditions and/or a member of an at-risk group.     Unable to reach pt. Sent mychart message and text.   Consuello Masse, DNP, AGNP-C [phone] (Infusion Center Hotline)

## 2020-07-26 ENCOUNTER — Ambulatory Visit (INDEPENDENT_AMBULATORY_CARE_PROVIDER_SITE_OTHER): Payer: Medicaid Other

## 2020-07-26 ENCOUNTER — Other Ambulatory Visit: Payer: Self-pay

## 2020-07-26 DIAGNOSIS — N898 Other specified noninflammatory disorders of vagina: Secondary | ICD-10-CM

## 2020-07-26 NOTE — Progress Notes (Signed)
Patient was assessed and managed by nursing staff during this encounter. I have reviewed the chart and agree with the documentation and plan. I have also made any necessary editorial changes.  Catalina Antigua, MD 07/26/2020 1:19 PM

## 2020-07-26 NOTE — Progress Notes (Signed)
SUBJECTIVE:  39 y.o. female complains of malodorous vaginal discharge for 1 week(s). Denies abnormal vaginal bleeding or significant pelvic pain or fever. No UTI symptoms. Pt requests all STD testing.    OBJECTIVE:  She appears well, afebrile. Urine dipstick: not done.  ASSESSMENT:  Vaginal Discharge  Vaginal Odor   PLAN:  If results are positive, pt aware we will send prescription once the provider reviews the results.   GC, chlamydia, trichomonas, BVAG, CVAG probe sent to lab. Treatment: To be determined once lab results are received ROV prn if symptoms persist or worsen.

## 2020-07-27 LAB — CERVICOVAGINAL ANCILLARY ONLY
Bacterial Vaginitis (gardnerella): NEGATIVE
Candida Glabrata: NEGATIVE
Candida Vaginitis: NEGATIVE
Chlamydia: NEGATIVE
Comment: NEGATIVE
Comment: NEGATIVE
Comment: NEGATIVE
Comment: NEGATIVE
Comment: NEGATIVE
Comment: NORMAL
Neisseria Gonorrhea: NEGATIVE
Trichomonas: NEGATIVE

## 2020-07-27 LAB — HEPATITIS B SURFACE ANTIGEN: Hepatitis B Surface Ag: NEGATIVE

## 2020-07-27 LAB — HEPATITIS C ANTIBODY: Hep C Virus Ab: <0.1 s/co ratio (ref 0.0–0.9)

## 2020-07-27 LAB — HIV ANTIBODY (ROUTINE TESTING W REFLEX): HIV Screen 4th Generation wRfx: NONREACTIVE

## 2020-07-27 LAB — RPR: RPR Ser Ql: NONREACTIVE

## 2020-08-09 ENCOUNTER — Ambulatory Visit: Payer: Medicaid Other | Admitting: Obstetrics

## 2020-08-24 ENCOUNTER — Other Ambulatory Visit: Payer: Self-pay

## 2020-08-24 ENCOUNTER — Ambulatory Visit (HOSPITAL_COMMUNITY)
Admission: EM | Admit: 2020-08-24 | Discharge: 2020-08-24 | Discharge status: Against Medical Advice | Payer: Medicaid Other | Attending: Family Medicine | Admitting: Family Medicine

## 2020-08-24 NOTE — ED Notes (Signed)
Patient did not want to wait any longer .

## 2020-09-08 ENCOUNTER — Ambulatory Visit: Payer: Medicaid Other | Admitting: Obstetrics

## 2020-09-09 ENCOUNTER — Other Ambulatory Visit (HOSPITAL_COMMUNITY)
Admission: RE | Admit: 2020-09-09 | Discharge: 2020-09-09 | Disposition: A | Discharge status: Home / Self Care | Payer: Medicaid Other | Source: Ambulatory Visit | Attending: Obstetrics | Admitting: Obstetrics

## 2020-09-09 ENCOUNTER — Ambulatory Visit (INDEPENDENT_AMBULATORY_CARE_PROVIDER_SITE_OTHER): Payer: Medicaid Other | Admitting: Obstetrics

## 2020-09-09 ENCOUNTER — Encounter: Payer: Self-pay | Admitting: Obstetrics

## 2020-09-09 ENCOUNTER — Other Ambulatory Visit: Payer: Self-pay

## 2020-09-09 VITALS — BP 121/80 | HR 65 | Ht 66.0 in | Wt 168.0 lb

## 2020-09-09 DIAGNOSIS — B3731 Acute candidiasis of vulva and vagina: Secondary | ICD-10-CM

## 2020-09-09 DIAGNOSIS — N898 Other specified noninflammatory disorders of vagina: Secondary | ICD-10-CM

## 2020-09-09 DIAGNOSIS — N971 Female infertility of tubal origin: Secondary | ICD-10-CM | POA: Diagnosis not present

## 2020-09-09 DIAGNOSIS — Z3169 Encounter for other general counseling and advice on procreation: Secondary | ICD-10-CM

## 2020-09-09 DIAGNOSIS — N760 Acute vaginitis: Secondary | ICD-10-CM

## 2020-09-09 DIAGNOSIS — B373 Candidiasis of vulva and vagina: Secondary | ICD-10-CM | POA: Diagnosis not present

## 2020-09-09 DIAGNOSIS — B9689 Other specified bacterial agents as the cause of diseases classified elsewhere: Secondary | ICD-10-CM

## 2020-09-09 MED ORDER — FLUCONAZOLE 150 MG PO TABS: 150.0000 mg | ORAL_TABLET | Freq: Once | ORAL | 2 refills | Status: DC

## 2020-09-09 MED ORDER — METRONIDAZOLE 0.75 % VA GEL: 1.0000 | Freq: Two times a day (BID) | VAGINAL | 2 refills | Status: DC

## 2020-09-09 MED ORDER — PRENATE MINI 29-0.6-0.4-350 MG PO CAPS: 1.0000 | ORAL_CAPSULE | Freq: Every day | ORAL | 3 refills | Status: DC

## 2020-09-09 NOTE — Progress Notes (Signed)
Patient ID: Jennifer Leach, female   DOB: 1981/02/09, 39 y.o.   MRN: [id number]  Chief Complaint  Patient presents with  . Vaginal Discharge    HPI Jennifer Leach is a 39 y.o. female.  Complains of malodorous vaginal discharge with itching. HPI  Past Medical History:  Diagnosis Date  . Bacterial vaginitis 03/04/2017  . Gonorrhea   . Labial swelling 03/04/2017  . MRSA (methicillin resistant staph aureus) culture positive 08/06/2014    Past Surgical History:  Procedure Laterality Date  . NO PAST SURGERIES    . TUBAL LIGATION N/A 02/19/2017   Procedure: POST PARTUM TUBAL LIGATION;  Surgeon: [name], MD;  Location: [facility] BIRTHING SUITES;  Service: Gynecology;  Laterality: N/A;  . WISDOM TOOTH EXTRACTION      Family History  Problem Relation Age of Onset  . Hypertension Mother     Social History Social History   Tobacco Use  . Smoking status: Former Smoker    Packs/day: 0.25    Years: 9.00    Pack years: 2.25    Types: Cigarettes    Quit date: 06/09/2012    Years since quitting: 8.2  . Smokeless tobacco: Never Used  . Tobacco comment: pt reports is is smoking   Vaping Use  . Vaping Use: Never used  Substance Use Topics  . Alcohol use: No  . Drug use: No    Allergies  Allergen Reactions  . Septra Ds [Sulfamethoxazole-Trimethoprim] Anaphylaxis, Swelling, Rash and Other (See Comments)    Reaction:  Facial swelling     Current Outpatient Medications  Medication Sig Dispense Refill  . benzonatate (TESSALON) 100 MG capsule Take 1-2 capsules (100-200 mg total) by mouth 3 (three) times daily as needed for cough. (Patient not taking: Reported on 04/14/2020) 60 capsule 0  . cetirizine (ZYRTEC ALLERGY) 10 MG tablet Take 1 tablet (10 mg total) by mouth daily. (Patient not taking: Reported on 04/14/2020) 30 tablet 0  . ethynodiol-ethinyl estradiol (ZOVIA) 1-35 MG-MCG tablet Take 1 tablet by mouth daily. DO NOT TAKE THE INACTIVE (LAST WEEK) OF PILLS. START ON NEW PACK WHEN  DONE WITH 1st 3 WEEKS IN PACK 30 tablet 1  . guaiFENesin (MUCINEX) 600 MG 12 hr tablet Take by mouth 2 (two) times daily. (Patient not taking: Reported on 04/14/2020)    . medroxyPROGESTERone (DEPO-PROVERA) 150 MG/ML injection Inject 1 mL (150 mg total) into the muscle every 3 (three) months. (Patient not taking: Reported on 07/26/2020) 1 mL 0  . metroNIDAZOLE (FLAGYL) 500 MG tablet Take 1 tablet (500 mg total) by mouth 2 (two) times daily. (Patient not taking: Reported on 07/26/2020) 14 tablet 0  . ondansetron (ZOFRAN) 4 MG tablet Take 1 tablet (4 mg total) by mouth every 8 (eight) hours as needed for nausea or vomiting. (Patient not taking: Reported on 07/26/2020) 10 tablet 0  . Prenat w/o A-FeCbn-Meth-FA-DHA (PRENATE MINI) 29-0.6-0.4-350 MG CAPS Take 1 capsule by mouth daily before breakfast. 90 capsule 3  . promethazine-dextromethorphan (PROMETHAZINE-DM) 6.25-15 MG/5ML syrup Take 5 mLs by mouth at bedtime as needed for cough. (Patient not taking: Reported on 04/14/2020) 100 mL 0  . Pseudoeph-Doxylamine-DM-APAP (NYQUIL PO) Take by mouth. (Patient not taking: Reported on 04/14/2020)    . pseudoephedrine (SUDAFED) 60 MG tablet Take 1 tablet (60 mg total) by mouth every 8 (eight) hours as needed for congestion (post-nasal drainage). (Patient not taking: Reported on 04/14/2020) 30 tablet 0  . terconazole (TERAZOL 7) 0.4 % vaginal cream Place 1 applicator vaginally  at bedtime. (Patient not taking: Reported on 07/26/2020) 45 g 0  . tranexamic acid (LYSTEDA) 650 MG TABS tablet Take 2 tablets (1,300 mg total) by mouth 3 (three) times daily. Take during menses for a maximum of five days (Patient not taking: Reported on 04/14/2020) 30 tablet 2   No current facility-administered medications for this visit.    Review of Systems Review of Systems Constitutional: negative for fatigue and weight loss Respiratory: negative for cough and wheezing Cardiovascular: negative for chest pain, fatigue and  palpitations Gastrointestinal: negative for abdominal pain and change in bowel habits Genitourinary: positive for malodorous vaginal discharge with itching Integument/breast: negative for nipple discharge Musculoskeletal:negative for myalgias Neurological: negative for gait problems and tremors Behavioral/Psych: negative for abusive relationship, depression Endocrine: negative for temperature intolerance      Blood pressure 121/80, pulse 65, height 5\' 6"  (1.676 m), weight 168 lb (76.2 kg), last menstrual period 08/10/2020.  Physical Exam Physical Exam           General: Alert and no distress Abdomen:  normal findings: no organomegaly, soft, non-tender and no hernia  Pelvis:  External genitalia: normal general appearance Urinary system: urethral meatus normal and bladder without fullness, nontender Vaginal: normal without tenderness, induration or masses Cervix: normal appearance Adnexa: normal bimanual exam Uterus: anteverted and non-tender, normal size    50% of 15 min visit spent on counseling and coordination of care.   Data Reviewed Wet Prep and Cultures  Assessment     1. Vaginal discharge Rx: - Cervicovaginal ancillary only( [place])  2. Bacterial vaginosis Rx: - metroNIDAZOLE (METROGEL VAGINAL) 0.75 % vaginal gel; Place 1 Applicatorful vaginally 2 (two) times daily.  Dispense: 70 g; Refill: 2  3. Candida vaginitis Rx: - fluconazole (DIFLUCAN) 150 MG tablet; Take 1 tablet (150 mg total) by mouth once for 1 dose.  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 Encounter for preconception consultation Rx: - Prenat w/o A-FeCbn-Meth-FA-DHA (PRENATE MINI) 29-0.6-0.4-350 MG CAPS; Take 1 capsule by mouth daily before breakfast.  Dispense: 90 capsule; Refill: 3    Plan   Follow up prn  Orders Placed This Encounter  Procedures  . Ambulatory referral to  Endocrinology    Referral Priority:   Routine    Referral Type:   Consultation    Referral Reason:   Specialty Services Required    Number of Visits Requested:   1   Meds ordered this encounter  Medications  . Prenat w/o A-FeCbn-Meth-FA-DHA (PRENATE MINI) 29-0.6-0.4-350 MG CAPS    Sig: Take 1 capsule by mouth daily before breakfast.    Dispense:  90 capsule    Refill:  3     06-18-1978, MD 09/09/2020 9:45 AM

## 2020-09-09 NOTE — Progress Notes (Signed)
GYN presents for vaginal discharge, odor,  Itching, burning, pelvic pain 6-7/10 x 5 days.

## 2020-09-13 ENCOUNTER — Other Ambulatory Visit: Payer: Self-pay | Admitting: Obstetrics

## 2020-09-13 LAB — CERVICOVAGINAL ANCILLARY ONLY
Bacterial Vaginitis (gardnerella): POSITIVE — AB
Candida Glabrata: NEGATIVE
Candida Vaginitis: NEGATIVE
Chlamydia: NEGATIVE
Comment: NEGATIVE
Comment: NEGATIVE
Comment: NEGATIVE
Comment: NEGATIVE
Comment: NEGATIVE
Comment: NORMAL
Neisseria Gonorrhea: NEGATIVE
Trichomonas: NEGATIVE

## 2020-09-27 ENCOUNTER — Other Ambulatory Visit: Payer: Self-pay

## 2020-09-27 DIAGNOSIS — Z3169 Encounter for other general counseling and advice on procreation: Secondary | ICD-10-CM

## 2020-09-27 MED ORDER — PRENATE PIXIE 10-0.6-0.4-200 MG PO CAPS: 1.0000 | ORAL_CAPSULE | Freq: Every day | ORAL | 3 refills | Status: DC

## 2020-10-13 ENCOUNTER — Ambulatory Visit: Payer: BC Managed Care – PPO | Admitting: Physician Assistant

## 2020-10-19 ENCOUNTER — Encounter: Payer: Self-pay | Admitting: Dermatology

## 2020-10-19 ENCOUNTER — Ambulatory Visit (INDEPENDENT_AMBULATORY_CARE_PROVIDER_SITE_OTHER): Payer: Medicaid Other | Admitting: Dermatology

## 2020-10-19 ENCOUNTER — Other Ambulatory Visit: Payer: Self-pay

## 2020-10-19 DIAGNOSIS — L732 Hidradenitis suppurativa: Secondary | ICD-10-CM | POA: Diagnosis not present

## 2020-10-19 MED ORDER — RIFAMPIN 300 MG PO CAPS: 300.0000 mg | ORAL_CAPSULE | Freq: Two times a day (BID) | ORAL | Status: DC

## 2020-10-19 MED ORDER — CLINDAMYCIN HCL 150 MG PO CAPS: 300.0000 mg | ORAL_CAPSULE | Freq: Two times a day (BID) | ORAL | Status: DC

## 2020-10-19 NOTE — Patient Instructions (Signed)
Dorthey Sawyer, M.D., F.A.Tax adviser [facility]): [address] [place], [place] [address] [phone] (Phone) [phone] (Fax

## 2020-10-20 ENCOUNTER — Encounter: Payer: Self-pay | Admitting: Dermatology

## 2020-10-21 NOTE — Progress Notes (Signed)
   New Patient   Subjective  Jennifer Leach is a 40 y.o. female who presents for the following: Skin Problem (Boils underarms & groin x 1 year- seem to be more frequent tx- no meds).  Hidradenitis Location: Historically under arm and leg crease but recently mainly involving left axilla Duration:  Quality:  Associated Signs/Symptoms: Modifying Factors: Nothing currently Severity:  Timing: Context:    The following portions of the chart were reviewed this encounter and updated as appropriate:  Tobacco  Allergies  Meds  Problems  Med Hx  Surg Hx  Fam Hx      Objective  Well appearing patient in no apparent distress; mood and affect are within normal limits. Objective  Left Axilla, Pubic, Right Axilla: Moderately severe, clinically typical hidradenitis with 2 draining cysts, multiple sinus tracts, moderate fibrosis most severely affecting left axilla.  Bacteria culture under arm     A focused examination was performed including Axillae, under breast.. Relevant physical exam findings are noted in the Assessment and Plan.   Assessment & Plan  Hidradenitis suppurativa (3) Pubic; Left Axilla; Right Axilla  I believe Jennifer Leach is a candidate to consult with a plastic surgeon about local excision of the affected tissue rather than immediately going to combination antibiotics were considering Humira.  We will try and arrange this consultation.  Other Related Procedures Anaerobic and Aerobic Culture  Ordered Medications: rifampin (RIFADIN) capsule 300 mg clindamycin (CLEOCIN) capsule 300 mg

## 2020-10-22 ENCOUNTER — Other Ambulatory Visit: Payer: Self-pay

## 2020-10-22 ENCOUNTER — Telehealth: Payer: Self-pay | Admitting: Dermatology

## 2020-10-22 ENCOUNTER — Telehealth: Payer: Self-pay

## 2020-10-22 DIAGNOSIS — L732 Hidradenitis suppurativa: Secondary | ICD-10-CM

## 2020-10-22 MED ORDER — RIFAMPIN 300 MG PO CAPS: 300.0000 mg | ORAL_CAPSULE | Freq: Two times a day (BID) | ORAL | 0 refills | Status: DC

## 2020-10-22 MED ORDER — CLINDAMYCIN HCL 300 MG PO CAPS: 300.0000 mg | ORAL_CAPSULE | Freq: Two times a day (BID) | ORAL | 0 refills | Status: DC

## 2020-10-22 NOTE — Telephone Encounter (Signed)
Phone call from patient stating that her Pharmacy didn't have her medication.  Medication resent to her Pharmacy.  Patient aware.

## 2020-10-22 NOTE — Telephone Encounter (Signed)
-----   Message from Janalyn Harder, MD sent at 10/22/2020  6:21 AM EST ----- I believe we are trying to refer [name] to Dr. Westley Foots.  If he does not accept her insurance, please check whether the [facility] health plastic surgeon is available.

## 2020-10-22 NOTE — Telephone Encounter (Signed)
Phone call to patient to give her the number to [facility] # [phone].  Patient aware.

## 2020-10-22 NOTE — Telephone Encounter (Signed)
Says pharmacy never got Rx

## 2020-10-25 LAB — ANAEROBIC AND AEROBIC CULTURE
MICRO NUMBER:: 11404757
MICRO NUMBER:: 11404758
SPECIMEN QUALITY:: ADEQUATE
SPECIMEN QUALITY:: ADEQUATE

## 2020-10-26 ENCOUNTER — Telehealth: Payer: Self-pay | Admitting: Dermatology

## 2020-10-26 NOTE — Telephone Encounter (Signed)
Patient calling about results. She states that she can see results and does not understand them. She would like for nurse to call her. Previous TE about results was already signed so I created a new one.

## 2020-10-26 NOTE — Telephone Encounter (Signed)
Called to let patient know bacterial results negative.

## 2020-11-02 ENCOUNTER — Telehealth: Payer: Self-pay | Admitting: Dermatology

## 2020-11-02 NOTE — Telephone Encounter (Signed)
PO Medication that was given for boils under arm is making it worse. The medication is making her vomit and boils under arms are also getting worse.

## 2020-11-08 ENCOUNTER — Telehealth: Payer: Self-pay | Admitting: Dermatology

## 2020-11-08 NOTE — Telephone Encounter (Signed)
Med (clindamycin) is making her nauseous--she throws up. Pharmacy: [facility] Also, she said was given referral for a surgery and tried setting it up herself but was told we have to make appt. ("remove boils under arm"--she doesn't remember name of practice)

## 2020-11-08 NOTE — Telephone Encounter (Signed)
Plastics don't treat HS she will need a general surgeon message sent to Dr Jorja Loa. Patient needs to call medicaid to see what surgeon will do the surgery. Patient stated she is only taking clindamycin and its making her sick.I reminded the patient she was to take rifampin and clindamycin together. Call us back with a update

## 2020-11-11 ENCOUNTER — Telehealth: Payer: Self-pay | Admitting: Dermatology

## 2020-11-11 NOTE — Telephone Encounter (Signed)
One med (Rilampin 300mg ) is on hold. She got the other one (didn't have it's name handy). She's supposed to take them together and wants to know what to do.

## 2020-11-12 NOTE — Telephone Encounter (Signed)
Phone call to patient to see which other Pharmacy she would like the Rifampin prescription sent to?  Patient doesn't have a voicemail.

## 2020-11-15 NOTE — Telephone Encounter (Signed)
My chart message sent medication is ready at pharmacy.

## 2020-12-21 ENCOUNTER — Other Ambulatory Visit (HOSPITAL_COMMUNITY)
Admission: RE | Admit: 2020-12-21 | Discharge: 2020-12-21 | Disposition: A | Discharge status: Home / Self Care | Payer: Medicaid Other | Source: Ambulatory Visit | Attending: Obstetrics | Admitting: Obstetrics

## 2020-12-21 ENCOUNTER — Ambulatory Visit (INDEPENDENT_AMBULATORY_CARE_PROVIDER_SITE_OTHER): Payer: Medicaid Other | Admitting: Obstetrics

## 2020-12-21 ENCOUNTER — Other Ambulatory Visit: Payer: Self-pay

## 2020-12-21 VITALS — BP 124/85 | HR 80 | Wt 173.0 lb

## 2020-12-21 DIAGNOSIS — N898 Other specified noninflammatory disorders of vagina: Secondary | ICD-10-CM | POA: Insufficient documentation

## 2020-12-21 NOTE — Progress Notes (Addendum)
SUBJECTIVE:  40 y.o. female complains of white vaginal discharge, odor, itching for 5 day(s). Denies abnormal vaginal bleeding or significant pelvic pain or fever. No UTI symptoms. Denies history of known exposure to STD.  No LMP recorded.  OBJECTIVE:  She appears well, afebrile. Urine dipstick: not done.  ASSESSMENT:  Vaginal Discharge  Vaginal Odor   PLAN:  GC, chlamydia, trichomonas, BVAG, CVAG probe sent to lab. Treatment: To be determined once lab results are received ROV prn if symptoms persist or worsen.  Patient prefers vaginal inserts as treatment, she cannot take the pills.   Patient was assessed and managed by nursing staff during this encounter. I have reviewed the chart and agree with the documentation and plan. I have also made any necessary editorial changes.  Coral Ceo, MD 12/21/2020 5:00 PM

## 2020-12-22 LAB — CERVICOVAGINAL ANCILLARY ONLY
Bacterial Vaginitis (gardnerella): POSITIVE — AB
Candida Glabrata: NEGATIVE
Candida Vaginitis: NEGATIVE
Chlamydia: POSITIVE — AB
Comment: NEGATIVE
Comment: NEGATIVE
Comment: NEGATIVE
Comment: NEGATIVE
Comment: NEGATIVE
Comment: NORMAL
Neisseria Gonorrhea: NEGATIVE
Trichomonas: POSITIVE — AB

## 2020-12-23 ENCOUNTER — Other Ambulatory Visit: Payer: Self-pay | Admitting: Obstetrics

## 2020-12-23 ENCOUNTER — Other Ambulatory Visit: Payer: Self-pay

## 2020-12-23 DIAGNOSIS — A599 Trichomoniasis, unspecified: Secondary | ICD-10-CM

## 2020-12-23 DIAGNOSIS — N760 Acute vaginitis: Secondary | ICD-10-CM

## 2020-12-23 DIAGNOSIS — B9689 Other specified bacterial agents as the cause of diseases classified elsewhere: Secondary | ICD-10-CM

## 2020-12-23 DIAGNOSIS — A749 Chlamydial infection, unspecified: Secondary | ICD-10-CM

## 2020-12-23 MED ORDER — TINIDAZOLE 500 MG PO TABS: 2.0000 g | ORAL_TABLET | Freq: Once | ORAL | 0 refills | Status: AC

## 2020-12-23 MED ORDER — DOXYCYCLINE HYCLATE 100 MG PO CAPS: 100.0000 mg | ORAL_CAPSULE | Freq: Two times a day (BID) | ORAL | 0 refills | Status: DC

## 2020-12-23 NOTE — Telephone Encounter (Signed)
Insurance will not cover Tinidazole 500 mg. Patient can not afford to pay out of pocket. Per Dr.Harper will go ahead and change to Flagyl 500 mg BID for 7 days. Patient has been informed.

## 2020-12-24 ENCOUNTER — Other Ambulatory Visit: Payer: Self-pay | Admitting: Obstetrics and Gynecology

## 2020-12-24 MED ORDER — METRONIDAZOLE 500 MG PO TABS: 500.0000 mg | ORAL_TABLET | Freq: Two times a day (BID) | ORAL | 0 refills | Status: DC

## 2020-12-24 NOTE — Progress Notes (Signed)
Flagyl sent to pharmacy for trich/BV.

## 2021-01-09 ENCOUNTER — Other Ambulatory Visit: Payer: Self-pay | Admitting: Obstetrics

## 2021-01-09 DIAGNOSIS — B3731 Acute candidiasis of vulva and vagina: Secondary | ICD-10-CM

## 2021-01-09 DIAGNOSIS — B373 Candidiasis of vulva and vagina: Secondary | ICD-10-CM

## 2021-01-09 DIAGNOSIS — B9689 Other specified bacterial agents as the cause of diseases classified elsewhere: Secondary | ICD-10-CM

## 2021-02-01 ENCOUNTER — Other Ambulatory Visit: Payer: Self-pay

## 2021-02-01 ENCOUNTER — Other Ambulatory Visit (HOSPITAL_COMMUNITY)
Admission: RE | Admit: 2021-02-01 | Discharge: 2021-02-01 | Disposition: A | Discharge status: Home / Self Care | Payer: Medicaid Other | Source: Ambulatory Visit | Attending: Obstetrics and Gynecology | Admitting: Obstetrics and Gynecology

## 2021-02-01 ENCOUNTER — Ambulatory Visit (INDEPENDENT_AMBULATORY_CARE_PROVIDER_SITE_OTHER): Payer: Medicaid Other

## 2021-02-01 DIAGNOSIS — N898 Other specified noninflammatory disorders of vagina: Secondary | ICD-10-CM | POA: Diagnosis not present

## 2021-02-01 DIAGNOSIS — Z8619 Personal history of other infectious and parasitic diseases: Secondary | ICD-10-CM | POA: Diagnosis present

## 2021-02-01 NOTE — Progress Notes (Signed)
Patient presents for [facility] for positive Chlamydia and Trich. Patient states that she and her partner completed treatment and abstained from intercourse. She does complain of vaginal discharge with odor. Patient states that she gets recurrent bv after her cycles. Patient advised to schedule an appt with provider to discuss recurrent infection. Self swab completed.  SUBJECTIVE:  40 y.o. female complains of white vaginal discharge for about 3 day(s). Denies abnormal vaginal bleeding or significant pelvic pain or fever. No UTI symptoms. Denies history of known exposure to STD.  No LMP recorded.  OBJECTIVE:  She appears well, afebrile. Urine dipstick: not done.  ASSESSMENT:  Vaginal Discharge  Vaginal Odor   PLAN:  GC, chlamydia, trichomonas, BVAG, CVAG probe sent to lab. Treatment: To be determined once lab results are received ROV prn if symptoms persist or worsen.

## 2021-02-01 NOTE — Progress Notes (Signed)
Patient was assessed and managed by nursing staff during this encounter. I have reviewed the chart and agree with the documentation and plan. I have also made any necessary editorial changes.  Bryanah Sidell, MD 02/01/2021 1:39 PM    

## 2021-02-02 LAB — CERVICOVAGINAL ANCILLARY ONLY
Bacterial Vaginitis (gardnerella): POSITIVE — AB
Candida Glabrata: NEGATIVE
Candida Vaginitis: NEGATIVE
Chlamydia: POSITIVE — AB
Comment: NEGATIVE
Comment: NEGATIVE
Comment: NEGATIVE
Comment: NEGATIVE
Comment: NEGATIVE
Comment: NORMAL
Neisseria Gonorrhea: NEGATIVE
Trichomonas: NEGATIVE

## 2021-02-03 ENCOUNTER — Other Ambulatory Visit: Payer: Self-pay

## 2021-02-03 MED ORDER — AZITHROMYCIN 500 MG PO TABS: ORAL_TABLET | ORAL | 0 refills | Status: DC

## 2021-02-03 MED ORDER — METRONIDAZOLE 0.75 % VA GEL: 1.0000 | Freq: Every day | VAGINAL | 5 refills | Status: DC

## 2021-02-03 MED ORDER — AZITHROMYCIN 500 MG PO TABS: 1000.0000 mg | ORAL_TABLET | Freq: Once | ORAL | 1 refills | Status: AC

## 2021-02-03 MED ORDER — METRONIDAZOLE 500 MG PO TABS: 500.0000 mg | ORAL_TABLET | Freq: Two times a day (BID) | ORAL | 0 refills | Status: DC

## 2021-02-03 NOTE — Addendum Note (Signed)
Addended by: Catalina Antigua on: 02/03/2021 04:36 PM   Modules accepted: Orders

## 2021-02-03 NOTE — Telephone Encounter (Signed)
Patient tested positive for BV and Chlamydia. Will treat per protocol. Patient advised not to have sex for 7-14 days. TOC in three weeks Please inform your partner or partners of this infection. They can be treated at the health department.

## 2021-03-10 ENCOUNTER — Ambulatory Visit (HOSPITAL_COMMUNITY)
Admission: EM | Admit: 2021-03-10 | Discharge: 2021-03-10 | Disposition: A | Discharge status: Home / Self Care | Payer: Medicaid Other | Attending: Emergency Medicine | Admitting: Emergency Medicine

## 2021-03-10 ENCOUNTER — Other Ambulatory Visit: Payer: Self-pay

## 2021-03-10 ENCOUNTER — Encounter (HOSPITAL_COMMUNITY): Payer: Self-pay

## 2021-03-10 DIAGNOSIS — R300 Dysuria: Secondary | ICD-10-CM | POA: Insufficient documentation

## 2021-03-10 LAB — POCT URINALYSIS DIPSTICK, ED / UC
Bilirubin Urine: NEGATIVE
Glucose, UA: NEGATIVE mg/dL
Hgb urine dipstick: NEGATIVE
Ketones, ur: NEGATIVE mg/dL
Leukocytes,Ua: NEGATIVE
Nitrite: NEGATIVE
Protein, ur: NEGATIVE mg/dL
Specific Gravity, Urine: 1.02 (ref 1.005–1.030)
Urobilinogen, UA: 0.2 mg/dL (ref 0.0–1.0)
pH: 6.5 (ref 5.0–8.0)

## 2021-03-10 NOTE — ED Provider Notes (Signed)
[facility]    CSN: [id number] Arrival date & time: 03/10/21  1155      History   Chief Complaint Chief Complaint  Patient presents with  . Dysuria    HPI TYARRA NOLTON is a 40 y.o. female.   Patient presents with dysuria for three days, possible discharge, thinks discharge is present but unsure of consistency or color. Feels "like something is off". Denies itching, odor, frequency, urgency, abdominal pain, flank pain, rash lesion. Had chlamydia and BV  02/01/21. Attest that she did not take medication properly, shared pills with partner. 1 partner, no condom use.   Past Medical History:  Diagnosis Date  . Bacterial vaginitis 03/04/2017  . Gonorrhea   . Labial swelling 03/04/2017  . MRSA (methicillin resistant staph aureus) culture positive 08/06/2014    Patient Active Problem List   Diagnosis Date Noted  . Breakthrough bleeding on Depo-Provera 04/14/2020  . Hidradenitis 04/14/2020  . Gonorrhea 11/20/2019  . Abnormal uterine bleeding (AUB) 11/19/2018  . H/O tubal ligation 03/15/2017  . Bacterial vaginitis 03/04/2017  . Labial swelling 03/04/2017    Past Surgical History:  Procedure Laterality Date  . NO PAST SURGERIES    . TUBAL LIGATION N/A 02/19/2017   Procedure: POST PARTUM TUBAL LIGATION;  Surgeon: [name], MD;  Location: [facility];  Service: Gynecology;  Laterality: N/A;  . WISDOM TOOTH EXTRACTION      OB History    Gravida  4   Para  4   Term  4   Preterm      AB  0   Living  4     SAB      IAB  0   Ectopic      Multiple  0   Live Births  4            Home Medications    Prior to Admission medications   Medication Sig Start Date End Date Taking? Authorizing Provider  azithromycin (ZITHROMAX) 500 MG tablet Take 1 gram by mouth once. Please take with food and return to clinic in three weeks 02/03/21   Constant, Peggy, MD  benzonatate (TESSALON) 100 MG capsule Take 1-2 capsules (100-200 mg total) by mouth  3 (three) times daily as needed for cough. Patient not taking: No sig reported 04/06/20   Wallis Bamberg, PA-C  cetirizine (ZYRTEC ALLERGY) 10 MG tablet Take 1 tablet (10 mg total) by mouth daily. Patient not taking: No sig reported 04/06/20   Wallis Bamberg, PA-C  clindamycin (CLEOCIN) 300 MG capsule Take 1 capsule (300 mg total) by mouth 2 (two) times daily. 10/22/20   Janalyn Harder, MD  doxycycline (VIBRAMYCIN) 100 MG capsule Take 1 capsule (100 mg total) by mouth 2 (two) times daily. 12/23/20   Brock Bad, MD  ethynodiol-ethinyl estradiol (ZOVIA) 1-35 MG-MCG tablet Take 1 tablet by mouth daily. DO NOT TAKE THE INACTIVE (LAST WEEK) OF PILLS. START ON NEW PACK WHEN DONE WITH 1st 3 WEEKS IN PACK 04/14/20 06/13/20  Raelyn Mora, CNM  fluconazole (DIFLUCAN) 150 MG tablet TAKE 1 TABLET BY MOUTH AS ONE DOSE 01/10/21   Brock Bad, MD  guaiFENesin (MUCINEX) 600 MG 12 hr tablet Take by mouth 2 (two) times daily. Patient not taking: No sig reported    [provider]  medroxyPROGESTERone (DEPO-PROVERA) 150 MG/ML injection Inject 1 mL (150 mg total) into the muscle every 3 (three) months. Patient not taking: No sig reported 11/19/19   Calvert Cantor,  CNM  metroNIDAZOLE (FLAGYL) 500 MG tablet Take 1 tablet (500 mg total) by mouth 2 (two) times daily. 02/03/21   Constant, Peggy, MD  metroNIDAZOLE (METROGEL) 0.75 % vaginal gel Place 1 Applicatorful vaginally at bedtime. Apply one applicatorful to vagina at bedtime for 5 days 02/03/21   Constant, Peggy, MD  ondansetron (ZOFRAN) 4 MG tablet Take 1 tablet (4 mg total) by mouth every 8 (eight) hours as needed for nausea or vomiting. Patient not taking: No sig reported 06/23/20   Georgetta Haber, NP  Prenat w/o A-FeCbn-Meth-FA-DHA (PRENATE MINI) 29-0.6-0.4-350 MG CAPS Take 1 capsule by mouth daily before breakfast. Patient not taking: No sig reported 09/09/20   Brock Bad, MD  Prenat-FeAsp-Meth-FA-DHA w/o A (PRENATE PIXIE) 10-0.6-0.4-200 MG  CAPS Take 1 capsule by mouth daily. Patient not taking: No sig reported 09/27/20   Brock Bad, MD  promethazine-dextromethorphan (PROMETHAZINE-DM) 6.25-15 MG/5ML syrup Take 5 mLs by mouth at bedtime as needed for cough. Patient not taking: No sig reported 04/06/20   Wallis Bamberg, PA-C  Pseudoeph-Doxylamine-DM-APAP (NYQUIL PO) Take by mouth. Patient not taking: No sig reported    [provider]  pseudoephedrine (SUDAFED) 60 MG tablet Take 1 tablet (60 mg total) by mouth every 8 (eight) hours as needed for congestion (post-nasal drainage). Patient not taking: No sig reported 04/06/20   Wallis Bamberg, PA-C  rifampin (RIFADIN) 300 MG capsule Take 1 capsule (300 mg total) by mouth 2 (two) times daily. 10/22/20   Janalyn Harder, MD  terconazole (TERAZOL 7) 0.4 % vaginal cream Place 1 applicator vaginally at bedtime. Patient not taking: No sig reported 06/22/20   Warden Fillers, MD  tranexamic acid (LYSTEDA) 650 MG TABS tablet Take 2 tablets (1,300 mg total) by mouth 3 (three) times daily. Take during menses for a maximum of five days Patient not taking: No sig reported 03/22/20   Anyanwu, Jethro Bastos, MD    Family History Family History  Problem Relation Age of Onset  . Hypertension Mother     Social History Social History   Tobacco Use  . Smoking status: Former Smoker    Packs/day: 0.25    Years: 9.00    Pack years: 2.25    Types: Cigarettes    Quit date: 06/09/2012    Years since quitting: 8.7  . Smokeless tobacco: Never Used  . Tobacco comment: pt reports is is smoking   Vaping Use  . Vaping Use: Never used  Substance Use Topics  . Alcohol use: No  . Drug use: No     Allergies   Septra ds [sulfamethoxazole-trimethoprim]   Review of Systems Review of Systems  Constitutional: Negative.   Respiratory: Negative.   Cardiovascular: Negative.   Genitourinary: Positive for dysuria and vaginal discharge. Negative for decreased urine volume, difficulty urinating,  dyspareunia, enuresis, flank pain, frequency, genital sores, hematuria, menstrual problem, pelvic pain, urgency, vaginal bleeding and vaginal pain.     Physical Exam Triage Vital Signs ED Triage Vitals  Enc Vitals Group     BP 03/10/21 1223 122/83     Pulse Rate 03/10/21 1223 80     Resp 03/10/21 1223 18     Temp 03/10/21 1223 98.4 F (36.9 C)     Temp Source 03/10/21 1223 Oral     SpO2 03/10/21 1223 99 %     Weight --      Height --      Head Circumference --      Peak Flow --  Pain Score 03/10/21 1221 0     Pain Loc --      Pain Edu? --      Excl. in [place]? --    No data found.  Updated Vital Signs BP 122/83 (BP Location: Right Arm)   Pulse 80   Temp 98.4 F (36.9 C) (Oral)   Resp 18   LMP  (Within Weeks) Comment: 1 week  SpO2 99%   Visual Acuity Right Eye Distance:   Left Eye Distance:   Bilateral Distance:    Right Eye Near:   Left Eye Near:    Bilateral Near:     Physical Exam Constitutional:      Appearance: Normal appearance. She is normal weight.  Eyes:     Extraocular Movements: Extraocular movements intact.  Pulmonary:     Effort: Pulmonary effort is normal.  Abdominal:     General: Abdomen is flat. Bowel sounds are normal. There is no distension.     Palpations: Abdomen is soft.     Tenderness: There is no abdominal tenderness. There is no right CVA tenderness or left CVA tenderness.  Genitourinary:    Comments: Deferred, self collect vaginal swab Musculoskeletal:        General: Normal range of motion.     Cervical back: Normal range of motion.  Skin:    General: Skin is warm and dry.  Neurological:     Mental Status: She is alert and oriented to person, place, and time. Mental status is at baseline.  Psychiatric:        Mood and Affect: Affect is flat.        Behavior: Behavior normal.      UC Treatments / Results  Labs (all labs ordered are listed, but only abnormal results are displayed) Labs Reviewed  POCT URINALYSIS  DIPSTICK, ED / UC  CERVICOVAGINAL ANCILLARY ONLY    EKG   Radiology No results found.  Procedures Procedures (including critical care time)  Medications Ordered in UC Medications - No data to display  Initial Impression / Assessment and Plan / UC Course  I have reviewed the triage vital signs and the nursing notes.  Pertinent labs & imaging results that were available during my care of the patient were reviewed by me and considered in my medical decision making (see chart for details).  Dysuria  1. Urinalysis- negative 2. sti screen pending, will treat per protocol, advised abstinence until labs result and/or treatment complete  Final Clinical Impressions(s) / UC Diagnoses   Final diagnoses:  Dysuria     Discharge Instructions     Your urinalysis did not show infection today  Sti screening pending 2-3 days, you will be called if positive for treatment   Do not have sex until labs result, if positive do not have sex until you and your partner have completed treatment    ED Prescriptions    None     PDMP not reviewed this encounter.   Valinda Hoar, NP 03/10/21 1313

## 2021-03-10 NOTE — ED Triage Notes (Signed)
Pt reports burning when urinating x 3 days.   Pt request STD's test.

## 2021-03-10 NOTE — Discharge Instructions (Addendum)
Your urinalysis did not show infection today  Sti screening pending 2-3 days, you will be called if positive for treatment   Do not have sex until labs result, if positive do not have sex until you and your partner have completed treatment

## 2021-03-11 ENCOUNTER — Telehealth (HOSPITAL_COMMUNITY): Payer: Self-pay | Admitting: Emergency Medicine

## 2021-03-11 LAB — CERVICOVAGINAL ANCILLARY ONLY
Bacterial Vaginitis (gardnerella): POSITIVE — AB
Candida Glabrata: NEGATIVE
Candida Vaginitis: NEGATIVE
Chlamydia: NEGATIVE
Comment: NEGATIVE
Comment: NEGATIVE
Comment: NEGATIVE
Comment: NEGATIVE
Comment: NEGATIVE
Comment: NORMAL
Neisseria Gonorrhea: NEGATIVE
Trichomonas: NEGATIVE

## 2021-03-11 MED ORDER — METRONIDAZOLE 500 MG PO TABS
500.0000 mg | ORAL_TABLET | Freq: Two times a day (BID) | ORAL | 0 refills | Status: DC
Start: 2021-03-11 — End: 2021-08-09

## 2021-06-06 ENCOUNTER — Other Ambulatory Visit: Payer: Self-pay

## 2021-06-06 ENCOUNTER — Other Ambulatory Visit (HOSPITAL_COMMUNITY)
Admission: RE | Admit: 2021-06-06 | Discharge: 2021-06-06 | Disposition: A | Discharge status: Home / Self Care | Payer: Medicaid Other | Source: Ambulatory Visit | Attending: Obstetrics | Admitting: Obstetrics

## 2021-06-06 ENCOUNTER — Ambulatory Visit: Payer: Medicaid Other

## 2021-06-06 VITALS — BP 134/86 | HR 72

## 2021-06-06 DIAGNOSIS — N898 Other specified noninflammatory disorders of vagina: Secondary | ICD-10-CM | POA: Diagnosis present

## 2021-06-06 NOTE — Progress Notes (Signed)
SUBJECTIVE:  40 y.o. female complains of clear vaginal discharge for a couple of days.. Denies abnormal vaginal bleeding or significant pelvic pain or fever. No UTI symptoms. Denies history of known exposure to STD.  No LMP recorded.  OBJECTIVE:  She appears well, afebrile. Urine dipstick: not done.  ASSESSMENT:  Vaginal Discharge: yes Vaginal Odor: no   PLAN:  GC, chlamydia, trichomonas, BVAG, CVAG probe sent to lab. Treatment: To be determined once lab results are received ROV prn if symptoms persist or worsen.

## 2021-06-07 ENCOUNTER — Other Ambulatory Visit: Payer: Self-pay | Admitting: Obstetrics and Gynecology

## 2021-06-07 LAB — CERVICOVAGINAL ANCILLARY ONLY
Bacterial Vaginitis (gardnerella): POSITIVE — AB
Candida Glabrata: NEGATIVE
Candida Vaginitis: NEGATIVE
Chlamydia: NEGATIVE
Comment: NEGATIVE
Comment: NEGATIVE
Comment: NEGATIVE
Comment: NEGATIVE
Comment: NEGATIVE
Comment: NORMAL
Neisseria Gonorrhea: NEGATIVE
Trichomonas: NEGATIVE

## 2021-06-07 MED ORDER — METRONIDAZOLE 500 MG PO TABS: 500.0000 mg | ORAL_TABLET | Freq: Two times a day (BID) | ORAL | 0 refills | Status: DC

## 2021-08-09 ENCOUNTER — Encounter (HOSPITAL_COMMUNITY): Payer: Self-pay | Admitting: Emergency Medicine

## 2021-08-09 ENCOUNTER — Other Ambulatory Visit: Payer: Self-pay

## 2021-08-09 ENCOUNTER — Ambulatory Visit (HOSPITAL_COMMUNITY)
Admission: EM | Admit: 2021-08-09 | Discharge: 2021-08-09 | Disposition: A | Discharge status: Home / Self Care | Payer: Medicaid Other | Attending: Physician Assistant | Admitting: Physician Assistant

## 2021-08-09 DIAGNOSIS — Z113 Encounter for screening for infections with a predominantly sexual mode of transmission: Secondary | ICD-10-CM | POA: Diagnosis present

## 2021-08-09 DIAGNOSIS — N898 Other specified noninflammatory disorders of vagina: Secondary | ICD-10-CM | POA: Diagnosis not present

## 2021-08-09 DIAGNOSIS — R1030 Lower abdominal pain, unspecified: Secondary | ICD-10-CM | POA: Diagnosis not present

## 2021-08-09 LAB — POC URINE PREG, ED: Preg Test, Ur: NEGATIVE

## 2021-08-09 LAB — POCT URINALYSIS DIPSTICK, ED / UC
Bilirubin Urine: NEGATIVE
Glucose, UA: NEGATIVE mg/dL
Hgb urine dipstick: NEGATIVE
Ketones, ur: NEGATIVE mg/dL
Leukocytes,Ua: NEGATIVE
Nitrite: NEGATIVE
Protein, ur: NEGATIVE mg/dL
Specific Gravity, Urine: 1.015 (ref 1.005–1.030)
Urobilinogen, UA: 1 mg/dL (ref 0.0–1.0)
pH: 8.5 — ABNORMAL HIGH (ref 5.0–8.0)

## 2021-08-09 MED ORDER — METRONIDAZOLE 0.75 % VA GEL: 1.0000 | Freq: Every day | VAGINAL | 0 refills | Status: DC

## 2021-08-09 MED ORDER — METRONIDAZOLE 500 MG PO TABS: 500.0000 mg | ORAL_TABLET | Freq: Two times a day (BID) | ORAL | 0 refills | Status: DC

## 2021-08-09 NOTE — ED Provider Notes (Signed)
[facility]    CSN: [id number] Arrival date & time: 08/09/21  1912      History   Chief Complaint Chief Complaint  Patient presents with   Abdominal Pain   Vaginal Discharge    HPI Jennifer Leach is a 40 y.o. female.   Patient presents today with a 2-day history of lower abdominal pain.  Reports abdominal pain is rated 7 on a 0-10 pain scale, localized to lower abdomen without radiation, described as sharp, no aggravating relieving factors identified.  She did experience some vaginal irritation and discharge so contacted her PCP who sent in Diflucan.  She denies any change in symptoms with this medication.  She does report some vaginal discharge but denies any significant odor.  She has a history of bacterial vaginosis with similar symptoms.  She denies any urinary symptoms including frequency, urgency, hematuria.  Denies any recent urogenital procedure, history of self-catheterization, history of nephrolithiasis, seeing urologist in the past.  Denies any recent antibiotics or changes to personal hygiene products.  Denies any fever, nausea, vomiting, pelvic pain.  Denies history of gastrointestinal disorder.   Past Medical History:  Diagnosis Date   Bacterial vaginitis 03/04/2017   Gonorrhea    Labial swelling 03/04/2017   MRSA (methicillin resistant staph aureus) culture positive 08/06/2014    Patient Active Problem List   Diagnosis Date Noted   Breakthrough bleeding on Depo-Provera 04/14/2020   Hidradenitis 04/14/2020   Gonorrhea 11/20/2019   Abnormal uterine bleeding (AUB) 11/19/2018   H/O tubal ligation 03/15/2017   Bacterial vaginitis 03/04/2017   Labial swelling 03/04/2017    Past Surgical History:  Procedure Laterality Date   NO PAST SURGERIES     TUBAL LIGATION N/A 02/19/2017   Procedure: POST PARTUM TUBAL LIGATION;  Surgeon: [name], MD;  Location: [facility];  Service: Gynecology;  Laterality: N/A;   WISDOM TOOTH EXTRACTION       OB History     Gravida  4   Para  4   Term  4   Preterm      AB  0   Living  4      SAB      IAB  0   Ectopic      Multiple  0   Live Births  4            Home Medications    Prior to Admission medications   Medication Sig Start Date End Date Taking? Authorizing Provider  metroNIDAZOLE (METROGEL VAGINAL) 0.75 % vaginal gel Place 1 Applicatorful vaginally at bedtime. 08/09/21  Yes Braelynn Lupton, Noberto Retort, PA-C  cetirizine (ZYRTEC ALLERGY) 10 MG tablet Take 1 tablet (10 mg total) by mouth daily. Patient not taking: No sig reported 04/06/20   Wallis Bamberg, PA-C  medroxyPROGESTERone (DEPO-PROVERA) 150 MG/ML injection Inject 1 mL (150 mg total) into the muscle every 3 (three) months. Patient not taking: No sig reported 11/19/19   Clayton Bibles C, CNM  Pseudoeph-Doxylamine-DM-APAP (NYQUIL PO) Take by mouth. Patient not taking: No sig reported    [provider]  terconazole (TERAZOL 7) 0.4 % vaginal cream Place 1 applicator vaginally at bedtime. Patient not taking: No sig reported 06/22/20   Warden Fillers, MD  tranexamic acid (LYSTEDA) 650 MG TABS tablet Take 2 tablets (1,300 mg total) by mouth 3 (three) times daily. Take during menses for a maximum of five days Patient not taking: No sig reported 03/22/20   Anyanwu, Jethro Bastos, MD  Family History Family History  Problem Relation Age of Onset   Hypertension Mother     Social History Social History   Tobacco Use   Smoking status: Former    Packs/day: 0.25    Years: 9.00    Pack years: 2.25    Types: Cigarettes    Quit date: 06/09/2012    Years since quitting: 9.1   Smokeless tobacco: Never   Tobacco comments:    pt reports is is smoking   Vaping Use   Vaping Use: Never used  Substance Use Topics   Alcohol use: No   Drug use: No     Allergies   Septra ds [sulfamethoxazole-trimethoprim]   Review of Systems Review of Systems  Constitutional:  Negative for activity change, appetite change,  fatigue and fever.  Respiratory:  Negative for cough and shortness of breath.   Cardiovascular:  Negative for chest pain.  Gastrointestinal:  Positive for abdominal pain. Negative for diarrhea, nausea and vomiting.  Genitourinary:  Positive for vaginal discharge. Negative for dysuria, frequency, pelvic pain, urgency, vaginal bleeding and vaginal pain.  Neurological:  Negative for dizziness, light-headedness and headaches.    Physical Exam Triage Vital Signs ED Triage Vitals [08/09/21 2011]  Enc Vitals Group     BP (!) 129/93     Pulse Rate 80     Resp 18     Temp 98.3 F (36.8 C)     Temp src      SpO2 100 %     Weight      Height      Head Circumference      Peak Flow      Pain Score 7     Pain Loc      Pain Edu?      Excl. in [place]?    No data found.  Updated Vital Signs BP (!) 129/93   Pulse 80   Temp 98.3 F (36.8 C)   Resp 18   SpO2 100%   Visual Acuity Right Eye Distance:   Left Eye Distance:   Bilateral Distance:    Right Eye Near:   Left Eye Near:    Bilateral Near:     Physical Exam Vitals reviewed.  Constitutional:      General: She is awake. She is not in acute distress.    Appearance: Normal appearance. She is well-developed. She is not ill-appearing.     Comments: Very pleasant female appears stated age in no acute distress sitting comfortably in exam room  HENT:     Head: Normocephalic and atraumatic.  Cardiovascular:     Rate and Rhythm: Normal rate and regular rhythm.     Heart sounds: Normal heart sounds, S1 normal and S2 normal. No murmur heard. Pulmonary:     Effort: Pulmonary effort is normal.     Breath sounds: Normal breath sounds. No wheezing, rhonchi or rales.     Comments: Clear to auscultation bilaterally Abdominal:     General: Bowel sounds are normal.     Palpations: Abdomen is soft.     Tenderness: There is no abdominal tenderness. There is no right CVA tenderness, left CVA tenderness, guarding or rebound.     Comments: No  tenderness palpation.  Benign abdominal exam.  Genitourinary:    Comments: Exam deferred Psychiatric:        Behavior: Behavior is cooperative.     UC Treatments / Results  Labs (all labs ordered are listed, but only abnormal results are displayed)  Labs Reviewed  POCT URINALYSIS DIPSTICK, ED / UC - Abnormal; Notable for the following components:      Result Value   pH 8.5 (*)    All other components within normal limits  POC URINE PREG, ED  CERVICOVAGINAL ANCILLARY ONLY    EKG   Radiology No results found.  Procedures Procedures (including critical care time)  Medications Ordered in UC Medications - No data to display  Initial Impression / Assessment and Plan / UC Course  I have reviewed the triage vital signs and the nursing notes.  Pertinent labs & imaging results that were available during my care of the patient were reviewed by me and considered in my medical decision making (see chart for details).     Vital signs and physical exam reassuring today; no indication for emergent evaluation or imaging.  UA showed no evidence of infection and patient denies any urinary symptoms.  Urine pregnancy was negative.  STI swab was collected-results pending.  Patient was encouraged to drink plenty of fluid.  She does have a history of bacterial vaginosis and was treated empirically for this given associated vaginal discharge.  Patient declined pills the often upset her stomach and so was prescribed MetroGel but still instructed to avoid alcohol due to Antabuse side effects associated with this medication.  Discussed that we do not have access to imaging and if she has persistent/worsening symptoms she needs to be evaluated by her OB/GYN or if severe go to the emergency room for further evaluation and management.  Discussed alarm symptoms that warrant going to the ER including worsening abdominal pain, fever, nausea, vomiting.  Should return precautions given to which she expressed  understanding.  Final Clinical Impressions(s) / UC Diagnoses   Final diagnoses:  Lower abdominal pain  Vaginal discharge  Routine screening for STI (sexually transmitted infection)     Discharge Instructions      Urine was normal.  We are going to treat you for bacterial vaginosis.  Please use metronidazole gel at night for 1 week.  You should not drink any alcohol while taking this medication as it will cause you to vomit.  Wear loosefitting cotton underwear and use hypoallergenic soaps and detergents.  We will contact you if we need to arrange any additional treatment based on your swab result.  If you have any worsening symptoms you need to go to the emergency room to consider CT scan as we discussed.  Make sure you are resting and drinking plenty of fluid.  Follow-up with your primary care provider or OB/GYN within a few days to ensure symptom improvement.     ED Prescriptions     Medication Sig Dispense Auth. Provider   metroNIDAZOLE (FLAGYL) 500 MG tablet  (Status: Discontinued) Take 1 tablet (500 mg total) by mouth 2 (two) times daily. 14 tablet Aisley Whan K, PA-C   metroNIDAZOLE (METROGEL VAGINAL) 0.75 % vaginal gel Place 1 Applicatorful vaginally at bedtime. 70 g Elazar Argabright, Noberto Retort, PA-C      PDMP not reviewed this encounter.   Jeani Hawking, PA-C 08/09/21 2105

## 2021-08-09 NOTE — ED Triage Notes (Signed)
Pt is present today with lower abdominal pain and vaginal discharge. Pt states sx started x2 days ago

## 2021-08-09 NOTE — Discharge Instructions (Addendum)
Urine was normal.  We are going to treat you for bacterial vaginosis.  Please use metronidazole gel at night for 1 week.  You should not drink any alcohol while taking this medication as it will cause you to vomit.  Wear loosefitting cotton underwear and use hypoallergenic soaps and detergents.  We will contact you if we need to arrange any additional treatment based on your swab result.  If you have any worsening symptoms you need to go to the emergency room to consider CT scan as we discussed.  Make sure you are resting and drinking plenty of fluid.  Follow-up with your primary care provider or OB/GYN within a few days to ensure symptom improvement.

## 2021-08-10 LAB — CERVICOVAGINAL ANCILLARY ONLY
Bacterial Vaginitis (gardnerella): POSITIVE — AB
Candida Glabrata: NEGATIVE
Candida Vaginitis: NEGATIVE
Chlamydia: NEGATIVE
Comment: NEGATIVE
Comment: NEGATIVE
Comment: NEGATIVE
Comment: NEGATIVE
Comment: NEGATIVE
Comment: NORMAL
Neisseria Gonorrhea: NEGATIVE
Trichomonas: NEGATIVE

## 2021-09-07 ENCOUNTER — Ambulatory Visit: Payer: Medicaid Other | Admitting: Obstetrics

## 2021-09-07 ENCOUNTER — Other Ambulatory Visit: Payer: Self-pay

## 2021-09-07 ENCOUNTER — Encounter: Payer: Self-pay | Admitting: Obstetrics

## 2021-09-07 ENCOUNTER — Other Ambulatory Visit (HOSPITAL_COMMUNITY)
Admission: RE | Admit: 2021-09-07 | Discharge: 2021-09-07 | Disposition: A | Discharge status: Home / Self Care | Payer: Medicaid Other | Source: Ambulatory Visit | Attending: Obstetrics | Admitting: Obstetrics

## 2021-09-07 VITALS — Ht 66.0 in | Wt 176.1 lb

## 2021-09-07 DIAGNOSIS — N898 Other specified noninflammatory disorders of vagina: Secondary | ICD-10-CM | POA: Diagnosis not present

## 2021-09-07 DIAGNOSIS — B3731 Acute candidiasis of vulva and vagina: Secondary | ICD-10-CM

## 2021-09-07 DIAGNOSIS — N939 Abnormal uterine and vaginal bleeding, unspecified: Secondary | ICD-10-CM

## 2021-09-07 DIAGNOSIS — N930 Postcoital and contact bleeding: Secondary | ICD-10-CM | POA: Diagnosis not present

## 2021-09-07 DIAGNOSIS — Z113 Encounter for screening for infections with a predominantly sexual mode of transmission: Secondary | ICD-10-CM | POA: Diagnosis not present

## 2021-09-07 DIAGNOSIS — R110 Nausea: Secondary | ICD-10-CM

## 2021-09-07 DIAGNOSIS — R102 Pelvic and perineal pain: Secondary | ICD-10-CM

## 2021-09-07 DIAGNOSIS — Z01419 Encounter for gynecological examination (general) (routine) without abnormal findings: Secondary | ICD-10-CM | POA: Diagnosis not present

## 2021-09-07 LAB — POCT URINALYSIS DIPSTICK
Bilirubin, UA: NEGATIVE
Blood, UA: NEGATIVE
Glucose, UA: NEGATIVE
Ketones, UA: NEGATIVE
Leukocytes, UA: NEGATIVE
Nitrite, UA: NEGATIVE
Protein, UA: NEGATIVE
Spec Grav, UA: 1.015 (ref 1.010–1.025)
Urobilinogen, UA: 0.2 E.U./dL
pH, UA: 6 (ref 5.0–8.0)

## 2021-09-07 MED ORDER — FLUCONAZOLE 150 MG PO TABS: 150.0000 mg | ORAL_TABLET | Freq: Once | ORAL | 0 refills | Status: AC

## 2021-09-07 MED ORDER — PROMETHAZINE HCL 25 MG PO TABS: 25.0000 mg | ORAL_TABLET | Freq: Four times a day (QID) | ORAL | 1 refills | Status: DC | PRN

## 2021-09-07 MED ORDER — METRONIDAZOLE 500 MG PO TABS: 500.0000 mg | ORAL_TABLET | Freq: Two times a day (BID) | ORAL | 0 refills | Status: DC

## 2021-09-07 MED ORDER — TRANEXAMIC ACID 650 MG PO TABS: 1300.0000 mg | ORAL_TABLET | Freq: Three times a day (TID) | ORAL | 2 refills | Status: DC

## 2021-09-07 MED ORDER — IBUPROFEN 800 MG PO TABS: 800.0000 mg | ORAL_TABLET | Freq: Three times a day (TID) | ORAL | 5 refills | Status: DC | PRN

## 2021-09-07 MED ORDER — DOXYCYCLINE HYCLATE 100 MG PO CAPS: 100.0000 mg | ORAL_CAPSULE | Freq: Two times a day (BID) | ORAL | 0 refills | Status: DC

## 2021-09-07 NOTE — Progress Notes (Signed)
Pt presents for annual, pap, and all STD testing. Pt reports malodorous, itching vaginal discharge and low abdominal pain since yesterday.  Normal pap 11/2018

## 2021-09-07 NOTE — Progress Notes (Signed)
Subjective:        Jennifer Leach is a 40 y.o. female here for a routine exam.  Current complaints: Vaginal discharge and painful , heavy periods.  Has been on Lysteda in the past with some success.  She is multigravida ( G4 P4 ), had BTL done after last delivery; and is finished her fertility, and would just like to definitively get rid of the heavy and painful periods that interfere with work and daily activities of life.  Personal health questionnaire:  Is patient Ashkenazi Jewish, have a family history of breast and/or ovarian cancer: no Is there a family history of uterine cancer diagnosed at age < 10, gastrointestinal cancer, urinary tract cancer, family member who is a Personnel officer syndrome-associated carrier: no Is the patient overweight and hypertensive, family history of diabetes, personal history of gestational diabetes, preeclampsia or PCOS: no Is patient over 41, have PCOS,  family history of premature CHD under age 91, diabetes, smoke, have hypertension or peripheral artery disease:  no At any time, has a partner hit, kicked or otherwise hurt or frightened you?: no Over the past 2 weeks, have you felt down, depressed or hopeless?: no Over the past 2 weeks, have you felt little interest or pleasure in doing things?:no   Gynecologic History Patient's last menstrual period was 08/24/2021. Contraception: tubal ligation Last Pap: 2020. Results were: normal Last mammogram: n/a. Results were: n/a  Obstetric History OB History  Gravida Para Term Preterm AB Living  4 4 4    0 4  SAB IAB Ectopic Multiple Live Births    0   0 4    # Outcome Date GA Lbr Len/2nd Weight Sex Delivery Anes PTL Lv  4 Term 02/19/17 [redacted]w[redacted]d 379:00 / 00:30 8 lb 3.2 oz (3.72 kg) F Vag-Spont EPI  LIV  3 Term 09/03/10    M Vag-Spont   LIV  2 Term 09/27/06    F Vag-Spont   LIV  1 Term 07/03/02    07/05/02   LIV    Past Medical History:  Diagnosis Date   Bacterial vaginitis 03/04/2017   Gonorrhea     Labial swelling 03/04/2017   MRSA (methicillin resistant staph aureus) culture positive 08/06/2014    Past Surgical History:  Procedure Laterality Date   NO PAST SURGERIES     TUBAL LIGATION N/A 02/19/2017   Procedure: POST PARTUM TUBAL LIGATION;  Surgeon: 02/21/2017, MD;  [facility]: [facility];  Service: Gynecology;  Laterality: N/A;   WISDOM TOOTH EXTRACTION       Current Outpatient Medications:    doxycycline (VIBRAMYCIN) 100 MG capsule, Take 1 capsule (100 mg total) by mouth 2 (two) times daily., Disp: 28 capsule, Rfl: 0   fluconazole (DIFLUCAN) 150 MG tablet, Take 1 tablet (150 mg total) by mouth once for 1 dose., Disp: 1 tablet, Rfl: 0   ibuprofen (ADVIL) 800 MG tablet, Take 1 tablet (800 mg total) by mouth every 8 (eight) hours as needed., Disp: 30 tablet, Rfl: 5   promethazine (PHENERGAN) 25 MG tablet, Take 1 tablet (25 mg total) by mouth every 6 (six) hours as needed for nausea or vomiting., Disp: 30 tablet, Rfl: 1   cetirizine (ZYRTEC ALLERGY) 10 MG tablet, Take 1 tablet (10 mg total) by mouth daily. (Patient not taking: Reported on 04/14/2020), Disp: 30 tablet, Rfl: 0   metroNIDAZOLE (FLAGYL) 500 MG tablet, Take 1 tablet (500 mg total) by mouth 2 (two) times daily., Disp: 28 tablet, Rfl: 0  metroNIDAZOLE (METROGEL VAGINAL) 0.75 % vaginal gel, Place 1 Applicatorful vaginally at bedtime. (Patient not taking: Reported on 09/07/2021), Disp: 70 g, Rfl: 0   Pseudoeph-Doxylamine-DM-APAP (NYQUIL PO), Take by mouth. (Patient not taking: Reported on 04/14/2020), Disp: , Rfl:    terconazole (TERAZOL 7) 0.4 % vaginal cream, Place 1 applicator vaginally at bedtime. (Patient not taking: Reported on 07/26/2020), Disp: 45 g, Rfl: 0   tranexamic acid (LYSTEDA) 650 MG TABS tablet, Take 2 tablets (1,300 mg total) by mouth 3 (three) times daily. Take during menses for a maximum of five days, Disp: 30 tablet, Rfl: 2 Allergies  Allergen Reactions   Septra Ds [Sulfamethoxazole-Trimethoprim]  Anaphylaxis, Swelling, Rash and Other (See Comments)    Reaction:  Facial swelling     Social History   Tobacco Use   Smoking status: Every Day    Packs/day: 0.25    Years: 9.00    Pack years: 2.25    Types: Cigarettes    Last attempt to quit: 06/09/2012    Years since quitting: 9.2   Smokeless tobacco: Never   Tobacco comments:    pt reports is is smoking   Substance Use Topics   Alcohol use: No    Family History  Problem Relation Age of Onset   Hypertension Mother       Review of Systems  Constitutional: negative for fatigue and weight loss Respiratory: negative for cough and wheezing Cardiovascular: negative for chest pain, fatigue and palpitations Gastrointestinal: negative for abdominal pain and change in bowel habits Musculoskeletal:negative for myalgias Neurological: negative for gait problems and tremors Behavioral/Psych: negative for abusive relationship, depression Endocrine: negative for temperature intolerance    Genitourinary: positive for abnormal menstrual periods, vaginal discharge, pelvic pain and vaginal bleeding with intercourse.  negative for  genital lesions, hot flashes, sexual problems  Integument/breast: negative for breast lump, breast tenderness, nipple discharge and skin lesion(s)    Objective:       Ht 5\' 6"  (1.676 m)   Wt 176 lb 1.6 oz (79.9 kg)   LMP 08/24/2021   BMI 28.42 kg/m  General:   Alert and no distress  Skin:   no rash or abnormalities  Lungs:   clear to auscultation bilaterally  Heart:   regular rate and rhythm, S1, S2 normal, no murmur, click, rub or gallop  Breasts:   normal without suspicious masses, skin or nipple changes or axillary nodes  Abdomen:  normal findings: no organomegaly, soft, non-tender and no hernia  Pelvis:  External genitalia: normal general appearance Urinary system: urethral meatus normal and bladder without fullness, nontender Vaginal: normal without tenderness, induration or masses Cervix: normal  appearance Adnexa: normal bimanual exam Uterus: anteverted and non-tender, normal size   Lab Review Urine pregnancy test Labs reviewed yes Radiologic studies reviewed no  I have spent a total of 20 minutes of face-to-face time, excluding clinical staff time, reviewing notes and preparing to see patient, ordering tests and/or medications, and counseling the patient.   Assessment:   1. Encounter for routine gynecological examination with Papanicolaou smear of cervix Rx: - Cytology - PAP( [place]) - POCT Urinalysis Dipstick  2. Vaginal discharge Rx: - Cervicovaginal ancillary only( [place])  3. Screening for STD (sexually transmitted disease) Rx: - Hepatitis B surface antigen - Hepatitis C antibody - RPR - HIV Antibody (routine testing w rflx)  4. Postcoital and contact bleeding Rx: - doxycycline (VIBRAMYCIN) 100 MG capsule; Take 1 capsule (100 mg total) by mouth 2 (two) times daily.  Dispense: 28 capsule; Refill: 0 - metroNIDAZOLE (FLAGYL) 500 MG tablet; Take 1 tablet (500 mg total) by mouth 2 (two) times daily.  Dispense: 28 tablet; Refill: 0  5. Pelvic pain Rx: - US PELVIC COMPLETE WITH TRANSVAGINAL; Future    Plan:    Education reviewed: calcium supplements, depression evaluation, low fat, low cholesterol diet, safe sex/STD prevention, self breast exams, smoking cessation, and weight bearing exercise. Follow up in: 1 year.   Meds ordered this encounter  Medications   doxycycline (VIBRAMYCIN) 100 MG capsule    Sig: Take 1 capsule (100 mg total) by mouth 2 (two) times daily.    Dispense:  28 capsule    Refill:  0   DISCONTD: metroNIDAZOLE (FLAGYL) 500 MG tablet    Sig: Take 1 tablet (500 mg total) by mouth 2 (two) times daily.    Dispense:  28 tablet    Refill:  0   promethazine (PHENERGAN) 25 MG tablet    Sig: Take 1 tablet (25 mg total) by mouth every 6 (six) hours as needed for nausea or vomiting.    Dispense:  30 tablet    Refill:  1   tranexamic  acid (LYSTEDA) 650 MG TABS tablet    Sig: Take 2 tablets (1,300 mg total) by mouth 3 (three) times daily. Take during menses for a maximum of five days    Dispense:  30 tablet    Refill:  2   ibuprofen (ADVIL) 800 MG tablet    Sig: Take 1 tablet (800 mg total) by mouth every 8 (eight) hours as needed.    Dispense:  30 tablet    Refill:  5   metroNIDAZOLE (FLAGYL) 500 MG tablet    Sig: Take 1 tablet (500 mg total) by mouth 2 (two) times daily.    Dispense:  28 tablet    Refill:  0   fluconazole (DIFLUCAN) 150 MG tablet    Sig: Take 1 tablet (150 mg total) by mouth once for 1 dose.    Dispense:  1 tablet    Refill:  0    Orders Placed This Encounter  Procedures   US PELVIC COMPLETE WITH TRANSVAGINAL    Standing Status:   Future    Standing Expiration Date:   09/07/2022    Order Specific Question:   Reason for Exam (SYMPTOM  OR DIAGNOSIS REQUIRED)    Answer:   Pelvic pain    Order Specific Question:   Preferred imaging location?    Answer:   [facility] Ultrasound   Hepatitis B surface antigen   Hepatitis C antibody   RPR   HIV Antibody (routine testing w rflx)   POCT Urinalysis Dipstick     Brock Bad, MD 09/07/2021 12:15 PM

## 2021-09-08 ENCOUNTER — Other Ambulatory Visit: Payer: Medicaid Other

## 2021-09-08 LAB — CERVICOVAGINAL ANCILLARY ONLY
Bacterial Vaginitis (gardnerella): POSITIVE — AB
Candida Glabrata: NEGATIVE
Candida Vaginitis: NEGATIVE
Chlamydia: NEGATIVE
Comment: NEGATIVE
Comment: NEGATIVE
Comment: NEGATIVE
Comment: NEGATIVE
Comment: NEGATIVE
Comment: NORMAL
Neisseria Gonorrhea: NEGATIVE
Trichomonas: NEGATIVE

## 2021-09-09 ENCOUNTER — Other Ambulatory Visit: Payer: Self-pay | Admitting: Obstetrics

## 2021-09-12 ENCOUNTER — Other Ambulatory Visit: Payer: Self-pay

## 2021-09-12 ENCOUNTER — Ambulatory Visit
Admission: RE | Admit: 2021-09-12 | Discharge: 2021-09-12 | Disposition: A | Discharge status: Home / Self Care | Payer: Medicaid Other | Source: Ambulatory Visit | Attending: Obstetrics | Admitting: Obstetrics

## 2021-09-12 DIAGNOSIS — R102 Pelvic and perineal pain: Secondary | ICD-10-CM | POA: Insufficient documentation

## 2021-09-13 LAB — CYTOLOGY - PAP
Comment: NEGATIVE
Diagnosis: NEGATIVE
Diagnosis: REACTIVE
High risk HPV: NEGATIVE

## 2021-09-14 ENCOUNTER — Other Ambulatory Visit: Payer: Self-pay | Admitting: Obstetrics

## 2021-09-16 ENCOUNTER — Telehealth: Payer: Self-pay

## 2021-09-16 NOTE — Telephone Encounter (Signed)
Returning pt phone call. Pt verified name and date of birth.  Pt aware of u/s results and aptima swab results. Pt states she already has medication for bv, advised pt to continue taking until course of medication is finished. Pt has appt with Dr. Clearance Coots on 12/14 to discus u/s with pt in greater detail.

## 2021-09-21 ENCOUNTER — Encounter: Payer: Self-pay | Admitting: Obstetrics

## 2021-09-21 ENCOUNTER — Telehealth (INDEPENDENT_AMBULATORY_CARE_PROVIDER_SITE_OTHER): Payer: Medicaid Other | Admitting: Obstetrics

## 2021-09-21 DIAGNOSIS — R102 Pelvic and perineal pain: Secondary | ICD-10-CM

## 2021-09-21 DIAGNOSIS — R9389 Abnormal findings on diagnostic imaging of other specified body structures: Secondary | ICD-10-CM | POA: Diagnosis not present

## 2021-09-21 DIAGNOSIS — N939 Abnormal uterine and vaginal bleeding, unspecified: Secondary | ICD-10-CM

## 2021-09-21 MED ORDER — MEDROXYPROGESTERONE ACETATE 10 MG PO TABS: 10.0000 mg | ORAL_TABLET | Freq: Every day | ORAL | 0 refills | Status: DC

## 2021-09-21 NOTE — Progress Notes (Addendum)
GYNECOLOGY VIRTUAL VISIT ENCOUNTER NOTE  Provider location: [facility]   Patient location: Home  I connected with [name] on 09/21/21 at  9:35 AM EST by MyChart Video Encounter and verified that I am speaking with the correct person using two identifiers.   I discussed the limitations, risks, security and privacy concerns of performing an evaluation and management service virtually and the availability of in person appointments. I also discussed with the patient that there may be a patient responsible charge related to this service. The patient expressed understanding and agreed to proceed.   History:  Jennifer Leach is a 40 y.o. [phone] female being evaluated today for AUB and pelvic pain.  Ultrasound done and she presents today for resultsShe denies any abnormal vaginal discharge, bleeding, pelvic pain or other concerns.       Past Medical History:  Diagnosis Date   Bacterial vaginitis 03/04/2017   Gonorrhea    Labial swelling 03/04/2017   MRSA (methicillin resistant staph aureus) culture positive 08/06/2014   Past Surgical History:  Procedure Laterality Date   NO PAST SURGERIES     TUBAL LIGATION N/A 02/19/2017   Procedure: POST PARTUM TUBAL LIGATION;  Surgeon: [name], MD;  Location: [facility];  Service: Gynecology;  Laterality: N/A;   WISDOM TOOTH EXTRACTION     The following portions of the patient's history were reviewed and updated as appropriate: allergies, current medications, past family history, past medical history, past social history, past surgical history and problem list.   Health Maintenance:  Normal pap and negative HRHPV on 09-07-2021.    Review of Systems:  Pertinent items noted in HPI and remainder of comprehensive ROS otherwise negative.  Physical Exam:   General:  Alert, oriented and cooperative. Patient appears to be in no acute distress.  Mental Status: Normal mood and affect. Normal behavior.  Normal judgment and thought content.   Respiratory: Normal respiratory effort, no problems with respiration noted  Rest of physical exam deferred due to type of encounter  Labs and Imaging Results for orders placed or performed in visit on 09/07/21 (from the past 336 hour(s))  POCT Urinalysis Dipstick   Collection Time: 09/07/21  5:01 PM  Result Value Ref Range   Color, UA yellow    Clarity, UA cloudy    Glucose, UA Negative Negative   Bilirubin, UA neg    Ketones, UA neg    Spec Grav, UA 1.015 1.010 - 1.025   Blood, UA neg    pH, UA 6.0 5.0 - 8.0   Protein, UA Negative Negative   Urobilinogen, UA 0.2 0.2 or 1.0 E.U./dL   Nitrite, UA neg    Leukocytes, UA Negative Negative   Appearance     Odor     US PELVIC COMPLETE WITH TRANSVAGINAL  Result Date: 09/13/2021 CLINICAL DATA:  Pelvic pain EXAM: TRANSABDOMINAL AND TRANSVAGINAL ULTRASOUND OF PELVIS TECHNIQUE: Both transabdominal and transvaginal ultrasound examinations of the pelvis were performed. Transabdominal technique was performed for global imaging of the pelvis including uterus, ovaries, adnexal regions, and pelvic cul-de-sac. It was necessary to proceed with endovaginal exam following the transabdominal exam to visualize the uterus endometrium ovaries. COMPARISON:  None FINDINGS: Uterus Measurements: 10.3 x 4.8 x 5.9 cm = volume: 149.8 mL. No fibroids or other mass visualized. Endometrium Thickness: 20 mm.  No focal abnormality visualized. Right ovary Measurements: 3.7 x 1.9 x 2.8 cm = volume: 10.4 mL. Normal appearance/no adnexal mass. Left  ovary Measurements: 4.1 x 2.1 x 2.4 cm = volume: 10.8 mL. Normal appearance/no adnexal mass. Other findings No abnormal free fluid. IMPRESSION: 1. Endometrial thickness of 20 mm. Endometrial thickness is considered abnormal. Consider follow-up by [place] in 6-8 weeks, during the week immediately following menses (exam timing is critical). 2. Otherwise negative pelvic ultrasound. Electronically Signed    By: Jasmine Pang M.D.   On: 09/13/2021 15:09       Assessment and Plan:     1. Pelvic pain - patient to follow up with Dr. Alysia Penna for Surgical Consultation  2. Abnormal uterine bleeding (AUB) - taking Lysteda - thickened endometrium on ultrasound  3. Endometrial thickening on ultrasound - may be related to Lysteda - will repeat u/S in 6-8 weeks during the proliferative phase of cycle right after the period        I discussed the assessment and treatment plan with the patient. The patient was provided an opportunity to ask questions and all were answered. The patient agreed with the plan and demonstrated an understanding of the instructions.   The patient was advised to call back or seek an in-person evaluation/go to the ED if the symptoms worsen or if the condition fails to improve as anticipated.  I have spent a total of 15 minutes of non-face-to-face time, excluding clinical staff time, reviewing notes and preparing to see patient, ordering tests and/or medications, and counseling the patient.    Coral Ceo, MD [facility] for [facility], [facility]  09/21/21

## 2021-09-21 NOTE — Addendum Note (Signed)
Addended by: Coral Ceo A on: 09/21/2021 12:17 PM   Modules accepted: Orders

## 2021-09-21 NOTE — Progress Notes (Signed)
I connected with  Jennifer Leach on 09/21/21 by a video enabled telemedicine application and verified that I am speaking with the correct person using two identifiers.   I discussed the limitations of evaluation and management by telemedicine. The patient expressed understanding and agreed to proceed.   [facility] FU for [place] results

## 2021-10-12 ENCOUNTER — Encounter: Payer: Self-pay | Admitting: Obstetrics and Gynecology

## 2021-10-12 ENCOUNTER — Telehealth: Payer: Medicaid Other | Admitting: Obstetrics and Gynecology

## 2021-10-12 ENCOUNTER — Other Ambulatory Visit: Payer: Self-pay

## 2021-10-12 DIAGNOSIS — N939 Abnormal uterine and vaginal bleeding, unspecified: Secondary | ICD-10-CM

## 2021-10-12 NOTE — Progress Notes (Signed)
Unable to connect with pt. She will be rescheudled

## 2021-10-13 ENCOUNTER — Other Ambulatory Visit: Payer: Self-pay

## 2021-10-13 ENCOUNTER — Ambulatory Visit: Payer: Medicaid Other

## 2021-10-13 DIAGNOSIS — B379 Candidiasis, unspecified: Secondary | ICD-10-CM

## 2021-10-13 MED ORDER — FLUCONAZOLE 150 MG PO TABS: 150.0000 mg | ORAL_TABLET | Freq: Once | ORAL | 0 refills | Status: AC

## 2021-10-17 ENCOUNTER — Other Ambulatory Visit (HOSPITAL_COMMUNITY)
Admission: RE | Admit: 2021-10-17 | Discharge: 2021-10-17 | Disposition: A | Discharge status: Home / Self Care | Payer: Medicaid Other | Source: Ambulatory Visit | Attending: Obstetrics and Gynecology | Admitting: Obstetrics and Gynecology

## 2021-10-17 ENCOUNTER — Other Ambulatory Visit: Payer: Self-pay

## 2021-10-17 ENCOUNTER — Ambulatory Visit (INDEPENDENT_AMBULATORY_CARE_PROVIDER_SITE_OTHER): Payer: Medicaid Other | Admitting: *Deleted

## 2021-10-17 VITALS — BP 121/82 | HR 76

## 2021-10-17 DIAGNOSIS — N898 Other specified noninflammatory disorders of vagina: Secondary | ICD-10-CM | POA: Insufficient documentation

## 2021-10-17 DIAGNOSIS — Z113 Encounter for screening for infections with a predominantly sexual mode of transmission: Secondary | ICD-10-CM | POA: Diagnosis not present

## 2021-10-17 NOTE — Progress Notes (Signed)
SUBJECTIVE:  40 y.o. female complains of white and thick vaginal discharge for 4 day(s). Denies abnormal vaginal bleeding or significant pelvic pain or fever. No UTI symptoms. Denies history of known exposure to STD.  LMP 09/25/21  OBJECTIVE:  She appears well, afebrile. Urine dipstick: not done.  ASSESSMENT:  Vaginal Discharge  Vaginal Odor   PLAN:  GC, chlamydia, trichomonas, BVAG, CVAG probe sent to lab. Treatment: To be determined once lab results are received ROV prn if symptoms persist or worsen.

## 2021-10-18 LAB — CERVICOVAGINAL ANCILLARY ONLY
Bacterial Vaginitis (gardnerella): POSITIVE — AB
Candida Glabrata: NEGATIVE
Candida Vaginitis: POSITIVE — AB
Chlamydia: NEGATIVE
Comment: NEGATIVE
Comment: NEGATIVE
Comment: NEGATIVE
Comment: NEGATIVE
Comment: NEGATIVE
Comment: NORMAL
Neisseria Gonorrhea: NEGATIVE
Trichomonas: NEGATIVE

## 2021-10-20 MED ORDER — FLUCONAZOLE 150 MG PO TABS: 150.0000 mg | ORAL_TABLET | Freq: Once | ORAL | 0 refills | Status: AC

## 2021-10-20 MED ORDER — METRONIDAZOLE 500 MG PO TABS: 500.0000 mg | ORAL_TABLET | Freq: Two times a day (BID) | ORAL | 0 refills | Status: DC

## 2021-10-20 NOTE — Addendum Note (Signed)
Addended by: Mora Bellman on: 10/20/2021 11:55 AM   Modules accepted: Orders

## 2021-11-15 ENCOUNTER — Telehealth (HOSPITAL_COMMUNITY): Payer: Self-pay

## 2021-11-15 ENCOUNTER — Telehealth (HOSPITAL_COMMUNITY): Payer: Self-pay | Admitting: Emergency Medicine

## 2021-11-15 ENCOUNTER — Ambulatory Visit (HOSPITAL_COMMUNITY)
Admission: EM | Admit: 2021-11-15 | Discharge: 2021-11-15 | Disposition: A | Discharge status: Home / Self Care | Payer: Medicaid Other | Attending: Family Medicine | Admitting: Family Medicine

## 2021-11-15 ENCOUNTER — Encounter (HOSPITAL_COMMUNITY): Payer: Self-pay

## 2021-11-15 DIAGNOSIS — N898 Other specified noninflammatory disorders of vagina: Secondary | ICD-10-CM

## 2021-11-15 MED ORDER — CEFTRIAXONE SODIUM 500 MG IJ SOLR
500.0000 mg | Freq: Once | INTRAMUSCULAR | Status: AC
  Administered 2021-11-15: 500 mg via INTRAMUSCULAR

## 2021-11-15 MED ORDER — CEFTRIAXONE SODIUM 500 MG IJ SOLR
INTRAMUSCULAR | Status: AC
  Filled 2021-11-15: qty 500

## 2021-11-15 MED ORDER — DOXYCYCLINE HYCLATE 100 MG PO CAPS: 100.0000 mg | ORAL_CAPSULE | Freq: Two times a day (BID) | ORAL | 0 refills | Status: DC

## 2021-11-15 MED ORDER — LIDOCAINE HCL (PF) 1 % IJ SOLN
INTRAMUSCULAR | Status: AC
  Filled 2021-11-15: qty 2

## 2021-11-15 NOTE — ED Triage Notes (Signed)
Pt presents with vaginal discharge, itching and burning X 1 week; pt requesting STD Testing.

## 2021-11-15 NOTE — Discharge Instructions (Signed)

## 2021-11-15 NOTE — Telephone Encounter (Signed)
Patient LVM asking for results.  Saw telephone encounter by RN Diane and patient needs to recollect.   Attempted to reach patient x 2, LVM

## 2021-11-15 NOTE — ED Provider Notes (Signed)
[facility]   JP:[id number] 11/15/21 Arrival Time: 0901  ASSESSMENT & PLAN:  1. Vaginal discharge       Discharge Instructions      You have been given the following today for treatment of suspected gonorrhea and/or chlamydia:  cefTRIAXone (ROCEPHIN) injection 500 mg  Please pick up your prescription for doxycycline 100 mg and begin taking twice daily for the next seven (7) days.  Even though we have treated you today, we have sent testing for sexually transmitted infections. We will notify you of any positive results once they are received. If required, we will prescribe any medications you might need.  Please refrain from all sexual activity for at least the next seven days.     Without signs of PID.  Labs Reviewed  CERVICOVAGINAL ANCILLARY ONLY   Will notify of any positive results. Instructed to refrain from sexual activity for at least seven days.  Reviewed expectations re: course of current medical issues. Questions answered. Outlined signs and symptoms indicating need for more acute intervention. Patient verbalized understanding. After Visit Summary given.   SUBJECTIVE:  Jennifer Leach is a 41 y.o. female who presents with complaint of vaginal discharge. Onset gradual. First noticed  sev d ago . Describes discharge as thick and opaque and green; without odor. No specific aggravating or alleviating factors reported. Denies: urinary frequency, dysuria, and gross hematuria. Afebrile. No abdominal or pelvic pain. Normal PO intake wihout n/v. No genital rashes or lesions. Reports that she is sexually active with single female partner. OTC treatment: none. History of STI: treated gonorrhea.  Patient's last menstrual period was 10/25/2021.   OBJECTIVE:  Vitals:   11/15/21 1040  BP: 140/87  Pulse: 71  Resp: 17  Temp: 97.8 F (36.6 C)  TempSrc: Oral  SpO2: 100%     General appearance: alert, cooperative, appears stated age and no distress Lungs:  unlabored respirations; speaks full sentences without difficulty Back: no CVA tenderness; FROM at waist Abdomen: soft, non-tender GU: deferred Skin: warm and dry Psychological: alert and cooperative; normal mood and affect.   Labs Reviewed  CERVICOVAGINAL ANCILLARY ONLY    Allergies  Allergen Reactions   Septra Ds [Sulfamethoxazole-Trimethoprim] Anaphylaxis, Swelling, Rash and Other (See Comments)    Reaction:  Facial swelling     Past Medical History:  Diagnosis Date   Bacterial vaginitis 03/04/2017   Gonorrhea    Labial swelling 03/04/2017   MRSA (methicillin resistant staph aureus) culture positive 08/06/2014   Family History  Problem Relation Age of Onset   Hypertension Mother    Social History   Socioeconomic History   Marital status: Single    Spouse name: Not on file   Number of children: Not on file   Years of education: Not on file   Highest education level: Not on file  Occupational History   Not on file  Tobacco Use   Smoking status: Every Day    Packs/day: 0.25    Years: 9.00    Pack years: 2.25    Types: Cigarettes    Last attempt to quit: 06/09/2012    Years since quitting: 9.4   Smokeless tobacco: Never   Tobacco comments:    pt reports is is smoking   Vaping Use   Vaping Use: Never used  Substance and Sexual Activity   Alcohol use: No   Drug use: No   Sexual activity: Yes    Partners: Male    Birth control/protection: Surgical  Other Topics Concern  Not on file  Social History Narrative   Not on file   Social Determinants of Health   Financial Resource Strain: Not on file  Food Insecurity: Not on file  Transportation Needs: Not on file  Physical Activity: Not on file  Stress: Not on file  Social Connections: Not on file  Intimate Partner Violence: Not on file           Vanessa Kick, MD 11/15/21 1259

## 2021-11-16 ENCOUNTER — Ambulatory Visit (HOSPITAL_COMMUNITY)
Admission: EM | Admit: 2021-11-16 | Discharge: 2021-11-16 | Disposition: A | Discharge status: Home / Self Care | Payer: Medicaid Other | Source: Ambulatory Visit | Attending: Internal Medicine | Admitting: Internal Medicine

## 2021-11-16 ENCOUNTER — Other Ambulatory Visit: Payer: Self-pay

## 2021-11-16 DIAGNOSIS — Z202 Contact with and (suspected) exposure to infections with a predominantly sexual mode of transmission: Secondary | ICD-10-CM

## 2021-11-16 DIAGNOSIS — N898 Other specified noninflammatory disorders of vagina: Secondary | ICD-10-CM | POA: Diagnosis not present

## 2021-11-16 NOTE — ED Notes (Signed)
Pt came back self re-swab.

## 2021-11-16 NOTE — ED Triage Notes (Signed)
Pt presents to [facility] for repeat STI testing.

## 2021-11-17 ENCOUNTER — Telehealth (HOSPITAL_COMMUNITY): Payer: Self-pay | Admitting: Emergency Medicine

## 2021-11-17 LAB — CERVICOVAGINAL ANCILLARY ONLY
Bacterial Vaginitis (gardnerella): POSITIVE — AB
Candida Glabrata: NEGATIVE
Candida Vaginitis: POSITIVE — AB
Chlamydia: NEGATIVE
Comment: NEGATIVE
Comment: NEGATIVE
Comment: NEGATIVE
Comment: NEGATIVE
Comment: NEGATIVE
Comment: NORMAL
Neisseria Gonorrhea: NEGATIVE
Trichomonas: NEGATIVE

## 2021-11-17 MED ORDER — FLUCONAZOLE 150 MG PO TABS: 150.0000 mg | ORAL_TABLET | Freq: Once | ORAL | 0 refills | Status: AC

## 2021-11-21 ENCOUNTER — Ambulatory Visit: Payer: Medicaid Other | Admitting: Obstetrics and Gynecology

## 2021-12-28 ENCOUNTER — Emergency Department (HOSPITAL_COMMUNITY): Payer: Medicaid Other

## 2021-12-28 ENCOUNTER — Other Ambulatory Visit: Payer: Self-pay

## 2021-12-28 ENCOUNTER — Encounter (HOSPITAL_COMMUNITY): Payer: Self-pay

## 2021-12-28 ENCOUNTER — Emergency Department (HOSPITAL_COMMUNITY)
Admission: EM | Admit: 2021-12-28 | Discharge: 2021-12-29 | Disposition: A | Discharge status: Home / Self Care | Payer: Medicaid Other | Attending: Emergency Medicine | Admitting: Emergency Medicine

## 2021-12-28 DIAGNOSIS — R42 Dizziness and giddiness: Secondary | ICD-10-CM | POA: Diagnosis not present

## 2021-12-28 DIAGNOSIS — R7309 Other abnormal glucose: Secondary | ICD-10-CM | POA: Insufficient documentation

## 2021-12-28 DIAGNOSIS — R0602 Shortness of breath: Secondary | ICD-10-CM | POA: Insufficient documentation

## 2021-12-28 DIAGNOSIS — R002 Palpitations: Secondary | ICD-10-CM | POA: Diagnosis present

## 2021-12-28 LAB — I-STAT BETA HCG BLOOD, ED (MC, WL, AP ONLY): I-stat hCG, quantitative: <5 m[IU]/mL (ref <5)

## 2021-12-28 LAB — BASIC METABOLIC PANEL
Anion gap: 8 (ref 5–15)
BUN: 10 mg/dL (ref 6–20)
CO2: 22 mmol/L (ref 22–32)
Calcium: 8.6 mg/dL — ABNORMAL LOW (ref 8.9–10.3)
Chloride: 107 mmol/L (ref 98–111)
Creatinine, Ser: 0.78 mg/dL (ref 0.44–1.00)
GFR, Estimated: >60 mL/min (ref >60)
Glucose, Bld: 109 mg/dL — ABNORMAL HIGH (ref 70–99)
Potassium: 3.5 mmol/L (ref 3.5–5.1)
Sodium: 137 mmol/L (ref 135–145)

## 2021-12-28 LAB — CBC
HCT: 38.1 % (ref 36.0–46.0)
Hemoglobin: 12.9 g/dL (ref 12.0–15.0)
MCH: 31.1 pg (ref 26.0–34.0)
MCHC: 33.9 g/dL (ref 30.0–36.0)
MCV: 91.8 fL (ref 80.0–100.0)
Platelets: 230 10*3/uL (ref 150–400)
RBC: 4.15 MIL/uL (ref 3.87–5.11)
RDW: 12.7 % (ref 11.5–15.5)
WBC: 8.4 10*3/uL (ref 4.0–10.5)
nRBC: 0 % (ref 0.0–0.2)

## 2021-12-28 LAB — TSH: TSH: 1.551 u[IU]/mL (ref 0.350–4.500)

## 2021-12-28 NOTE — ED Provider Triage Note (Signed)
Emergency Medicine Provider Triage Evaluation Note ? ?Jennifer Leach , a 41 y.o. female  was evaluated in triage.  Pt complains of tachypalpitations and shortness of breath.  Patient has had 2 episodes over the past 3 hours.  Symptoms occurred while she was cooking and at rest.  She had lightheadedness but no syncope.  No chest pain.  Patient was seen by her sister who is a tach and had her blood pressure check.  She was given a pill for anxiety and felt better.  Symptoms returned, prompting emergency department visit.  No definite history of atrial fibrillation or SVT, but she states that she could have been told that she had an irregular heartbeat in the past.  Denies drugs or alcohol.  Uses caffeine sparingly. ? ?Review of Systems  ?Positive: Shortness of breath, palpitations ?Negative: Chest pain ? ?Physical Exam  ?BP 137/85   Pulse 98   Resp 16   Ht 5\' 6"  (1.676 m)   Wt 76.2 kg   LMP 12/28/2021 (Approximate)   SpO2 100%   BMI 27.12 kg/m?  ?Gen:   Awake, no distress   ?Resp:  Normal effort  ?MSK:   Moves extremities without difficulty  ?Other:  No murmurs rubs or gallops heard on cardiac exam, patient mildly anxious ? ?Medical Decision Making  ?Medically screening exam initiated at 10:17 PM.  Appropriate orders placed.  Jennifer Leach was informed that the remainder of the evaluation will be completed by another provider, this initial triage assessment does not replace that evaluation, and the importance of remaining in the ED until their evaluation is complete. ? ? ?  ?Jennifer Rea, PA-C ?12/28/21 2218 ? ?

## 2021-12-28 NOTE — ED Triage Notes (Signed)
Pt reports she was cooking dinner tonight and had an episode of what felt like her heart was racing/beating fast associated with SOB and hand tremors. She reports her sister gave her a pill to help calm her down in case it was an anxiety attack but she cant remember the name of it.  ?

## 2021-12-29 NOTE — ED Notes (Signed)
Pt currently reporting no SOB. R even and unlabored ?

## 2021-12-29 NOTE — ED Provider Notes (Signed)
?[facility] EMERGENCY DEPARTMENT ?Provider Note ? ? ?CSN: [id number] ?Arrival date & time: 12/28/21  2203 ? ?  ? ?History ? ?Chief Complaint  ?Patient presents with  ? Shortness of Breath  ? ? ?Jennifer Leach is a 41 y.o. female. ? ?Patient seen by myself earlier in triage -- pt complains of tachypalpitations and shortness of breath.  Patient has had 2 episodes over the past 3 hours.  Symptoms occurred while she was cooking and then at rest.  She had lightheadedness but no syncope.  No chest pain.  Patient reports having a tight sensation in her right arm over the past month intermittently that is not associated with activity and is improved when she stretches her arm.  Patient was seen by her sister who is a Psychologist, sport and exercise and had her blood pressure check.  She was given a pill for anxiety (10mg , unknown name) and felt better for a little while.  Symptoms returned, prompting emergency department visit.  During the episode she reports rapid breathing and fast heart rate.  No definite history of atrial fibrillation or SVT, but she states that she could have been told that she had an irregular heartbeat in the past.  Denies drugs including cocaine, or alcohol.  Uses caffeine sparingly, last used this morning.  No history of hypertension, diabetes, high cholesterol.  She does not smoke  Patient denies risk factors for pulmonary embolism including: unilateral leg swelling, history of DVT/PE/other blood clots, use of exogenous hormones, recent immobilizations, recent surgery, recent travel (>4hr segment), malignancy, hemoptysis.  ? ? ? ? ?  ? ?Home Medications ?Prior to Admission medications   ?Medication Sig Start Date End Date Taking? Authorizing Provider  ?cetirizine (ZYRTEC ALLERGY) 10 MG tablet Take 1 tablet (10 mg total) by mouth daily. ?Patient not taking: Reported on 04/14/2020 04/06/20   04/08/20, PA-C  ?doxycycline (VIBRAMYCIN) 100 MG capsule Take 1 capsule (100 mg total) by mouth 2 (two) times  daily. 11/15/21   01/13/22, MD  ?ibuprofen (ADVIL) 800 MG tablet Take 1 tablet (800 mg total) by mouth every 8 (eight) hours as needed. 09/07/21   09/09/21, MD  ?metroNIDAZOLE (METROGEL VAGINAL) 0.75 % vaginal gel Place 1 Applicatorful vaginally at bedtime. ?Patient not taking: Reported on 09/07/2021 08/09/21   Raspet, 13/1/22, PA-C  ?promethazine (PHENERGAN) 25 MG tablet Take 1 tablet (25 mg total) by mouth every 6 (six) hours as needed for nausea or vomiting. 09/07/21   09/09/21, MD  ?Pseudoeph-Doxylamine-DM-APAP (NYQUIL PO) Take by mouth. ?Patient not taking: Reported on 04/14/2020    [provider]  ?terconazole (TERAZOL 7) 0.4 % vaginal cream Place 1 applicator vaginally at bedtime. ?Patient not taking: Reported on 07/26/2020 06/22/20   06/24/20, MD  ?tranexamic acid (LYSTEDA) 650 MG TABS tablet Take 2 tablets (1,300 mg total) by mouth 3 (three) times daily. Take during menses for a maximum of five days 09/07/21   09/09/21, MD  ?   ? ?Allergies    ?Septra ds [sulfamethoxazole-trimethoprim]   ? ?Review of Systems   ?Review of Systems ? ?Physical Exam ?Updated Vital Signs ?BP 115/80 (BP Location: Right Arm)   Pulse 64   Temp 97.8 ?F (36.6 ?C) (Oral)   Resp 17   Ht 5\' 6"  (1.676 m)   Wt 76.2 kg   LMP 12/28/2021 (Approximate)   SpO2 99%   BMI 27.12 kg/m?  ?Physical Exam ?Vitals and nursing note reviewed.  ?Constitutional:   ?  Appearance: She is well-developed. She is not diaphoretic.  ?HENT:  ?   Head: Normocephalic and atraumatic.  ?   Mouth/Throat:  ?   Mouth: Mucous membranes are not dry.  ?Eyes:  ?   Conjunctiva/sclera: Conjunctivae normal.  ?Neck:  ?   Vascular: Normal carotid pulses. No JVD.  ?   Trachea: Trachea normal. No tracheal deviation.  ?Cardiovascular:  ?   Rate and Rhythm: Normal rate and regular rhythm.  ?   Pulses: No decreased pulses.     ?     Radial pulses are 2+ on the right side and 2+ on the left side.  ?   Heart sounds: Normal heart  sounds, S1 normal and S2 normal. No murmur heard. ?Pulmonary:  ?   Effort: Pulmonary effort is normal. No respiratory distress.  ?   Breath sounds: No wheezing.  ?Chest:  ?   Chest wall: No tenderness.  ?Abdominal:  ?   General: Bowel sounds are normal.  ?   Palpations: Abdomen is soft.  ?   Tenderness: There is no abdominal tenderness. There is no guarding or rebound.  ?Musculoskeletal:     ?   General: Normal range of motion.  ?   Cervical back: Normal range of motion and neck supple. No muscular tenderness.  ?   Right lower leg: No tenderness. No edema.  ?   Left lower leg: No tenderness. No edema.  ?Skin: ?   General: Skin is warm and dry.  ?   Coloration: Skin is not pale.  ?Neurological:  ?   Mental Status: She is alert.  ? ? ?ED Results / Procedures / Treatments   ?Labs ?(all labs ordered are listed, but only abnormal results are displayed) ?Labs Reviewed  ?BASIC METABOLIC PANEL - Abnormal; Notable for the following components:  ?    Result Value  ? Glucose, Bld 109 (*)   ? Calcium 8.6 (*)   ? All other components within normal limits  ?CBC  ?TSH  ?I-STAT BETA HCG BLOOD, ED ([facility], [facility], [facility] ONLY)  ? ? ?ED ECG REPORT ? ? Date: 12/29/2021 ? Rate: 92 ? Rhythm: normal sinus rhythm ? QRS Axis: normal ? Intervals: normal ? ST/T Wave abnormalities: normal ? Conduction Disutrbances:none ? Narrative Interpretation: Septal Q waves present in 2019, unchanged.  No elements of Brugada syndrome, prolonged QT, WPW, heart block ? Old EKG Reviewed: unchanged ? ?I have personally reviewed the EKG tracing and agree with the computerized printout as noted. ? ? ?Radiology ?DG Chest 2 View ? ?Result Date: 12/28/2021 ?CLINICAL DATA:  Palpitations EXAM: CHEST - 2 VIEW COMPARISON:  None. FINDINGS: The heart size and mediastinal contours are within normal limits. Both lungs are clear. The visualized skeletal structures are unremarkable. IMPRESSION: No active cardiopulmonary disease. Electronically Signed   By: Deatra Robinson M.D.   On:  12/28/2021 22:53   ? ?Procedures ?Procedures  ? ? ?Medications Ordered in ED ?Medications - No data to display ? ?ED Course/ Medical Decision Making/ A&P ?  ? ?Patient seen and examined. History obtained directly from patient. Work-up including labs, imaging, EKG ordered in triage, if performed, were reviewed.   ? ?Labs/EKG: Independently reviewed and interpreted.  This included: CBC normal with normal hemoglobin; BMP with normal electrolytes, slightly elevated glucose at 109; TSH normal; pregnancy test negative. ? ?Imaging: Independently visualized and interpreted.  This included: Chest x-ray agree normal ? ?Medications/Fluids: None ordered ? ?Most recent vital signs reviewed and are as follows: ?BP  115/80 (BP Location: Right Arm)   Pulse 64   Temp 97.8 ?F (36.6 ?C) (Oral)   Resp 17   Ht 5\' 6"  (1.676 m)   Wt 76.2 kg   LMP 12/28/2021 (Approximate)   SpO2 99%   BMI 27.12 kg/m?  ? ?Initial impression:  Palpitations ? ?1:28 AM Reassessment performed. Patient appears comfortable.  She states she is feeling a little better. ? ?Reviewed pertinent lab work and imaging with patient at bedside.  We did briefly discuss obtaining a troponin given the fact that she has had right arm symptoms, however these have been present for a month and are atypical and I have low concern for ACS at this time.  She declines further testing and would like to be discharged.  Questions answered.  ? ?Most current vital signs reviewed and are as follows: ?BP 115/80 (BP Location: Right Arm)   Pulse 64   Temp 97.8 ?F (36.6 ?C) (Oral)   Resp 17   Ht 5\' 6"  (1.676 m)   Wt 76.2 kg   LMP 12/28/2021 (Approximate)   SpO2 99%   BMI 27.12 kg/m?  ? ?Plan: Discharge to home.  ? ?Prescriptions written for: None ? ?Other home care instructions discussed: Avoidance of stimulants, decongestants ? ?ED return instructions discussed: Return with persistent palpitations, chest pain, shortness of breath, syncope ? ?Follow-up instructions discussed:  Patient encouraged to follow-up with their PCP in 5 days.  Also provided cardiology referral as she could be a candidate for Holter monitor if symptoms recur or persist. ? ? ?                        ?Medical Decision Making ?Amount and/or

## 2021-12-29 NOTE — Discharge Instructions (Signed)
Please read and follow all provided instructions. ? ?Your diagnoses today include:  ?1. Palpitations   ? ? ?Tests performed today include: ?An EKG of your heart ?A chest x-ray ?Cardiac enzymes - a blood test for heart muscle damage ?Blood counts and electrolytes ?Thyroid testing: Was normal ?Vital signs. See below for your results today.  ? ?Medications prescribed:  ?None ? ?Take any prescribed medications only as directed. ? ?Follow-up instructions: ?Please follow-up with your primary care provider or the cardiologist listed when able for further evaluation of your symptoms.  ? ?Return instructions:  ?SEEK IMMEDIATE MEDICAL ATTENTION IF: ?You have severe chest pain, especially if the pain is crushing or pressure-like and spreads to the arms, back, neck, or jaw, or if you have sweating, nausea or vomiting, or trouble with breathing. THIS IS AN EMERGENCY. Do not wait to see if the pain will go away. Get medical help at once. Call 911. DO NOT drive yourself to the hospital.  ?Your chest pain gets worse and does not go away after a few minutes of rest.  ?You have an attack of chest pain lasting longer than what you usually experience.  ?You have significant dizziness, if you pass out, or have trouble walking.  ?You have chest pain not typical of your usual pain for which you originally saw your caregiver.  ?You have any other emergent concerns regarding your health. ? ?Additional Information: ?Chest pain comes from many different causes. Your caregiver has diagnosed you as having chest pain that is not specific for one problem, but does not require admission.  You are at low risk for an acute heart condition or other serious illness.  ? ?Your vital signs today were: ?BP 115/80 (BP Location: Right Arm)   Pulse 64   Temp 97.8 ?F (36.6 ?C) (Oral)   Resp 17   Ht 5\' 6"  (1.676 m)   Wt 76.2 kg   LMP 12/28/2021 (Approximate)   SpO2 99%   BMI 27.12 kg/m?  ?If your blood pressure (BP) was elevated above 135/85 this  visit, please have this repeated by your doctor within one month. ?-------------- ? ? ?

## 2021-12-29 NOTE — ED Notes (Signed)
Rn reviewed discharge instructions with pt. Pt verbalized understanding and had no further questions. VSS upon discharge 

## 2022-01-04 ENCOUNTER — Ambulatory Visit: Payer: Medicaid Other | Admitting: Cardiology

## 2022-01-30 ENCOUNTER — Ambulatory Visit: Payer: Medicaid Other | Admitting: Cardiology

## 2022-02-27 NOTE — Progress Notes (Unsigned)
New Patient Office Visit  Subjective    Patient ID: Jennifer Leach, female    DOB: Aug 17, 1981  Age: 41 y.o. MRN: [id number]  CC: No chief complaint on file.   HPI Jennifer Leach presents to establish care Tdap   Outpatient Encounter Medications as of 02/28/2022  Medication Sig  . cetirizine (ZYRTEC ALLERGY) 10 MG tablet Take 1 tablet (10 mg total) by mouth daily. (Patient not taking: Reported on 04/14/2020)  . doxycycline (VIBRAMYCIN) 100 MG capsule Take 1 capsule (100 mg total) by mouth 2 (two) times daily.  Marland Kitchen ibuprofen (ADVIL) 800 MG tablet Take 1 tablet (800 mg total) by mouth every 8 (eight) hours as needed.  . metroNIDAZOLE (METROGEL VAGINAL) 0.75 % vaginal gel Place 1 Applicatorful vaginally at bedtime. (Patient not taking: Reported on 09/07/2021)  . promethazine (PHENERGAN) 25 MG tablet Take 1 tablet (25 mg total) by mouth every 6 (six) hours as needed for nausea or vomiting.  . Pseudoeph-Doxylamine-DM-APAP (NYQUIL PO) Take by mouth. (Patient not taking: Reported on 04/14/2020)  . terconazole (TERAZOL 7) 0.4 % vaginal cream Place 1 applicator vaginally at bedtime. (Patient not taking: Reported on 07/26/2020)  . tranexamic acid (LYSTEDA) 650 MG TABS tablet Take 2 tablets (1,300 mg total) by mouth 3 (three) times daily. Take during menses for a maximum of five days   No facility-administered encounter medications on file as of 02/28/2022.    Past Medical History:  Diagnosis Date  . Bacterial vaginitis 03/04/2017  . Gonorrhea   . Labial swelling 03/04/2017  . MRSA (methicillin resistant staph aureus) culture positive 08/06/2014    Past Surgical History:  Procedure Laterality Date  . NO PAST SURGERIES    . TUBAL LIGATION N/A 02/19/2017   Procedure: POST PARTUM TUBAL LIGATION;  Surgeon: [name], MD;  Location: [facility] BIRTHING SUITES;  Service: Gynecology;  Laterality: N/A;  . WISDOM TOOTH EXTRACTION      Family History  Problem Relation Age of Onset  . Hypertension  Mother     Social History   Socioeconomic History  . Marital status: Single    Spouse name: Not on file  . Number of children: Not on file  . Years of education: Not on file  . Highest education level: Not on file  Occupational History  . Not on file  Tobacco Use  . Smoking status: Every Day    Packs/day: 0.25    Years: 9.00    Pack years: 2.25    Types: Cigarettes    Last attempt to quit: 06/09/2012    Years since quitting: 9.7  . Smokeless tobacco: Never  . Tobacco comments:    pt reports is is smoking   Vaping Use  . Vaping Use: Never used  Substance and Sexual Activity  . Alcohol use: No  . Drug use: No  . Sexual activity: Yes    Partners: Male    Birth control/protection: Surgical  Other Topics Concern  . Not on file  Social History Narrative  . Not on file   Social Determinants of Health   Financial Resource Strain: Not on file  Food Insecurity: Not on file  Transportation Needs: Not on file  Physical Activity: Not on file  Stress: Not on file  Social Connections: Not on file  Intimate Partner Violence: Not on file    ROS      Objective    There were no vitals taken for this visit.  Physical Exam  {Labs (Optional):23779}  Assessment & Plan:   Problem List Items Addressed This Visit   None   No follow-ups on file.   Shan Levans, MD

## 2022-02-28 ENCOUNTER — Ambulatory Visit: Payer: Medicaid Other | Admitting: Critical Care Medicine

## 2022-04-12 ENCOUNTER — Emergency Department (HOSPITAL_COMMUNITY): Payer: Medicaid Other

## 2022-04-12 ENCOUNTER — Emergency Department (HOSPITAL_COMMUNITY)
Admission: EM | Admit: 2022-04-12 | Discharge: 2022-04-13 | Disposition: A | Discharge status: Home / Self Care | Payer: Medicaid Other | Attending: Emergency Medicine | Admitting: Emergency Medicine

## 2022-04-12 ENCOUNTER — Encounter (HOSPITAL_COMMUNITY): Payer: Self-pay | Admitting: Emergency Medicine

## 2022-04-12 DIAGNOSIS — R002 Palpitations: Secondary | ICD-10-CM | POA: Insufficient documentation

## 2022-04-12 DIAGNOSIS — N9489 Other specified conditions associated with female genital organs and menstrual cycle: Secondary | ICD-10-CM | POA: Diagnosis not present

## 2022-04-12 DIAGNOSIS — H938X1 Other specified disorders of right ear: Secondary | ICD-10-CM | POA: Diagnosis not present

## 2022-04-12 LAB — CBC WITH DIFFERENTIAL/PLATELET
Abs Immature Granulocytes: 0.01 10*3/uL (ref 0.00–0.07)
Basophils Absolute: 0 10*3/uL (ref 0.0–0.1)
Basophils Relative: 0 %
Eosinophils Absolute: 0.2 10*3/uL (ref 0.0–0.5)
Eosinophils Relative: 3 %
HCT: 39.4 % (ref 36.0–46.0)
Hemoglobin: 12.7 g/dL (ref 12.0–15.0)
Immature Granulocytes: 0 %
Lymphocytes Relative: 43 %
Lymphs Abs: 3 10*3/uL (ref 0.7–4.0)
MCH: 30.3 pg (ref 26.0–34.0)
MCHC: 32.2 g/dL (ref 30.0–36.0)
MCV: 94 fL (ref 80.0–100.0)
Monocytes Absolute: 0.4 10*3/uL (ref 0.1–1.0)
Monocytes Relative: 6 %
Neutro Abs: 3.3 10*3/uL (ref 1.7–7.7)
Neutrophils Relative %: 48 %
Platelets: 216 10*3/uL (ref 150–400)
RBC: 4.19 MIL/uL (ref 3.87–5.11)
RDW: 12.4 % (ref 11.5–15.5)
WBC: 6.8 10*3/uL (ref 4.0–10.5)
nRBC: 0 % (ref 0.0–0.2)

## 2022-04-12 LAB — I-STAT BETA HCG BLOOD, ED (MC, WL, AP ONLY): I-stat hCG, quantitative: <5 m[IU]/mL (ref <5)

## 2022-04-12 LAB — BASIC METABOLIC PANEL
Anion gap: 8 (ref 5–15)
BUN: 16 mg/dL (ref 6–20)
CO2: 20 mmol/L — ABNORMAL LOW (ref 22–32)
Calcium: 8.6 mg/dL — ABNORMAL LOW (ref 8.9–10.3)
Chloride: 109 mmol/L (ref 98–111)
Creatinine, Ser: 0.84 mg/dL (ref 0.44–1.00)
GFR, Estimated: >60 mL/min (ref >60)
Glucose, Bld: 98 mg/dL (ref 70–99)
Potassium: 3.8 mmol/L (ref 3.5–5.1)
Sodium: 137 mmol/L (ref 135–145)

## 2022-04-12 LAB — TROPONIN I (HIGH SENSITIVITY): Troponin I (High Sensitivity): 4 ng/L (ref <18)

## 2022-04-12 NOTE — ED Provider Triage Note (Signed)
Emergency Medicine Provider Triage Evaluation Note  Jennifer Leach , a 41 y.o. female  was evaluated in triage.  Pt complains of chest pain and palpitations.  Patient reports about 10 minutes prior to arrival she had an episode where she felt like her heart was racing and her hands were shaking after which she felt like she had some pain and tightness in her chest.  She reports this lasted maybe 10 or 15 minutes and then the heart racing seem to resolve but she continues to have some soreness in her chest, no shortness of breath.  No lightheadedness or syncope.  She reports she has had some similar episodes previously.  When asked about anxiety patient reports that she had a lot on her mind before this episode started.  Review of Systems  Positive: Chest pain, palpitations, shaking Negative: Lightheadedness, syncope, abdominal pain, nausea, vomiting, fevers  Physical Exam  BP 114/89   Pulse 76   Temp 98.6 F (37 C)   Resp 16   Ht 5\' 6"  (1.676 m)   Wt 76.2 kg   SpO2 98%   BMI 27.11 kg/m  Gen:   Awake, no distress   Chest:  Normal effort, CTA bilat, RRR MSK:   Moves extremities without difficulty  Other:    Medical Decision Making  Medically screening exam initiated at 10:20 PM.  Appropriate orders placed.  SOLANA COGGIN was informed that the remainder of the evaluation will be completed by another provider, this initial triage assessment does not replace that evaluation, and the importance of remaining in the ED until their evaluation is complete.  Work-up initiated from triage, no current tachycardia or arrhythmia   [name], Dartha Lodge 04/12/22 2237

## 2022-04-12 NOTE — ED Triage Notes (Signed)
Pt reports she felt like her heart was "racing" .   While the racing feeling is gone she continues to have chest pain.

## 2022-04-13 LAB — TROPONIN I (HIGH SENSITIVITY): Troponin I (High Sensitivity): 4 ng/L (ref <18)

## 2022-04-13 MED ORDER — CEPHALEXIN 500 MG PO CAPS: 500.0000 mg | ORAL_CAPSULE | Freq: Two times a day (BID) | ORAL | 0 refills | Status: DC

## 2022-04-13 MED ORDER — CEPHALEXIN 250 MG PO CAPS
500.0000 mg | ORAL_CAPSULE | Freq: Once | ORAL | Status: AC
  Administered 2022-04-13: 500 mg via ORAL
  Filled 2022-04-13: qty 2

## 2022-04-13 NOTE — ED Provider Notes (Signed)
[facility]EM[facility]Y DEPT [facility] Emergency Department Provider Note MRN:  [id number]  Arrival date & time: 04/13/22     Chief Complaint   Chest Pain   History of Present Illness   Jennifer Leach is a 41 y.o. year-old female presents to the ED with chief complaint of palpitations.  She states that she has been having intermittent episodes of palpitations for the past several months.  She states that they felt worse today and more like a fluttering sensation in her chest.  She states she is not having the symptoms now.  She denies any stimulant use except for the occasional cup of coffee.  She states that she does feel stressed from time to time, but denies feeling anxious.  She denies having been evaluated for this before.  History provided by patient.   Review of Systems  Pertinent review of systems noted in HPI.    Physical Exam   Vitals:   04/12/22 2230 04/13/22 0300  BP: 114/89 113/82  Pulse: 76 70  Resp: 16 16  Temp: 98.6 F (37 C) 97.9 F (36.6 C)  SpO2: 98% 97%    CONSTITUTIONAL: Nontoxic-appearing, NAD NEURO:  Alert and oriented x 3, CN 3-12 grossly intact EYES:  eyes equal and reactive ENT/NECK:  Supple, no stridor  CARDIO:  normal rate, regular rhythm, appears well-perfused  PULM:  No respiratory distress, CTAB GI/GU:  non-distended,  MSK/SPINE:  No gross deformities, no edema, moves all extremities  SKIN:  no rash, atraumatic   *Additional and/or pertinent findings included in MDM below  Diagnostic and Interventional Summary    EKG Interpretation  Date/Time:  Wednesday April 12 2022 22:27:04 EDT Ventricular Rate:  77 PR Interval:  172 QRS Duration: 82 QT Interval:  394 QTC Calculation: 445 R Axis:   90 Text Interpretation: Normal sinus rhythm Rightward axis Nonspecific ST and T wave abnormality Abnormal ECG Baseline wander Otherwise no significant change Confirmed by Melene Plan [phone]) on 04/13/2022 5:52:17 AM       Labs Reviewed  BASIC  METABOLIC PANEL - Abnormal; Notable for the following components:      Result Value   CO2 20 (*)    Calcium 8.6 (*)    All other components within normal limits  CBC WITH DIFFERENTIAL/PLATELET  I-STAT BETA HCG BLOOD, ED ([facility], [facility], [facility] ONLY)  TROPONIN I (HIGH SENSITIVITY)  TROPONIN I (HIGH SENSITIVITY)    DG Chest 2 View  Final Result      Medications  cephALEXin (KEFLEX) capsule 500 mg (has no administration in time range)     Procedures  /  Critical Care Procedures  ED Course and Medical Decision Making  I have reviewed the triage vital signs, the nursing notes, and pertinent available records from the EMR.  Social Determinants Affecting Complexity of Care: Patient has no clinically significant social determinants affecting this chief complaint..   ED Course:   Patient here with palpitations.  Top differential diagnoses include dysrhythmia, electrolyte derangement, anxiety, stress. Medical Decision Making Patient here with palpitations times several months.  States the symptoms worsened today.  She is not exhibiting any symptoms now.  She denies having had any chest pain.  Laboratory work-up as below is reassuring.  Doubt ACS.  Patient not hypoxic, not tachycardic, I doubt PE.  She has not had any chest pain.  She does have some mild swelling and erythema behind her right ear, which is a different problem entirely, and might be consistent with an early cellulitis or folliculitis.  Tympanic membrane is clear, normal ear canal.  Will cover with Keflex.  Problems Addressed: Palpitations: acute illness or injury  Amount and/or Complexity of Data Reviewed Labs: ordered.    Details: No leukocytosis, no significant electrolyte derangement, pregnancy test negative, troponins are negative Radiology: independent interpretation performed.    Details: No opacity seen on chest x-ray ECG/medicine tests: independent interpretation performed.    Details: Nonspecific ST and T wave  changes  Risk Prescription drug management.     Consultants: No consultations were needed in caring for this patient.   Treatment and Plan: Emergency department workup does not suggest an emergent condition requiring admission or immediate intervention beyond  what has been performed at this time. The patient is safe for discharge and has  been instructed to return immediately for worsening symptoms, change in  symptoms or any other concerns    Final Clinical Impressions(s) / ED Diagnoses     ICD-10-CM   1. Palpitations  R00.2 Ambulatory referral to Cardiology      ED Discharge Orders          Ordered    cephALEXin (KEFLEX) 500 MG capsule  2 times daily        04/13/22 0555    Ambulatory referral to Cardiology        04/13/22 0555              Discharge Instructions Discussed with and Provided to Patient:   Discharge Instructions   None      Roxy Horseman, PA-C 04/13/22 3419    Melene Plan, DO 04/13/22 0630

## 2022-04-20 ENCOUNTER — Ambulatory Visit: Payer: Medicaid Other | Admitting: Critical Care Medicine

## 2022-05-04 ENCOUNTER — Other Ambulatory Visit (HOSPITAL_COMMUNITY)
Admission: RE | Admit: 2022-05-04 | Discharge: 2022-05-04 | Disposition: A | Discharge status: Home / Self Care | Payer: Medicaid Other | Source: Ambulatory Visit | Attending: Obstetrics and Gynecology | Admitting: Obstetrics and Gynecology

## 2022-05-04 ENCOUNTER — Ambulatory Visit (INDEPENDENT_AMBULATORY_CARE_PROVIDER_SITE_OTHER): Payer: Medicaid Other | Admitting: *Deleted

## 2022-05-04 DIAGNOSIS — N898 Other specified noninflammatory disorders of vagina: Secondary | ICD-10-CM

## 2022-05-04 DIAGNOSIS — Z113 Encounter for screening for infections with a predominantly sexual mode of transmission: Secondary | ICD-10-CM

## 2022-05-04 MED ORDER — METRONIDAZOLE 0.75 % VA GEL: 1.0000 | Freq: Every day | VAGINAL | 0 refills | Status: DC

## 2022-05-04 NOTE — Progress Notes (Signed)
SUBJECTIVE:  41 y.o. female complains of vaginal discharge, possible BV. Denies abnormal vaginal bleeding or significant pelvic pain or fever. No UTI symptoms. Denies history of known exposure to STD.   OBJECTIVE:  She appears well, afebrile. Urine dipstick: not done.  ASSESSMENT:  Vaginal Discharge  Vaginal Odor STD screening   PLAN:  GC, chlamydia, trichomonas, BVAG, CVAG probe sent to lab per pt request. Metrogel ordered today.

## 2022-05-05 LAB — CERVICOVAGINAL ANCILLARY ONLY
Bacterial Vaginitis (gardnerella): POSITIVE — AB
Candida Glabrata: NEGATIVE
Candida Vaginitis: NEGATIVE
Chlamydia: NEGATIVE
Comment: NEGATIVE
Comment: NEGATIVE
Comment: NEGATIVE
Comment: NEGATIVE
Comment: NEGATIVE
Comment: NORMAL
Neisseria Gonorrhea: NEGATIVE
Trichomonas: NEGATIVE

## 2022-05-07 NOTE — Progress Notes (Deleted)
Cardiology Office Note:    Date:  05/07/2022   ID:  Jennifer Leach, DOB [birth year], MRN [id number]  PCP:  Brock Bad, MD  Cardiologist:  None   Referring MD: Brock Bad, MD   No chief complaint on file.   History of Present Illness:    Jennifer Leach is a 41 y.o. female with no hx of heart disease who is referred for palpitations.   ER visits 12/28/2021 and 04/12/2022 for palpitations with negative w/u.   Past Medical History:  Diagnosis Date   Bacterial vaginitis 03/04/2017   Gonorrhea    Labial swelling 03/04/2017   MRSA (methicillin resistant staph aureus) culture positive 08/06/2014    Past Surgical History:  Procedure Laterality Date   NO PAST SURGERIES     TUBAL LIGATION N/A 02/19/2017   Procedure: POST PARTUM TUBAL LIGATION;  Surgeon: [name], MD;  Location: [facility];  Service: Gynecology;  Laterality: N/A;   WISDOM TOOTH EXTRACTION      Current Medications: No outpatient medications have been marked as taking for the 05/08/22 encounter (Appointment) with Lyn Records, MD.     Allergies:   Septra ds [sulfamethoxazole-trimethoprim]   Social History   Socioeconomic History   Marital status: Single    Spouse name: Not on file   Number of children: Not on file   Years of education: Not on file   Highest education level: Not on file  Occupational History   Not on file  Tobacco Use   Smoking status: Every Day    Packs/day: 0.25    Years: 9.00    Total pack years: 2.25    Types: Cigarettes    Last attempt to quit: 06/09/2012    Years since quitting: 9.9   Smokeless tobacco: Never   Tobacco comments:    pt reports is is smoking   Vaping Use   Vaping Use: Never used  Substance and Sexual Activity   Alcohol use: No   Drug use: No   Sexual activity: Yes    Partners: Male    Birth control/protection: Surgical  Other Topics Concern   Not on file  Social History Narrative   Not on file   Social Determinants of Health    Financial Resource Strain: Not on file  Food Insecurity: Not on file  Transportation Needs: Not on file  Physical Activity: Not on file  Stress: Not on file  Social Connections: Not on file     Family History: The patient's family history includes Hypertension in her mother.  ROS:   Please see the history of present illness.    *** All other systems reviewed and are negative.  EKGs/Labs/Other Studies Reviewed:    The following studies were reviewed today:  CORONARY CTA with Morphology 2013: IMPRESSION:  1. No evidence of significant coronary artery atherosclerosis.  The  patient's total coronary artery calcium score is 0.  2.  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  These findings are of no  clinical significance at this time, but the position of this left  anterior descending coronary artery should be noted if the patient  should require a right ventricular pacemaker lead placement at any  point in the future.  3.  No acute findings in the visualized thorax to account for the  patient's symptoms.  4.  Codominance of  the coronary arteries.  EKG:  EKG on 04/13/2022 with NSR, RAD, QS V1-2 and NSSTTWA. ***  Recent Labs: 12/28/2021: TSH 1.551 04/12/2022: BUN 16; Creatinine, Ser 0.84; Hemoglobin 12.7; Platelets 216; Potassium 3.8; Sodium 137  Recent Lipid Panel No results found for: "CHOL", "TRIG", "HDL", "CHOLHDL", "VLDL", "LDLCALC", "LDLDIRECT"  Physical Exam:    VS:  There were no vitals taken for this visit.    Wt Readings from Last 3 Encounters:  04/12/22 167 lb 15.9 oz (76.2 kg)  12/28/21 168 lb (76.2 kg)  09/07/21 176 lb 1.6 oz (79.9 kg)     GEN: ***. No acute distress HEENT: Normal NECK: No JVD. LYMPHATICS: No lymphadenopathy CARDIAC: *** murmur. RRR *** gallop, or edema. VASCULAR: *** Normal Pulses. No bruits. RESPIRATORY:  Clear  to auscultation without rales, wheezing or rhonchi  ABDOMEN: Soft, non-tender, non-distended, No pulsatile mass, MUSCULOSKELETAL: No deformity  SKIN: Warm and dry NEUROLOGIC:  Alert and oriented x 3 PSYCHIATRIC:  Normal affect   ASSESSMENT:    1. Palpitations    PLAN:    In order of problems listed above:  ***   Medication Adjustments/Labs and Tests Ordered: Current medicines are reviewed at length with the patient today.  Concerns regarding medicines are outlined above.  No orders of the defined types were placed in this encounter.  No orders of the defined types were placed in this encounter.   There are no Patient Instructions on file for this visit.   Signed, Lesleigh Noe, MD  05/07/2022 7:29 PM    [facility]

## 2022-05-08 ENCOUNTER — Ambulatory Visit: Payer: Medicaid Other | Admitting: Interventional Cardiology

## 2022-05-08 ENCOUNTER — Other Ambulatory Visit: Payer: Self-pay | Admitting: *Deleted

## 2022-05-08 ENCOUNTER — Telehealth: Payer: Self-pay

## 2022-05-08 DIAGNOSIS — N939 Abnormal uterine and vaginal bleeding, unspecified: Secondary | ICD-10-CM

## 2022-05-08 DIAGNOSIS — R002 Palpitations: Secondary | ICD-10-CM

## 2022-05-08 MED ORDER — TRANEXAMIC ACID 650 MG PO TABS: 1300.0000 mg | ORAL_TABLET | Freq: Three times a day (TID) | ORAL | 5 refills | Status: DC

## 2022-05-08 NOTE — Progress Notes (Signed)
Lysteda refilled per Dr Alysia Penna approval.

## 2022-05-08 NOTE — Telephone Encounter (Signed)
-----   Message from [name], MD sent at 05/06/2022  4:50 PM EDT ----- Please send in Rx for BV as per protocol. Please let pt know.  Thanks  Jennifer Leach

## 2022-05-08 NOTE — Telephone Encounter (Signed)
I connected with  Jennifer Leach on 05/08/22 by telephone and verified that I am speaking with the correct person using two identifiers.   Pt aware of results and has already been treated.

## 2022-06-04 ENCOUNTER — Ambulatory Visit (HOSPITAL_COMMUNITY)
Admission: EM | Admit: 2022-06-04 | Discharge: 2022-06-04 | Disposition: A | Discharge status: Home / Self Care | Payer: Medicaid Other | Attending: Family Medicine | Admitting: Family Medicine

## 2022-06-04 ENCOUNTER — Encounter (HOSPITAL_COMMUNITY): Payer: Self-pay | Admitting: Emergency Medicine

## 2022-06-04 ENCOUNTER — Other Ambulatory Visit: Payer: Self-pay

## 2022-06-04 DIAGNOSIS — N760 Acute vaginitis: Secondary | ICD-10-CM | POA: Insufficient documentation

## 2022-06-04 LAB — POCT URINALYSIS DIPSTICK, ED / UC
Bilirubin Urine: NEGATIVE
Glucose, UA: NEGATIVE mg/dL
Hgb urine dipstick: NEGATIVE
Ketones, ur: NEGATIVE mg/dL
Leukocytes,Ua: NEGATIVE
Nitrite: NEGATIVE
Protein, ur: NEGATIVE mg/dL
Specific Gravity, Urine: 1.02 (ref 1.005–1.030)
Urobilinogen, UA: 2 mg/dL — ABNORMAL HIGH (ref 0.0–1.0)
pH: 7 (ref 5.0–8.0)

## 2022-06-04 LAB — POC URINE PREG, ED: Preg Test, Ur: NEGATIVE

## 2022-06-04 MED ORDER — CEFTRIAXONE SODIUM 500 MG IJ SOLR
INTRAMUSCULAR | Status: AC
  Filled 2022-06-04: qty 500

## 2022-06-04 MED ORDER — LIDOCAINE HCL (PF) 1 % IJ SOLN
INTRAMUSCULAR | Status: AC
  Filled 2022-06-04: qty 2

## 2022-06-04 MED ORDER — CEFTRIAXONE SODIUM 500 MG IJ SOLR
500.0000 mg | Freq: Once | INTRAMUSCULAR | Status: AC
  Administered 2022-06-04: 500 mg via INTRAMUSCULAR

## 2022-06-04 MED ORDER — DOXYCYCLINE HYCLATE 100 MG PO CAPS: 100.0000 mg | ORAL_CAPSULE | Freq: Two times a day (BID) | ORAL | 0 refills | Status: AC

## 2022-06-04 NOTE — Progress Notes (Deleted)
Cardiology Office Note:    Date:  06/04/2022   ID:  Jennifer Leach, DOB 06/24/1981, MRN [id number]  PCP:  Jennifer Bad, MD  Cardiologist:  None   Referring MD: Jennifer Bad, MD   No chief complaint on file.   History of Present Illness:    Jennifer Leach is a 41 y.o. female with a hx of multiple ER visits for palpitations.  ***  Past Medical History:  Diagnosis Date   Bacterial vaginitis 03/04/2017   Gonorrhea    Labial swelling 03/04/2017   MRSA (methicillin resistant staph aureus) culture positive 08/06/2014    Past Surgical History:  Procedure Laterality Date   NO PAST SURGERIES     TUBAL LIGATION N/A 02/19/2017   Procedure: POST PARTUM TUBAL LIGATION;  Surgeon: Jennifer Staggers, MD;  Location: [facility];  Service: Gynecology;  Laterality: N/A;   WISDOM TOOTH EXTRACTION      Current Medications: No outpatient medications have been marked as taking for the 06/05/22 encounter (Appointment) with Jennifer Records, MD.     Allergies:   Septra ds [sulfamethoxazole-trimethoprim]   Social History   Socioeconomic History   Marital status: Single    Spouse name: Not on file   Number of children: Not on file   Years of education: Not on file   Highest education level: Not on file  Occupational History   Not on file  Tobacco Use   Smoking status: Every Day    Packs/day: 0.25    Years: 9.00    Total pack years: 2.25    Types: Cigarettes    Last attempt to quit: 06/09/2012    Years since quitting: 9.9   Smokeless tobacco: Never   Tobacco comments:    pt reports is is smoking   Vaping Use   Vaping Use: Never used  Substance and Sexual Activity   Alcohol use: No   Drug use: No   Sexual activity: Yes    Partners: Male    Birth control/protection: Surgical  Other Topics Concern   Not on file  Social History Narrative   Not on file   Social Determinants of Health   Financial Resource Strain: Not on file  Food Insecurity: Not on file   Transportation Needs: Not on file  Physical Activity: Not on file  Stress: Not on file  Social Connections: Not on file     Family History: The patient's family history includes Hypertension in her mother.  ROS:   Please see the history of present illness.    *** All other systems reviewed and are negative.  EKGs/Labs/Other Studies Reviewed:    The following studies were reviewed today: ***  EKG:  EKG ***  Recent Labs: 12/28/2021: TSH 1.551 04/12/2022: BUN 16; Creatinine, Ser 0.84; Hemoglobin 12.7; Platelets 216; Potassium 3.8; Sodium 137  Recent Lipid Panel No results found for: "CHOL", "TRIG", "HDL", "CHOLHDL", "VLDL", "LDLCALC", "LDLDIRECT"  Physical Exam:    VS:  LMP 05/23/2022     Wt Readings from Last 3 Encounters:  04/12/22 167 lb 15.9 oz (76.2 kg)  12/28/21 168 lb (76.2 kg)  09/07/21 176 lb 1.6 oz (79.9 kg)     GEN: ***. No acute distress HEENT: Normal NECK: No JVD. LYMPHATICS: No lymphadenopathy CARDIAC: *** murmur. RRR *** gallop, or edema. VASCULAR: *** Normal Pulses. No bruits. RESPIRATORY:  Clear to auscultation without rales, wheezing or rhonchi  ABDOMEN: Soft, non-tender, non-distended, No pulsatile mass, MUSCULOSKELETAL: No deformity  SKIN: Warm and  dry NEUROLOGIC:  Alert and oriented x 3 PSYCHIATRIC:  Normal affect   ASSESSMENT:    1. Palpitations    PLAN:    In order of problems listed above:  ***   Medication Adjustments/Labs and Tests Ordered: Current medicines are reviewed at length with the patient today.  Concerns regarding medicines are outlined above.  No orders of the defined types were placed in this encounter.  No orders of the defined types were placed in this encounter.   There are no Patient Instructions on file for this visit.   Signed, Jennifer Noe, MD  06/04/2022 8:16 PM    [facility]

## 2022-06-04 NOTE — ED Triage Notes (Signed)
Burning in vagina, burning in lower abdomen.  Partner reports burning with urination.  Patient reports her symptoms present for 3 days.

## 2022-06-04 NOTE — Discharge Instructions (Addendum)
Urinalysis was normal Pregnancy test was negative.  You have been given a shot of Rocephin  Take doxycycline 100 mg --1 capsule 2 times daily for 7 days   Staff will notify you if anything is positive on the swab  Refrain from intercourse for 1 to 2 weeks.

## 2022-06-04 NOTE — ED Notes (Signed)
Cyto and Urine specimens in lab

## 2022-06-04 NOTE — ED Provider Notes (Addendum)
[facility]    CSN: [id number] Arrival date & time: 06/04/22  1253      History   Chief Complaint Chief Complaint  Patient presents with   Exposure to STD    HPI Jennifer Leach is a 41 y.o. female.    Exposure to STD   For a 2 or 3-day history of vaginal burning and burning in her pelvis.  She feels this is consistent with STD.  Her partner states he has had some dysuria recently.  He has not been tested yet.  Patient has had a history of chlamydia and gonorrhea.  She would like empiric treatment today.  Last menstrual cycle was August 15  Past Medical History:  Diagnosis Date   Bacterial vaginitis 03/04/2017   Gonorrhea    Labial swelling 03/04/2017   MRSA (methicillin resistant staph aureus) culture positive 08/06/2014    Patient Active Problem List   Diagnosis Date Noted   Breakthrough bleeding on Depo-Provera 04/14/2020   Hidradenitis 04/14/2020   Gonorrhea 11/20/2019   Abnormal uterine bleeding (AUB) 11/19/2018   H/O tubal ligation 03/15/2017   Bacterial vaginitis 03/04/2017   Labial swelling 03/04/2017    Past Surgical History:  Procedure Laterality Date   NO PAST SURGERIES     TUBAL LIGATION N/A 02/19/2017   Procedure: POST PARTUM TUBAL LIGATION;  Surgeon: [name], MD;  Location: [facility];  Service: Gynecology;  Laterality: N/A;   WISDOM TOOTH EXTRACTION      OB History     Gravida  4   Para  4   Term  4   Preterm      AB  0   Living  4      SAB      IAB  0   Ectopic      Multiple  0   Live Births  4            Home Medications    Prior to Admission medications   Medication Sig Start Date End Date Taking? Authorizing Provider  doxycycline (VIBRAMYCIN) 100 MG capsule Take 1 capsule (100 mg total) by mouth 2 (two) times daily for 7 days. 06/04/22 06/11/22 Yes Zenia Resides, MD  Pseudoeph-Doxylamine-DM-APAP (NYQUIL PO) Take by mouth. Patient not taking: Reported on 04/14/2020    [provider]    Family History Family History  Problem Relation Age of Onset   Hypertension Mother     Social History Social History   Tobacco Use   Smoking status: Every Day    Packs/day: 0.25    Years: 9.00    Total pack years: 2.25    Types: Cigarettes    Last attempt to quit: 06/09/2012    Years since quitting: 9.9   Smokeless tobacco: Never   Tobacco comments:    pt reports is is smoking   Vaping Use   Vaping Use: Never used  Substance Use Topics   Alcohol use: No   Drug use: No     Allergies   Septra ds [sulfamethoxazole-trimethoprim]   Review of Systems Review of Systems   Physical Exam Triage Vital Signs ED Triage Vitals  Enc Vitals Group     BP 06/04/22 1349 112/75     Pulse Rate 06/04/22 1349 71     Resp 06/04/22 1349 18     Temp 06/04/22 1349 97.9 F (36.6 C)     Temp Source 06/04/22 1349 Oral     SpO2 06/04/22 1349 98 %  Weight --      Height --      Head Circumference --      Peak Flow --      Pain Score 06/04/22 1346 8     Pain Loc --      Pain Edu? --      Excl. in GC? --    No data found.  Updated Vital Signs BP 112/75 (BP Location: Right Arm)   Pulse 71   Temp 97.9 F (36.6 C) (Oral)   Resp 18   LMP 05/23/2022   SpO2 98%   Visual Acuity Right Eye Distance:   Left Eye Distance:   Bilateral Distance:    Right Eye Near:   Left Eye Near:    Bilateral Near:     Physical Exam Vitals reviewed.  Constitutional:      General: She is not in acute distress.    Appearance: She is not ill-appearing, toxic-appearing or diaphoretic.  HENT:     Mouth/Throat:     Mouth: Mucous membranes are moist.  Cardiovascular:     Rate and Rhythm: Normal rate and regular rhythm.     Heart sounds: No murmur heard. Pulmonary:     Effort: Pulmonary effort is normal.     Breath sounds: Normal breath sounds.  Abdominal:     General: There is no distension.     Palpations: Abdomen is soft.     Tenderness: There is no abdominal  tenderness.  Neurological:     Mental Status: She is alert.      UC Treatments / Results  Labs (all labs ordered are listed, but only abnormal results are displayed) Labs Reviewed  POCT URINALYSIS DIPSTICK, ED / UC - Abnormal; Notable for the following components:      Result Value   Urobilinogen, UA 2.0 (*)    All other components within normal limits  POC URINE PREG, ED  CERVICOVAGINAL ANCILLARY ONLY    EKG   Radiology No results found.  Procedures Procedures (including critical care time)  Medications Ordered in UC Medications  cefTRIAXone (ROCEPHIN) injection 500 mg (has no administration in time range)    Initial Impression / Assessment and Plan / UC Course  I have reviewed the triage vital signs and the nursing notes.  Pertinent labs & imaging results that were available during my care of the patient were reviewed by me and considered in my medical decision making (see chart for details).     UPT is negative and UA is negative.  We will treat empirically with Rocephin and doxycycline.  Staff will notify her of any other positives on the swab. Final Clinical Impressions(s) / UC Diagnoses   Final diagnoses:  Acute vaginitis     Discharge Instructions      Urinalysis was normal Pregnancy test was negative.  You have been given a shot of Rocephin  Take doxycycline 100 mg --1 capsule 2 times daily for 7 days   Staff will notify you if anything is positive on the swab  Refrain from intercourse for 1 to 2 weeks.      ED Prescriptions     Medication Sig Dispense Auth. Provider   doxycycline (VIBRAMYCIN) 100 MG capsule Take 1 capsule (100 mg total) by mouth 2 (two) times daily for 7 days. 14 capsule Marlinda Mike, Janace Aris, MD      PDMP not reviewed this encounter.   Zenia Resides, MD 06/04/22 1427    Zenia Resides, MD 06/04/22 [phone]

## 2022-06-05 ENCOUNTER — Ambulatory Visit: Payer: Medicaid Other | Admitting: Interventional Cardiology

## 2022-06-05 DIAGNOSIS — R002 Palpitations: Secondary | ICD-10-CM

## 2022-06-05 LAB — CERVICOVAGINAL ANCILLARY ONLY
Bacterial Vaginitis (gardnerella): POSITIVE — AB
Candida Glabrata: NEGATIVE
Candida Vaginitis: NEGATIVE
Chlamydia: NEGATIVE
Comment: NEGATIVE
Comment: NEGATIVE
Comment: NEGATIVE
Comment: NEGATIVE
Comment: NEGATIVE
Comment: NORMAL
Neisseria Gonorrhea: NEGATIVE
Trichomonas: NEGATIVE

## 2022-06-06 ENCOUNTER — Telehealth (HOSPITAL_COMMUNITY): Payer: Self-pay | Admitting: Emergency Medicine

## 2022-06-06 MED ORDER — METRONIDAZOLE 0.75 % VA GEL: 1.0000 | Freq: Every day | VAGINAL | 0 refills | Status: AC

## 2022-06-06 MED ORDER — FLUCONAZOLE 150 MG PO TABS: 150.0000 mg | ORAL_TABLET | Freq: Once | ORAL | 0 refills | Status: AC

## 2022-07-06 ENCOUNTER — Telehealth: Payer: Self-pay

## 2022-07-06 NOTE — Telephone Encounter (Signed)
Returned call at requested number [phone], no answer, vm not setup

## 2022-07-06 NOTE — Telephone Encounter (Signed)
Pt called and stated that yesterday she had bleeding and clots after intercourse, last menstrual cycle around the 16th of this month.  Pt states that bleeding continued today but is lighter, would like to be evaluated; transferred to scheduler.

## 2022-07-12 ENCOUNTER — Encounter: Payer: Self-pay | Admitting: Obstetrics and Gynecology

## 2022-07-12 ENCOUNTER — Ambulatory Visit: Payer: Medicaid Other | Admitting: Obstetrics and Gynecology

## 2022-07-12 ENCOUNTER — Other Ambulatory Visit (HOSPITAL_COMMUNITY)
Admission: RE | Admit: 2022-07-12 | Discharge: 2022-07-12 | Disposition: A | Discharge status: Home / Self Care | Payer: Medicaid Other | Source: Ambulatory Visit | Attending: Obstetrics and Gynecology | Admitting: Obstetrics and Gynecology

## 2022-07-12 VITALS — BP 119/74 | HR 72 | Wt 168.0 lb

## 2022-07-12 DIAGNOSIS — Z8742 Personal history of other diseases of the female genital tract: Secondary | ICD-10-CM | POA: Insufficient documentation

## 2022-07-12 DIAGNOSIS — N930 Postcoital and contact bleeding: Secondary | ICD-10-CM

## 2022-07-12 NOTE — Progress Notes (Unsigned)
Obstetrics and Gynecology Visit Return Patient Evaluation  Appointment Date: 07/12/2022  OBGYN Clinic: [facility]  Chief Complaint: bleeding after sex  History of Present Illness:  Jennifer Leach is a 41 y.o. with above CC. Patient had sex six days ago and noted VB for about a day after that, minimal discomfort. LMP 9/15 and she states that she has qmonth periods that last for about a week and no intermenstrual bleeding. No prior h/o bleeding after sex. No condoms or lubricants used with her husband and no GYN s/s at the time.   Review of Systems: as noted in the History of Present Illness.  Patient Active Problem List   Diagnosis Date Noted   Breakthrough bleeding on Depo-Provera 04/14/2020   Hidradenitis 04/14/2020   Gonorrhea 11/20/2019   Abnormal uterine bleeding (AUB) 11/19/2018   H/O tubal ligation 03/15/2017   Bacterial vaginitis 03/04/2017   Labial swelling 03/04/2017   Medications:  Gerald L. Rosner had no medications administered during this visit. Current Outpatient Medications  Medication Sig Dispense Refill   Pseudoeph-Doxylamine-DM-APAP (NYQUIL PO) Take by mouth. (Patient not taking: Reported on 04/14/2020)     No current facility-administered medications for this visit.    Allergies: is allergic to septra ds [sulfamethoxazole-trimethoprim].  Physical Exam:  BP 119/74   Pulse 72   Wt 168 lb (76.2 kg)   LMP 06/23/2022   BMI 27.12 kg/m  Body mass index is 27.12 kg/m. General appearance: Well nourished, well developed female in no acute distress.  Abdomen: diffusely non tender to palpation, non distended, and no masses, hernias Neuro/Psych:  Normal mood and affect.    Pelvic exam:  EGBUS: normal Vaginal vault: normal Cervix:  normal, not friable Bimanual: negative  Assessment: patient stable  Plan:  1. History of vaginal bleeding Unsure etiology. Swab obtained. Recommend expectant management. Can consider u/s if re-occurs - Cervicovaginal ancillary  only( [place])   RTC: PRN  Durene Romans MD Attending Center for [facility])

## 2022-07-12 NOTE — Progress Notes (Unsigned)
Pt had intercourse with bleeding/clots x 1 week ago.  Pt states bleeding lasted a few days.  Pt states she had clots come out while in shower. Pt has had some lower pelvic pain.  Pt has had BTL.

## 2022-07-14 LAB — CERVICOVAGINAL ANCILLARY ONLY
Bacterial Vaginitis (gardnerella): POSITIVE — AB
Candida Glabrata: NEGATIVE
Candida Vaginitis: NEGATIVE
Chlamydia: NEGATIVE
Comment: NEGATIVE
Comment: NEGATIVE
Comment: NEGATIVE
Comment: NEGATIVE
Comment: NEGATIVE
Comment: NORMAL
Neisseria Gonorrhea: NEGATIVE
Trichomonas: NEGATIVE

## 2022-07-15 MED ORDER — METRONIDAZOLE 500 MG PO TABS: 500.0000 mg | ORAL_TABLET | Freq: Two times a day (BID) | ORAL | 0 refills | Status: DC

## 2022-07-15 NOTE — Addendum Note (Signed)
Addended by: Aletha Halim on: 07/15/2022 08:56 AM   Modules accepted: Orders

## 2022-08-21 ENCOUNTER — Other Ambulatory Visit (HOSPITAL_COMMUNITY)
Admission: RE | Admit: 2022-08-21 | Discharge: 2022-08-21 | Disposition: A | Discharge status: Home / Self Care | Payer: Medicaid Other | Source: Ambulatory Visit | Attending: Obstetrics and Gynecology | Admitting: Obstetrics and Gynecology

## 2022-08-21 ENCOUNTER — Ambulatory Visit (INDEPENDENT_AMBULATORY_CARE_PROVIDER_SITE_OTHER): Payer: Medicaid Other | Admitting: General Practice

## 2022-08-21 VITALS — BP 133/87 | HR 77 | Ht 60.0 in | Wt 168.3 lb

## 2022-08-21 DIAGNOSIS — Z113 Encounter for screening for infections with a predominantly sexual mode of transmission: Secondary | ICD-10-CM | POA: Diagnosis present

## 2022-08-21 NOTE — Progress Notes (Signed)
SUBJECTIVE:  41 y.o. female complains of white vaginal discharge, lower abdominal pain, and vaginal burning and discomfort for 3 day(s). Denies abnormal vaginal bleeding or significant pelvic pain or fever. No UTI symptoms. Denies history of known exposure to STD.  Pt also states she has had vaginal bleeding with large clot every time she has intercourse. Bleeding outside of regular periods. Pt states periods last 10 days.  Pt scheduled for AEX 09/19/22. Pt advised to keep appt to discuss bleeding with a provider.   No LMP recorded.  OBJECTIVE:  She appears well, afebrile. Urine dipstick: not done.  ASSESSMENT:  Vaginal Discharge  Vaginal Odor   PLAN:  GC, chlamydia, trichomonas, BVAG, CVAG probe sent to lab. Treatment: To be determined once lab results are received ROV prn if symptoms persist or worsen.

## 2022-08-22 LAB — CERVICOVAGINAL ANCILLARY ONLY
Bacterial Vaginitis (gardnerella): POSITIVE — AB
Candida Glabrata: NEGATIVE
Candida Vaginitis: POSITIVE — AB
Chlamydia: NEGATIVE
Comment: NEGATIVE
Comment: NEGATIVE
Comment: NEGATIVE
Comment: NEGATIVE
Comment: NEGATIVE
Comment: NORMAL
Neisseria Gonorrhea: NEGATIVE
Trichomonas: NEGATIVE

## 2022-08-22 LAB — HEPATITIS B SURFACE ANTIGEN: Hepatitis B Surface Ag: NEGATIVE

## 2022-08-22 LAB — HEPATITIS C ANTIBODY: Hep C Virus Ab: NONREACTIVE

## 2022-08-22 LAB — HIV ANTIBODY (ROUTINE TESTING W REFLEX): HIV Screen 4th Generation wRfx: NONREACTIVE

## 2022-08-22 LAB — RPR: RPR Ser Ql: NONREACTIVE

## 2022-08-22 MED ORDER — METRONIDAZOLE 500 MG PO TABS: 500.0000 mg | ORAL_TABLET | Freq: Two times a day (BID) | ORAL | 0 refills | Status: DC

## 2022-08-22 MED ORDER — FLUCONAZOLE 150 MG PO TABS: 150.0000 mg | ORAL_TABLET | Freq: Once | ORAL | 0 refills | Status: AC

## 2022-08-22 NOTE — Addendum Note (Signed)
Addended by: Catalina Antigua on: 08/22/2022 12:34 PM   Modules accepted: Orders

## 2022-08-23 ENCOUNTER — Other Ambulatory Visit: Payer: Self-pay | Admitting: Emergency Medicine

## 2022-08-23 DIAGNOSIS — N760 Acute vaginitis: Secondary | ICD-10-CM

## 2022-08-23 MED ORDER — METRONIDAZOLE 0.75 % VA GEL: 1.0000 | Freq: Every day | VAGINAL | 1 refills | Status: DC

## 2022-08-23 MED ORDER — FLUCONAZOLE 150 MG PO TABS: 150.0000 mg | ORAL_TABLET | Freq: Once | ORAL | 0 refills | Status: AC

## 2022-08-23 NOTE — Progress Notes (Signed)
Rx for metro gel as requested.

## 2022-09-10 ENCOUNTER — Ambulatory Visit (HOSPITAL_COMMUNITY)
Admission: EM | Admit: 2022-09-10 | Discharge: 2022-09-10 | Disposition: A | Discharge status: Home / Self Care | Payer: Medicaid Other | Attending: Family Medicine | Admitting: Family Medicine

## 2022-09-10 ENCOUNTER — Encounter (HOSPITAL_COMMUNITY): Payer: Self-pay

## 2022-09-10 DIAGNOSIS — L03111 Cellulitis of right axilla: Secondary | ICD-10-CM

## 2022-09-10 DIAGNOSIS — L02411 Cutaneous abscess of right axilla: Secondary | ICD-10-CM

## 2022-09-10 MED ORDER — AMOXICILLIN-POT CLAVULANATE 875-125 MG PO TABS: 1.0000 | ORAL_TABLET | Freq: Two times a day (BID) | ORAL | 0 refills | Status: AC

## 2022-09-10 MED ORDER — LIDOCAINE-EPINEPHRINE 1 %-1:100000 IJ SOLN
INTRAMUSCULAR | Status: AC
  Filled 2022-09-10: qty 1

## 2022-09-10 MED ORDER — KETOROLAC TROMETHAMINE 30 MG/ML IJ SOLN
30.0000 mg | Freq: Once | INTRAMUSCULAR | Status: AC
  Administered 2022-09-10: 30 mg via INTRAMUSCULAR

## 2022-09-10 MED ORDER — HYDROCODONE-ACETAMINOPHEN 5-325 MG PO TABS: 1.0000 | ORAL_TABLET | Freq: Four times a day (QID) | ORAL | 0 refills | Status: DC | PRN

## 2022-09-10 MED ORDER — KETOROLAC TROMETHAMINE 30 MG/ML IJ SOLN
INTRAMUSCULAR | Status: AC
  Filled 2022-09-10: qty 1

## 2022-09-10 NOTE — ED Provider Notes (Signed)
[facility]    CSN: [id number] Arrival date & time: 09/10/22  1646      History   Chief Complaint Chief Complaint  Patient presents with   Abscess    HPI Jennifer Leach is a 41 y.o. female.    Abscess  Here for swelling and pain under her right axilla.  It began about 3 days ago and then got worse in the last 24 hours.  No drainage so far and no fever or chills.  She has had these before  Past Medical History:  Diagnosis Date   Bacterial vaginitis 03/04/2017   Gonorrhea    Labial swelling 03/04/2017   MRSA (methicillin resistant staph aureus) culture positive 08/06/2014    Patient Active Problem List   Diagnosis Date Noted   Breakthrough bleeding on Depo-Provera 04/14/2020   Hidradenitis 04/14/2020   Gonorrhea 11/20/2019   Abnormal uterine bleeding (AUB) 11/19/2018   H/O tubal ligation 03/15/2017   Bacterial vaginitis 03/04/2017   Labial swelling 03/04/2017    Past Surgical History:  Procedure Laterality Date   TUBAL LIGATION N/A 02/19/2017   Procedure: POST PARTUM TUBAL LIGATION;  Surgeon: [name], MD;  [facility];  Service: Gynecology;  Laterality: N/A;   WISDOM TOOTH EXTRACTION      OB History     Gravida  4   Para  4   Term  4   Preterm      AB  0   Living  4      SAB      IAB  0   Ectopic      Multiple  0   Live Births  4            Home Medications    Prior to Admission medications   Medication Sig Start Date End Date Taking? Authorizing Provider  metroNIDAZOLE (FLAGYL) 500 MG tablet Take 1 tablet (500 mg total) by mouth 2 (two) times daily. 07/15/22   Troup Bing, MD  metroNIDAZOLE (FLAGYL) 500 MG tablet Take 1 tablet (500 mg total) by mouth 2 (two) times daily. 08/22/22   Constant, Peggy, MD  metroNIDAZOLE (METROGEL) 0.75 % vaginal gel Place 1 Applicatorful vaginally at bedtime. Apply one applicatorful to vagina at bedtime for 5 days 08/23/22   Anyanwu, Jethro Bastos, MD   Pseudoeph-Doxylamine-DM-APAP (NYQUIL PO) Take by mouth. Patient not taking: Reported on 04/14/2020    [provider]    Family History Family History  Problem Relation Age of Onset   Hypertension Mother     Social History Social History   Tobacco Use   Smoking status: Every Day    Packs/day: 0.25    Years: 9.00    Total pack years: 2.25    Types: Cigarettes    Last attempt to quit: 06/09/2012    Years since quitting: 10.2   Smokeless tobacco: Never   Tobacco comments:    pt reports is is smoking   Vaping Use   Vaping Use: Never used  Substance Use Topics   Alcohol use: No   Drug use: No     Allergies   Septra ds [sulfamethoxazole-trimethoprim]   Review of Systems Review of Systems   Physical Exam Triage Vital Signs ED Triage Vitals  Enc Vitals Group     BP 09/10/22 1809 128/82     Pulse Rate 09/10/22 1809 (!) 115     Resp 09/10/22 1809 12     Temp 09/10/22 1809 98.4 F (36.9 C)  Temp Source 09/10/22 1809 Oral     SpO2 09/10/22 1809 97 %     Weight --      Height --      Head Circumference --      Peak Flow --      Pain Score 09/10/22 1808 8     Pain Loc --      Pain Edu? --      Excl. in GC? --    No data found.  Updated Vital Signs BP 128/82 (BP Location: Right Arm)   Pulse (!) 115   Temp 98.4 F (36.9 C) (Oral)   Resp 12   LMP 08/28/2022   SpO2 97%   Visual Acuity Right Eye Distance:   Left Eye Distance:   Bilateral Distance:    Right Eye Near:   Left Eye Near:    Bilateral Near:     Physical Exam Vitals reviewed.  Constitutional:      General: She is not in acute distress.    Appearance: She is not toxic-appearing.  HENT:     Nose: Nose normal.  Skin:    Coloration: Skin is not pale.     Comments: There is induration and erythema and tenderness about 3 x 2 cm in the right axilla.  There appears to be an area of fluctuance in the middle.  Neurological:     General: No focal deficit present.     Mental Status: She  is alert.  Psychiatric:        Behavior: Behavior normal.      UC Treatments / Results  Labs (all labs ordered are listed, but only abnormal results are displayed) Labs Reviewed - No data to display  EKG   Radiology No results found.  Procedures Procedures (including critical care time)  Medications Ordered in UC Medications - No data to display  Initial Impression / Assessment and Plan / UC Course  I have reviewed the triage vital signs and the nursing notes.  Pertinent labs & imaging results that were available during my care of the patient were reviewed by me and considered in my medical decision making (see chart for details).     After verbal consent is obtained, 1% lidocaine with epinephrine is used for infiltration anesthesia, placed in the roof of the abscess.  #11 blade is used to puncture the roof of what seem to be the abscess, and a tiny bit of pus and then blood emerged.  EBL is 1 mL, no complications.  Augmentin is sent in for the infection and cellulitis and hydrocodone is sent in for the pain.  She is given a shot of Toradol here Diagnoses   Final diagnoses:  None   Discharge Instructions   None    ED Prescriptions   None    PDMP not reviewed this encounter.   Zenia Resides, MD 09/10/22 [phone]

## 2022-09-10 NOTE — Discharge Instructions (Signed)
Take amoxicillin-clavulanate 875 mg--1 tab twice daily with food for 7 days  Hydrocodone 5 mg--1 tablet every 6 hours as needed for pain.  This is best taken with food.  It can cause sleepiness or dizziness  You have been given a shot of Toradol 30 mg today.  You can use the QR code/website at the back of the summary paperwork to schedule yourself a new patient appointment with primary care

## 2022-09-10 NOTE — ED Triage Notes (Signed)
Pt is here for a possible cyst under left arm causing pain and discomfort x 2days

## 2022-09-19 ENCOUNTER — Other Ambulatory Visit (HOSPITAL_COMMUNITY)
Admission: RE | Admit: 2022-09-19 | Discharge: 2022-09-19 | Disposition: A | Discharge status: Home / Self Care | Payer: Medicaid Other | Source: Ambulatory Visit | Attending: Obstetrics | Admitting: Obstetrics

## 2022-09-19 ENCOUNTER — Encounter: Payer: Self-pay | Admitting: Obstetrics

## 2022-09-19 ENCOUNTER — Ambulatory Visit (INDEPENDENT_AMBULATORY_CARE_PROVIDER_SITE_OTHER): Payer: Medicaid Other | Admitting: Obstetrics

## 2022-09-19 VITALS — BP 124/82 | HR 68 | Wt 164.3 lb

## 2022-09-19 DIAGNOSIS — N946 Dysmenorrhea, unspecified: Secondary | ICD-10-CM

## 2022-09-19 DIAGNOSIS — N898 Other specified noninflammatory disorders of vagina: Secondary | ICD-10-CM

## 2022-09-19 DIAGNOSIS — N72 Inflammatory disease of cervix uteri: Secondary | ICD-10-CM | POA: Diagnosis not present

## 2022-09-19 DIAGNOSIS — F419 Anxiety disorder, unspecified: Secondary | ICD-10-CM | POA: Diagnosis not present

## 2022-09-19 DIAGNOSIS — N930 Postcoital and contact bleeding: Secondary | ICD-10-CM

## 2022-09-19 DIAGNOSIS — Z01419 Encounter for gynecological examination (general) (routine) without abnormal findings: Secondary | ICD-10-CM | POA: Diagnosis present

## 2022-09-19 DIAGNOSIS — F32A Depression, unspecified: Secondary | ICD-10-CM | POA: Diagnosis not present

## 2022-09-19 DIAGNOSIS — D508 Other iron deficiency anemias: Secondary | ICD-10-CM

## 2022-09-19 MED ORDER — CITRANATAL BLOOM 90-1 MG PO TABS: 1.0000 | ORAL_TABLET | Freq: Every day | ORAL | 11 refills | Status: DC

## 2022-09-19 MED ORDER — IBUPROFEN 800 MG PO TABS: 800.0000 mg | ORAL_TABLET | Freq: Three times a day (TID) | ORAL | 5 refills | Status: DC | PRN

## 2022-09-19 MED ORDER — DOXYCYCLINE HYCLATE 100 MG PO CAPS: 100.0000 mg | ORAL_CAPSULE | Freq: Two times a day (BID) | ORAL | 0 refills | Status: DC

## 2022-09-19 MED ORDER — METRONIDAZOLE 0.75 % VA GEL: 1.0000 | Freq: Two times a day (BID) | VAGINAL | 5 refills | Status: DC

## 2022-09-19 NOTE — Progress Notes (Signed)
Patient presents for annual exam. Pt reports having white discharge. Denies odor and pain. Denies any changes in her breast. Last PAP 09/07/21. Pt request PAP today. Pt reports having heavy bleeding with her cycles and having blood clots after sex. No other concerns at this time.  PHQ-14 GAD-8 Pt would like referral to [facility] for anxiety.

## 2022-09-19 NOTE — Progress Notes (Signed)
Subjective:        Jennifer Leach is a 41 y.o. female here for a routine exam.  Current complaints: Vaginal discharge and vaginal bleeding after intercourse..    Personal health questionnaire:  Is patient Ashkenazi Jewish, have a family history of breast and/or ovarian cancer: no Is there a family history of uterine cancer diagnosed at age < 52, gastrointestinal cancer, urinary tract cancer, family member who is a Personnel officer syndrome-associated carrier: no Is the patient overweight and hypertensive, family history of diabetes, personal history of gestational diabetes, preeclampsia or PCOS: no Is patient over 62, have PCOS,  family history of premature CHD under age 23, diabetes, smoke, have hypertension or peripheral artery disease:  no At any time, has a partner hit, kicked or otherwise hurt or frightened you?: no Over the past 2 weeks, have you felt down, depressed or hopeless?: no Over the past 2 weeks, have you felt little interest or pleasure in doing things?:no   Gynecologic History Patient's last menstrual period was 08/28/2022 (exact date). Contraception: tubal ligation Last Pap: 2022. Results were: normal Last mammogram: n/a. Results were: n/a  Obstetric History OB History  Gravida Para Term Preterm AB Living  4 4 4    0 4  SAB IAB Ectopic Multiple Live Births    0   0 4    # Outcome Date GA Lbr Len/2nd Weight Sex Delivery Anes PTL Lv  4 Term 02/19/17 [redacted]w[redacted]d 379:00 / 00:30 8 lb 3.2 oz (3.72 kg) F Vag-Spont EPI  LIV  3 Term 09/03/10    M Vag-Spont   LIV  2 Term 09/27/06    F Vag-Spont   LIV  1 Term 07/03/02    07/05/02   LIV    Past Medical History:  Diagnosis Date   Bacterial vaginitis 03/04/2017   Gonorrhea    Labial swelling 03/04/2017   MRSA (methicillin resistant staph aureus) culture positive 08/06/2014    Past Surgical History:  Procedure Laterality Date   TUBAL LIGATION N/A 02/19/2017   Procedure: POST PARTUM TUBAL LIGATION;  Surgeon: 02/21/2017,  MD;  Location: [facility];  Service: Gynecology;  Laterality: N/A;   WISDOM TOOTH EXTRACTION       Current Outpatient Medications:    HYDROcodone-acetaminophen (NORCO/VICODIN) 5-325 MG tablet, Take 1 tablet by mouth every 6 (six) hours as needed (pain). (Patient not taking: Reported on 09/19/2022), Disp: 12 tablet, Rfl: 0   Pseudoeph-Doxylamine-DM-APAP (NYQUIL PO), Take by mouth. (Patient not taking: Reported on 04/14/2020), Disp: , Rfl:  Allergies  Allergen Reactions   Septra Ds [Sulfamethoxazole-Trimethoprim] Anaphylaxis, Swelling, Rash and Other (See Comments)    Reaction:  Facial swelling     Social History   Tobacco Use   Smoking status: Every Day    Packs/day: 0.25    Years: 9.00    Total pack years: 2.25    Types: Cigarettes    Last attempt to quit: 06/09/2012    Years since quitting: 10.2   Smokeless tobacco: Never   Tobacco comments:    pt reports is is smoking   Substance Use Topics   Alcohol use: No    Family History  Problem Relation Age of Onset   Hypertension Mother       Review of Systems  Constitutional: negative for fatigue and weight loss Respiratory: negative for cough and wheezing Cardiovascular: negative for chest pain, fatigue and palpitations Gastrointestinal: negative for abdominal pain and change in bowel habits Musculoskeletal:negative for myalgias Neurological: negative  for gait problems and tremors Behavioral/Psych: negative for abusive relationship, depression Endocrine: negative for temperature intolerance    Genitourinary: positive for abnormally heavy and painful; menstrual periods, and bleeding after intercourse.  Negative for genital lesions, hot flashes, sexual problems Integument/breast: negative for breast lump, breast tenderness, nipple discharge and skin lesion(s)    Objective:       BP 124/82   Pulse 68   Wt 164 lb 4.8 oz (74.5 kg)   LMP 08/28/2022 (Exact Date)   BMI 32.09 kg/m  General:   Alert and no distress   Skin:   no rash or abnormalities  Lungs:   clear to auscultation bilaterally  Heart:   regular rate and rhythm, S1, S2 normal, no murmur, click, rub or gallop  Breasts:   normal without suspicious masses, skin or nipple changes or axillary nodes  Abdomen:  normal findings: no organomegaly, soft, non-tender and no hernia  Pelvis:  External genitalia: normal general appearance Urinary system: urethral meatus normal and bladder without fullness, nontender Vaginal: normal without tenderness, induration or masses Cervix: normal appearance.  Friable, bled easily with pap smear Adnexa: normal bimanual exam Uterus: anteverted and non-tender, normal size   Lab Review Urine pregnancy test Labs reviewed yes Radiologic studies reviewed no  I have spent a total of 20 minutes of face-to-face time, excluding clinical staff time, reviewing notes and preparing to see patient, ordering tests and/or medications, and counseling the patient.    Assessment:    1. Encounter for routine gynecological examination with Papanicolaou smear of cervix Rx: - Hemoglobin A1c - Cytology - PAP  2. Dysmenorrhea Rx: - ibuprofen (ADVIL) 800 MG tablet; Take 1 tablet (800 mg total) by mouth every 8 (eight) hours as needed. Take every 8 hours on schedule during menstruation , monthly.  Dispense: 30 tablet; Refill: 5  3. Vaginal discharge Rx: - metroNIDAZOLE (METROGEL) 0.75 % vaginal gel; Place 1 Applicatorful vaginally 2 (two) times daily.  Dispense: 70 g; Refill: 5 - Cervicovaginal ancillary only  4. Cervicitis Rx: - doxycycline (VIBRAMYCIN) 100 MG capsule; Take 1 capsule (100 mg total) by mouth 2 (two) times daily.  Dispense: 28 capsule; Refill: 0  5. Postcoital and contact bleeding Rx: - US PELVIC COMPLETE WITH TRANSVAGINAL; Future  6. Anxiety and depression Rx: - Ambulatory referral to Integrated Behavioral [facility]  7. Iron deficiency anemia secondary to inadequate dietary iron intake Rx: - TSH -  Ferritin - Comprehensive metabolic panel - CBC - Prenatal-DSS-FeCb-FeGl-FA (CITRANATAL BLOOM) 90-1 MG TABS; Take 1 tablet by mouth daily before breakfast.  Dispense: 30 tablet; Refill: 11     Plan:    Education reviewed: calcium supplements, depression evaluation, low fat, low cholesterol diet, safe sex/STD prevention, self breast exams, and weight bearing exercise. Follow up in: 2 weeks.    Brock Bad, MD 09/19/2022 8:35 AM

## 2022-09-20 ENCOUNTER — Other Ambulatory Visit: Payer: Self-pay | Admitting: Obstetrics

## 2022-09-20 LAB — COMPREHENSIVE METABOLIC PANEL
ALT: 10 IU/L (ref 0–32)
AST: 12 IU/L (ref 0–40)
Albumin/Globulin Ratio: 1.4 (ref 1.2–2.2)
Albumin: 4.1 g/dL (ref 3.9–4.9)
Alkaline Phosphatase: 92 IU/L (ref 44–121)
BUN/Creatinine Ratio: 13 (ref 9–23)
BUN: 10 mg/dL (ref 6–20)
Bilirubin Total: 0.7 mg/dL (ref 0.0–1.2)
CO2: 17 mmol/L — ABNORMAL LOW (ref 20–29)
Calcium: 8.7 mg/dL (ref 8.7–10.2)
Chloride: 109 mmol/L — ABNORMAL HIGH (ref 96–106)
Creatinine, Ser: 0.78 mg/dL (ref 0.57–1.00)
Globulin, Total: 2.9 g/dL (ref 1.5–4.5)
Glucose: 93 mg/dL (ref 70–99)
Potassium: 4.2 mmol/L (ref 3.5–5.2)
Sodium: 142 mmol/L (ref 134–144)
Total Protein: 7 g/dL (ref 6.0–8.5)
eGFR: 99 mL/min/{1.73_m2} (ref >59)

## 2022-09-20 LAB — CBC
Hematocrit: 41.8 % (ref 34.0–46.6)
Hemoglobin: 14.1 g/dL (ref 11.1–15.9)
MCH: 30.5 pg (ref 26.6–33.0)
MCHC: 33.7 g/dL (ref 31.5–35.7)
MCV: 90 fL (ref 79–97)
Platelets: 269 10*3/uL (ref 150–450)
RBC: 4.63 x10E6/uL (ref 3.77–5.28)
RDW: 12.1 % (ref 11.7–15.4)
WBC: 5.9 10*3/uL (ref 3.4–10.8)

## 2022-09-20 LAB — CERVICOVAGINAL ANCILLARY ONLY
Bacterial Vaginitis (gardnerella): POSITIVE — AB
Candida Glabrata: NEGATIVE
Candida Vaginitis: NEGATIVE
Chlamydia: NEGATIVE
Comment: NEGATIVE
Comment: NEGATIVE
Comment: NEGATIVE
Comment: NEGATIVE
Comment: NEGATIVE
Comment: NORMAL
Neisseria Gonorrhea: NEGATIVE
Trichomonas: NEGATIVE

## 2022-09-20 LAB — HEMOGLOBIN A1C
Est. average glucose Bld gHb Est-mCnc: 100 mg/dL
Hgb A1c MFr Bld: 5.1 % (ref 4.8–5.6)

## 2022-09-20 LAB — FERRITIN: Ferritin: 42 ng/mL (ref 15–150)

## 2022-09-20 LAB — TSH: TSH: 0.55 u[IU]/mL (ref 0.450–4.500)

## 2022-09-21 LAB — CYTOLOGY - PAP
Comment: NEGATIVE
Diagnosis: NEGATIVE
High risk HPV: NEGATIVE

## 2022-09-22 ENCOUNTER — Telehealth: Payer: Self-pay | Admitting: Emergency Medicine

## 2022-09-22 NOTE — Progress Notes (Unsigned)
HPI: Jennifer Leach is a 41 y.o. female, who is here today to establish care.  Former PCP: Dr. Clearance Coots Last preventive routine visit: Gyn examination 09/20/22.  She has a history of hydradenitis and recently completed treatment doxycycline. Recurrent BV, treated by her gyn with Metrogel. She denies any history of HLD, diabetes, or HTN.  She reports a history of depression and anxiety, with anxiety symptoms starting approximately three months ago.  She experiences episodes of fast heartbeat, shaky hands, and difficulty swallowing and breathing. She attributes these symptoms to smoking a vape, have resolved since she stopped use ,three to four months ago. States that her mind "races" constantly, leading to difficulty sleeping and forgetfulness. She denies any thoughts of self-harm or harm to others.  Denies ever been diagnosed with bipolar disorder or depression and has not taken any medication for these conditions.      09/25/2022    3:08 PM 09/19/2022    8:24 AM  Depression screen PHQ 2/9  Decreased Interest 2 2  Down, Depressed, Hopeless 2 2  PHQ - 2 Score 4 4  Altered sleeping 2 0  Tired, decreased energy 2 3  Change in appetite 1 3  Feeling bad or failure about yourself  0 1  Trouble concentrating 2 3  Moving slowly or fidgety/restless 0 0  Suicidal thoughts 0 0  PHQ-9 Score 11 14  Difficult doing work/chores Somewhat difficult Not difficult at all      09/25/2022    3:09 PM 09/19/2022    8:26 AM  GAD 7 : Generalized Anxiety Score  Nervous, Anxious, on Edge 1 2  Control/stop worrying 2 0  Worry too much - different things 2 3  Trouble relaxing 2 1  Restless 0 0  Easily annoyed or irritable 0 1  Afraid - awful might happen 0 1  Total GAD 7 Score 7 8  Anxiety Difficulty Somewhat difficult Not difficult at all   Review of Systems  Constitutional:  Positive for fatigue. Negative for chills and fever.  HENT:  Negative for mouth sores and sore throat.    Respiratory:  Negative for cough and wheezing.   Cardiovascular:  Negative for chest pain and leg swelling.  Gastrointestinal:  Negative for abdominal pain, nausea and vomiting.  Endocrine: Negative for cold intolerance and heat intolerance.  Neurological:  Negative for syncope, weakness and headaches.  Psychiatric/Behavioral:  Positive for sleep disturbance. Negative for confusion and hallucinations. The patient is nervous/anxious.   See other pertinent positives and negatives in HPI.  Current Outpatient Medications on File Prior to Visit  Medication Sig Dispense Refill   doxycycline (VIBRAMYCIN) 100 MG capsule Take 1 capsule (100 mg total) by mouth 2 (two) times daily. 28 capsule 0   ibuprofen (ADVIL) 800 MG tablet Take 1 tablet (800 mg total) by mouth every 8 (eight) hours as needed. Take every 8 hours on schedule during menstruation , monthly. 30 tablet 5   metroNIDAZOLE (METROGEL) 0.75 % vaginal gel Place 1 Applicatorful vaginally 2 (two) times daily. 70 g 5   No current facility-administered medications on file prior to visit.   Past Medical History:  Diagnosis Date   Bacterial vaginitis 03/04/2017   Gonorrhea    Labial swelling 03/04/2017   MRSA (methicillin resistant staph aureus) culture positive 08/06/2014   Allergies  Allergen Reactions   Septra Ds [Sulfamethoxazole-Trimethoprim] Anaphylaxis, Swelling, Rash and Other (See Comments)    Reaction:  Facial swelling    Family History  Problem Relation Age  of Onset   Hypertension Mother    Social History   Socioeconomic History   Marital status: Single    Spouse name: Not on file   Number of children: Not on file   Years of education: Not on file   Highest education level: Not on file  Occupational History   Not on file  Tobacco Use   Smoking status: Every Day    Packs/day: 0.25    Years: 9.00    Total pack years: 2.25    Types: Cigarettes    Last attempt to quit: 06/09/2012    Years since quitting: 10.3    Smokeless tobacco: Never   Tobacco comments:    pt reports is is smoking   Vaping Use   Vaping Use: Never used  Substance and Sexual Activity   Alcohol use: No   Drug use: No   Sexual activity: Yes    Partners: Male    Birth control/protection: Surgical  Other Topics Concern   Not on file  Social History Narrative   Not on file   Social Determinants of Health   Financial Resource Strain: Not on file  Food Insecurity: Not on file  Transportation Needs: Not on file  Physical Activity: Not on file  Stress: Not on file  Social Connections: Not on file   Vitals:   09/25/22 1425  BP: 110/70  Pulse: 73  Resp: 12  SpO2: 97%  Body mass index is 32.42 kg/m. Physical Exam Vitals and nursing note reviewed.  Constitutional:      General: She is not in acute distress.    Appearance: She is well-developed.  HENT:     Head: Normocephalic and atraumatic.     Mouth/Throat:     Mouth: Mucous membranes are moist.  Eyes:     Conjunctiva/sclera: Conjunctivae normal.  Cardiovascular:     Rate and Rhythm: Normal rate and regular rhythm.     Heart sounds: No murmur heard. Pulmonary:     Effort: Pulmonary effort is normal. No respiratory distress.     Breath sounds: Normal breath sounds.  Abdominal:     Palpations: Abdomen is soft. There is no mass.     Tenderness: There is no abdominal tenderness.  Lymphadenopathy:     Cervical: No cervical adenopathy.  Skin:    General: Skin is warm.     Findings: No erythema or rash.  Neurological:     General: No focal deficit present.     Mental Status: She is alert and oriented to person, place, and time.     Gait: Gait normal.  Psychiatric:        Mood and Affect: Mood is anxious.        Thought Content: Thought content does not include suicidal ideation. Thought content does not include suicidal plan.   ASSESSMENT AND PLAN: Ms.Hessie was seen today for establish care.  Diagnoses and all orders for this visit:  Insomnia,  unspecified type Assessment & Plan: Possible etiologies discussed. Treating her depression and anxiety may help with problem. Bipolar disorder to be consider if problem gets worse. Good sleep hygiene recommended for now.   GAD (generalized anxiety disorder) Assessment & Plan: We discussed treatment options, she agrees with trying Lexapro 10 mg daily. Some side effects reviewed. Recommend establishing with psychotherapist.  Orders: -     Escitalopram Oxalate; Take 1 tablet (10 mg total) by mouth daily.  Dispense: 30 tablet; Refill: 1  Depression, major, recurrent, moderate (HCC) Assessment & Plan: Lexapro  10 mg added today. Sid effects discussed. Instructed about warning signs. F/U in 6 weeks, before if needed.   Return in about 6 weeks (around 11/06/2022) for GAD.  Rosemary Pentecost G. [place], MD  [facility]. [place] office.

## 2022-09-22 NOTE — Telephone Encounter (Signed)
Pt informed of results.

## 2022-09-25 ENCOUNTER — Ambulatory Visit (HOSPITAL_BASED_OUTPATIENT_CLINIC_OR_DEPARTMENT_OTHER)
Admission: RE | Admit: 2022-09-25 | Discharge: 2022-09-25 | Disposition: A | Discharge status: Home / Self Care | Payer: Medicaid Other | Source: Ambulatory Visit | Attending: Obstetrics | Admitting: Obstetrics

## 2022-09-25 ENCOUNTER — Encounter: Payer: Self-pay | Admitting: Family Medicine

## 2022-09-25 ENCOUNTER — Ambulatory Visit: Payer: Medicaid Other | Admitting: Family Medicine

## 2022-09-25 VITALS — BP 110/70 | HR 73 | Resp 12 | Ht 60.0 in | Wt 166.0 lb

## 2022-09-25 DIAGNOSIS — F331 Major depressive disorder, recurrent, moderate: Secondary | ICD-10-CM | POA: Insufficient documentation

## 2022-09-25 DIAGNOSIS — F411 Generalized anxiety disorder: Secondary | ICD-10-CM

## 2022-09-25 DIAGNOSIS — N930 Postcoital and contact bleeding: Secondary | ICD-10-CM | POA: Insufficient documentation

## 2022-09-25 DIAGNOSIS — G4700 Insomnia, unspecified: Secondary | ICD-10-CM | POA: Diagnosis not present

## 2022-09-25 MED ORDER — ESCITALOPRAM OXALATE 10 MG PO TABS: 10.0000 mg | ORAL_TABLET | Freq: Every day | ORAL | 1 refills | Status: DC

## 2022-09-25 NOTE — Patient Instructions (Addendum)
A few things to remember from today's visit:  Insomnia, unspecified type  GAD (generalized anxiety disorder)  Today we started Lexapro, this type of medications can increase suicidal risk. This is more prevalent among children,adolecents, and young adults with major depression or other psychiatric disorders. It can also make depression worse. Most common side effects are gastrointestinal, self limited after a few weeks: diarrhea, nausea, constipation  Or diarrhea among some.  In general it is well tolerated. We will follow closely.  Establish with psychotherapist, a list of providers given. Do not use My Chart to request refills or for acute issues that need immediate attention. If you send a my chart message, it may take a few days to be addressed, specially if I am not in the office.  Please be sure medication list is accurate. If a new problem present, please set up appointment sooner than planned today.

## 2022-09-28 NOTE — Assessment & Plan Note (Signed)
Lexapro 10 mg added today. Sid effects discussed. Instructed about warning signs. F/U in 6 weeks, before if needed.

## 2022-09-28 NOTE — Assessment & Plan Note (Addendum)
We discussed treatment options, she agrees with trying Lexapro 10 mg daily. Some side effects reviewed. Recommend establishing with psychotherapist.

## 2022-09-28 NOTE — Assessment & Plan Note (Signed)
Possible etiologies discussed. Treating her depression and anxiety may help with problem. Bipolar disorder to be consider if problem gets worse. Good sleep hygiene recommended for now.

## 2022-10-05 ENCOUNTER — Institutional Professional Consult (permissible substitution): Payer: Medicaid Other | Admitting: Licensed Clinical Social Worker

## 2022-10-10 ENCOUNTER — Telehealth (INDEPENDENT_AMBULATORY_CARE_PROVIDER_SITE_OTHER): Payer: Medicaid Other | Admitting: Obstetrics

## 2022-10-10 ENCOUNTER — Encounter: Payer: Self-pay | Admitting: Obstetrics

## 2022-10-10 DIAGNOSIS — B379 Candidiasis, unspecified: Secondary | ICD-10-CM

## 2022-10-10 DIAGNOSIS — N760 Acute vaginitis: Secondary | ICD-10-CM

## 2022-10-10 DIAGNOSIS — N930 Postcoital and contact bleeding: Secondary | ICD-10-CM

## 2022-10-10 DIAGNOSIS — B9689 Other specified bacterial agents as the cause of diseases classified elsewhere: Secondary | ICD-10-CM

## 2022-10-10 MED ORDER — METRONIDAZOLE 0.75 % VA GEL: 1.0000 | Freq: Two times a day (BID) | VAGINAL | 5 refills | Status: DC

## 2022-10-10 MED ORDER — FLUCONAZOLE 150 MG PO TABS: 150.0000 mg | ORAL_TABLET | Freq: Once | ORAL | 0 refills | Status: AC

## 2022-10-10 NOTE — Progress Notes (Signed)
Patient presents for Mychart visit for follow up for abnormal bleeding. Patient identified with two patient identifiers. Patient states that she has not had intercourse since her last visit, so she does not know if sx have resolved. She states that her last cycle lasted for about 13 days. No other concerns.

## 2022-10-10 NOTE — Progress Notes (Signed)
GYNECOLOGY VIRTUAL VISIT ENCOUNTER NOTE  Provider location: [facility]   Patient location: Home  I connected with Jennifer Leach on 10/10/22 at  8:35 AM EST by MyChart Video Encounter and verified that I am speaking with the correct person using two identifiers.   I discussed the limitations, risks, security and privacy concerns of performing an evaluation and management service virtually and the availability of in person appointments. I also discussed with the patient that there may be a patient responsible charge related to this service. The patient expressed understanding and agreed to proceed.   History:  Jennifer Leach is a 42 y.o. [phone] female being evaluated today for follow up for postcoital bleeding. She denies any abnormal vaginal discharge, bleeding, pelvic pain or other concerns.       Past Medical History:  Diagnosis Date   Bacterial vaginitis 03/04/2017   Gonorrhea    Labial swelling 03/04/2017   MRSA (methicillin resistant staph aureus) culture positive 08/06/2014   Past Surgical History:  Procedure Laterality Date   TUBAL LIGATION N/A 02/19/2017   Procedure: POST PARTUM TUBAL LIGATION;  Surgeon: Chancy Milroy, MD;  Location: [facility];  Service: Gynecology;  Laterality: N/A;   WISDOM TOOTH EXTRACTION     The following portions of the patient's history were reviewed and updated as appropriate: allergies, current medications, past family history, past medical history, past social history, past surgical history and problem list.   Health Maintenance:  Normal pap and negative HRHPV on 09/19/2021.     Review of Systems:  Pertinent items noted in HPI and remainder of comprehensive ROS otherwise negative.  Physical Exam:   General:  Alert, oriented and cooperative. Patient appears to be in no acute distress.  Mental Status: Normal mood and affect. Normal behavior. Normal judgment and thought content.   Respiratory:  Normal respiratory effort, no problems with respiration noted  Rest of physical exam deferred due to type of encounter  Labs and Imaging No results found for this or any previous visit (from the past 336 hour(s)). US PELVIC COMPLETE WITH TRANSVAGINAL  Result Date: 09/25/2022 CLINICAL DATA:  Postcoital vaginal bleeding EXAM: ULTRASOUND OF PELVIS TECHNIQUE: Transabdominal and transvaginalultrasound examination of the pelvis was performed including evaluation of the uterus, ovaries, adnexal regions, and pelvic cul-de-sac. COMPARISON:  09/12/2021 FINDINGS: Uterusanteverted, 8 x 6 x 5 cm. The endometrium unremarkable, 1 cm. The uterine cavity is empty. There are no uterine masses. Right ovary Unremarkable, 3.3 x 2.7 x 1.6 cm Left ovary 3.73.3 x 2.1 cm with a dominant follicular cyst measuring 2.9 cm Images of the adnexae demonstrated no masses or fluid collections. IMPRESSION: Unremarkable examination of the pelvis. Electronically Signed   By: Sammie Bench M.D.   On: 09/25/2022 08:45       Assessment and Plan:     1. Postcoital and contact bleeding - ultrasound is WNL's , and she has had no further bleeding  2. BV (bacterial vaginosis) Rx: - metroNIDAZOLE (METROGEL) 0.75 % vaginal gel; Place 1 Applicatorful vaginally 2 (two) times daily.  Dispense: 70 g; Refill: 5  3. Candida albicans infection Rx: - fluconazole (DIFLUCAN) 150 MG tablet; Take 1 tablet (150 mg total) by mouth once for 1 dose.  Dispense: 1 tablet; Refill: 0       I discussed the assessment and treatment plan with the patient. The patient was provided an opportunity to ask questions and all were answered. The patient agreed with the plan  and demonstrated an understanding of the instructions.   The patient was advised to call back or seek an in-person evaluation/go to the ED if the symptoms worsen or if the condition fails to improve as anticipated.  I have spent a total of 10 minutes of  non-face-to-face time, excluding  clinical staff time, reviewing notes and preparing to see patient, ordering tests and/or medications, and counseling the patient.    Baltazar Najjar, MD Center for [facility], [place], [facility] 10/10/2022

## 2022-10-16 ENCOUNTER — Institutional Professional Consult (permissible substitution): Payer: Medicaid Other | Admitting: Licensed Clinical Social Worker

## 2022-10-17 ENCOUNTER — Other Ambulatory Visit: Payer: Self-pay

## 2022-11-02 ENCOUNTER — Other Ambulatory Visit: Payer: Self-pay

## 2022-11-02 ENCOUNTER — Emergency Department (HOSPITAL_COMMUNITY): Payer: Medicaid Other

## 2022-11-02 ENCOUNTER — Emergency Department (HOSPITAL_COMMUNITY)
Admission: EM | Admit: 2022-11-02 | Discharge: 2022-11-02 | Disposition: A | Discharge status: Home / Self Care | Payer: Medicaid Other | Attending: Emergency Medicine | Admitting: Emergency Medicine

## 2022-11-02 DIAGNOSIS — J101 Influenza due to other identified influenza virus with other respiratory manifestations: Secondary | ICD-10-CM

## 2022-11-02 DIAGNOSIS — R059 Cough, unspecified: Secondary | ICD-10-CM | POA: Diagnosis present

## 2022-11-02 DIAGNOSIS — Z20822 Contact with and (suspected) exposure to covid-19: Secondary | ICD-10-CM | POA: Diagnosis not present

## 2022-11-02 DIAGNOSIS — J111 Influenza due to unidentified influenza virus with other respiratory manifestations: Secondary | ICD-10-CM | POA: Diagnosis not present

## 2022-11-02 LAB — BASIC METABOLIC PANEL
Anion gap: 12 (ref 5–15)
BUN: 10 mg/dL (ref 6–20)
CO2: 19 mmol/L — ABNORMAL LOW (ref 22–32)
Calcium: 7.9 mg/dL — ABNORMAL LOW (ref 8.9–10.3)
Chloride: 102 mmol/L (ref 98–111)
Creatinine, Ser: 0.91 mg/dL (ref 0.44–1.00)
GFR, Estimated: >60 mL/min (ref >60)
Glucose, Bld: 99 mg/dL (ref 70–99)
Potassium: 3.5 mmol/L (ref 3.5–5.1)
Sodium: 133 mmol/L — ABNORMAL LOW (ref 135–145)

## 2022-11-02 LAB — CBC
HCT: 36.4 % (ref 36.0–46.0)
Hemoglobin: 11.8 g/dL — ABNORMAL LOW (ref 12.0–15.0)
MCH: 30.4 pg (ref 26.0–34.0)
MCHC: 32.4 g/dL (ref 30.0–36.0)
MCV: 93.8 fL (ref 80.0–100.0)
Platelets: 161 10*3/uL (ref 150–400)
RBC: 3.88 MIL/uL (ref 3.87–5.11)
RDW: 12.9 % (ref 11.5–15.5)
WBC: 3 10*3/uL — ABNORMAL LOW (ref 4.0–10.5)
nRBC: 0 % (ref 0.0–0.2)

## 2022-11-02 LAB — RESP PANEL BY RT-PCR (RSV, FLU A&B, COVID)  RVPGX2
Influenza A by PCR: NEGATIVE
Influenza B by PCR: POSITIVE — AB
Resp Syncytial Virus by PCR: NEGATIVE
SARS Coronavirus 2 by RT PCR: NEGATIVE

## 2022-11-02 LAB — TROPONIN I (HIGH SENSITIVITY)
Troponin I (High Sensitivity): 5 ng/L (ref <18)
Troponin I (High Sensitivity): 5 ng/L (ref <18)

## 2022-11-02 MED ORDER — KETOROLAC TROMETHAMINE 15 MG/ML IJ SOLN
30.0000 mg | Freq: Once | INTRAMUSCULAR | Status: AC
  Administered 2022-11-02: 30 mg via INTRAMUSCULAR
  Filled 2022-11-02: qty 2

## 2022-11-02 MED ORDER — ACETAMINOPHEN 500 MG PO TABS
1000.0000 mg | ORAL_TABLET | Freq: Once | ORAL | Status: AC
  Administered 2022-11-02: 1000 mg via ORAL
  Filled 2022-11-02: qty 2

## 2022-11-02 MED ORDER — HYDROCOD POLI-CHLORPHE POLI ER 10-8 MG/5ML PO SUER: 5.0000 mL | Freq: Two times a day (BID) | ORAL | 0 refills | Status: DC | PRN

## 2022-11-02 NOTE — ED Triage Notes (Signed)
Pt. Stated, I've had some body aches chest is burning, and feel light headed this started 2 days ago.

## 2022-11-02 NOTE — ED Provider Notes (Signed)
[facility] Provider Note   CSN: [id number] Arrival date & time: 11/02/22  3762     History  Chief Complaint  Patient presents with   Chest Pain   Dizziness   Generalized Body Aches   Blurred Vision    Jennifer Leach is a 42 y.o. female.  42 year old female presents with cough congestion URI symptoms times several days.  Has had fever at home with associated myalgias.  Has had nausea but no vomiting.  Denies any dyspnea.  No urinary symptoms.  Does have positive COVID exposure.  No CHF or ACS symptoms       Home Medications Prior to Admission medications   Medication Sig Start Date End Date Taking? Authorizing Provider  doxycycline (VIBRAMYCIN) 100 MG capsule Take 1 capsule (100 mg total) by mouth 2 (two) times daily. 09/19/22   Shelly Bombard, MD  escitalopram (LEXAPRO) 10 MG tablet Take 1 tablet (10 mg total) by mouth daily. Patient not taking: Reported on 10/10/2022 09/25/22   Martinique, Betty G, MD  ibuprofen (ADVIL) 800 MG tablet Take 1 tablet (800 mg total) by mouth every 8 (eight) hours as needed. Take every 8 hours on schedule during menstruation , monthly. 09/19/22   Shelly Bombard, MD  metroNIDAZOLE (METROGEL) 0.75 % vaginal gel Place 1 Applicatorful vaginally 2 (two) times daily. 10/10/22   Shelly Bombard, MD      Allergies    Septra ds [sulfamethoxazole-trimethoprim]    Review of Systems   Review of Systems  All other systems reviewed and are negative.   Physical Exam Updated Vital Signs BP 120/82 (BP Location: Right Arm)   Pulse 86   Temp 98.4 F (36.9 C) (Oral)   Resp 18   Ht 1.676 m (5\' 6" )   Wt 68 kg   LMP 10/25/2022 (Exact Date)   SpO2 100%   BMI 24.21 kg/m  Physical Exam Vitals and nursing note reviewed.  Constitutional:      General: She is not in acute distress.    Appearance: Normal appearance. She is well-developed. She is not toxic-appearing.  HENT:     Head: Normocephalic and  atraumatic.     Mouth/Throat:     Pharynx: No pharyngeal swelling or posterior oropharyngeal erythema.  Eyes:     General: Lids are normal.     Conjunctiva/sclera: Conjunctivae normal.     Pupils: Pupils are equal, round, and reactive to light.  Neck:     Thyroid: No thyroid mass.     Trachea: No tracheal deviation.  Cardiovascular:     Rate and Rhythm: Normal rate and regular rhythm.     Heart sounds: Normal heart sounds. No murmur heard.    No gallop.  Pulmonary:     Effort: Pulmonary effort is normal. No respiratory distress.     Breath sounds: Normal breath sounds. No stridor. No decreased breath sounds, wheezing, rhonchi or rales.  Abdominal:     General: There is no distension.     Palpations: Abdomen is soft.     Tenderness: There is no abdominal tenderness. There is no rebound.  Musculoskeletal:        General: No tenderness. Normal range of motion.     Cervical back: Normal range of motion and neck supple.  Skin:    General: Skin is warm and dry.     Findings: No abrasion or rash.  Neurological:     Mental Status: She is alert and oriented  to person, place, and time. Mental status is at baseline.     GCS: GCS eye subscore is 4. GCS verbal subscore is 5. GCS motor subscore is 6.     Cranial Nerves: No cranial nerve deficit.     Sensory: No sensory deficit.     Motor: Motor function is intact.  Psychiatric:        Attention and Perception: Attention normal.        Speech: Speech normal.        Behavior: Behavior normal.     ED Results / Procedures / Treatments   Labs (all labs ordered are listed, but only abnormal results are displayed) Labs Reviewed  RESP PANEL BY RT-PCR (RSV, FLU A&B, COVID)  RVPGX2 - Abnormal; Notable for the following components:      Result Value   Influenza B by PCR POSITIVE (*)    All other components within normal limits  BASIC METABOLIC PANEL - Abnormal; Notable for the following components:   Sodium 133 (*)    CO2 19 (*)    Calcium  7.9 (*)    All other components within normal limits  CBC - Abnormal; Notable for the following components:   WBC 3.0 (*)    Hemoglobin 11.8 (*)    All other components within normal limits  TROPONIN I (HIGH SENSITIVITY)  TROPONIN I (HIGH SENSITIVITY)    EKG EKG Interpretation  Date/Time:  Thursday November 02 2022 09:48:11 EST Ventricular Rate:  96 PR Interval:  166 QRS Duration: 68 QT Interval:  304 QTC Calculation: 384 R Axis:   55 Text Interpretation: Normal sinus rhythm Low voltage QRS Septal infarct , age undetermined Abnormal ECG When compared with ECG of 12-Apr-2022 22:27, PREVIOUS ECG IS PRESENT No significant change since last tracing Confirmed by Lacretia Leigh ([id number]) on 11/02/2022 5:11:14 PM  Radiology DG Chest 1 View  Result Date: 11/02/2022 CLINICAL DATA:  Chest pain EXAM: CHEST  1 VIEW COMPARISON:  04/12/22 CXR FINDINGS: No pleural effusion. No pneumothorax. Normal cardiac and mediastinal contours. No focal airspace opacity. No displaced rib fractures. Visualized upper abdomen is unremarkable. IMPRESSION: No radiographic finding to explain chest pain. Electronically Signed   By: Marin Roberts M.D.   On: 11/02/2022 10:57    Procedures Procedures    Medications Ordered in ED Medications  ketorolac (TORADOL) 15 MG/ML injection 30 mg (has no administration in time range)  acetaminophen (TYLENOL) tablet 1,000 mg (1,000 mg Oral Given 11/02/22 1610)    ED Course/ Medical Decision Making/ A&P                             Medical Decision Making Risk Prescription drug management.   Patient is EKG per my interpretation shows no acute findings here.  Chest x-ray per my interpretation shows no acute findings.  She is flu be positive here.  Was noted to be febrile given Tylenol and has defervesced.  Complained of a sore throat and her pharynx was clear.  Given Toradol for symptomatic comfort.  Will place patient on t antitussives and give work note        Final  Clinical Impression(s) / ED Diagnoses Final diagnoses:  None    Rx / DC Orders ED Discharge Orders     None         Lacretia Leigh, MD 11/02/22 1725

## 2022-11-02 NOTE — ED Provider Notes (Incomplete)
[facility] Provider Note   CSN: [id number] Arrival date & time: 11/02/22  2426     History {Add pertinent medical, surgical, social history, OB history to HPI:1} Chief Complaint  Patient presents with   Chest Pain   Dizziness   Generalized Body Aches   Blurred Vision    TAMSYN OWUSU is a 42 y.o. female.   Chest Pain Associated symptoms: dizziness   Dizziness Associated symptoms: chest pain        Home Medications Prior to Admission medications   Medication Sig Start Date End Date Taking? Authorizing Provider  doxycycline (VIBRAMYCIN) 100 MG capsule Take 1 capsule (100 mg total) by mouth 2 (two) times daily. 09/19/22   Shelly Bombard, MD  escitalopram (LEXAPRO) 10 MG tablet Take 1 tablet (10 mg total) by mouth daily. Patient not taking: Reported on 10/10/2022 09/25/22   Martinique, Betty G, MD  ibuprofen (ADVIL) 800 MG tablet Take 1 tablet (800 mg total) by mouth every 8 (eight) hours as needed. Take every 8 hours on schedule during menstruation , monthly. 09/19/22   Shelly Bombard, MD  metroNIDAZOLE (METROGEL) 0.75 % vaginal gel Place 1 Applicatorful vaginally 2 (two) times daily. 10/10/22   Shelly Bombard, MD      Allergies    Septra ds [sulfamethoxazole-trimethoprim]    Review of Systems   Review of Systems  Cardiovascular:  Positive for chest pain.  Neurological:  Positive for dizziness.    Physical Exam Updated Vital Signs BP 116/74   Pulse 77   Temp 98.7 F (37.1 C)   Resp 17   Ht 5\' 6"  (1.676 m)   Wt 68 kg   LMP 10/25/2022 (Exact Date)   SpO2 97%   BMI 24.21 kg/m  Physical Exam  ED Results / Procedures / Treatments   Labs (all labs ordered are listed, but only abnormal results are displayed) Labs Reviewed  RESP PANEL BY RT-PCR (RSV, FLU A&B, COVID)  RVPGX2 - Abnormal; Notable for the following components:      Result Value   Influenza B by PCR POSITIVE (*)    All other components within  normal limits  BASIC METABOLIC PANEL - Abnormal; Notable for the following components:   Sodium 133 (*)    CO2 19 (*)    Calcium 7.9 (*)    All other components within normal limits  CBC - Abnormal; Notable for the following components:   WBC 3.0 (*)    Hemoglobin 11.8 (*)    All other components within normal limits  TROPONIN I (HIGH SENSITIVITY)  TROPONIN I (HIGH SENSITIVITY)    EKG None  Radiology DG Chest 1 View  Result Date: 11/02/2022 CLINICAL DATA:  Chest pain EXAM: CHEST  1 VIEW COMPARISON:  04/12/22 CXR FINDINGS: No pleural effusion. No pneumothorax. Normal cardiac and mediastinal contours. No focal airspace opacity. No displaced rib fractures. Visualized upper abdomen is unremarkable. IMPRESSION: No radiographic finding to explain chest pain. Electronically Signed   By: Marin Roberts M.D.   On: 11/02/2022 10:57    Procedures Procedures  {Document cardiac monitor, telemetry assessment procedure when appropriate:1}  Medications Ordered in ED Medications  acetaminophen (TYLENOL) tablet 1,000 mg (1,000 mg Oral Given 11/02/22 8341)    ED Course/ Medical Decision Making/ A&P   {   Click here for ABCD2, HEART and other calculatorsREFRESH Note before signing :1}  Medical Decision Making  ***  {Document critical care time when appropriate:1} {Document review of labs and clinical decision tools ie heart score, Chads2Vasc2 etc:1}  {Document your independent review of radiology images, and any outside records:1} {Document your discussion with family members, caretakers, and with consultants:1} {Document social determinants of health affecting pt's care:1} {Document your decision making why or why not admission, treatments were needed:1} Final Clinical Impression(s) / ED Diagnoses Final diagnoses:  None    Rx / DC Orders ED Discharge Orders     None

## 2022-11-02 NOTE — ED Provider Triage Note (Signed)
Emergency Medicine Provider Triage Evaluation Note  Jennifer Leach , a 42 y.o. female  was evaluated in triage.  Pt complains of chest pain.  Started on Tuesday.  Chest pain is midsternal and nonradiating.  Also nonexertional.  Endorses some shortness of breath.  Also endorsing productive cough and chills.  But no fever.  States coworker has had COVID here recently.  Review of Systems  Positive: See above Negative: See above  Physical Exam  BP 115/70 (BP Location: Right Arm)   Pulse 96   Temp (!) 102.1 F (38.9 C) (Oral)   Resp 16   Ht 5\' 6"  (1.676 m)   Wt 68 kg   LMP 10/25/2022 (Exact Date)   SpO2 92%   BMI 24.21 kg/m  Gen:   Awake, no distress   Resp:  Normal effort  MSK:   Moves extremities without difficulty  Other:    Medical Decision Making  Medically screening exam initiated at 9:35 AM.  Appropriate orders placed.  RENATHA ROSEN was informed that the remainder of the evaluation will be completed by another provider, this initial triage assessment does not replace that evaluation, and the importance of remaining in the ED until their evaluation is complete.  Work up started   Harriet Pho, [place] 11/02/22 [phone]

## 2022-11-03 ENCOUNTER — Telehealth: Payer: Self-pay

## 2022-11-03 NOTE — Progress Notes (Unsigned)
HPI: [name] is a 42 y.o. female, who is here today for follow up.  She was seen on 09/25/2022, when she was complaining of anxiety,depression, and insomnia. Currently she is on Lexapro 10 mg daily, started last visit.She stopped medication after a few days because did not like the way she felt. She is not interested in trying a different agent.  Today she is c/o persistent productive cough,fatigue, and rhinorrhea/nasal congestion. Negative for hemoptysis. Evaluated in the ED on 11/02/22 for respiratory symptoms and CP, she was Dx'ed with influenza B. Because > 48 hours of symptoms onset she was not offered antiviral medication. Lab Results  Component Value Date   WBC 3.0 (L) 11/02/2022   HGB 11.8 (L) 11/02/2022   HCT 36.4 11/02/2022   MCV 93.8 11/02/2022   PLT 161 11/02/2022  EKG: NSR. Troponin x 2 wnl CXR negative.  Lab Results  Component Value Date   CREATININE 0.91 11/02/2022   BUN 10 11/02/2022   NA 133 (L) 11/02/2022   K 3.5 11/02/2022   CL 102 11/02/2022   CO2 19 (L) 11/02/2022   She was prescribed Tussionex but her pharmacy did not have medication. Cough is affecting her sleep. She is taking OTC cold medications. Negative for fever,chills,CP,dyspnea,or wheezing.  Review of Systems  Constitutional:  Positive for fatigue.  Cardiovascular:  Negative for leg swelling.  Gastrointestinal:  Negative for abdominal pain, nausea and vomiting.  Genitourinary:  Negative for decreased urine volume, dysuria and hematuria.  Skin:  Negative for rash.  Neurological:  Negative for syncope and facial asymmetry.  Hematological:  Negative for adenopathy. Does not bruise/bleed easily.  Psychiatric/Behavioral:  Positive for sleep disturbance. Negative for confusion. The patient is nervous/anxious.   See other pertinent positives and negatives in HPI.  Current Outpatient Medications on File Prior to Visit  Medication Sig Dispense Refill   ibuprofen (ADVIL) 800 MG tablet  Take 1 tablet (800 mg total) by mouth every 8 (eight) hours as needed. Take every 8 hours on schedule during menstruation , monthly. 30 tablet 5   metroNIDAZOLE (METROGEL) 0.75 % vaginal gel Place 1 Applicatorful vaginally 2 (two) times daily. 70 g 5   No current facility-administered medications on file prior to visit.   Past Medical History:  Diagnosis Date   Bacterial vaginitis 03/04/2017   Gonorrhea    Labial swelling 03/04/2017   MRSA (methicillin resistant staph aureus) culture positive 08/06/2014   Allergies  Allergen Reactions   Septra Ds [Sulfamethoxazole-Trimethoprim] Anaphylaxis, Swelling, Rash and Other (See Comments)    Reaction:  Facial swelling    Social History   Socioeconomic History   Marital status: Single    Spouse name: Not on file   Number of children: Not on file   Years of education: Not on file   Highest education level: Not on file  Occupational History   Not on file  Tobacco Use   Smoking status: Every Day    Packs/day: 0.25    Years: 9.00    Total pack years: 2.25    Types: Cigarettes    Last attempt to quit: 06/09/2012    Years since quitting: 10.4   Smokeless tobacco: Never   Tobacco comments:    pt reports is is smoking   Vaping Use   Vaping Use: Never used  Substance and Sexual Activity   Alcohol use: No   Drug use: No   Sexual activity: Yes    Partners: Male    Birth control/protection: Surgical  Other Topics Concern   Not on file  Social History Narrative   Not on file   Social Determinants of Health   Financial Resource Strain: Not on file  Food Insecurity: Not on file  Transportation Needs: Not on file  Physical Activity: Not on file  Stress: Not on file  Social Connections: Not on file   Vitals:   11/06/22 1025  BP: 104/80  Pulse: 76  Resp: 12  Temp: 97.6 F (36.4 C)  SpO2: 98%  Body mass index is 25.87 kg/m.  Physical Exam Vitals and nursing note reviewed.  Constitutional:      General: She is not in acute  distress.    Appearance: She is well-developed. She is not ill-appearing.  HENT:     Head: Normocephalic and atraumatic.     Right Ear: Tympanic membrane, ear canal and external ear normal.     Left Ear: Tympanic membrane, ear canal and external ear normal.     Nose: Rhinorrhea present.     Right Sinus: No maxillary sinus tenderness or frontal sinus tenderness.     Left Sinus: No maxillary sinus tenderness or frontal sinus tenderness.     Mouth/Throat:     Mouth: Mucous membranes are moist.  Eyes:     Conjunctiva/sclera: Conjunctivae normal.  Cardiovascular:     Rate and Rhythm: Normal rate and regular rhythm.     Heart sounds: No murmur heard. Pulmonary:     Effort: Pulmonary effort is normal. No respiratory distress.     Breath sounds: No stridor. Wheezing present. No decreased breath sounds, rhonchi or rales.  Musculoskeletal:     Cervical back: No edema or erythema. No muscular tenderness.  Lymphadenopathy:     Cervical: No cervical adenopathy.  Skin:    General: Skin is warm.     Findings: No erythema or rash.  Neurological:     Mental Status: She is alert and oriented to person, place, and time.  Psychiatric:        Mood and Affect: Mood is anxious.     Comments: Well groomed, good eye contact.   ASSESSMENT AND PLAN:  Ms. Jennifer Leach was seen today for medical management of chronic issues.  Diagnoses and all orders for this visit:  Reactive airway disease without asthma CXR on 11/02/22 negative. Lung auscultation negative for rales or rhonchi, so I do not think CXR needs to be repeated. Albuterol inh 2 puff every 6 hours for a week then as needed for wheezing or shortness of breath.  Prednisone 40 mg daily x 3, side effects discussed. Monitor for fever. Instructed about warning signs.  -     predniSONE (DELTASONE) 20 MG tablet; Take 2 tablets (40 mg total) by mouth daily with breakfast for 3 days. -     albuterol (VENTOLIN HFA) 108 (90 Base) MCG/ACT inhaler; Inhale 2  puffs into the lungs every 6 (six) hours as needed for wheezing or shortness of breath.  Influenza B Educated about Dx and treatment options. Continue adequate fluid intake. Explained that cough and congestion can last a few more days and even weeks.  Acute cough Symptomatic treatment with Hycodan q 12 hours, prn, mainly at bedtime, so she can rest at night.  -     HYDROcodone bit-homatropine (HYCODAN) 5-1.5 MG/5ML syrup; Take 5 mLs by mouth every 12 (twelve) hours as needed for up to 10 days for cough.  Depression, major, recurrent, moderate (HCC) She is not interested in pharmacologic treatment. Consider CBT.  GAD (  generalized anxiety disorder) She doe snot want to try a different medication. She did not like how she felt when taking Lexapro. Recommend trying CBT.  Return in about 2 weeks (around 11/20/2022), or if symptoms worsen or fail to improve.  Hasson Gaspard G. Martinique, MD  [facility]. [place] office.

## 2022-11-03 NOTE — Telephone Encounter (Signed)
Transition Care Management Unsuccessful Follow-up Telephone Call  Date of discharge and from where:  11/02/22 [facility]  Attempts:  1st Attempt  Reason for unsuccessful TCM follow-up call:  Voice mail full

## 2022-11-06 ENCOUNTER — Ambulatory Visit: Payer: Medicaid Other | Admitting: Family Medicine

## 2022-11-06 ENCOUNTER — Encounter: Payer: Self-pay | Admitting: Family Medicine

## 2022-11-06 VITALS — BP 104/80 | HR 76 | Temp 97.6°F | Resp 12 | Ht 66.0 in | Wt 160.2 lb

## 2022-11-06 DIAGNOSIS — F331 Major depressive disorder, recurrent, moderate: Secondary | ICD-10-CM

## 2022-11-06 DIAGNOSIS — J101 Influenza due to other identified influenza virus with other respiratory manifestations: Secondary | ICD-10-CM | POA: Diagnosis not present

## 2022-11-06 DIAGNOSIS — F411 Generalized anxiety disorder: Secondary | ICD-10-CM | POA: Diagnosis not present

## 2022-11-06 DIAGNOSIS — J989 Respiratory disorder, unspecified: Secondary | ICD-10-CM

## 2022-11-06 DIAGNOSIS — R051 Acute cough: Secondary | ICD-10-CM

## 2022-11-06 MED ORDER — PREDNISONE 20 MG PO TABS: 40.0000 mg | ORAL_TABLET | Freq: Every day | ORAL | 0 refills | Status: AC

## 2022-11-06 MED ORDER — ALBUTEROL SULFATE HFA 108 (90 BASE) MCG/ACT IN AERS: 2.0000 | INHALATION_SPRAY | Freq: Four times a day (QID) | RESPIRATORY_TRACT | 0 refills | Status: DC | PRN

## 2022-11-06 MED ORDER — HYDROCODONE BIT-HOMATROP MBR 5-1.5 MG/5ML PO SOLN: 5.0000 mL | Freq: Two times a day (BID) | ORAL | 0 refills | Status: AC | PRN

## 2022-11-06 NOTE — Assessment & Plan Note (Signed)
She doe snot want to try a different medication. She did not like how she felt when taking Lexapro. Recommend trying CBT.

## 2022-11-06 NOTE — Assessment & Plan Note (Signed)
She is not interested in pharmacologic treatment. Consider CBT.

## 2022-11-06 NOTE — Telephone Encounter (Signed)
Transition Care Management Unsuccessful Follow-up Telephone Call  Date of discharge and from where:  11/02/22 [facility]  Attempts:  2nd Attempt  Reason for unsuccessful TCM follow-up call:  Left voice message

## 2022-11-06 NOTE — Patient Instructions (Addendum)
A few things to remember from today's visit:  Reactive airway disease without asthma - Plan: predniSONE (DELTASONE) 20 MG tablet, albuterol (VENTOLIN HFA) 108 (90 Base) MCG/ACT inhaler  GAD (generalized anxiety disorder)  Depression, major, recurrent, moderate (HCC), Chronic  Influenza A  Acute cough - Plan: HYDROcodone bit-homatropine (HYCODAN) 5-1.5 MG/5ML syrup  Cough and congestion can last a few more weeks. Monitor for fever. Hycodan at bedtime for cough. Albuterol inh 2 puff every 6 hours for a week then as needed for wheezing or shortness of breath.  Prednisone with breakfast.  In regard to anxiety, if you do not want medication you can establish with psychotherapist.  If you need refills for medications you take chronically, please call your pharmacy. Do not use My Chart to request refills or for acute issues that need immediate attention. If you send a my chart message, it may take a few days to be addressed, specially if I am not in the office.  Please be sure medication list is accurate. If a new problem present, please set up appointment sooner than planned today.

## 2022-11-09 NOTE — Telephone Encounter (Signed)
Transition Care Management Unsuccessful Follow-up Telephone Call  Date of discharge and from where:   11/02/22 [facility]   Attempts:  3rd Attempt  Reason for unsuccessful TCM follow-up call:  Unable to reach patient

## 2023-01-04 ENCOUNTER — Ambulatory Visit (HOSPITAL_COMMUNITY)
Admission: RE | Admit: 2023-01-04 | Discharge: 2023-01-04 | Disposition: A | Discharge status: Home / Self Care | Payer: Medicaid Other | Source: Ambulatory Visit | Attending: Family Medicine | Admitting: Family Medicine

## 2023-01-04 ENCOUNTER — Encounter (HOSPITAL_COMMUNITY): Payer: Self-pay

## 2023-01-04 VITALS — BP 130/85 | HR 69 | Temp 98.2°F | Resp 18

## 2023-01-04 DIAGNOSIS — L732 Hidradenitis suppurativa: Secondary | ICD-10-CM | POA: Diagnosis not present

## 2023-01-04 DIAGNOSIS — L02411 Cutaneous abscess of right axilla: Secondary | ICD-10-CM | POA: Diagnosis not present

## 2023-01-04 MED ORDER — HYDROCODONE-ACETAMINOPHEN 5-325 MG PO TABS: 1.0000 | ORAL_TABLET | Freq: Four times a day (QID) | ORAL | 0 refills | Status: DC | PRN

## 2023-01-04 MED ORDER — DOXYCYCLINE HYCLATE 100 MG PO CAPS: 100.0000 mg | ORAL_CAPSULE | Freq: Two times a day (BID) | ORAL | 0 refills | Status: DC

## 2023-01-04 MED ORDER — MORPHINE SULFATE (PF) 2 MG/ML IV SOLN
4.0000 mg | Freq: Once | INTRAVENOUS | Status: AC
  Administered 2023-01-04: 4 mg via INTRAMUSCULAR

## 2023-01-04 MED ORDER — MORPHINE SULFATE (PF) 2 MG/ML IV SOLN: 4.0000 mg | Freq: Once | INTRAVENOUS | Status: DC

## 2023-01-04 MED ORDER — MORPHINE SULFATE (PF) 2 MG/ML IV SOLN
INTRAVENOUS | Status: AC
  Filled 2023-01-04: qty 2

## 2023-01-04 NOTE — ED Provider Notes (Signed)
[facility]   [id number] 01/04/23 Arrival Time: [id number]  ASSESSMENT & PLAN:  1. Abscess of axilla, right   2. Hidradenitis suppurativa of right axilla     Incision and Drainage Procedure Note  Anesthesia: 1% lidocaine with epinephrine  Procedure Details  The procedure, risks and complications have been discussed in detail (including, but not limited to pain and bleeding) with the patient.  The skin induration was prepped and draped in the usual fashion. After adequate local anesthesia, I&D with a #11 blade was performed on the right axilla with copious, purulent drainage.  EBL: minimal Drains: none Packing: 1/2 " iodoform Condition: Tolerated procedure well Complications: none.  Meds ordered this encounter  Medications   morphine (PF) 2 MG/ML injection 4 mg   doxycycline (VIBRAMYCIN) 100 MG capsule    Sig: Take 1 capsule (100 mg total) by mouth 2 (two) times daily.    Dispense:  14 capsule    Refill:  0   HYDROcodone-acetaminophen (NORCO/VICODIN) 5-325 MG tablet    Sig: Take 1 tablet by mouth every 6 (six) hours as needed for moderate pain or severe pain.    Dispense:  8 tablet    Refill:  0   Wound care instructions discussed and given in written format. To return in 48 hours for wound check. Work note provided. Finish all antibiotics. OTC analgesics as needed.  Reviewed expectations re: course of current medical issues. Questions answered. Outlined signs and symptoms indicating need for more acute intervention. Patient verbalized understanding. After Visit Summary given.   SUBJECTIVE:  Jennifer Leach is a 42 y.o. female who presents with a possible infection of her R axilla; chronic with acute exacerbation beginning last week. No active drainage or bleeding. Symptoms have gradually worsened since beginning. Fever: denied. No tx PTA.   OBJECTIVE:  Vitals:   01/04/23 1434  BP: 130/85  Pulse: 69  Resp: 18  Temp: 98.2 F (36.8 C)  TempSrc: Oral   SpO2: 97%     General appearance: alert; no distress R axilla: approx 1 x 2 cm induration of her R axilla with overlying erythema; tender to touch; no active drainage or bleeding Psychological: alert and cooperative; normal mood and affect  Allergies  Allergen Reactions   Septra Ds [Sulfamethoxazole-Trimethoprim] Anaphylaxis, Swelling, Rash and Other (See Comments)    Reaction:  Facial swelling     Past Medical History:  Diagnosis Date   Bacterial vaginitis 03/04/2017   Gonorrhea    Labial swelling 03/04/2017   MRSA (methicillin resistant staph aureus) culture positive 08/06/2014   Social History   Socioeconomic History   Marital status: Single    Spouse name: Not on file   Number of children: Not on file   Years of education: Not on file   Highest education level: Not on file  Occupational History   Not on file  Tobacco Use   Smoking status: Every Day    Packs/day: 0.25    Years: 9.00    Additional pack years: 0.00    Total pack years: 2.25    Types: Cigarettes    Last attempt to quit: 06/09/2012    Years since quitting: 10.5   Smokeless tobacco: Never   Tobacco comments:    pt reports is is smoking   Vaping Use   Vaping Use: Never used  Substance and Sexual Activity   Alcohol use: No   Drug use: No   Sexual activity: Yes    Partners: Male    Birth  control/protection: Surgical  Other Topics Concern   Not on file  Social History Narrative   Not on file   Social Determinants of Health   Financial Resource Strain: Not on file  Food Insecurity: Not on file  Transportation Needs: Not on file  Physical Activity: Not on file  Stress: Not on file  Social Connections: Not on file   Family History  Problem Relation Age of Onset   Hypertension Mother    Past Surgical History:  Procedure Laterality Date   TUBAL LIGATION N/A 02/19/2017   Procedure: POST PARTUM TUBAL LIGATION;  Surgeon: Chancy Milroy, MD;  Location: [place];  Service: Gynecology;   Laterality: N/A;   WISDOM TOOTH EXTRACTION              Vanessa Kick, MD 01/04/23 1640

## 2023-01-04 NOTE — Discharge Instructions (Addendum)
Be aware, you have been prescribed pain medications that may cause drowsiness. While taking this medication, do not take any other medications containing acetaminophen (Tylenol). Do not combine with alcohol or recreational. Please do not drive, operate heavy machinery, or take part in activities that require making important decisions while on this medication as your judgement may be clouded.

## 2023-01-04 NOTE — ED Triage Notes (Signed)
Pt states she has a boil under her right arm x 4 days. She denies any drainage. She has tried steam, salt, hot rags without relief.

## 2023-01-22 ENCOUNTER — Other Ambulatory Visit: Payer: Self-pay

## 2023-01-22 ENCOUNTER — Encounter (HOSPITAL_COMMUNITY): Payer: Self-pay | Admitting: Emergency Medicine

## 2023-01-22 ENCOUNTER — Ambulatory Visit (HOSPITAL_COMMUNITY)
Admission: EM | Admit: 2023-01-22 | Discharge: 2023-01-22 | Disposition: A | Discharge status: Home / Self Care | Payer: Medicaid Other | Attending: Internal Medicine | Admitting: Internal Medicine

## 2023-01-22 DIAGNOSIS — R102 Pelvic and perineal pain: Secondary | ICD-10-CM | POA: Diagnosis present

## 2023-01-22 LAB — POCT URINALYSIS DIPSTICK, ED / UC
Bilirubin Urine: NEGATIVE
Glucose, UA: NEGATIVE mg/dL
Hgb urine dipstick: NEGATIVE
Ketones, ur: NEGATIVE mg/dL
Nitrite: NEGATIVE
Protein, ur: NEGATIVE mg/dL
Specific Gravity, Urine: 1.02 (ref 1.005–1.030)
Urobilinogen, UA: 0.2 mg/dL (ref 0.0–1.0)
pH: 7 (ref 5.0–8.0)

## 2023-01-22 LAB — POC URINE PREG, ED: Preg Test, Ur: NEGATIVE

## 2023-01-22 MED ORDER — METRONIDAZOLE 500 MG PO TABS: 500.0000 mg | ORAL_TABLET | Freq: Two times a day (BID) | ORAL | 0 refills | Status: DC

## 2023-01-22 MED ORDER — LIDOCAINE HCL (PF) 1 % IJ SOLN
INTRAMUSCULAR | Status: AC
  Filled 2023-01-22: qty 2

## 2023-01-22 MED ORDER — CEFTRIAXONE SODIUM 1 G IJ SOLR
INTRAMUSCULAR | Status: AC
  Filled 2023-01-22: qty 10

## 2023-01-22 MED ORDER — DOXYCYCLINE HYCLATE 100 MG PO CAPS: 100.0000 mg | ORAL_CAPSULE | Freq: Two times a day (BID) | ORAL | 0 refills | Status: DC

## 2023-01-22 MED ORDER — CEFTRIAXONE SODIUM 1 G IJ SOLR
1.0000 g | Freq: Once | INTRAMUSCULAR | Status: AC
  Administered 2023-01-22: 1 g via INTRAMUSCULAR

## 2023-01-22 NOTE — ED Triage Notes (Signed)
Patient presents to [facility] for evaluation of 2 days of pelvic pain, worsening.  Patient states maybe some dysuria yesterday, but none today.  Has a hx of BV and would also like STI check

## 2023-01-22 NOTE — Discharge Instructions (Signed)
Your swab was sent to the lab for further testing.  You will be called with results. Doxycycline and Flagyl are antibiotics given to treat pelvic inflammatory disease. Take the prescriptions as directed.    You should avoid all sexual activity until you have been notified of all your results and have undergone any necessary treatment.  If you are positive, it is recommended that you inform all sexual partners so they can treat be treated as well before having sex again.   You were given an injection (Rocephin) for your pain today. This will help with the pain and infection.   Please noted our office is limited in the tests and imaging we can perform at our facility. Your evaluation was not suggestive of any emergent condition requiring medical intervention at this time. However, some abdominal problems make take more time to appear. Therefore, it is very important for you to pay attention to any new symptoms or worsening of your current condition.   Please go directly to the Emergency Department immediately should you begin to feel worse in any way or have any of the following symptoms: increasing or different abdominal pain, persistent vomiting, or fevers.

## 2023-01-22 NOTE — ED Provider Notes (Addendum)
[facility]   Note:  This document was prepared using Dragon voice recognition software and may include unintentional dictation errors.  MRN: [id number] DOB: 04-Feb-1981 DATE: 01/22/23   Subjective:  Chief Complaint:  Chief Complaint  Patient presents with   Pelvic Pain     HPI: Jennifer Leach is a 42 y.o. female presenting for pelvic pain for the past 2 days.  Patient reports bilateral suprapubic pain starting yesterday.  She is concerned about STD given questionable sexual partner about a week ago.  She states the sex was unprotected.  She denies any abnormal vaginal discharge currently.  She states she is having slightly more discharge than normal. Denies vaginal odor.  She states she is supposed to start her period this week.  Denies fever, nausea/vomiting, vaginal lesions, vaginal discharge, vaginal odor. Endorses abdominal pain, slight dysuria.  Per the chart, she does have history of gonorrhea.  Presents NAD.  Prior to Admission medications   Not on File     Allergies  Allergen Reactions   Septra Ds [Sulfamethoxazole-Trimethoprim] Anaphylaxis, Swelling, Rash and Other (See Comments)    Reaction:  Facial swelling     History:   Past Medical History:  Diagnosis Date   Bacterial vaginitis 03/04/2017   Gonorrhea    Labial swelling 03/04/2017   MRSA (methicillin resistant staph aureus) culture positive 08/06/2014     Past Surgical History:  Procedure Laterality Date   TUBAL LIGATION N/A 02/19/2017   Procedure: POST PARTUM TUBAL LIGATION;  Surgeon: [name], MD;  Location: [facility];  Service: Gynecology;  Laterality: N/A;   WISDOM TOOTH EXTRACTION      Family History  Problem Relation Age of Onset   Hypertension Mother     Social History   Tobacco Use   Smoking status: Every Day    Packs/day: 0.25    Years: 9.00    Additional pack years: 0.00    Total pack years: 2.25    Types: Cigarettes    Last attempt to quit: 06/09/2012    Years  since quitting: 10.6   Smokeless tobacco: Never   Tobacco comments:    pt reports is is smoking   Vaping Use   Vaping Use: Never used  Substance Use Topics   Alcohol use: No   Drug use: No    Review of Systems  Constitutional:  Negative for fever.  Gastrointestinal:  Positive for abdominal pain. Negative for nausea and vomiting.  Genitourinary:  Positive for dysuria, pelvic pain and vaginal pain. Negative for flank pain, genital sores and vaginal discharge.     Objective:   Vitals: BP 128/84 (BP Location: Right Arm)   Pulse 77   Temp 98.2 F (36.8 C) (Oral)   Resp 16   LMP 12/22/2022 (Approximate)   SpO2 95%   Physical Exam Exam conducted with a chaperone present.  Constitutional:      General: She is not in acute distress.    Appearance: Normal appearance. She is well-developed and normal weight. She is not ill-appearing or toxic-appearing.  HENT:     Head: Normocephalic and atraumatic.  Cardiovascular:     Rate and Rhythm: Normal rate and regular rhythm.     Heart sounds: Normal heart sounds.  Pulmonary:     Effort: Pulmonary effort is normal.     Breath sounds: Normal breath sounds.     Comments: Clear to auscultation bilaterally  Abdominal:     General: Bowel sounds are normal.  Palpations: Abdomen is soft.     Tenderness: There is abdominal tenderness in the suprapubic area. There is no right CVA tenderness or left CVA tenderness.     Hernia: No hernia is present.  Genitourinary:    General: Normal vulva.     Labia:        Right: No lesion.        Left: No lesion.      Vagina: Normal.     Cervix: Normal.     Adnexa:        Right: No tenderness.         Left: No tenderness.    Lymphadenopathy:     Lower Body: No right inguinal adenopathy. No left inguinal adenopathy.  Skin:    General: Skin is warm and dry.  Neurological:     General: No focal deficit present.     Mental Status: She is alert.  Psychiatric:        Mood and Affect: Mood and  affect normal.     Results:  Labs: Results for orders placed or performed during the hospital encounter of 01/22/23 (from the past 24 hour(s))  POCT Urinalysis Dipstick (ED/UC)     Status: Abnormal   Collection Time: 01/22/23 10:17 AM  Result Value Ref Range   Glucose, UA NEGATIVE NEGATIVE mg/dL   Bilirubin Urine NEGATIVE NEGATIVE   Ketones, ur NEGATIVE NEGATIVE mg/dL   Specific Gravity, Urine 1.020 1.005 - 1.030   Hgb urine dipstick NEGATIVE NEGATIVE   pH 7.0 5.0 - 8.0   Protein, ur NEGATIVE NEGATIVE mg/dL   Urobilinogen, UA 0.2 0.0 - 1.0 mg/dL   Nitrite NEGATIVE NEGATIVE   Leukocytes,Ua TRACE (A) NEGATIVE  POC urine preg, ED (not at [facility])     Status: None   Collection Time: 01/22/23 10:23 AM  Result Value Ref Range   Preg Test, Ur NEGATIVE NEGATIVE    Radiology: No results found.   UC Course/Treatments:  Procedures: Procedures   Medications Ordered in UC: Medications  cefTRIAXone (ROCEPHIN) injection 1 g (has no administration in time range)     Assessment and Plan :     ICD-10-CM   1. Acute pain in female pelvis  R10.2       Acute Pain in Female Pelvis: Afebrile, nontoxic-appearing, NAD. VSS. DDX includes but not limited to: PID, STD, BV, yeast infection, ovarian cyst, ovarian torsion UA was unremarkable except for trace amount of leukocytes today in office.  Urine culture is pending.  Cytology is pending as well.  On PE, pelvic exam was unremarkable except for tenderness to palpation of adnexa.  Concerned for PID given sudden onset pelvic pain with history of unprotected sex with new partner.  No abnormal discharge noted on PE; however, Doxycycline 100 mg twice daily and Flagyl 500 mg twice daily were prescribed to empirically treat PID.  Rocephin 1 g IM was given in office as well.  Safe sex precautions advised.  Recommend over-the-counter analgesics as needed for pain. Strict ED precautions were given and patient verbalized understanding.   ED Discharge Orders           Ordered    doxycycline (VIBRAMYCIN) 100 MG capsule  2 times daily        01/22/23 1109    metroNIDAZOLE (FLAGYL) 500 MG tablet  Every 12 hours        01/22/23 1109             I have reviewed the PDMP during this encounter.  Mclain Freer P, PA-C 01/22/23 1128    Damara Klunder P, PA-C 01/22/23 1128

## 2023-01-23 ENCOUNTER — Ambulatory Visit (HOSPITAL_COMMUNITY): Payer: Self-pay

## 2023-01-23 LAB — CERVICOVAGINAL ANCILLARY ONLY
Bacterial Vaginitis (gardnerella): POSITIVE — AB
Candida Glabrata: NEGATIVE
Candida Vaginitis: POSITIVE — AB
Chlamydia: NEGATIVE
Comment: NEGATIVE
Comment: NEGATIVE
Comment: NEGATIVE
Comment: NEGATIVE
Comment: NEGATIVE
Comment: NORMAL
Neisseria Gonorrhea: NEGATIVE
Trichomonas: NEGATIVE

## 2023-01-23 LAB — URINE CULTURE: Culture: 1000 — AB

## 2023-01-24 ENCOUNTER — Telehealth (HOSPITAL_COMMUNITY): Payer: Self-pay | Admitting: Emergency Medicine

## 2023-01-24 ENCOUNTER — Other Ambulatory Visit: Payer: Self-pay | Admitting: *Deleted

## 2023-01-24 DIAGNOSIS — Z01419 Encounter for gynecological examination (general) (routine) without abnormal findings: Secondary | ICD-10-CM

## 2023-01-24 MED ORDER — FLUCONAZOLE 150 MG PO TABS: 150.0000 mg | ORAL_TABLET | Freq: Once | ORAL | 0 refills | Status: AC

## 2023-01-24 NOTE — Progress Notes (Signed)
Screening mammo ordered for future.

## 2023-02-02 NOTE — Progress Notes (Unsigned)
   ACUTE VISIT No chief complaint on file.  HPI: [name] is a 42 y.o. female, who is here today complaining of *** HPI  Review of Systems See other pertinent positives and negatives in HPI.  Current Outpatient Medications on File Prior to Visit  Medication Sig Dispense Refill  . doxycycline (VIBRAMYCIN) 100 MG capsule Take 1 capsule (100 mg total) by mouth 2 (two) times daily for 14 days. 28 capsule 0  . metroNIDAZOLE (FLAGYL) 500 MG tablet Take 1 tablet (500 mg total) by mouth every 12 (twelve) hours for 14 days. 28 tablet 0   No current facility-administered medications on file prior to visit.    Past Medical History:  Diagnosis Date  . Bacterial vaginitis 03/04/2017  . Gonorrhea   . Labial swelling 03/04/2017  . MRSA (methicillin resistant staph aureus) culture positive 08/06/2014   Allergies  Allergen Reactions  . Septra Ds [Sulfamethoxazole-Trimethoprim] Anaphylaxis, Swelling, Rash and Other (See Comments)    Reaction:  Facial swelling     Social History   Socioeconomic History  . Marital status: Single    Spouse name: Not on file  . Number of children: Not on file  . Years of education: Not on file  . Highest education level: Not on file  Occupational History  . Not on file  Tobacco Use  . Smoking status: Every Day    Packs/day: 0.25    Years: 9.00    Additional pack years: 0.00    Total pack years: 2.25    Types: Cigarettes    Last attempt to quit: 06/09/2012    Years since quitting: 10.6  . Smokeless tobacco: Never  . Tobacco comments:    pt reports is is smoking   Vaping Use  . Vaping Use: Never used  Substance and Sexual Activity  . Alcohol use: No  . Drug use: No  . Sexual activity: Yes    Partners: Male    Birth control/protection: Surgical  Other Topics Concern  . Not on file  Social History Narrative  . Not on file   Social Determinants of Health   Financial Resource Strain: Not on file  Food Insecurity: Not on file   Transportation Needs: Not on file  Physical Activity: Not on file  Stress: Not on file  Social Connections: Not on file    There were no vitals filed for this visit. There is no height or weight on file to calculate BMI.  Physical Exam  ASSESSMENT AND PLAN: There are no diagnoses linked to this encounter.  No follow-ups on file.  Cheskel Silverio G. [place], MD  [facility]. [place] office.  Discharge Instructions   None

## 2023-02-05 ENCOUNTER — Ambulatory Visit: Payer: Medicaid Other | Admitting: Family Medicine

## 2023-02-05 ENCOUNTER — Ambulatory Visit (INDEPENDENT_AMBULATORY_CARE_PROVIDER_SITE_OTHER): Payer: Medicaid Other

## 2023-02-05 ENCOUNTER — Encounter: Payer: Self-pay | Admitting: Family Medicine

## 2023-02-05 VITALS — BP 126/80 | HR 67 | Temp 97.9°F | Resp 12 | Ht 66.0 in | Wt 162.5 lb

## 2023-02-05 DIAGNOSIS — R079 Chest pain, unspecified: Secondary | ICD-10-CM | POA: Diagnosis not present

## 2023-02-05 DIAGNOSIS — F17210 Nicotine dependence, cigarettes, uncomplicated: Secondary | ICD-10-CM | POA: Diagnosis not present

## 2023-02-05 DIAGNOSIS — M545 Low back pain, unspecified: Secondary | ICD-10-CM | POA: Diagnosis not present

## 2023-02-05 DIAGNOSIS — F17200 Nicotine dependence, unspecified, uncomplicated: Secondary | ICD-10-CM

## 2023-02-05 DIAGNOSIS — G8929 Other chronic pain: Secondary | ICD-10-CM

## 2023-02-05 MED ORDER — NICOTINE 14 MG/24HR TD PT24
14.0000 mg | MEDICATED_PATCH | Freq: Every day | TRANSDERMAL | 0 refills | Status: DC
Start: 2023-02-05 — End: 2023-04-21

## 2023-02-05 MED ORDER — MELOXICAM 15 MG PO TABS
ORAL_TABLET | ORAL | 0 refills | Status: DC
Start: 2023-02-05 — End: 2023-04-21

## 2023-02-05 NOTE — Patient Instructions (Addendum)
A few things to remember from today's visit:  Tobacco use disorder - Plan: nicotine (NICODERM CQ - DOSED IN MG/24 HOURS) 14 mg/24hr patch  Chronic bilateral low back pain, unspecified whether sciatica present - Plan: DG Lumbar Spine Complete, meloxicam (MOBIC) 15 MG tablet, Ambulatory referral to Physical Therapy  Right-sided chest pain - Plan: meloxicam (MOBIC) 15 MG tablet  Chest pain and lower back pain seem musculoskeletal pain. Meloxicam daily for 10 days then as needed for 20 more days. PT will be arranged.  Do not use My Chart to request refills or for acute issues that need immediate attention. If you send a my chart message, it may take a few days to be addressed, specially if I am not in the office.  Please be sure medication list is accurate. If a new problem present, please set up appointment sooner than planned today.

## 2023-02-06 ENCOUNTER — Other Ambulatory Visit: Payer: Medicaid Other

## 2023-02-13 ENCOUNTER — Ambulatory Visit: Payer: Medicaid Other

## 2023-02-15 ENCOUNTER — Other Ambulatory Visit: Payer: Self-pay

## 2023-02-15 ENCOUNTER — Ambulatory Visit (HOSPITAL_COMMUNITY)
Admission: EM | Admit: 2023-02-15 | Discharge: 2023-02-15 | Disposition: A | Discharge status: Home / Self Care | Payer: Medicaid Other | Attending: Family Medicine | Admitting: Family Medicine

## 2023-02-15 ENCOUNTER — Encounter (HOSPITAL_COMMUNITY): Payer: Self-pay | Admitting: *Deleted

## 2023-02-15 DIAGNOSIS — J069 Acute upper respiratory infection, unspecified: Secondary | ICD-10-CM

## 2023-02-15 DIAGNOSIS — J040 Acute laryngitis: Secondary | ICD-10-CM | POA: Diagnosis not present

## 2023-02-15 MED ORDER — PROMETHAZINE-DM 6.25-15 MG/5ML PO SYRP: 5.0000 mL | ORAL_SOLUTION | Freq: Four times a day (QID) | ORAL | 0 refills | Status: DC | PRN

## 2023-02-15 NOTE — ED Triage Notes (Signed)
Pt reports she has had a cough ,sore throat and is hoarse.

## 2023-02-15 NOTE — ED Provider Notes (Signed)
[facility]   [id number] 02/15/23 Arrival Time: 1947  ASSESSMENT & PLAN:  1. Viral URI with cough   2. Laryngitis, acute    Discussed typical duration of likely viral illness. OTC symptom care as needed.  Discharge Medication List as of 02/15/2023  8:17 PM     START taking these medications   Details  promethazine-dextromethorphan (PROMETHAZINE-DM) 6.25-15 MG/5ML syrup Take 5 mLs by mouth 4 (four) times daily as needed for cough., Starting Thu 02/15/2023, Normal       Work note provided.   Follow-up Information     [place], Betty G, MD.   Specialty: Family Medicine Why: If worsening or failing to improve as anticipated. Contact information: [address] [place] [address] [phone]                 Reviewed expectations re: course of current medical issues. Questions answered. Outlined signs and symptoms indicating need for more acute intervention. Understanding verbalized. After Visit Summary given.   SUBJECTIVE: History from: Patient. Jennifer Leach is a 41 y.o. female. Reports: Pt reports she has had a cough ,sore throat and is hoarse. Few days. Denies: fever and difficulty breathing. Normal PO intake without n/v/d.  Social History   Tobacco Use  Smoking Status Every Day   Packs/day: 0.25   Years: 9.00   Additional pack years: 0.00   Total pack years: 2.25   Types: Cigarettes   Last attempt to quit: 06/09/2012   Years since quitting: 10.6  Smokeless Tobacco Never  Tobacco Comments   pt reports is is smoking     OBJECTIVE:  Vitals:   02/15/23 2009  BP: 128/89  Pulse: 69  Resp: 18  Temp: 98.1 F (36.7 C)  SpO2: 98%    General appearance: alert; no distress Eyes: PERRLA; EOMI; conjunctiva normal HENT: [place]; AT; with nasal congestion; throat cobblestoning; hoarse Neck: supple  Lungs: speaks full sentences without difficulty; unlabored Extremities: no edema Skin: warm and dry Neurologic: normal  gait Psychological: alert and cooperative; normal mood and affect  Allergies  Allergen Reactions   Septra Ds [Sulfamethoxazole-Trimethoprim] Anaphylaxis, Swelling, Rash and Other (See Comments)    Reaction:  Facial swelling     Past Medical History:  Diagnosis Date   Bacterial vaginitis 03/04/2017   Gonorrhea    Labial swelling 03/04/2017   MRSA (methicillin resistant staph aureus) culture positive 08/06/2014   Social History   Socioeconomic History   Marital status: Single    Spouse name: Not on file   Number of children: Not on file   Years of education: Not on file   Highest education level: Not on file  Occupational History   Not on file  Tobacco Use   Smoking status: Every Day    Packs/day: 0.25    Years: 9.00    Additional pack years: 0.00    Total pack years: 2.25    Types: Cigarettes    Last attempt to quit: 06/09/2012    Years since quitting: 10.6   Smokeless tobacco: Never   Tobacco comments:    pt reports is is smoking   Vaping Use   Vaping Use: Never used  Substance and Sexual Activity   Alcohol use: No   Drug use: No   Sexual activity: Yes    Partners: Male    Birth control/protection: Surgical  Other Topics Concern   Not on file  Social History Narrative   Not on file   Social Determinants of Health  Financial Resource Strain: Not on file  Food Insecurity: Not on file  Transportation Needs: Not on file  Physical Activity: Not on file  Stress: Not on file  Social Connections: Not on file  Intimate Partner Violence: Not on file   Family History  Problem Relation Age of Onset   Hypertension Mother    Past Surgical History:  Procedure Laterality Date   TUBAL LIGATION N/A 02/19/2017   Procedure: POST PARTUM TUBAL LIGATION;  Surgeon: [name], MD;  Location: [facility];  Service: Gynecology;  Laterality: N/A;   WISDOM TOOTH EXTRACTION       Mardella Layman, MD 02/15/23 2028

## 2023-02-26 NOTE — Therapy (Deleted)
OUTPATIENT PHYSICAL THERAPY THORACOLUMBAR EVALUATION   Patient Name: Jennifer Leach MRN: [id number] DOB:29-Oct-1980, 42 y.o., female Today's Date: 02/26/2023  END OF SESSION:   Past Medical History:  Diagnosis Date   Bacterial vaginitis 03/04/2017   Gonorrhea    Labial swelling 03/04/2017   MRSA (methicillin resistant staph aureus) culture positive 08/06/2014   Past Surgical History:  Procedure Laterality Date   TUBAL LIGATION N/A 02/19/2017   Procedure: POST PARTUM TUBAL LIGATION;  Surgeon: [name], MD;  Location: [facility];  Service: Gynecology;  Laterality: N/A;   WISDOM TOOTH EXTRACTION     Patient Active Problem List   Diagnosis Date Noted   Chronic bilateral low back pain 02/05/2023   GAD (generalized anxiety disorder) 09/25/2022   Insomnia 09/25/2022   Depression, major, recurrent, moderate (HCC) 09/25/2022   Breakthrough bleeding on Depo-Provera 04/14/2020   Hidradenitis 04/14/2020   Gonorrhea 11/20/2019   Abnormal uterine bleeding (AUB) 11/19/2018   H/O tubal ligation 03/15/2017   Bacterial vaginitis 03/04/2017   Labial swelling 03/04/2017    PCP: [place], Betty MD   REFERRING PROVIDER: [place], Betty MD   REFERRING DIAG:  Diagnosis  M54.50,G89.29 (ICD-10-CM) - Chronic bilateral low back pain, unspecified whether sciatica present    Rationale for Evaluation and Treatment: Rehabilitation  THERAPY DIAG:  No diagnosis found.  ONSET DATE: ***  SUBJECTIVE:                                                                                                                                                                                           SUBJECTIVE STATEMENT: ***  PERTINENT HISTORY:  Jennifer Leach is a 42 y.o. female, who is here today complaining of above symptoms.  She describes the pain as initially constant in her back, radiating down to both legs, and severe enough to prevent her from working. The pain has since improved to a  5 out of 10 on the pain scale, with no accompanying numbness, tingling in her legs,bowel/bladder dysfunction. Back Pain This is a recurrent problem. The pain is present in the lumbar spine. The quality of the pain is described as aching. The symptoms are aggravated by position and standing. Pertinent negatives include no abdominal pain, dysuria, fever, headaches, numbness, pelvic pain, perianal numbness, tingling, weakness or weight loss.  She was concerned about back pain being caused by a kidney issue. She has not taken any pain medication, only consuming cranberry juice and water for relief.     PAIN:  Are you having pain? Yes: NPRS scale: ***/10 Pain location: *** Pain description: *** Aggravating factors: *** Relieving factors: ***  PRECAUTIONS: {Therapy  precautions:24002}  WEIGHT BEARING RESTRICTIONS: No  FALLS:  Has patient fallen in last 6 months? {fallsyesno:27318}  LIVING ENVIRONMENT: Lives with: {OPRC lives with:25569::"lives with their family"} Lives in: {Lives in:25570} Stairs: {opstairs:27293} Has following equipment at home: {Assistive devices:23999}  OCCUPATION: ***  PLOF: {PLOF:24004}  PATIENT GOALS: ***  NEXT MD VISIT: ***  OBJECTIVE:   DIAGNOSTIC FINDINGS:  ***  PATIENT SURVEYS:  FOTO ***  SCREENING FOR RED FLAGS: Bowel or bladder incontinence: {Yes/No:304960894} Spinal tumors: {Yes/No:304960894} Cauda equina syndrome: {Yes/No:304960894} Compression fracture: {Yes/No:304960894} Abdominal aneurysm: {Yes/No:304960894}  COGNITION: Overall cognitive status: {cognition:24006}     SENSATION: {sensation:27233}  MUSCLE LENGTH: Hamstrings: Right *** deg; Left *** deg Thomas test: Right *** deg; Left *** deg  POSTURE: {posture:25561}  PALPATION: ***  LUMBAR ROM:   AROM eval  Flexion   Extension   Right lateral flexion   Left lateral flexion   Right rotation   Left rotation    (Blank rows = not tested)  LOWER EXTREMITY ROM:      Active  Right eval Left eval  Hip flexion    Hip extension    Hip abduction    Hip adduction    Hip internal rotation    Hip external rotation    Knee flexion    Knee extension    Ankle dorsiflexion    Ankle plantarflexion    Ankle inversion    Ankle eversion     (Blank rows = not tested)  LOWER EXTREMITY MMT:    MMT Right eval Left eval  Hip flexion    Hip extension    Hip abduction    Hip adduction    Hip internal rotation    Hip external rotation    Knee flexion    Knee extension    Ankle dorsiflexion    Ankle plantarflexion    Ankle inversion    Ankle eversion     (Blank rows = not tested)  LUMBAR SPECIAL TESTS:  {lumbar special test:25242}  FUNCTIONAL TESTS:  {Functional tests:24029}  GAIT: Distance walked: *** Assistive device utilized: {Assistive devices:23999} Level of assistance: {Levels of assistance:24026} Comments: ***  TODAY'S TREATMENT:                                                                                                                              DATE: 02/27/23    PATIENT EDUCATION:  Education details: *** Person educated: {Person educated:25204} Education method: {Education Method:25205} Education comprehension: {Education Comprehension:25206}  HOME EXERCISE PROGRAM: ***  ASSESSMENT:  CLINICAL IMPRESSION: Patient is a 42 y.o. female who was seen today for physical therapy evaluation and treatment for ***.   OBJECTIVE IMPAIRMENTS: {opptimpairments:25111}.   ACTIVITY LIMITATIONS: {activitylimitations:27494}  PARTICIPATION LIMITATIONS: {participationrestrictions:25113}  PERSONAL FACTORS: {Personal factors:25162} are also affecting patient's functional outcome.   REHAB POTENTIAL: {rehabpotential:25112}  CLINICAL DECISION MAKING: {clinical decision making:25114}  EVALUATION COMPLEXITY: {Evaluation complexity:25115}   GOALS: Goals reviewed with patient? {yes/no:20286}  SHORT TERM GOALS: Target date:  ***  ***  Baseline: Goal status: {GOALSTATUS:25110}  2.  *** Baseline:  Goal status: {GOALSTATUS:25110}  3.  *** Baseline:  Goal status: {GOALSTATUS:25110}  4.  *** Baseline:  Goal status: {GOALSTATUS:25110}  5.  *** Baseline:  Goal status: {GOALSTATUS:25110}  6.  *** Baseline:  Goal status: {GOALSTATUS:25110}  LONG TERM GOALS: Target date: ***  *** Baseline:  Goal status: {GOALSTATUS:25110}  2.  *** Baseline:  Goal status: {GOALSTATUS:25110}  3.  *** Baseline:  Goal status: {GOALSTATUS:25110}  4.  *** Baseline:  Goal status: {GOALSTATUS:25110}  5.  *** Baseline:  Goal status: {GOALSTATUS:25110}  6.  *** Baseline:  Goal status: {GOALSTATUS:25110}  PLAN:  PT FREQUENCY: {rehab frequency:25116}  PT DURATION: {rehab duration:25117}  PLANNED INTERVENTIONS: {rehab planned interventions:25118::"Therapeutic exercises","Therapeutic activity","Neuromuscular re-education","Balance training","Gait training","Patient/Family education","Self Care","Joint mobilization"}.  PLAN FOR NEXT SESSION: ***   Jestina Stephani, PT 02/26/2023, 12:08 PM   Check all possible CPT codes: {cptcodes:24818}    Check all conditions that are expected to impact treatment: {Conditions expected to impact treatment:{Conditions expected to impact treatment:28273}   If treatment provided at initial evaluation, no treatment charged due to lack of authorization.

## 2023-02-27 ENCOUNTER — Ambulatory Visit: Payer: Medicaid Other | Attending: Family Medicine | Admitting: Physical Therapy

## 2023-03-07 ENCOUNTER — Other Ambulatory Visit (HOSPITAL_COMMUNITY)
Admission: RE | Admit: 2023-03-07 | Discharge: 2023-03-07 | Disposition: A | Discharge status: Home / Self Care | Payer: Medicaid Other | Source: Ambulatory Visit | Attending: Advanced Practice Midwife | Admitting: Advanced Practice Midwife

## 2023-03-07 ENCOUNTER — Ambulatory Visit: Payer: Medicaid Other | Admitting: Advanced Practice Midwife

## 2023-03-07 ENCOUNTER — Encounter: Payer: Self-pay | Admitting: Advanced Practice Midwife

## 2023-03-07 VITALS — BP 124/78 | HR 76 | Ht 67.0 in | Wt 169.4 lb

## 2023-03-07 DIAGNOSIS — Z113 Encounter for screening for infections with a predominantly sexual mode of transmission: Secondary | ICD-10-CM | POA: Diagnosis present

## 2023-03-07 DIAGNOSIS — N898 Other specified noninflammatory disorders of vagina: Secondary | ICD-10-CM

## 2023-03-07 DIAGNOSIS — Z3042 Encounter for surveillance of injectable contraceptive: Secondary | ICD-10-CM

## 2023-03-07 DIAGNOSIS — Z3009 Encounter for other general counseling and advice on contraception: Secondary | ICD-10-CM

## 2023-03-07 LAB — POCT URINE PREGNANCY: Preg Test, Ur: NEGATIVE

## 2023-03-07 MED ORDER — MEDROXYPROGESTERONE ACETATE 150 MG/ML IM SUSP
150.0000 mg | INTRAMUSCULAR | 3 refills | Status: DC
Start: 2023-03-07 — End: 2023-04-21

## 2023-03-07 MED ORDER — MEDROXYPROGESTERONE ACETATE 150 MG/ML IM SUSP
150.0000 mg | Freq: Once | INTRAMUSCULAR | Status: AC
  Administered 2023-03-07: 150 mg via INTRAMUSCULAR

## 2023-03-07 NOTE — Progress Notes (Signed)
   GYNECOLOGY PROGRESS NOTE  History:  42 y.o. [id number] presents to [facility] office today for  contraceptive counseling gyn visit. She reports vaginal discharge and desires self swab today.  She denies h/a, dizziness, shortness of breath, n/v, or fever/chills.    The following portions of the patient's history were reviewed and updated as appropriate: allergies, current medications, past family history, past medical history, past social history, past surgical history and problem list. Last pap smear on 09/19/22 was normal, negative HRHPV.  Health Maintenance Due  Topic Date Due   COVID-19 Vaccine (1) Never done   DTaP/Tdap/Td (1 - Tdap) Never done     Review of Systems:  Pertinent items are noted in HPI.   Objective:  Physical Exam Blood pressure 124/78, pulse 76, height 5\' 7"  (1.702 m), weight 169 lb 6.4 oz (76.8 kg), last menstrual period 02/21/2023. VS reviewed, nursing note reviewed,  Constitutional: well developed, well nourished, no distress HEENT: normocephalic CV: normal rate Pulm/chest wall: normal effort Breast Exam: deferred Abdomen: soft Neuro: alert and oriented x 3 Skin: warm, dry Psych: affect normal Pelvic exam: Cervix pink, visually closed, without lesion, scant white creamy discharge, vaginal walls and external genitalia normal Bimanual exam: Cervix 0/long/high, firm, anterior, neg CMT, uterus nontender, nonenlarged, adnexa without tenderness, enlargement, or mass  Assessment & Plan:  1. Encounter for counseling regarding contraception --Discussed pt contraceptive plans and reviewed contraceptive methods based on pt preferences and effectiveness.  Pt prefers to restart Depo today. --UPT negative  - POCT urine pregnancy - medroxyPROGESTERone (DEPO-PROVERA) 150 MG/ML injection; Inject 1 mL (150 mg total) into the muscle every 3 (three) months.  Dispense: 1 mL; Refill: 3   2. Vaginal discharge --Self swab - Cervicovaginal ancillary only( [place])    Return in about 3 months (around 06/07/2023) for nurse visit in 3 months for Depo injection.   Sharen Counter, CNM 1:47 PM

## 2023-03-07 NOTE — Progress Notes (Signed)
Pt presents for [facility] consult. Interested in Depo. Last unprotected sex 2 weeks ago.   Pt c/o vaginal odor and discharge, requesting self swab

## 2023-03-08 LAB — CERVICOVAGINAL ANCILLARY ONLY
Bacterial Vaginitis (gardnerella): NEGATIVE
Candida Glabrata: NEGATIVE
Candida Vaginitis: NEGATIVE
Chlamydia: NEGATIVE
Comment: NEGATIVE
Comment: NEGATIVE
Comment: NEGATIVE
Comment: NEGATIVE
Comment: NEGATIVE
Comment: NORMAL
Neisseria Gonorrhea: NEGATIVE
Trichomonas: NEGATIVE

## 2023-03-26 ENCOUNTER — Other Ambulatory Visit: Payer: Self-pay | Admitting: Obstetrics & Gynecology

## 2023-03-26 DIAGNOSIS — Z139 Encounter for screening, unspecified: Secondary | ICD-10-CM

## 2023-03-27 ENCOUNTER — Ambulatory Visit: Payer: Medicaid Other

## 2023-04-21 ENCOUNTER — Ambulatory Visit (HOSPITAL_COMMUNITY)
Admission: EM | Admit: 2023-04-21 | Discharge: 2023-04-21 | Disposition: A | Discharge status: Home / Self Care | Payer: Medicaid Other | Attending: Family Medicine | Admitting: Family Medicine

## 2023-04-21 ENCOUNTER — Encounter (HOSPITAL_COMMUNITY): Payer: Self-pay

## 2023-04-21 DIAGNOSIS — N898 Other specified noninflammatory disorders of vagina: Secondary | ICD-10-CM | POA: Diagnosis not present

## 2023-04-21 DIAGNOSIS — R300 Dysuria: Secondary | ICD-10-CM | POA: Diagnosis not present

## 2023-04-21 DIAGNOSIS — Z711 Person with feared health complaint in whom no diagnosis is made: Secondary | ICD-10-CM

## 2023-04-21 LAB — POCT URINALYSIS DIP (MANUAL ENTRY)
Bilirubin, UA: NEGATIVE
Glucose, UA: NEGATIVE mg/dL
Nitrite, UA: NEGATIVE
Spec Grav, UA: >=1.03 — AB (ref 1.010–1.025)
Urobilinogen, UA: 0.2 E.U./dL
pH, UA: 5 (ref 5.0–8.0)

## 2023-04-21 LAB — POCT URINE PREGNANCY: Preg Test, Ur: NEGATIVE

## 2023-04-21 MED ORDER — DOXYCYCLINE HYCLATE 100 MG PO CAPS: 100.0000 mg | ORAL_CAPSULE | Freq: Two times a day (BID) | ORAL | 0 refills | Status: DC

## 2023-04-21 MED ORDER — CEFTRIAXONE SODIUM 500 MG IJ SOLR
500.0000 mg | Freq: Once | INTRAMUSCULAR | Status: AC
  Administered 2023-04-21: 500 mg via INTRAMUSCULAR

## 2023-04-21 MED ORDER — FLUCONAZOLE 150 MG PO TABS: ORAL_TABLET | ORAL | 0 refills | Status: DC

## 2023-04-21 MED ORDER — CEFTRIAXONE SODIUM 500 MG IJ SOLR
INTRAMUSCULAR | Status: AC
  Filled 2023-04-21: qty 500

## 2023-04-21 MED ORDER — LIDOCAINE HCL (PF) 1 % IJ SOLN
INTRAMUSCULAR | Status: AC
  Filled 2023-04-21: qty 2

## 2023-04-21 NOTE — ED Triage Notes (Signed)
Pt is here for dysuria and vaginal discharge X 2 days.  Pt reports a history of BV.

## 2023-04-21 NOTE — Discharge Instructions (Addendum)
You have been given the following today for treatment of suspected gonorrhea and/or chlamydia:  cefTRIAXone (ROCEPHIN) injection 500 mg  Please pick up your prescription for doxycycline 100 mg and begin taking twice daily for the next seven (7) days.  Even though we have treated you today, we have sent testing for sexually transmitted infections/other causes of vaginal infections as well as a urine culture. We will notify you of any positive results once they are received. If required, we will prescribe any medications you might need.  Please refrain from all sexual activity for at least the next seven days.

## 2023-04-21 NOTE — ED Provider Notes (Signed)
[facility]   [phone] 04/21/23 Arrival Time: 1522  ASSESSMENT & PLAN:  1. Vaginal discharge   2. Concern about STD in female without diagnosis   3. Dysuria    Would like empiric gonorrhea/chlamydia coverage. Given. Diflucan at pt request.  Meds ordered this encounter  Medications   fluconazole (DIFLUCAN) 150 MG tablet    Sig: Take one tablet by mouth as a single dose. May repeat in 3 days if symptoms persist.    Dispense:  2 tablet    Refill:  0   doxycycline (VIBRAMYCIN) 100 MG capsule    Sig: Take 1 capsule (100 mg total) by mouth 2 (two) times daily.    Dispense:  14 capsule    Refill:  0   cefTRIAXone (ROCEPHIN) injection 500 mg    Order Specific Question:   Antibiotic Indication:    Answer:   STD   UPT negative.    Discharge Instructions      You have been given the following today for treatment of suspected gonorrhea and/or chlamydia:  cefTRIAXone (ROCEPHIN) injection 500 mg  Please pick up your prescription for doxycycline 100 mg and begin taking twice daily for the next seven (7) days.  Even though we have treated you today, we have sent testing for sexually transmitted infections/other causes of vaginal infections as well as a urine culture. We will notify you of any positive results once they are received. If required, we will prescribe any medications you might need.  Please refrain from all sexual activity for at least the next seven days.     Without s/s of PID.  Pending: URINE CULTURE  POCT URINE PREGNANCY VAGINAL CYTOLOGY    Reviewed expectations re: course of current medical issues. Questions answered. Outlined signs and symptoms indicating need for more acute intervention. Patient verbalized understanding. After Visit Summary given.   SUBJECTIVE:  Jennifer Leach is a 42 y.o. female who presents with complaint of vaginal discharge. Onset gradual. First noticed  2 day ago . Describes discharge as thin and opaque; with  odor. No specific aggravating or alleviating factors reported. Denies: urinary frequency. With mild dysuria. Afebrile. No abdominal or pelvic pain. Normal PO intake wihout n/v. No genital rashes or lesions. Reports that she is sexually active with single female partner.   Patient's last menstrual period was 04/21/2023.   OBJECTIVE:  Vitals:   04/21/23 1557  BP: 122/76  Pulse: 73  Resp: 18  Temp: 98.5 F (36.9 C)  TempSrc: Oral  SpO2: 98%     General appearance: alert, cooperative, appears stated age and no distress Lungs: unlabored respirations; speaks full sentences without difficulty Back: no CVA tenderness; FROM at waist Abdomen: soft, non-tender GU: deferred Skin: warm and dry Psychological: alert and cooperative; normal mood and affect.  Results for orders placed or performed during the hospital encounter of 04/21/23  POC urinalysis dipstick  Result Value Ref Range   Color, UA red (A) yellow   Clarity, UA clear clear   Glucose, UA negative negative mg/dL   Bilirubin, UA negative negative   Ketones, POC UA trace (5) (A) negative mg/dL   Spec Grav, UA >=8.657 (A) 1.010 - 1.025   Blood, UA large (A) negative   pH, UA 5.0 5.0 - 8.0   Protein Ur, POC trace (A) negative mg/dL   Urobilinogen, UA 0.2 0.2 or 1.0 E.U./dL   Nitrite, UA Negative Negative   Leukocytes, UA Trace (A) Negative  POCT urine pregnancy  Result Value Ref  Range   Preg Test, Ur Negative Negative    Labs Reviewed  POCT URINALYSIS DIP (MANUAL ENTRY) - Abnormal; Notable for the following components:      Result Value   Color, UA red (*)    Ketones, POC UA trace (5) (*)    Spec Grav, UA >=1.030 (*)    Blood, UA large (*)    Protein Ur, POC trace (*)    Leukocytes, UA Trace (*)    All other components within normal limits  URINE CULTURE  POCT URINE PREGNANCY    Allergies  Allergen Reactions   Septra Ds [Sulfamethoxazole-Trimethoprim] Anaphylaxis, Swelling, Rash and Other (See Comments)     Reaction:  Facial swelling     Past Medical History:  Diagnosis Date   Bacterial vaginitis 03/04/2017   Gonorrhea    Labial swelling 03/04/2017   MRSA (methicillin resistant staph aureus) culture positive 08/06/2014   Family History  Problem Relation Age of Onset   Hypertension Mother    Social History   Socioeconomic History   Marital status: Single    Spouse name: Not on file   Number of children: Not on file   Years of education: Not on file   Highest education level: Not on file  Occupational History   Not on file  Tobacco Use   Smoking status: Every Day    Current packs/day: 0.00    Average packs/day: 0.3 packs/day for 9.0 years (2.3 ttl pk-yrs)    Types: Cigarettes    Start date: 06/10/2003    Last attempt to quit: 06/09/2012    Years since quitting: 10.8   Smokeless tobacco: Never   Tobacco comments:    pt reports is is smoking   Vaping Use   Vaping status: Never Used  Substance and Sexual Activity   Alcohol use: No   Drug use: No   Sexual activity: Yes    Partners: Male    Birth control/protection: Surgical  Other Topics Concern   Not on file  Social History Narrative   Not on file   Social Determinants of Health   Financial Resource Strain: Not on File (02/01/2022)   Received from [employer], [employer]    Financial Resource Strain: 0  Food Insecurity: Not on File (02/01/2022)   Received from [place], [place]   Food Insecurity    Food: 0  Transportation Needs: Not on File (02/01/2022)   Received from [place], [employer] Needs    Transportation: 0  Physical Activity: Not on File (02/01/2022)   Received from [place], [place]   Physical Activity    Physical Activity: 0  Stress: Not on File (02/01/2022)   Received from [facility], [place]   Stress    Stress: 0  Social Connections: Not on File (02/01/2022)   Received from [place], [place]   Social Connections    Social Connections and Isolation: 0  Intimate Partner Violence: Not on file            [place], MD 04/21/23 [phone]

## 2023-04-22 LAB — URINE CULTURE: Culture: NO GROWTH

## 2023-05-03 ENCOUNTER — Other Ambulatory Visit (HOSPITAL_COMMUNITY)
Admission: RE | Admit: 2023-05-03 | Discharge: 2023-05-03 | Disposition: A | Discharge status: Home / Self Care | Payer: Medicaid Other | Source: Ambulatory Visit

## 2023-05-03 ENCOUNTER — Encounter: Payer: Self-pay | Admitting: Obstetrics and Gynecology

## 2023-05-03 ENCOUNTER — Other Ambulatory Visit (HOSPITAL_COMMUNITY): Admission: RE | Admit: 2023-05-03 | Payer: Medicaid Other | Source: Ambulatory Visit

## 2023-05-03 ENCOUNTER — Ambulatory Visit: Payer: Medicaid Other | Admitting: Obstetrics and Gynecology

## 2023-05-03 VITALS — BP 121/85 | HR 67 | Ht 67.0 in | Wt 169.0 lb

## 2023-05-03 DIAGNOSIS — B9689 Other specified bacterial agents as the cause of diseases classified elsewhere: Secondary | ICD-10-CM | POA: Diagnosis not present

## 2023-05-03 DIAGNOSIS — N939 Abnormal uterine and vaginal bleeding, unspecified: Secondary | ICD-10-CM | POA: Insufficient documentation

## 2023-05-03 DIAGNOSIS — Z9289 Personal history of other medical treatment: Secondary | ICD-10-CM | POA: Diagnosis not present

## 2023-05-03 DIAGNOSIS — N760 Acute vaginitis: Secondary | ICD-10-CM

## 2023-05-03 NOTE — Progress Notes (Signed)
GYNECOLOGY PROGRESS NOTE  History:  42 y.o. [id number] presents to [facility] office today for problem gyn visit. Patient is interested in hysterectomy. She reports abnormal bleeding on depo provera  Last injection was 5/29. Heavy bleeding and clots, bleeds through pads, tampons, bleeding through pants. Cannot have intercourse because of the bleeding. Bleeding every day. Occasional times where she is not bleeding. Was on depo in the past, and worked well had regular monthly cycles. Started depo injections in May, second dose  next month. Prior to injection in May, reports heavy irregularly bleeding as described above.    Reports heavy bleeding and bleeding after intercourse started after her tubal 6 years ago. Had a pelvic u/s done 2022 and 2023. She denies h/a, dizziness, shortness of breath, n/v, or fever/chills.    The following portions of the patient's history were reviewed and updated as appropriate: allergies, current medications, past family history, past medical history, past social history, past surgical history and problem list. Last pap smear on 09/19/22 was normal, negative HRHPV.  Health Maintenance Due  Topic Date Due   DTaP/Tdap/Td (1 - Tdap) Never done   COVID-19 Vaccine (1 - 2023-24 season) Never done     Review of Systems:  Pertinent items are noted in HPI.   Objective:  Physical Exam Blood pressure 121/85, pulse 67, height 5\' 7"  (1.702 m), weight 169 lb (76.7 kg), last menstrual period 04/21/2023. VS reviewed, nursing note reviewed,  Constitutional: well developed, well nourished, no distress HEENT: normocephalic CV: normal rate Pulm/chest wall: normal effort Breast Exam: deferred Abdomen: soft Neuro: alert and oriented x 3 Skin: warm, dry Psych: affect normal Pelvic exam: Cervix pink, visually closed, without lesion, scant white creamy discharge, vaginal walls and external genitalia normal Bimanual exam: Cervix 0/long/high, firm, anterior, neg CMT, uterus  nontender, nonenlarged, adnexa without tenderness, enlargement, or mass    GYNECOLOGY OFFICE PROCEDURE NOTE ENDOMETRIAL BIOPSY     The indications for endometrial biopsy were reviewed.   Risks of the biopsy including cramping, bleeding, infection, uterine perforation, inadequate specimen and need for additional procedures were discussed. The patient agrees to undergo procedure today. Urine pregnancy test was Not indicated. Consent was signed. Time out was performed. Dr. Para March at bedside to assist with procedure.   Patient was positioned in dorsal lithotomy position. A vaginal speculum was placed.  The cervix was visualized and was prepped with Betadine. The 3 mm pipelle was easily introduced into the endometrial cavity without difficulty to a depth of 10 cm, and a Moderate amount of tissue was obtained after one pass and sent to pathology. The instruments were removed from the patient's vagina. Minimal bleeding from the cervix was noted. The patient tolerated the procedure well.   Patient was given post procedure instructions.  Will follow up pathology and manage accordingly; patient will be contacted with results and recommendations.  Routine preventative health maintenance measures emphasized.   Assessment & Plan:   1. Abnormal uterine bleeding Discussed abnormal bleeding patterns with depo provera within first couple of months start. However reports abnormal bleeding prior to initiation and is ongoing for years. Reviewed previous u/s with Dr. Para March that was normal.  Discussed indication for biopsy, rule out infection, and will plan follow up with MD for hysterectomy as expressed by patient.  - Cervicovaginal ancillary only( [place]) - Surgical pathology( [place]/ [facility])  2. History of endometrial biopsy Discussed plan with Dr Para March, endometrial biopsy discussed and performed today with assist from Dr. Para March  Return  for MD visit for hysterectomy consult  Jennifer Grates, FNP

## 2023-05-03 NOTE — Progress Notes (Signed)
Pt presents for AUB with Depo Last injection 5/29 Pt reports AUB bleeding since tubal 6 years ago, heavy bleeding and bleeding after intercourse. Pt interested in hysterectomy. Pelvic US done 2022 and 2023

## 2023-05-06 MED ORDER — METRONIDAZOLE 500 MG PO TABS
500.0000 mg | ORAL_TABLET | Freq: Two times a day (BID) | ORAL | 0 refills | Status: AC
Start: 2023-05-06 — End: 2023-05-13

## 2023-05-06 NOTE — Addendum Note (Signed)
Addended by: Sue Lush on: 05/06/2023 05:45 PM   Modules accepted: Orders

## 2023-05-31 ENCOUNTER — Ambulatory Visit: Payer: Medicaid Other

## 2023-06-07 ENCOUNTER — Ambulatory Visit: Payer: Medicaid Other | Admitting: Obstetrics and Gynecology

## 2023-06-13 ENCOUNTER — Ambulatory Visit: Payer: Medicaid Other | Admitting: Obstetrics and Gynecology

## 2023-06-13 VITALS — BP 121/86 | HR 79 | Wt 173.0 lb

## 2023-06-13 DIAGNOSIS — N939 Abnormal uterine and vaginal bleeding, unspecified: Secondary | ICD-10-CM

## 2023-06-13 MED ORDER — NORETHINDRONE ACETATE 5 MG PO TABS: 15.0000 mg | ORAL_TABLET | Freq: Every day | ORAL | 11 refills | Status: DC

## 2023-06-13 NOTE — Progress Notes (Signed)
   RETURN GYNECOLOGY VISIT  Subjective:  Jennifer Leach is a 42 y.o. [phone] presenting for follow up of AUB  Reports longstanding history irregular, heavy cycles since the birth of her daughter 6 years ago. Passes clots, changes pads q 30 min to 2 hours. Has pain with heavy bleeding. Has to sleep with a heating pad. Notes bleeding is worse after intercourse. Has tried ibuprofen, lysteda and Depo without relief.  She has completed childbearing.  I personally reviewed the following: - Benign EMB 05/03/23 - Hgb 11.8 1/25 - Pap NILM/HPV neg 09/19/22 - TSH 0.55 09/19/22 - Pelvic US 09/25/22 8 x 6 x 5cm uterus, normal ovaries  PMH: denies. BMI 27 PSH: postpartum BTL Meds: denies All: sulfas OB: SVDx3, ABx2 Gyn: no abnormal paps, hx GC/CT Soc: 1/2ppd, no etoh/drug use  Objective:   Vitals:   06/13/23 1323  BP: 121/86  Pulse: 79  Weight: 173 lb (78.5 kg)    General:  Alert, oriented and cooperative. Patient is in no acute distress.  Skin: Skin is warm and dry. No rash noted.   Cardiovascular: Normal heart rate noted  Respiratory: Normal respiratory effort, no problems with respiration noted  Abdomen: Soft, non-tender, non-distended    Assessment and Plan:  Jennifer Leach is a 42 y.o. with abnormal uterine bleeding  Abnormal uterine bleeding - Unremarkable work up, benign EMB - Reviewed management options including progestins (continuous & cyclic), LNG-IUD (Mirena), endometrial ablation, and hysterectomy. Not a candidate for estrogen containing methods due to tobacco use. After discussion, pt would like to try progestins and is considering IUD. Is worried about taking time off for hysterectomy. Would likely be a vaginal hysterectomy candidate, but will need to repeat exam if pt elects for surgical management - Discussed that we can try to decrease aygestin dose over time, but will start with 15mg  daily to get control of her current bleeding -     norethindrone (AYGESTIN) 5  MG tablet; Take 3 tablets (15 mg total) by mouth daily.  Return in about 3 months (around 09/12/2023) for follow up AUB with Dr. Berton Lan .  Lennart Pall, MD

## 2023-06-13 NOTE — Progress Notes (Signed)
Pt is in office to discuss endo bx results and surgical management of bleeding. Has Endo Bx last visit. Pt states she is currently still bleeding on and off with clots. Previously on Depo, did not want to continue.

## 2023-07-03 ENCOUNTER — Ambulatory Visit: Payer: Medicaid Other

## 2023-07-05 ENCOUNTER — Ambulatory Visit (HOSPITAL_COMMUNITY): Payer: Self-pay

## 2023-07-05 IMAGING — CR DG CHEST 2V
2 series · 2 of 2 positions shown · non-contrast
Comparison: None.

CLINICAL DATA: Palpitations

EXAM:
CHEST - 2 VIEW

[chest pa]
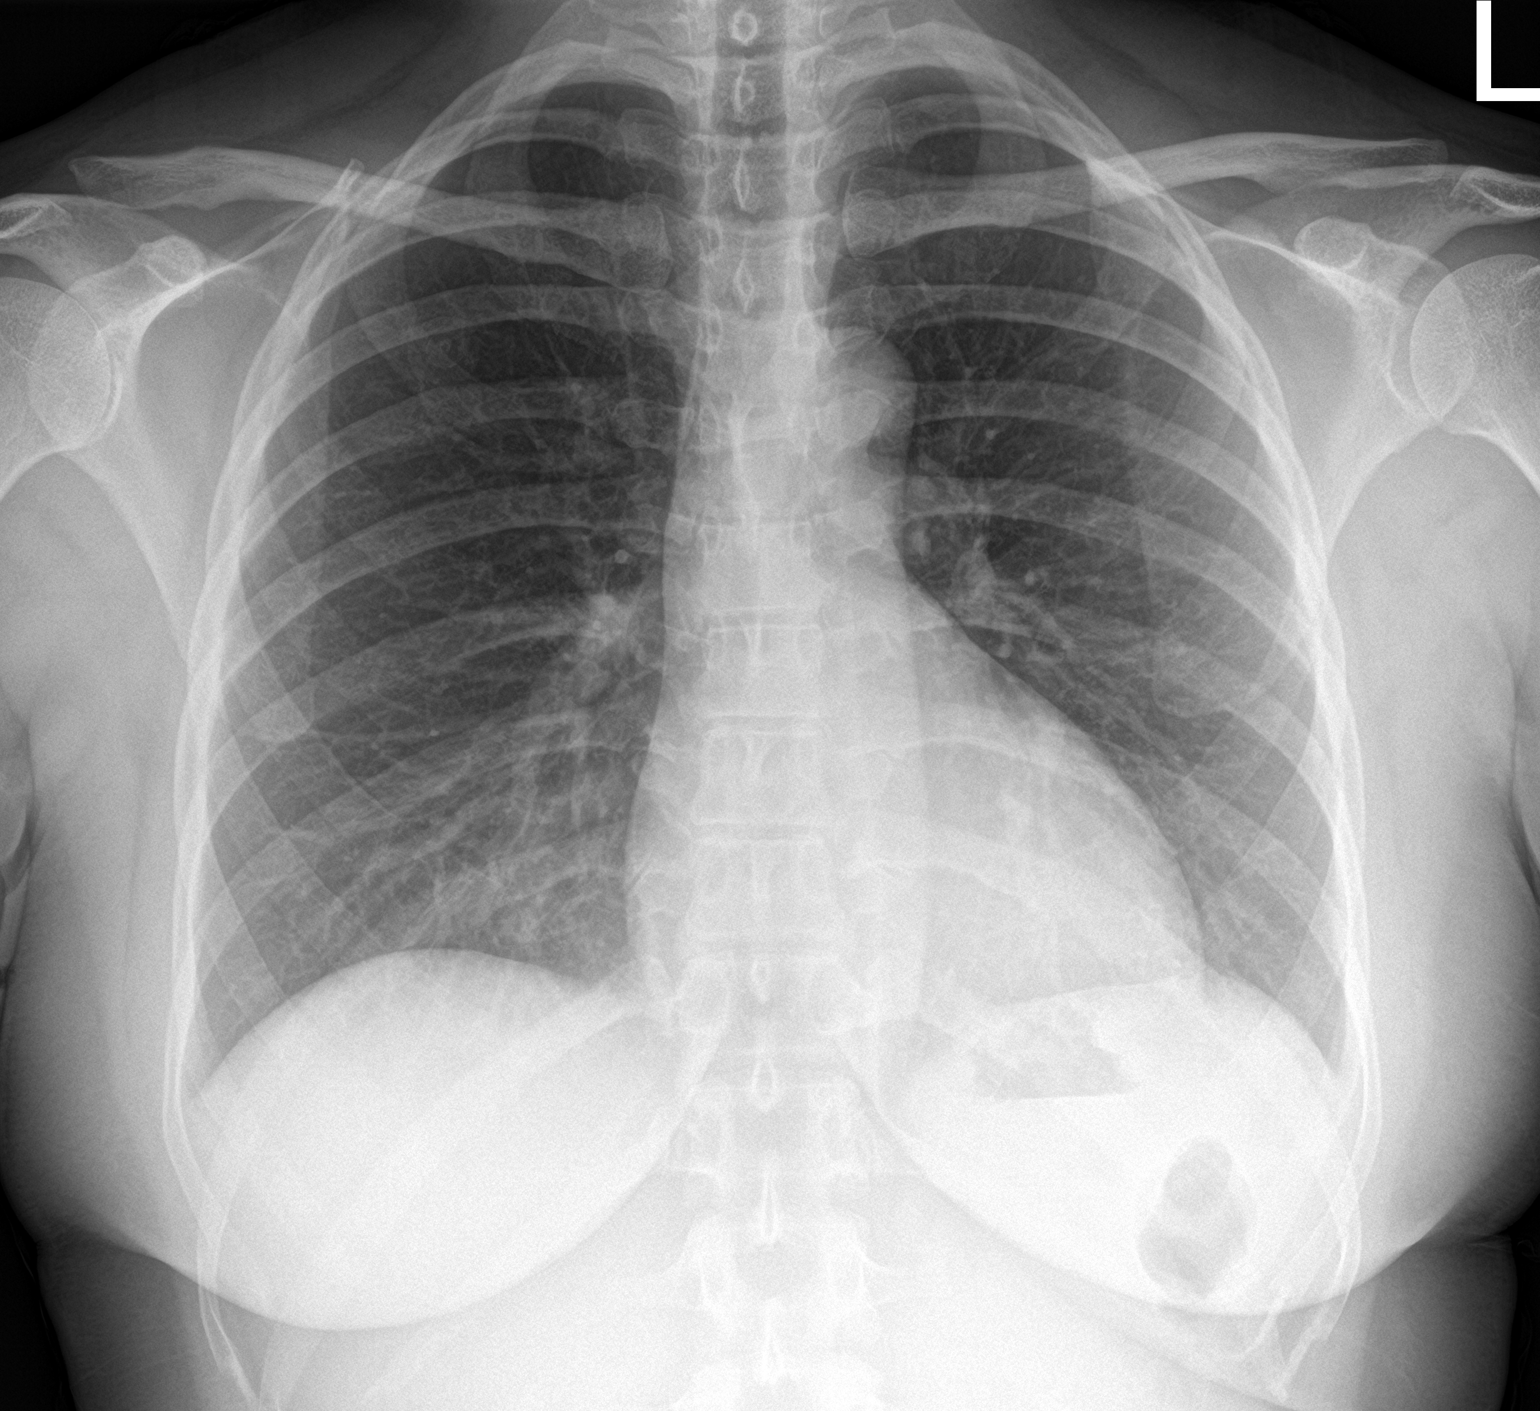

[chest lat]
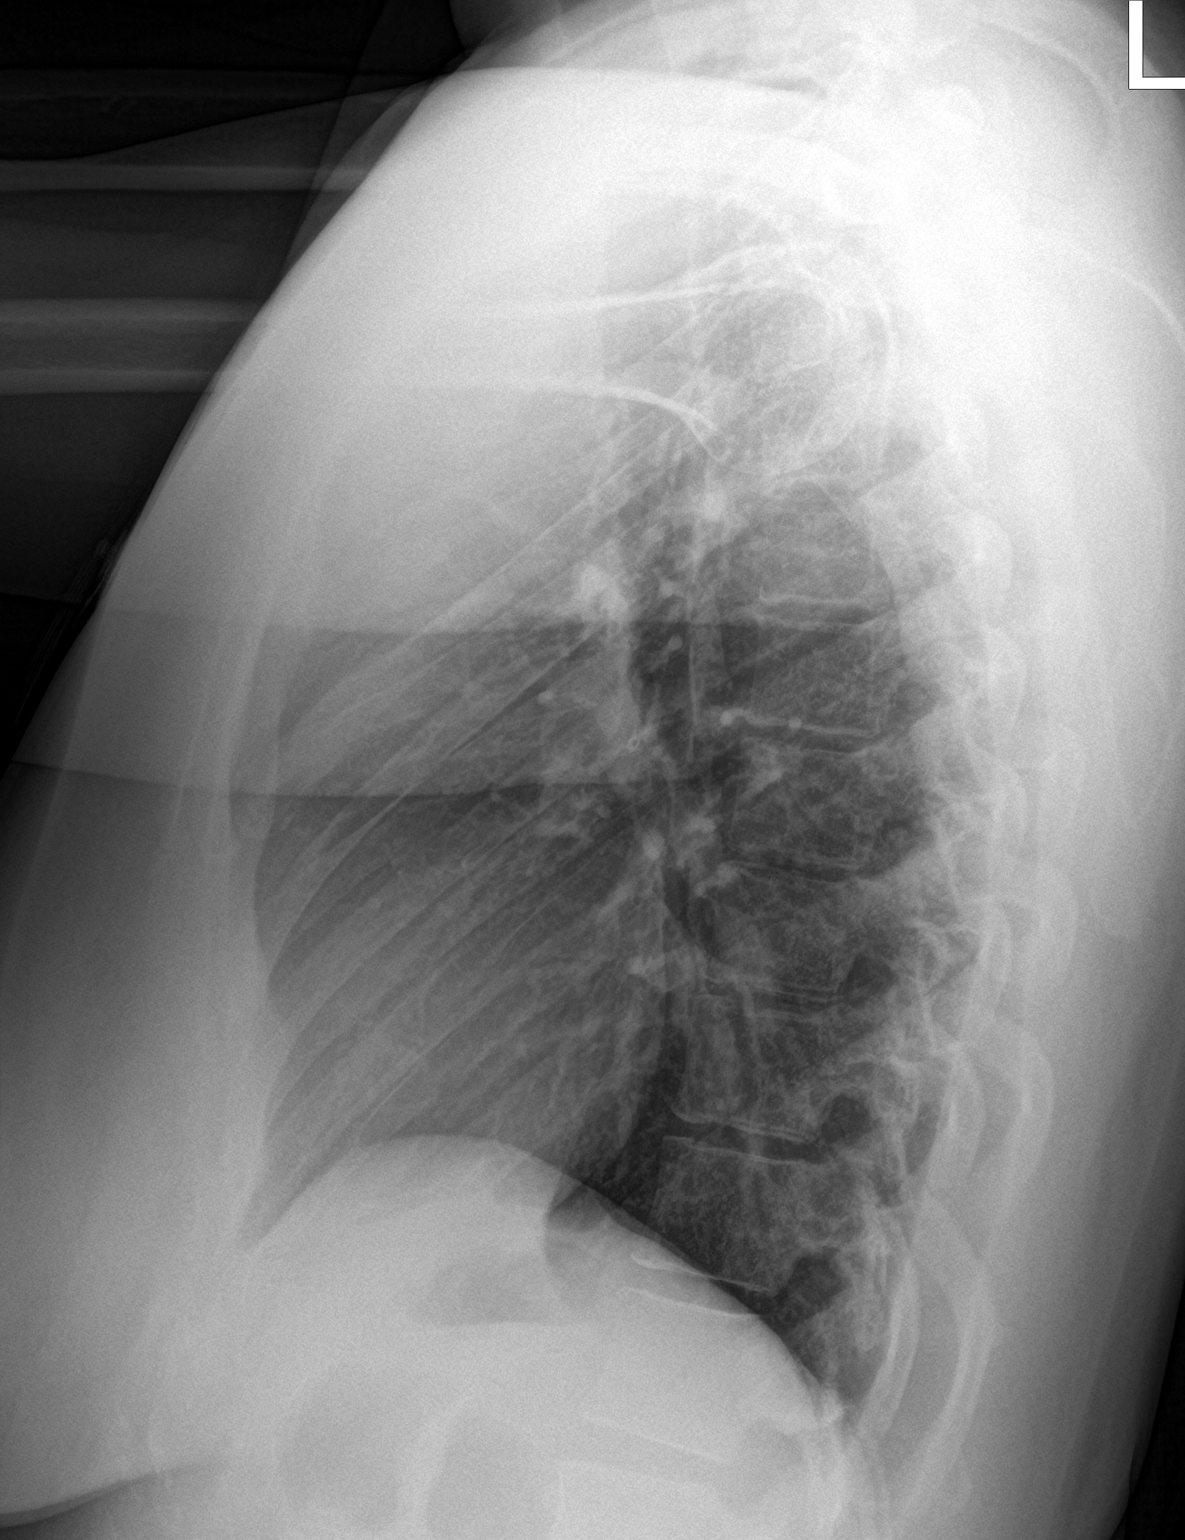

[2 of 2 positions shown; findings below may reference images not displayed]

FINDINGS: The heart size and mediastinal contours are within normal limits.
Both lungs are clear. The visualized skeletal structures are
unremarkable.
IMPRESSION: No active cardiopulmonary disease.

## 2023-07-06 ENCOUNTER — Ambulatory Visit (INDEPENDENT_AMBULATORY_CARE_PROVIDER_SITE_OTHER): Payer: Medicaid Other | Admitting: Obstetrics and Gynecology

## 2023-07-06 ENCOUNTER — Other Ambulatory Visit (HOSPITAL_COMMUNITY)
Admission: RE | Admit: 2023-07-06 | Discharge: 2023-07-06 | Disposition: A | Discharge status: Home / Self Care | Payer: Medicaid Other | Source: Ambulatory Visit | Attending: Obstetrics and Gynecology | Admitting: Obstetrics and Gynecology

## 2023-07-06 VITALS — BP 130/87 | HR 67 | Ht 67.0 in | Wt 174.0 lb

## 2023-07-06 DIAGNOSIS — L739 Follicular disorder, unspecified: Secondary | ICD-10-CM

## 2023-07-06 DIAGNOSIS — N898 Other specified noninflammatory disorders of vagina: Secondary | ICD-10-CM | POA: Diagnosis present

## 2023-07-06 DIAGNOSIS — L732 Hidradenitis suppurativa: Secondary | ICD-10-CM

## 2023-07-06 MED ORDER — DOXYCYCLINE HYCLATE 100 MG PO CAPS: 100.0000 mg | ORAL_CAPSULE | Freq: Every day | ORAL | 3 refills | Status: DC

## 2023-07-06 NOTE — Progress Notes (Signed)
Pt c/o recurring bumps in the vaginal area and armpits

## 2023-07-06 NOTE — Patient Instructions (Addendum)
Avoid harsh and scented soaps/cleansers on the vulva as it can make your symptoms worse.   Next time you get a bump on the vulva, try applying a warm washcloth for 20 minutes 3 times a day. You can also soak in a warm bath without soap. This will help it drain.   Doxycycline (the antibiotic I prescribed) can make your skin sensitive to the sun, so please make sure you wear sunscreen.

## 2023-07-06 NOTE — Addendum Note (Signed)
Addended by: Jearld Adjutant on: 07/06/2023 12:00 PM   Modules accepted: Orders

## 2023-07-06 NOTE — Progress Notes (Signed)
   RETURN GYNECOLOGY VISIT  Subjective:  Jennifer Leach is a 42 y.o. [phone] s/p BTL presenting for vaginal bump  Recurring bump on her clitoris and vulva. Appears same color as her skin. No pus/oozing. Occurs around the time of her period. Tried switching from pads to tampons without improvement. Uses dial antimicrobial soap. Has also applied hydrogen peroxide, alcohol and gasoline.   Also gets boils in her armpit that will come to a head and burst.    Has been diagnosed with HS in the past  Is also f/w me for AUB - started aygestin with improvement in her symptoms.  Objective:   Vitals:   07/06/23 1039  BP: 130/87  Pulse: 67  Weight: 174 lb (78.9 kg)  Height: 5\' 7"  (1.702 m)   General:  Alert, oriented and cooperative. Patient is in no acute distress.  Skin: Skin is warm and dry. 5mm denuded skin on R labia majora. Small HS-appearing lesions in bilateral axilla. Unable to fully retract clitoral hood.  Cardiovascular: Normal heart rate noted  Respiratory: Normal respiratory effort, no problems with respiration noted  Pelvic: See above  Exam performed in the presence of a chaperone  Assessment and Plan:  Jennifer Leach is a 42 y.o. with HS vs folliculitis   Hidradenitis suppurativa Folliculitis Discussed general vulvar care (see AVS) -     Ambulatory referral to Dermatology -     doxycycline (VIBRAMYCIN) 100 MG capsule; Take 1 capsule (100 mg total) by mouth daily.  AUB Continue aygestin, pt will follow up in December or sooner prn  Lennart Pall, MD

## 2023-07-09 LAB — CERVICOVAGINAL ANCILLARY ONLY
Bacterial Vaginitis (gardnerella): NEGATIVE
Chlamydia: NEGATIVE
Comment: NEGATIVE
Comment: NEGATIVE
Comment: NEGATIVE
Comment: NORMAL
Neisseria Gonorrhea: NEGATIVE
Trichomonas: NEGATIVE

## 2023-07-27 ENCOUNTER — Ambulatory Visit (INDEPENDENT_AMBULATORY_CARE_PROVIDER_SITE_OTHER): Payer: Medicaid Other | Admitting: Adult Health

## 2023-07-27 ENCOUNTER — Encounter: Payer: Self-pay | Admitting: Adult Health

## 2023-07-27 VITALS — BP 120/84 | HR 72 | Temp 98.4°F | Ht 67.0 in | Wt 180.0 lb

## 2023-07-27 DIAGNOSIS — J029 Acute pharyngitis, unspecified: Secondary | ICD-10-CM

## 2023-07-27 DIAGNOSIS — J020 Streptococcal pharyngitis: Secondary | ICD-10-CM | POA: Diagnosis not present

## 2023-07-27 DIAGNOSIS — R350 Frequency of micturition: Secondary | ICD-10-CM

## 2023-07-27 LAB — POCT URINALYSIS DIPSTICK
Bilirubin, UA: NEGATIVE
Blood, UA: POSITIVE
Glucose, UA: NEGATIVE
Ketones, UA: NEGATIVE
Nitrite, UA: NEGATIVE
Protein, UA: POSITIVE — AB
Spec Grav, UA: 1.02 (ref 1.010–1.025)
Urobilinogen, UA: 1 U/dL — AB
pH, UA: 6 (ref 5.0–8.0)

## 2023-07-27 LAB — POCT INFLUENZA A/B
Influenza A, POC: NEGATIVE
Influenza B, POC: NEGATIVE

## 2023-07-27 LAB — POC COVID19 BINAXNOW: SARS Coronavirus 2 Ag: NEGATIVE

## 2023-07-27 LAB — POCT RAPID STREP A (OFFICE): Rapid Strep A Screen: POSITIVE — AB

## 2023-07-27 MED ORDER — METHYLPREDNISOLONE 4 MG PO TBPK
ORAL_TABLET | ORAL | 0 refills | Status: DC
Start: 2023-07-27 — End: 2023-08-27

## 2023-07-27 MED ORDER — AMOXICILLIN-POT CLAVULANATE 875-125 MG PO TABS
1.0000 | ORAL_TABLET | Freq: Two times a day (BID) | ORAL | 0 refills | Status: DC
Start: 2023-07-27 — End: 2023-08-27

## 2023-07-27 NOTE — Progress Notes (Signed)
Subjective:    Patient ID: Jennifer Leach, female    DOB: Oct 08, 1981, 42 y.o.   MRN: [id number]  Back Pain    42 year old female who  has a past medical history of Bacterial vaginitis (03/04/2017), Gonorrhea, Labial swelling (03/04/2017), and MRSA (methicillin resistant staph aureus) culture positive (08/06/2014).  She is a patient of Dr. [place] who I am seeing today for an acute visit.   Sore throat - started about three days ago. Pain is mild and described as " burning". She also has bilateral ear pain and runny nose. She denies fevers or chills. Has not been exposed to strep throat   Low back pain - has been present for about a week. Pain is worse with sitting and standing. She denies dysuria or hematuria. She has frequency, urgency and incomplete bladder emptying.     Review of Systems  Musculoskeletal:  Positive for back pain.   See HPI   Past Medical History:  Diagnosis Date   Bacterial vaginitis 03/04/2017   Gonorrhea    Labial swelling 03/04/2017   MRSA (methicillin resistant staph aureus) culture positive 08/06/2014    Social History   Socioeconomic History   Marital status: Single    Spouse name: Not on file   Number of children: Not on file   Years of education: Not on file   Highest education level: Not on file  Occupational History   Not on file  Tobacco Use   Smoking status: Every Day    Current packs/day: 0.00    Average packs/day: 0.3 packs/day for 9.0 years (2.3 ttl pk-yrs)    Types: Cigarettes    Start date: 06/10/2003    Last attempt to quit: 06/09/2012    Years since quitting: 11.1   Smokeless tobacco: Never   Tobacco comments:    pt reports is is smoking   Vaping Use   Vaping status: Never Used  Substance and Sexual Activity   Alcohol use: No   Drug use: No   Sexual activity: Yes    Partners: Male    Birth control/protection: Surgical  Other Topics Concern   Not on file  Social History Narrative   Not on file   Social Determinants of  Health   Financial Resource Strain: Not on File (02/01/2022)   Received from [facility], [facility]    Financial Resource Strain: 0  Food Insecurity: Not on File (02/01/2022)   Received from [place], [place]   Food Insecurity    Food: 0  Transportation Needs: Not on File (02/01/2022)   Received from [facility], [facility] Needs    Transportation: 0  Physical Activity: Not on File (02/01/2022)   Received from [place], [place]   Physical Activity    Physical Activity: 0  Stress: Not on File (02/01/2022)   Received from [facility], [place]   Stress    Stress: 0  Social Connections: Not on File (02/01/2022)   Received from [place], [place]   Social Connections    Social Connections and Isolation: 0  Intimate Partner Violence: Not on file    Past Surgical History:  Procedure Laterality Date   TUBAL LIGATION N/A 02/19/2017   Procedure: POST PARTUM TUBAL LIGATION;  Surgeon: [name], MD;  [facility]: [facility];  Service: Gynecology;  Laterality: N/A;   WISDOM TOOTH EXTRACTION      Family History  Problem Relation Age of Onset   Hypertension Mother     Allergies  Allergen Reactions   Septra Ds [Sulfamethoxazole-Trimethoprim] Anaphylaxis, Swelling, Rash and Other (See Comments)    Reaction:  Facial swelling     No current outpatient medications on file prior to visit.   No current facility-administered medications on file prior to visit.    BP 120/84   Pulse 72   Temp 98.4 F (36.9 C) (Oral)   Ht 5\' 7"  (1.702 m)   Wt 180 lb (81.6 kg)   SpO2 98%   BMI 28.19 kg/m       Objective:   Physical Exam Vitals and nursing note reviewed.  Constitutional:      Appearance: Normal appearance.  HENT:     Right Ear: Hearing, tympanic membrane, ear canal and external ear normal.     Left Ear: Hearing, tympanic membrane, ear canal and external ear normal.     Mouth/Throat:     Tonsils: Tonsillar exudate present. 2+ on the right. 2+ on the left.   Cardiovascular:     Rate and Rhythm: Normal rate and regular rhythm.     Pulses: Normal pulses.     Heart sounds: Normal heart sounds.  Pulmonary:     Effort: Pulmonary effort is normal.     Breath sounds: Normal breath sounds.  Abdominal:     General: Abdomen is flat.     Palpations: Abdomen is soft.     Tenderness: There is abdominal tenderness in the periumbilical area and suprapubic area. There is right CVA tenderness and left CVA tenderness.  Musculoskeletal:        General: Normal range of motion.     Lumbar back: Spasms and tenderness present. No bony tenderness. Normal range of motion.  Skin:    General: Skin is warm and dry.  Neurological:     General: No focal deficit present.     Mental Status: She is alert and oriented to person, place, and time.  Psychiatric:        Mood and Affect: Mood normal.        Behavior: Behavior normal.        Thought Content: Thought content normal.        Judgment: Judgment normal.       Assessment & Plan:  1. Sore throat  - POC Rapid Strep A- Positive - POC COVID-19 BinaxNow- Negative - POCT Influenza A/B- Negative  - amoxicillin-clavulanate (AUGMENTIN) 875-125 MG tablet; Take 1 tablet by mouth 2 (two) times daily.  Dispense: 20 tablet; Refill: 0 - methylPREDNISolone (MEDROL DOSEPAK) 4 MG TBPK tablet; Take as directed  Dispense: 21 tablet; Refill: 0  2. Strep throat - Will treat strep throat with Augmentin and cover for UTI  - amoxicillin-clavulanate (AUGMENTIN) 875-125 MG tablet; Take 1 tablet by mouth 2 (two) times daily.  Dispense: 20 tablet; Refill: 0 - methylPREDNISolone (MEDROL DOSEPAK) 4 MG TBPK tablet; Take as directed  Dispense: 21 tablet; Refill: 0  3. Frequent urination - Possible UTI vs muscular strain. Will cover with Augmentin and prednisone  - POC Urinalysis Dipstick + leuks, blood, and protein - Urine Culture; Future - Urine Culture - amoxicillin-clavulanate (AUGMENTIN) 875-125 MG tablet; Take 1 tablet by mouth  2 (two) times daily.  Dispense: 20 tablet; Refill: 0  Shirline Frees, NP  Time spent with patient today was 31 minutes which consisted of chart review, discussing Strep throat, low back pain and UTIS work up, treatment answering questions and documentation.

## 2023-07-28 LAB — URINE CULTURE
MICRO NUMBER:: 15613856
Result:: NO GROWTH
SPECIMEN QUALITY:: ADEQUATE

## 2023-07-31 ENCOUNTER — Other Ambulatory Visit (HOSPITAL_COMMUNITY)
Admission: RE | Admit: 2023-07-31 | Discharge: 2023-07-31 | Disposition: A | Discharge status: Home / Self Care | Payer: Medicaid Other | Source: Ambulatory Visit | Attending: Obstetrics and Gynecology | Admitting: Obstetrics and Gynecology

## 2023-07-31 ENCOUNTER — Ambulatory Visit: Payer: Medicaid Other | Admitting: Family Medicine

## 2023-07-31 ENCOUNTER — Ambulatory Visit: Payer: Medicaid Other | Admitting: Emergency Medicine

## 2023-07-31 VITALS — BP 145/89 | HR 80 | Ht 67.0 in | Wt 174.0 lb

## 2023-07-31 DIAGNOSIS — N898 Other specified noninflammatory disorders of vagina: Secondary | ICD-10-CM | POA: Diagnosis present

## 2023-07-31 NOTE — Progress Notes (Signed)
SUBJECTIVE:  42 y.o. female complains of white vaginal discharge for 3 day(s). Denies abnormal vaginal bleeding or significant pelvic pain or fever. No UTI symptoms. Denies history of known exposure to STD.  No LMP recorded.  OBJECTIVE:  She appears well, afebrile. Urine dipstick: not done.  ASSESSMENT:  Vaginal Discharge  Vaginal Odor   PLAN:  GC, chlamydia, trichomonas, BVAG, CVAG probe sent to lab. Treatment: To be determined once lab results are received ROV prn if symptoms persist or worsen.

## 2023-08-01 ENCOUNTER — Ambulatory Visit: Payer: Medicaid Other | Admitting: Family Medicine

## 2023-08-02 ENCOUNTER — Ambulatory Visit (HOSPITAL_COMMUNITY): Payer: Self-pay

## 2023-08-02 LAB — CERVICOVAGINAL ANCILLARY ONLY
Bacterial Vaginitis (gardnerella): NEGATIVE
Candida Glabrata: NEGATIVE
Candida Vaginitis: POSITIVE — AB
Chlamydia: NEGATIVE
Comment: NEGATIVE
Comment: NEGATIVE
Comment: NEGATIVE
Comment: NEGATIVE
Comment: NEGATIVE
Comment: NORMAL
Neisseria Gonorrhea: NEGATIVE
Trichomonas: NEGATIVE

## 2023-08-03 ENCOUNTER — Other Ambulatory Visit: Payer: Self-pay

## 2023-08-03 DIAGNOSIS — B379 Candidiasis, unspecified: Secondary | ICD-10-CM

## 2023-08-03 MED ORDER — FLUCONAZOLE 150 MG PO TABS: 150.0000 mg | ORAL_TABLET | Freq: Once | ORAL | 0 refills | Status: AC

## 2023-08-06 ENCOUNTER — Ambulatory Visit: Payer: Medicaid Other | Admitting: Family Medicine

## 2023-08-14 ENCOUNTER — Encounter (HOSPITAL_COMMUNITY): Payer: Self-pay

## 2023-08-14 ENCOUNTER — Ambulatory Visit (HOSPITAL_COMMUNITY)
Admission: EM | Admit: 2023-08-14 | Discharge: 2023-08-14 | Disposition: A | Discharge status: Home / Self Care | Payer: Medicaid Other | Attending: Internal Medicine | Admitting: Internal Medicine

## 2023-08-14 DIAGNOSIS — J069 Acute upper respiratory infection, unspecified: Secondary | ICD-10-CM

## 2023-08-14 LAB — POCT RAPID STREP A (OFFICE): Rapid Strep A Screen: NEGATIVE

## 2023-08-14 MED ORDER — PROMETHAZINE-DM 6.25-15 MG/5ML PO SYRP: 5.0000 mL | ORAL_SOLUTION | Freq: Four times a day (QID) | ORAL | 0 refills | Status: DC | PRN

## 2023-08-14 NOTE — Discharge Instructions (Addendum)
Upper respiratory infection likely secondary to viral infection. Can treat conservatively with alternating tylenol and ibuprofen. Can use Promethazine-DM 5mL every 6 hours as needed for cough. Use caution as this can make you sleepy.   Ok to gargle with salt water and to use over the counter lozenges   Return to urgent care or PCP if symptoms worsen or fail to resolve.

## 2023-08-14 NOTE — ED Provider Notes (Signed)
[facility]    CSN: [id number] Arrival date & time: 08/14/23  1825      History   Chief Complaint Chief Complaint  Patient presents with   Sore Throat    HPI Jennifer Leach is a 42 y.o. female.   42 yr old female who is seen in urgent care with 2 days of sore throat and headache with body aches and fatigue. Was treated for strep throat starting on 07/27/23 but only took half of the pills as she was feeling better so stopped the medicine and reports her symptoms from this resolved. Reports that her throat is constantly burning but she is able to eat and drink ok. Having "heat flashes" but no chills.    Sore Throat Associated symptoms include headaches. Pertinent negatives include no chest pain, no abdominal pain and no shortness of breath.    Past Medical History:  Diagnosis Date   Bacterial vaginitis 03/04/2017   Gonorrhea    Labial swelling 03/04/2017   MRSA (methicillin resistant staph aureus) culture positive 08/06/2014    Patient Active Problem List   Diagnosis Date Noted   Chronic bilateral low back pain 02/05/2023   GAD (generalized anxiety disorder) 09/25/2022   Insomnia 09/25/2022   Depression, major, recurrent, moderate (HCC) 09/25/2022   Breakthrough bleeding on Depo-Provera 04/14/2020   Hidradenitis 04/14/2020   Gonorrhea 11/20/2019   Abnormal uterine bleeding (AUB) 11/19/2018   H/O tubal ligation 03/15/2017   Bacterial vaginitis 03/04/2017   Labial swelling 03/04/2017    Past Surgical History:  Procedure Laterality Date   TUBAL LIGATION N/A 02/19/2017   Procedure: POST PARTUM TUBAL LIGATION;  Surgeon: [name], MD;  Location: [facility];  Service: Gynecology;  Laterality: N/A;   WISDOM TOOTH EXTRACTION      OB History     Gravida  4   Para  4   Term  4   Preterm      AB  0   Living  4      SAB      IAB  0   Ectopic      Multiple  0   Live Births  4            Home Medications    Prior to  Admission medications   Medication Sig Start Date End Date Taking? Authorizing Provider  amoxicillin-clavulanate (AUGMENTIN) 875-125 MG tablet Take 1 tablet by mouth 2 (two) times daily. 07/27/23   Nafziger, Kandee Keen, NP  methylPREDNISolone (MEDROL DOSEPAK) 4 MG TBPK tablet Take as directed 07/27/23   Shirline Frees, NP    Family History Family History  Problem Relation Age of Onset   Hypertension Mother     Social History Social History   Tobacco Use   Smoking status: Every Day    Current packs/day: 0.00    Average packs/day: 0.3 packs/day for 9.0 years (2.3 ttl pk-yrs)    Types: Cigarettes    Start date: 06/10/2003    Last attempt to quit: 06/09/2012    Years since quitting: 11.1   Smokeless tobacco: Never   Tobacco comments:    pt reports is is smoking   Vaping Use   Vaping status: Never Used  Substance Use Topics   Alcohol use: No   Drug use: No     Allergies   Septra ds [sulfamethoxazole-trimethoprim]   Review of Systems Review of Systems  Constitutional:  Positive for fatigue. Negative for appetite change, chills and fever.  HENT:  Positive  for sore throat. Negative for congestion and ear pain.   Eyes:  Negative for pain and visual disturbance.  Respiratory:  Positive for cough. Negative for shortness of breath.   Cardiovascular:  Negative for chest pain and palpitations.  Gastrointestinal:  Negative for abdominal pain and vomiting.  Genitourinary:  Negative for dysuria and hematuria.  Musculoskeletal:  Negative for arthralgias and back pain.  Skin:  Negative for color change and rash.  Neurological:  Positive for headaches. Negative for seizures and syncope.  All other systems reviewed and are negative.    Physical Exam Triage Vital Signs ED Triage Vitals  Encounter Vitals Group     BP      Systolic BP Percentile      Diastolic BP Percentile      Pulse      Resp      Temp      Temp src      SpO2      Weight      Height      Head Circumference       Peak Flow      Pain Score      Pain Loc      Pain Education      Exclude from Growth Chart    No data found.  Updated Vital Signs There were no vitals taken for this visit.  Visual Acuity Right Eye Distance:   Left Eye Distance:   Bilateral Distance:    Right Eye Near:   Left Eye Near:    Bilateral Near:     Physical Exam Vitals and nursing note reviewed.  Constitutional:      General: She is not in acute distress.    Appearance: She is well-developed.  HENT:     Head: Normocephalic and atraumatic.     Right Ear: Tympanic membrane and ear canal normal.     Left Ear: Tympanic membrane and ear canal normal.     Mouth/Throat:     Mouth: Mucous membranes are moist.     Pharynx: Posterior oropharyngeal erythema (Very mild) present. No pharyngeal swelling or oropharyngeal exudate.  Eyes:     Conjunctiva/sclera: Conjunctivae normal.  Cardiovascular:     Rate and Rhythm: Normal rate and regular rhythm.     Heart sounds: No murmur heard. Pulmonary:     Effort: Pulmonary effort is normal. No respiratory distress.     Breath sounds: Normal breath sounds.  Abdominal:     Palpations: Abdomen is soft.     Tenderness: There is no abdominal tenderness.  Musculoskeletal:        General: No swelling.     Cervical back: Neck supple.  Skin:    General: Skin is warm and dry.     Capillary Refill: Capillary refill takes less than 2 seconds.  Neurological:     Mental Status: She is alert.  Psychiatric:        Mood and Affect: Mood normal.      UC Treatments / Results  Labs (all labs ordered are listed, but only abnormal results are displayed) Labs Reviewed - No data to display  EKG   Radiology No results found.  Procedures Procedures (including critical care time)  Medications Ordered in UC Medications - No data to display  Initial Impression / Assessment and Plan / UC Course  I have reviewed the triage vital signs and the nursing notes.  Pertinent labs & imaging  results that were available during my care of the patient  were reviewed by me and considered in my medical decision making (see chart for details).     Viral URI with cough   Upper respiratory infection likely secondary to viral infection. Can treat conservatively with alternating tylenol and ibuprofen. Can use Promethazine-DM 5mL every 6 hours as needed for cough. Use caution as this can make you sleepy.   Ok to gargle with salt water and to use over the counter lozenges   Return to urgent care or PCP if symptoms worsen or fail to resolve.    Final Clinical Impressions(s) / UC Diagnoses   Final diagnoses:  None   Discharge Instructions   None    ED Prescriptions   None    PDMP not reviewed this encounter.   Quintella Reichert 08/14/23 2034

## 2023-08-14 NOTE — ED Triage Notes (Signed)
Pt states sore throat,cough,headache,body aches,fatigue for the past 2 days.

## 2023-08-24 NOTE — Progress Notes (Signed)
HPI: Jennifer Leach is a 42 y.o. female with a PMHx significant for hidradenitis, GAD, chronic bilateral low back pain, depression, and insomnia, among others, who is here today for her routine physical.  Last CPE: More than one year ago  Exercise: Diet:  Sleep:  Alcohol Use:  Smoking: Vision:  Dental:  There is no immunization history for the selected administration types on file for this patient. Health Maintenance  Topic Date Due   DTaP/Tdap/Td (1 - Tdap) Never done   INFLUENZA VACCINE  Never done   COVID-19 Vaccine (1 - 2023-24 season) Never done   Cervical Cancer Screening (HPV/Pap Cotest)  09/20/2027   Hepatitis C Screening  Completed   HIV Screening  Completed   HPV VACCINES  Aged Out   Chronic medical problems: ***  Concerns today: ***  Review of Systems  Current Outpatient Medications on File Prior to Visit  Medication Sig Dispense Refill   amoxicillin-clavulanate (AUGMENTIN) 875-125 MG tablet Take 1 tablet by mouth 2 (two) times daily. 20 tablet 0   methylPREDNISolone (MEDROL DOSEPAK) 4 MG TBPK tablet Take as directed 21 tablet 0   promethazine-dextromethorphan (PROMETHAZINE-DM) 6.25-15 MG/5ML syrup Take 5 mLs by mouth every 6 (six) hours as needed for cough. 180 mL 0   No current facility-administered medications on file prior to visit.    Past Medical History:  Diagnosis Date   Bacterial vaginitis 03/04/2017   Gonorrhea    Labial swelling 03/04/2017   MRSA (methicillin resistant staph aureus) culture positive 08/06/2014    Past Surgical History:  Procedure Laterality Date   TUBAL LIGATION N/A 02/19/2017   Procedure: POST PARTUM TUBAL LIGATION;  Surgeon: [name], MD;  Location: [facility];  Service: Gynecology;  Laterality: N/A;   WISDOM TOOTH EXTRACTION      Allergies  Allergen Reactions   Septra Ds [Sulfamethoxazole-Trimethoprim] Anaphylaxis, Swelling, Rash and Other (See Comments)    Reaction:  Facial swelling      Family History  Problem Relation Age of Onset   Hypertension Mother     Social History   Socioeconomic History   Marital status: Single    Spouse name: Not on file   Number of children: Not on file   Years of education: Not on file   Highest education level: Not on file  Occupational History   Not on file  Tobacco Use   Smoking status: Every Day    Current packs/day: 0.00    Average packs/day: 0.3 packs/day for 9.0 years (2.3 ttl pk-yrs)    Types: Cigarettes    Start date: 06/10/2003    Last attempt to quit: 06/09/2012    Years since quitting: 11.2   Smokeless tobacco: Never   Tobacco comments:    pt reports is is smoking   Vaping Use   Vaping status: Never Used  Substance and Sexual Activity   Alcohol use: No   Drug use: No   Sexual activity: Yes    Partners: Male    Birth control/protection: Surgical  Other Topics Concern   Not on file  Social History Narrative   Not on file   Social Determinants of Health   Financial Resource Strain: Not on File (02/01/2022)   Received from [facility], [facility]    Financial Resource Strain: 0  Food Insecurity: Not on File (02/01/2022)   Received from [place], [place]   Food Insecurity    Food: 0  Transportation Needs: Not on File (02/01/2022)  Received from [place], [employer] Needs    Transportation: 0  Physical Activity: Not on File (02/01/2022)   Received from [place], [place]   Physical Activity    Physical Activity: 0  Stress: Not on File (02/01/2022)   Received from [facility], [place]   Stress    Stress: 0  Social Connections: Not on File (02/01/2022)   Received from [place], [place]   Social Connections    Social Connections and Isolation: 0    There were no vitals filed for this visit. There is no height or weight on file to calculate BMI.  Wt Readings from Last 3 Encounters:  07/31/23 174 lb (78.9 kg)  07/27/23 180 lb (81.6 kg)  07/06/23 174 lb (78.9 kg)    Physical Exam  ASSESSMENT  AND PLAN:  Jennifer Leach was here today for her annual physical examination.  No orders of the defined types were placed in this encounter.   @ASSESSPLAN @  There are no diagnoses linked to this encounter.  No follow-ups on file.  I, Suanne Marker, acting as a scribe for Carlosdaniel Grob [place], MD., have documented all relevant documentation on the behalf of Jennifer Clodfelter [place], MD, as directed by  Jennifer Chowning [place], MD while in the presence of Jennifer Hausner [place], MD.   I, Suanne Marker, have reviewed all documentation for this visit. The documentation on 08/24/23 for the exam, diagnosis, procedures, and orders are all accurate and complete.  Tymothy Cass G. [place], MD  [facility] [place] office.

## 2023-08-27 ENCOUNTER — Encounter: Payer: Self-pay | Admitting: Physical Therapy

## 2023-08-27 ENCOUNTER — Ambulatory Visit (INDEPENDENT_AMBULATORY_CARE_PROVIDER_SITE_OTHER): Payer: Medicaid Other | Admitting: Family Medicine

## 2023-08-27 ENCOUNTER — Ambulatory Visit: Payer: Medicaid Other | Attending: Family Medicine | Admitting: Physical Therapy

## 2023-08-27 ENCOUNTER — Other Ambulatory Visit: Payer: Self-pay

## 2023-08-27 ENCOUNTER — Encounter: Payer: Self-pay | Admitting: Family Medicine

## 2023-08-27 ENCOUNTER — Telehealth: Payer: Self-pay

## 2023-08-27 VITALS — BP 122/80 | HR 70 | Temp 98.1°F | Resp 12 | Ht 67.0 in | Wt 180.0 lb

## 2023-08-27 DIAGNOSIS — Z13220 Encounter for screening for lipoid disorders: Secondary | ICD-10-CM

## 2023-08-27 DIAGNOSIS — Z130 Encounter for screening for diseases of the blood and blood-forming organs and certain disorders involving the immune mechanism: Secondary | ICD-10-CM | POA: Diagnosis not present

## 2023-08-27 DIAGNOSIS — Z23 Encounter for immunization: Secondary | ICD-10-CM

## 2023-08-27 DIAGNOSIS — N938 Other specified abnormal uterine and vaginal bleeding: Secondary | ICD-10-CM | POA: Diagnosis not present

## 2023-08-27 DIAGNOSIS — M5459 Other low back pain: Secondary | ICD-10-CM | POA: Diagnosis present

## 2023-08-27 DIAGNOSIS — M6281 Muscle weakness (generalized): Secondary | ICD-10-CM | POA: Insufficient documentation

## 2023-08-27 DIAGNOSIS — M545 Low back pain, unspecified: Secondary | ICD-10-CM | POA: Diagnosis not present

## 2023-08-27 DIAGNOSIS — Z0000 Encounter for general adult medical examination without abnormal findings: Secondary | ICD-10-CM | POA: Diagnosis not present

## 2023-08-27 DIAGNOSIS — G8929 Other chronic pain: Secondary | ICD-10-CM

## 2023-08-27 DIAGNOSIS — D500 Iron deficiency anemia secondary to blood loss (chronic): Secondary | ICD-10-CM | POA: Insufficient documentation

## 2023-08-27 DIAGNOSIS — R413 Other amnesia: Secondary | ICD-10-CM | POA: Diagnosis not present

## 2023-08-27 DIAGNOSIS — R252 Cramp and spasm: Secondary | ICD-10-CM

## 2023-08-27 DIAGNOSIS — G4700 Insomnia, unspecified: Secondary | ICD-10-CM

## 2023-08-27 DIAGNOSIS — Z13228 Encounter for screening for other metabolic disorders: Secondary | ICD-10-CM

## 2023-08-27 DIAGNOSIS — Z1329 Encounter for screening for other suspected endocrine disorder: Secondary | ICD-10-CM | POA: Diagnosis not present

## 2023-08-27 LAB — CBC
HCT: 41.6 % (ref 36.0–46.0)
Hemoglobin: 13.8 g/dL (ref 12.0–15.0)
MCHC: 33.2 g/dL (ref 30.0–36.0)
MCV: 90.6 fL (ref 78.0–100.0)
Platelets: 298 10*3/uL (ref 150.0–400.0)
RBC: 4.59 Mil/uL (ref 3.87–5.11)
RDW: 13.7 % (ref 11.5–15.5)
WBC: 6 10*3/uL (ref 4.0–10.5)

## 2023-08-27 LAB — COMPREHENSIVE METABOLIC PANEL
ALT: 15 U/L (ref 0–35)
AST: 21 U/L (ref 0–37)
Albumin: 3.9 g/dL (ref 3.5–5.2)
Alkaline Phosphatase: 69 U/L (ref 39–117)
BUN: 10 mg/dL (ref 6–23)
CO2: 25 meq/L (ref 19–32)
Calcium: 8.7 mg/dL (ref 8.4–10.5)
Chloride: 106 meq/L (ref 96–112)
Creatinine, Ser: 0.88 mg/dL (ref 0.40–1.20)
GFR: 82.21 mL/min (ref >60.00)
Glucose, Bld: 91 mg/dL (ref 70–99)
Potassium: 4.1 meq/L (ref 3.5–5.1)
Sodium: 138 meq/L (ref 135–145)
Total Bilirubin: 1 mg/dL (ref 0.2–1.2)
Total Protein: 7.3 g/dL (ref 6.0–8.3)

## 2023-08-27 LAB — FERRITIN: Ferritin: 6.4 ng/mL — ABNORMAL LOW (ref 10.0–291.0)

## 2023-08-27 LAB — LIPID PANEL
Cholesterol: 168 mg/dL (ref 0–200)
HDL: 30.6 mg/dL — ABNORMAL LOW (ref >39.00)
LDL Cholesterol: 121 mg/dL — ABNORMAL HIGH (ref 0–99)
NonHDL: 137.07
Total CHOL/HDL Ratio: 5
Triglycerides: 79 mg/dL (ref 0.0–149.0)
VLDL: 15.8 mg/dL (ref 0.0–40.0)

## 2023-08-27 LAB — IRON: Iron: 93 ug/dL (ref 42–145)

## 2023-08-27 LAB — VITAMIN B12: Vitamin B-12: 195 pg/mL — ABNORMAL LOW (ref 211–911)

## 2023-08-27 NOTE — Therapy (Signed)
OUTPATIENT PHYSICAL THERAPY THORACOLUMBAR EVALUATION   Patient Name: Jennifer Leach MRN: [id number] DOB:[birth year], 42 y.o., female Today's Date: 08/27/2023  END OF SESSION:  PT End of Session - 08/27/23 1234     Visit Number 1    Date for PT Re-Evaluation 10/09/23    Authorization Type [employer]    Authorization Time Period requested 12 visits 08/28/23 to 10/09/23    PT Start Time 1234    PT Stop Time 1319    PT Time Calculation (min) 45 min    Activity Tolerance Patient tolerated treatment well    Behavior During Therapy [facility] for tasks assessed/performed             Past Medical History:  Diagnosis Date   Bacterial vaginitis 03/04/2017   Gonorrhea    Labial swelling 03/04/2017   MRSA (methicillin resistant staph aureus) culture positive 08/06/2014   Past Surgical History:  Procedure Laterality Date   TUBAL LIGATION N/A 02/19/2017   Procedure: POST PARTUM TUBAL LIGATION;  Surgeon: [name], MD;  Location: [facility];  Service: Gynecology;  Laterality: N/A;   WISDOM TOOTH EXTRACTION     Patient Active Problem List   Diagnosis Date Noted   Iron deficiency anemia due to chronic blood loss 08/27/2023   Routine general medical examination at a health care facility 08/27/2023   Chronic bilateral low back pain 02/05/2023   GAD (generalized anxiety disorder) 09/25/2022   Insomnia 09/25/2022   Depression, major, recurrent, moderate (HCC) 09/25/2022   Breakthrough bleeding on Depo-Provera 04/14/2020   Hidradenitis 04/14/2020   Gonorrhea 11/20/2019   DUB (dysfunctional uterine bleeding) 11/19/2018   H/O tubal ligation 03/15/2017   Bacterial vaginitis 03/04/2017   Labial swelling 03/04/2017    PCP: [place], Betty G, MD   REFERRING PROVIDER: [place], Betty G, MD   REFERRING DIAG: [phone] (ICD-10-CM) - Chronic bilateral low back pain without sciatica   Rationale for Evaluation and Treatment: Rehabilitation  THERAPY DIAG:  Other low back  pain - Plan: PT plan of care cert/re-cert  Cramp and spasm - Plan: PT plan of care cert/re-cert  Muscle weakness (generalized) - Plan: PT plan of care cert/re-cert  ONSET DATE: May 2023  SUBJECTIVE:                                                                                                                                                                                           SUBJECTIVE STATEMENT: Had pain since young. Sometimes get sharp pain in the middle of the back and I can't move. Today it's bad since I woke up. Sit to stand, sitting and walking hurts. I  have to lie on certain angle in order to sleep. At work hurts to scrub floors. Pain used to go away, but now it's staying.   PERTINENT HISTORY:  Chronic LBP  PAIN:  Are you having pain? Yes: NPRS scale: 8 today up to 10/10 Pain location: mid low back; moves up Tspine and down to tailbone Pain description: sharp Aggravating factors: sit to stand, prolonged sitting and walking Relieving factors: heat  PRECAUTIONS: None  RED FLAGS: None   WEIGHT BEARING RESTRICTIONS: No  FALLS:  Has patient fallen in last 6 months? No  LIVING ENVIRONMENT: Lives with: lives with their family Lives in: House/apartment Stairs: Yes: Internal: 13 steps; on right going up Has following equipment at home: None  OCCUPATION: works in Recruitment consultant at [employer]; has to scrub the floor with mop  PLOF: Independent  PATIENT GOALS: Get my back together and learn some stuff to help it.  NEXT MD VISIT: none scheduled  OBJECTIVE:  Note: Objective measures were completed at Evaluation unless otherwise noted.  DIAGNOSTIC FINDINGS:  XR: Alignment is anatomic. Vertebral body and disc space heights are maintained. No pars defects. No degenerative changes.  PATIENT SURVEYS:  ODI   29 / 50 or 58 %  COGNITION: Overall cognitive status: Within functional limits for tasks assessed     SENSATION: WFL  MUSCLE LENGTH: Tight  left HF, B HS R>L  POSTURE: forward head and increased lumbar lordosis  PALPATION: Left upper lumbar and lower thoracic paraspinals tight and tender UPA L L1/2, L2/3 painful  LUMBAR ROM:   AROM eval  Flexion 60% *  Extension Full *  Right lateral flexion Full  Left lateral flexion 75%* left  Right rotation full  Left rotation Full but spasms L QL   (Blank rows = not tested)  LOWER EXTREMITY ROM:   WFL  LOWER EXTREMITY MMT:    MMT Right eval Left eval  Hip flexion 5 5  Hip extension 5 4-  Hip abduction 5 5  Hip adduction 5 5  Hip internal rotation    Hip external rotation    Knee flexion 4+ 4  Knee extension 4+ 4+  Ankle dorsiflexion 5 5  Ankle plantarflexion    Ankle inversion    Ankle eversion     (Blank rows = not tested)  LUMBAR SPECIAL TESTS:  Straight leg raise test: Negative, Slump test: Positive, and Quadrant test: Positive  Slump R pos, Quadrant L positive    TODAY'S TREATMENT:                                                                                                                              DATE:   08/27/23 See pt ed and HEP  If treatment provided at initial evaluation, no treatment charged due to lack of authorization.    PATIENT EDUCATION:  Education details: PT eval findings, anticipated POC, and initial HEP  Person educated: Patient Education method: Explanation, Demonstration,  and Handouts Education comprehension: verbalized understanding and returned demonstration  HOME EXERCISE PROGRAM: Access Code: [email or web address] URL: [email or web address] Date: 08/27/2023 Prepared by: Raynelle Fanning  Exercises - Supine Lower Trunk Rotation  - 2 x daily - 7 x weekly - 1 sets - 5 reps - 10 sec hold - Child's Pose Stretch  - 2 x daily - 7 x weekly - 1 sets - 2 reps - 20 sec hold - Child's Pose with Sidebending  - 2 x daily - 7 x weekly - 1 sets - 2 reps - 20 sec hold Pelvic Press     Place hands under belly between navel and pubic bone,  palms up. Feel pressure on hands. Increase pressure on hands by pressing pelvis down. This is NOT a pelvic tilt. Hold __5_ seconds. Relax. Repeat _10__ times. Once a day.  KNEE: Flexion - Prone   Hold pelvic press. Bend knee, then return the foot down. Repeat on opposite leg. Do not raise hips. _10__ reps per set. When this is mastered, pull both heels up at same time, x 10 reps.  Once a day   Leg Lift: One-Leg   Press pelvis down. Keep knee straight; lengthen and lift one leg (from waist). Do not twist body. Keep other leg down. Hold _1__ seconds. Relax. Repeat 10 time. Repeat with other leg. ASSESSMENT:  CLINICAL IMPRESSION: Patient is a 42 y.o. female who was seen today for physical therapy evaluation and treatment for low back pain. She is having spasms in the left lumbar causing pain bilaterally and affecting ADLS. will benefit from skilled PT to address the deficits below.    OBJECTIVE IMPAIRMENTS: decreased activity tolerance, difficulty walking, decreased ROM, decreased strength, increased muscle spasms, impaired flexibility, improper body mechanics, postural dysfunction, and pain.   ACTIVITY LIMITATIONS: carrying, lifting, bending, sitting, standing, sleeping, transfers, and locomotion level  PARTICIPATION LIMITATIONS: cleaning, interpersonal relationship, and occupation  PERSONAL FACTORS: Profession and Time since onset of injury/illness/exacerbation are also affecting patient's functional outcome.   REHAB POTENTIAL: Excellent  CLINICAL DECISION MAKING: Stable/uncomplicated  EVALUATION COMPLEXITY: Low   GOALS: Goals reviewed with patient? Yes  SHORT TERM GOALS: Target date: 09/18/2023   Patient will be independent with initial HEP.  Baseline: no HEP Goal status: INITIAL  2.  Decreased pain and spasms by 30% Baseline: 8 to 10/10 Goal status: INITIAL   LONG TERM GOALS: Target date: 10/09/2023   Patient will be independent with advanced/ongoing HEP to improve  outcomes and carryover.  Baseline: initial HEP Goal status: INITIAL  2.  Patient will report 75% improvement in low back pain to improve QOL.  Baseline: 8 to 10/10 Goal status: INITIAL  3.  Patient will report <= 23 on ODI to demonstrate improved functional ability.  Baseline: 29 / 50 Goal status: INITIAL  4.  Patient will demonstrate improved LE strength to 5/5 to allow for correct body mechanics. Baseline: see objective chart Goal status: INITIAL  5. Patient will be able to perform sit to stand transfers with no increased pain. Baseline: 8 to 10/10 with sit to stand Goal status: INITIAL   PLAN:  PT FREQUENCY: 2x/week  PT DURATION: 6 weeks  PLANNED INTERVENTIONS: 97164- PT Re-evaluation, 97110-Therapeutic exercises, 97530- Therapeutic activity, [id number]- Neuromuscular re-education, 97535- Self Care, 16109- Manual therapy, [id number]- Aquatic Therapy, 97014- Electrical stimulation (unattended), [id number]- Traction (mechanical), Patient/Family education, Taping, Dry Needling, Joint mobilization, Spinal mobilization, Cryotherapy, and Moist heat.  PLAN FOR NEXT SESSION: Review and progress HEP, gentle lumbar ROM, core and lumbar stab, stretch  L hip flexor, B HS   Solon Palm, PT  08/27/2023, 1:50 PM

## 2023-08-27 NOTE — Assessment & Plan Note (Signed)
Problem has improved with hormonal therapy but has not resolved. She has an appointment with her gynecologist next month. Instructed about warning signs.

## 2023-08-27 NOTE — Patient Instructions (Addendum)
A few things to remember from today's visit:  Routine general medical examination at a health care facility  Screening for lipoid disorders - Plan: Lipid panel  Screening for endocrine, metabolic and immunity disorder - Plan: Comprehensive metabolic panel  Memory difficulties - Plan: Vitamin B12, Comprehensive metabolic panel  DUB (dysfunctional uterine bleeding)  Iron deficiency anemia due to chronic blood loss - Plan: CBC, Iron, Ferritin  Chronic bilateral low back pain without sciatica - Plan: Ambulatory referral to Physical Therapy Please re-schedule your mammogram, this is the phone number: [phone]. Memory difficulties at your age could be related to anxiety and sleep. Call and arrange an appt with psychotherapist. Melatonin 2 hours before bedtime may help, 5-15 mg.  If you need refills for medications you take chronically, please call your pharmacy. Do not use My Chart to request refills or for acute issues that need immediate attention. If you send a my chart message, it may take a few days to be addressed, specially if I am not in the office.  Please be sure medication list is accurate. If a new problem present, please set up appointment sooner than planned today.  Health Maintenance, Female Adopting a healthy lifestyle and getting preventive care are important in promoting health and wellness. Ask your health care provider about: The right schedule for you to have regular tests and exams. Things you can do on your own to prevent diseases and keep yourself healthy. What should I know about diet, weight, and exercise? Eat a healthy diet  Eat a diet that includes plenty of vegetables, fruits, low-fat dairy products, and lean protein. Do not eat a lot of foods that are high in solid fats, added sugars, or sodium. Maintain a healthy weight Body mass index (BMI) is used to identify weight problems. It estimates body fat based on height and weight. Your health care  provider can help determine your BMI and help you achieve or maintain a healthy weight. Get regular exercise Get regular exercise. This is one of the most important things you can do for your health. Most adults should: Exercise for at least 150 minutes each week. The exercise should increase your heart rate and make you sweat (moderate-intensity exercise). Do strengthening exercises at least twice a week. This is in addition to the moderate-intensity exercise. Spend less time sitting. Even light physical activity can be beneficial. Watch cholesterol and blood lipids Have your blood tested for lipids and cholesterol at 40 years of age, then have this test every 5 years. Have your cholesterol levels checked more often if: Your lipid or cholesterol levels are high. You are older than 40 years of age. You are at high risk for heart disease. What should I know about cancer screening? Depending on your health history and family history, you may need to have cancer screening at various ages. This may include screening for: Breast cancer. Cervical cancer. Colorectal cancer. Skin cancer. Lung cancer. What should I know about heart disease, diabetes, and high blood pressure? Blood pressure and heart disease High blood pressure causes heart disease and increases the risk of stroke. This is more likely to develop in people who have high blood pressure readings or are overweight. Have your blood pressure checked: Every 3-5 years if you are 63-49 years of age. Every year if you are 56 years old or older. Diabetes Have regular diabetes screenings. This checks your fasting blood sugar level. Have the screening done: Once every three years after age 67 if you are  at a normal weight and have a low risk for diabetes. More often and at a younger age if you are overweight or have a high risk for diabetes. What should I know about preventing infection? Hepatitis B If you have a higher risk for hepatitis B,  you should be screened for this virus. Talk with your health care provider to find out if you are at risk for hepatitis B infection. Hepatitis C Testing is recommended for: Everyone born from [birth year] through 1963. Anyone with known risk factors for hepatitis C. Sexually transmitted infections (STIs) Get screened for STIs, including gonorrhea and chlamydia, if: You are sexually active and are younger than 40 years of age. You are older than 40 years of age and your health care provider tells you that you are at risk for this type of infection. Your sexual activity has changed since you were last screened, and you are at increased risk for chlamydia or gonorrhea. Ask your health care provider if you are at risk. Ask your health care provider about whether you are at high risk for HIV. Your health care provider may recommend a prescription medicine to help prevent HIV infection. If you choose to take medicine to prevent HIV, you should first get tested for HIV. You should then be tested every 3 months for as long as you are taking the medicine. Pregnancy If you are about to stop having your period (premenopausal) and you may become pregnant, seek counseling before you get pregnant. Take 400 to 800 micrograms (mcg) of folic acid every day if you become pregnant. Ask for birth control (contraception) if you want to prevent pregnancy. Osteoporosis and menopause Osteoporosis is a disease in which the bones lose minerals and strength with aging. This can result in bone fractures. If you are 84 years old or older, or if you are at risk for osteoporosis and fractures, ask your health care provider if you should: Be screened for bone loss. Take a calcium or vitamin D supplement to lower your risk of fractures. Be given hormone replacement therapy (HRT) to treat symptoms of menopause. Follow these instructions at home: Alcohol use Do not drink alcohol if: Your health care provider tells you not to  drink. You are pregnant, may be pregnant, or are planning to become pregnant. If you drink alcohol: Limit how much you have to: 0-1 drink a day. Know how much alcohol is in your drink. In the [place] one drink equals one 12 oz bottle of beer (355 mL), one 5 oz glass of wine (148 mL), or one 1 oz glass of hard liquor (44 mL). Lifestyle Do not use any products that contain nicotine or tobacco. These products include cigarettes, chewing tobacco, and vaping devices, such as e-cigarettes. If you need help quitting, ask your health care provider. Do not use street drugs. Do not share needles. Ask your health care provider for help if you need support or information about quitting drugs. General instructions Schedule regular health, dental, and eye exams. Stay current with your vaccines. Tell your health care provider if: You often feel depressed. You have ever been abused or do not feel safe at home. Summary Adopting a healthy lifestyle and getting preventive care are important in promoting health and wellness. Follow your health care provider's instructions about healthy diet, exercising, and getting tested or screened for diseases. Follow your health care provider's instructions on monitoring your cholesterol and blood pressure. This information is not intended to replace advice given to you by  your health care provider. Make sure you discuss any questions you have with your health care provider. Document Revised: 02/14/2021 Document Reviewed: 02/14/2021 Elsevier Patient Education  2024 [employer].

## 2023-08-27 NOTE — Telephone Encounter (Signed)
Patient called in to schedule her 1st B12 injection. Pt also had questions about her other labs and asked if we could review them during her nurse visit, advised that we could and she is scheduled for 9 am tomorrow morning.

## 2023-08-27 NOTE — Patient Instructions (Addendum)
Pelvic Press     Place hands under belly between navel and pubic bone, palms up. Feel pressure on hands. Increase pressure on hands by pressing pelvis down. This is NOT a pelvic tilt. Hold __5_ seconds. Relax. Repeat _10__ times. Once a day.  KNEE: Flexion - Prone   Hold pelvic press. Bend knee, then return the foot down. Repeat on opposite leg. Do not raise hips. _10__ reps per set. When this is mastered, pull both heels up at same time, x 10 reps.  Once a day   Leg Lift: One-Leg   Press pelvis down. Keep knee straight; lengthen and lift one leg (from waist). Do not twist body. Keep other leg down. Hold _1__ seconds. Relax. Repeat 10 time. Repeat with other leg.

## 2023-08-27 NOTE — Assessment & Plan Note (Signed)
We discussed diagnosis and prognosis. She agrees with trying PT. I will obtain imaging is needed at this time, it was done in 02/2023 and negative.

## 2023-08-27 NOTE — Assessment & Plan Note (Signed)
Mild and attributed to abnormal vaginal bleeding. She is not on iron supplementation. Further recommendation will be given according to lab results.

## 2023-08-27 NOTE — Assessment & Plan Note (Signed)
We discussed the importance of regular physical activity and healthy diet for prevention of chronic illness and/or complications. Preventive guidelines reviewed. Vaccination up-to-date. Continue her female preventive care with gynecologist. Next CPE in a year. 

## 2023-08-28 ENCOUNTER — Ambulatory Visit (INDEPENDENT_AMBULATORY_CARE_PROVIDER_SITE_OTHER): Payer: Medicaid Other

## 2023-08-28 DIAGNOSIS — E538 Deficiency of other specified B group vitamins: Secondary | ICD-10-CM | POA: Diagnosis not present

## 2023-08-28 MED ORDER — CYANOCOBALAMIN 1000 MCG/ML IJ SOLN
1000.0000 ug | Freq: Once | INTRAMUSCULAR | Status: AC
  Administered 2023-08-28: 1000 ug via INTRAMUSCULAR

## 2023-08-28 MED ORDER — IRON (FERROUS SULFATE) 325 (65 FE) MG PO TABS: 325.0000 mg | ORAL_TABLET | ORAL | 3 refills | Status: AC

## 2023-08-28 NOTE — Progress Notes (Signed)
Per orders of Dr. Jordan, injection of B12 given by Malayia Spizzirri E Leira Regino. Patient tolerated injection well.  

## 2023-09-04 ENCOUNTER — Encounter: Payer: Medicaid Other | Admitting: Physical Therapy

## 2023-09-04 ENCOUNTER — Ambulatory Visit (INDEPENDENT_AMBULATORY_CARE_PROVIDER_SITE_OTHER): Payer: Medicaid Other

## 2023-09-04 DIAGNOSIS — E538 Deficiency of other specified B group vitamins: Secondary | ICD-10-CM | POA: Diagnosis not present

## 2023-09-04 MED ORDER — CYANOCOBALAMIN 1000 MCG/ML IJ SOLN
1000.0000 ug | Freq: Once | INTRAMUSCULAR | Status: AC
  Administered 2023-09-04: 1000 ug via INTRAMUSCULAR

## 2023-09-04 NOTE — Progress Notes (Signed)
Per orders of Dr. [place], injection of B12 given by Kathreen Devoid. Patient tolerated injection well.

## 2023-09-11 ENCOUNTER — Ambulatory Visit: Payer: Medicaid Other | Attending: Family Medicine | Admitting: Rehabilitative and Restorative Service Providers"

## 2023-09-11 ENCOUNTER — Ambulatory Visit: Payer: Medicaid Other

## 2023-09-11 ENCOUNTER — Ambulatory Visit (INDEPENDENT_AMBULATORY_CARE_PROVIDER_SITE_OTHER): Payer: Medicaid Other

## 2023-09-11 DIAGNOSIS — E538 Deficiency of other specified B group vitamins: Secondary | ICD-10-CM

## 2023-09-11 DIAGNOSIS — M6281 Muscle weakness (generalized): Secondary | ICD-10-CM | POA: Insufficient documentation

## 2023-09-11 DIAGNOSIS — M5459 Other low back pain: Secondary | ICD-10-CM | POA: Insufficient documentation

## 2023-09-11 NOTE — Progress Notes (Signed)
Patient came in for her 3rd B12 injection. Prior to injection, patient wanted to discuss the itchiness she has been having all over her skin. She did read that it could be a side effect of the injections. She opted to go ahead and switch to the oral 1000 mcg over the counter. Advised side effects should clear up on their own now that the injections have stopped, but to let us know if they continue. Pt verbalized understanding.

## 2023-09-12 NOTE — Therapy (Incomplete)
OUTPATIENT PHYSICAL THERAPY THORACOLUMBAR TREATMENT   Patient Name: Jennifer Leach MRN: [id number] DOB:Jun 28, 1981, 42 y.o., female Today's Date: 09/12/2023  END OF SESSION:    Past Medical History:  Diagnosis Date   Bacterial vaginitis 03/04/2017   Gonorrhea    Labial swelling 03/04/2017   MRSA (methicillin resistant staph aureus) culture positive 08/06/2014   Past Surgical History:  Procedure Laterality Date   TUBAL LIGATION N/A 02/19/2017   Procedure: POST PARTUM TUBAL LIGATION;  Surgeon: [name], MD;  [facility]: [facility];  Service: Gynecology;  Laterality: N/A;   WISDOM TOOTH EXTRACTION     Patient Active Problem List   Diagnosis Date Noted   Iron deficiency anemia due to chronic blood loss 08/27/2023   Routine general medical examination at a health care facility 08/27/2023   Chronic bilateral low back pain 02/05/2023   GAD (generalized anxiety disorder) 09/25/2022   Insomnia 09/25/2022   Depression, major, recurrent, moderate (HCC) 09/25/2022   Breakthrough bleeding on Depo-Provera 04/14/2020   Hidradenitis 04/14/2020   Gonorrhea 11/20/2019   DUB (dysfunctional uterine bleeding) 11/19/2018   H/O tubal ligation 03/15/2017   Bacterial vaginitis 03/04/2017   Labial swelling 03/04/2017    PCP: [place], Betty G, MD   REFERRING PROVIDER: [place], Betty G, MD   REFERRING DIAG: M54.50,G89.29 (ICD-10-CM) - Chronic bilateral low back pain without sciatica   Rationale for Evaluation and Treatment: Rehabilitation  THERAPY DIAG:  No diagnosis found.  ONSET DATE: May 2023  SUBJECTIVE:                                                                                                                                                                                           SUBJECTIVE STATEMENT: ***  PERTINENT HISTORY:  Chronic LBP  PAIN:  Are you having pain? Yes: NPRS scale: 8 today up to 10/10 Pain location: mid low back; moves up Tspine and down  to tailbone Pain description: sharp Aggravating factors: sit to stand, prolonged sitting and walking Relieving factors: heat  PRECAUTIONS: None  RED FLAGS: None   WEIGHT BEARING RESTRICTIONS: No  FALLS:  Has patient fallen in last 6 months? No  LIVING ENVIRONMENT: Lives with: lives with their family Lives in: House/apartment Stairs: Yes: Internal: 13 steps; on right going up Has following equipment at home: None  OCCUPATION: works in Recruitment consultant at [employer], [employer]; has to scrub the floor with mop  PLOF: Independent  PATIENT GOALS: Get my back together and learn some stuff to help it.  NEXT MD VISIT: none scheduled  OBJECTIVE:  Note: Objective measures were completed at Evaluation unless otherwise noted.  DIAGNOSTIC FINDINGS:  XR: Alignment is anatomic. Vertebral body and disc space heights are maintained. No pars defects. No degenerative changes.  PATIENT SURVEYS:  ODI   29 / 50 or 58 %  COGNITION: Overall cognitive status: Within functional limits for tasks assessed     SENSATION: WFL  MUSCLE LENGTH: Tight left HF, B HS R>L  POSTURE: forward head and increased lumbar lordosis  PALPATION: Left upper lumbar and lower thoracic paraspinals tight and tender UPA L L1/2, L2/3 painful  LUMBAR ROM:   AROM eval  Flexion 60% *  Extension Full *  Right lateral flexion Full  Left lateral flexion 75%* left  Right rotation full  Left rotation Full but spasms L QL   (Blank rows = not tested)  LOWER EXTREMITY ROM:   WFL  LOWER EXTREMITY MMT:    MMT Right eval Left eval  Hip flexion 5 5  Hip extension 5 4-  Hip abduction 5 5  Hip adduction 5 5  Hip internal rotation    Hip external rotation    Knee flexion 4+ 4  Knee extension 4+ 4+  Ankle dorsiflexion 5 5  Ankle plantarflexion    Ankle inversion    Ankle eversion     (Blank rows = not tested)  LUMBAR SPECIAL TESTS:  Straight leg raise test: Negative, Slump test: Positive, and Quadrant  test: Positive  Slump R pos, Quadrant L positive    TODAY'S TREATMENT:                                                                                                                              DATE:   09/13/23 ***gentle lumbar ROM, core and lumbar stab, stretch L hip flexor, B HS LTR Child's pose 3 way Prone pelvic press series  08/27/23 See pt ed and HEP  If treatment provided at initial evaluation, no treatment charged due to lack of authorization.    PATIENT EDUCATION:  Education details: PT eval findings, anticipated POC, and initial HEP  Person educated: Patient Education method: Explanation, Demonstration, and Handouts Education comprehension: verbalized understanding and returned demonstration  HOME EXERCISE PROGRAM: Access Code: [email or web address] URL: [email or web address] Date: 08/27/2023 Prepared by: Raynelle Fanning  Exercises - Supine Lower Trunk Rotation  - 2 x daily - 7 x weekly - 1 sets - 5 reps - 10 sec hold - Child's Pose Stretch  - 2 x daily - 7 x weekly - 1 sets - 2 reps - 20 sec hold - Child's Pose with Sidebending  - 2 x daily - 7 x weekly - 1 sets - 2 reps - 20 sec hold Pelvic Press     Place hands under belly between navel and pubic bone, palms up. Feel pressure on hands. Increase pressure on hands by pressing pelvis down. This is NOT a pelvic tilt. Hold __5_ seconds. Relax. Repeat _10__ times. Once a day.  KNEE: Flexion - Prone   Hold pelvic press. Bend knee, then return the foot down.  Repeat on opposite leg. Do not raise hips. _10__ reps per set. When this is mastered, pull both heels up at same time, x 10 reps.  Once a day   Leg Lift: One-Leg   Press pelvis down. Keep knee straight; lengthen and lift one leg (from waist). Do not twist body. Keep other leg down. Hold _1__ seconds. Relax. Repeat 10 time. Repeat with other leg. ASSESSMENT:  CLINICAL IMPRESSION: ***   OBJECTIVE IMPAIRMENTS: decreased activity tolerance, difficulty walking,  decreased ROM, decreased strength, increased muscle spasms, impaired flexibility, improper body mechanics, postural dysfunction, and pain.   ACTIVITY LIMITATIONS: carrying, lifting, bending, sitting, standing, sleeping, transfers, and locomotion level  PARTICIPATION LIMITATIONS: cleaning, interpersonal relationship, and occupation  PERSONAL FACTORS: Profession and Time since onset of injury/illness/exacerbation are also affecting patient's functional outcome.   REHAB POTENTIAL: Excellent  CLINICAL DECISION MAKING: Stable/uncomplicated  EVALUATION COMPLEXITY: Low   GOALS: Goals reviewed with patient? Yes  SHORT TERM GOALS: Target date: 09/18/2023   Patient will be independent with initial HEP.  Baseline: no HEP Goal status: INITIAL  2.  Decreased pain and spasms by 30% Baseline: 8 to 10/10 Goal status: INITIAL   LONG TERM GOALS: Target date: 10/09/2023   Patient will be independent with advanced/ongoing HEP to improve outcomes and carryover.  Baseline: initial HEP Goal status: INITIAL  2.  Patient will report 75% improvement in low back pain to improve QOL.  Baseline: 8 to 10/10 Goal status: INITIAL  3.  Patient will report <= 23 on ODI to demonstrate improved functional ability.  Baseline: 29 / 50 Goal status: INITIAL  4.  Patient will demonstrate improved LE strength to 5/5 to allow for correct body mechanics. Baseline: see objective chart Goal status: INITIAL  5. Patient will be able to perform sit to stand transfers with no increased pain. Baseline: 8 to 10/10 with sit to stand Goal status: INITIAL   PLAN:  PT FREQUENCY: 2x/week  PT DURATION: 6 weeks  PLANNED INTERVENTIONS: 97164- PT Re-evaluation, 97110-Therapeutic exercises, 97530- Therapeutic activity, [id number]- Neuromuscular re-education, 97535- Self Care, 09604- Manual therapy, [id number]- Aquatic Therapy, 97014- Electrical stimulation (unattended), [id number]- Traction (mechanical), Patient/Family education,  Taping, Dry Needling, Joint mobilization, Spinal mobilization, Cryotherapy, and Moist heat.  PLAN FOR NEXT SESSION: Review and progress HEP, gentle lumbar ROM, core and lumbar stab, stretch L hip flexor, B HS   Solon Palm, PT  09/12/2023, 3:18 PM

## 2023-09-13 ENCOUNTER — Encounter: Payer: Medicaid Other | Admitting: Rehabilitative and Restorative Service Providers"

## 2023-09-13 ENCOUNTER — Ambulatory Visit: Payer: Medicaid Other | Admitting: Physical Therapy

## 2023-09-13 ENCOUNTER — Telehealth: Payer: Self-pay | Admitting: Physical Therapy

## 2023-09-13 NOTE — Telephone Encounter (Signed)
Phoned patient regarding missed appt today. Left message letting her know of next appt on 12/10 at 8.

## 2023-09-17 ENCOUNTER — Encounter: Payer: Self-pay | Admitting: Obstetrics and Gynecology

## 2023-09-17 ENCOUNTER — Ambulatory Visit: Payer: Medicaid Other | Admitting: Obstetrics and Gynecology

## 2023-09-17 ENCOUNTER — Other Ambulatory Visit (HOSPITAL_COMMUNITY)
Admission: RE | Admit: 2023-09-17 | Discharge: 2023-09-17 | Disposition: A | Discharge status: Home / Self Care | Payer: Medicaid Other | Source: Ambulatory Visit | Attending: Obstetrics and Gynecology | Admitting: Obstetrics and Gynecology

## 2023-09-17 VITALS — BP 119/80 | HR 81 | Ht 62.0 in | Wt 182.0 lb

## 2023-09-17 DIAGNOSIS — N939 Abnormal uterine and vaginal bleeding, unspecified: Secondary | ICD-10-CM | POA: Diagnosis not present

## 2023-09-17 DIAGNOSIS — Z30430 Encounter for insertion of intrauterine contraceptive device: Secondary | ICD-10-CM

## 2023-09-17 DIAGNOSIS — N898 Other specified noninflammatory disorders of vagina: Secondary | ICD-10-CM

## 2023-09-17 MED ORDER — LEVONORGESTREL 20 MCG/DAY IU IUD
1.0000 | INTRAUTERINE_SYSTEM | Freq: Once | INTRAUTERINE | Status: AC
  Administered 2023-09-17: 1 via INTRAUTERINE

## 2023-09-17 NOTE — Patient Instructions (Signed)
Please call if: - bleeding heavily - soaking a pad in less than 1 hour - severe uncontrolled pain even after taking tylenol & ibuprofen - you have a fever

## 2023-09-17 NOTE — Progress Notes (Signed)
    GYNECOLOGY OFFICE PROCEDURE NOTE  Jennifer Leach is a 42 y.o. [phone] here for IUD insertion. No GYN concerns.  Last pap smear was on 2023 and was normal.  IUD Insertion Procedure Note Procedure: IUD insertion with Mirena UPT:  not indicated - s/p BTL and not currently sexually active GC/CT testing: Up to date  Patient identified.  Risks, benefits and alternatives of procedure were discussed including irregular bleeding, cramping, infection, malpositioning or misplacement of the IUD outside the uterus which may require further procedure such as laparoscopy. Also discussed >99% contraception efficacy, increased risk of ectopic pregnancy with failure of method.   Emphasized that this did not protect against STIs, condoms recommended during all sexual encounters. Consent signed. Time out performed.   Speculum inserted and cervix visualized, prepped with 3 swabs of betadine.   Grasped with a single tooth tenaculum. , Uterus sounded to 8 cm. , and IUD then inserted without difficulty per manufacturer's instructions and strings cut to 3 cm below cervical os and all instruments removed. Pt tolerated well with minimal pain and bleeding.   Discussed concerning signs/symptoms and to call if heavy bleeding, severe abdominal pain, or fever in the following 3 weeks. Manufacturer pamphlet/patient information given. Reviewed timing of efficacy for contraception and to use an alternative form of birth control until that time.   Harvie Bridge, MD Obstetrician & Gynecologist, [facility] for [employer], [facility]

## 2023-09-17 NOTE — Progress Notes (Addendum)
42 y.o. GYN presents for AUB follow up.  C/o bleeding x 2 weeks, changing her overnight pads every 20 minutes, cramping 8/10.  Administrations This Visit     levonorgestrel (MIRENA) 20 MCG/DAY IUD 1 each     Admin Date 09/17/2023 Action Given Dose 1 each Route Intrauterine Documented By Maretta Bees, RMA

## 2023-09-17 NOTE — Therapy (Incomplete)
OUTPATIENT PHYSICAL THERAPY THORACOLUMBAR TREATMENT   Patient Name: Jennifer Leach MRN: [id number] DOB:[birth year], 42 y.o., female Today's Date: 09/17/2023  END OF SESSION:    Past Medical History:  Diagnosis Date   Bacterial vaginitis 03/04/2017   Gonorrhea    Labial swelling 03/04/2017   MRSA (methicillin resistant staph aureus) culture positive 08/06/2014   Past Surgical History:  Procedure Laterality Date   TUBAL LIGATION N/A 02/19/2017   Procedure: POST PARTUM TUBAL LIGATION;  Surgeon: [name], MD;  Location: [facility];  Service: Gynecology;  Laterality: N/A;   WISDOM TOOTH EXTRACTION     Patient Active Problem List   Diagnosis Date Noted   Iron deficiency anemia due to chronic blood loss 08/27/2023   Routine general medical examination at a health care facility 08/27/2023   Chronic bilateral low back pain 02/05/2023   GAD (generalized anxiety disorder) 09/25/2022   Insomnia 09/25/2022   Depression, major, recurrent, moderate (HCC) 09/25/2022   Breakthrough bleeding on Depo-Provera 04/14/2020   Hidradenitis 04/14/2020   Gonorrhea 11/20/2019   DUB (dysfunctional uterine bleeding) 11/19/2018   H/O tubal ligation 03/15/2017   Bacterial vaginitis 03/04/2017   Labial swelling 03/04/2017    PCP: [place], Betty G, MD   REFERRING PROVIDER: [place], Betty G, MD   REFERRING DIAG: M54.50,G89.29 (ICD-10-CM) - Chronic bilateral low back pain without sciatica   Rationale for Evaluation and Treatment: Rehabilitation  THERAPY DIAG:  No diagnosis found.  ONSET DATE: May 2023  SUBJECTIVE:                                                                                                                                                                                           SUBJECTIVE STATEMENT: ***  PERTINENT HISTORY:  Chronic LBP  PAIN:  Are you having pain? Yes: NPRS scale: 8 today up to 10/10 Pain location: mid low back; moves up Tspine and down  to tailbone Pain description: sharp Aggravating factors: sit to stand, prolonged sitting and walking Relieving factors: heat  PRECAUTIONS: None  RED FLAGS: None   WEIGHT BEARING RESTRICTIONS: No  FALLS:  Has patient fallen in last 6 months? No  LIVING ENVIRONMENT: Lives with: lives with their family Lives in: House/apartment Stairs: Yes: Internal: 13 steps; on right going up Has following equipment at home: None  OCCUPATION: works in Recruitment consultant at [employer]; has to scrub the floor with mop  PLOF: Independent  PATIENT GOALS: Get my back together and learn some stuff to help it.  NEXT MD VISIT: none scheduled  OBJECTIVE:  Note: Objective measures were completed at Evaluation unless otherwise noted.  DIAGNOSTIC FINDINGS:  XR: Alignment is anatomic. Vertebral body and disc space heights are maintained. No pars defects. No degenerative changes.  PATIENT SURVEYS:  ODI   29 / 50 or 58 %  COGNITION: Overall cognitive status: Within functional limits for tasks assessed     SENSATION: WFL  MUSCLE LENGTH: Tight left HF, B HS R>L  POSTURE: forward head and increased lumbar lordosis  PALPATION: Left upper lumbar and lower thoracic paraspinals tight and tender UPA L L1/2, L2/3 painful  LUMBAR ROM:   AROM eval  Flexion 60% *  Extension Full *  Right lateral flexion Full  Left lateral flexion 75%* left  Right rotation full  Left rotation Full but spasms L QL   (Blank rows = not tested)  LOWER EXTREMITY ROM:   WFL  LOWER EXTREMITY MMT:    MMT Right eval Left eval  Hip flexion 5 5  Hip extension 5 4-  Hip abduction 5 5  Hip adduction 5 5  Hip internal rotation    Hip external rotation    Knee flexion 4+ 4  Knee extension 4+ 4+  Ankle dorsiflexion 5 5  Ankle plantarflexion    Ankle inversion    Ankle eversion     (Blank rows = not tested)  LUMBAR SPECIAL TESTS:  Straight leg raise test: Negative, Slump test: Positive, and Quadrant  test: Positive  Slump R pos, Quadrant L positive    TODAY'S TREATMENT:                                                                                                                              DATE:   09/18/23 ***gentle lumbar ROM, core and lumbar stab, stretch L hip flexor, B HS LTR Child's pose 3 way Prone pelvic press series  08/27/23 See pt ed and HEP  If treatment provided at initial evaluation, no treatment charged due to lack of authorization.    PATIENT EDUCATION:  Education details: PT eval findings, anticipated POC, and initial HEP  Person educated: Patient Education method: Explanation, Demonstration, and Handouts Education comprehension: verbalized understanding and returned demonstration  HOME EXERCISE PROGRAM: Access Code: [email or web address] URL: [email or web address] Date: 08/27/2023 Prepared by: Raynelle Fanning  Exercises - Supine Lower Trunk Rotation  - 2 x daily - 7 x weekly - 1 sets - 5 reps - 10 sec hold - Child's Pose Stretch  - 2 x daily - 7 x weekly - 1 sets - 2 reps - 20 sec hold - Child's Pose with Sidebending  - 2 x daily - 7 x weekly - 1 sets - 2 reps - 20 sec hold Pelvic Press     Place hands under belly between navel and pubic bone, palms up. Feel pressure on hands. Increase pressure on hands by pressing pelvis down. This is NOT a pelvic tilt. Hold __5_ seconds. Relax. Repeat _10__ times. Once a day.  KNEE: Flexion - Prone   Hold pelvic press. Bend knee, then return the foot down.  Repeat on opposite leg. Do not raise hips. _10__ reps per set. When this is mastered, pull both heels up at same time, x 10 reps.  Once a day   Leg Lift: One-Leg   Press pelvis down. Keep knee straight; lengthen and lift one leg (from waist). Do not twist body. Keep other leg down. Hold _1__ seconds. Relax. Repeat 10 time. Repeat with other leg. ASSESSMENT:  CLINICAL IMPRESSION: ***   OBJECTIVE IMPAIRMENTS: decreased activity tolerance, difficulty walking,  decreased ROM, decreased strength, increased muscle spasms, impaired flexibility, improper body mechanics, postural dysfunction, and pain.   ACTIVITY LIMITATIONS: carrying, lifting, bending, sitting, standing, sleeping, transfers, and locomotion level  PARTICIPATION LIMITATIONS: cleaning, interpersonal relationship, and occupation  PERSONAL FACTORS: Profession and Time since onset of injury/illness/exacerbation are also affecting patient's functional outcome.   REHAB POTENTIAL: Excellent  CLINICAL DECISION MAKING: Stable/uncomplicated  EVALUATION COMPLEXITY: Low   GOALS: Goals reviewed with patient? Yes  SHORT TERM GOALS: Target date: 09/18/2023   Patient will be independent with initial HEP.  Baseline: no HEP Goal status: INITIAL  2.  Decreased pain and spasms by 30% Baseline: 8 to 10/10 Goal status: INITIAL   LONG TERM GOALS: Target date: 10/09/2023   Patient will be independent with advanced/ongoing HEP to improve outcomes and carryover.  Baseline: initial HEP Goal status: INITIAL  2.  Patient will report 75% improvement in low back pain to improve QOL.  Baseline: 8 to 10/10 Goal status: INITIAL  3.  Patient will report <= 23 on ODI to demonstrate improved functional ability.  Baseline: 29 / 50 Goal status: INITIAL  4.  Patient will demonstrate improved LE strength to 5/5 to allow for correct body mechanics. Baseline: see objective chart Goal status: INITIAL  5. Patient will be able to perform sit to stand transfers with no increased pain. Baseline: 8 to 10/10 with sit to stand Goal status: INITIAL   PLAN:  PT FREQUENCY: 2x/week  PT DURATION: 6 weeks  PLANNED INTERVENTIONS: 97164- PT Re-evaluation, 97110-Therapeutic exercises, 97530- Therapeutic activity, [id number]- Neuromuscular re-education, 97535- Self Care, [id number]- Manual therapy, [id number]- Aquatic Therapy, 97014- Electrical stimulation (unattended), [id number]- Traction (mechanical), Patient/Family education,  Taping, Dry Needling, Joint mobilization, Spinal mobilization, Cryotherapy, and Moist heat.  PLAN FOR NEXT SESSION: Review and progress HEP, gentle lumbar ROM, core and lumbar stab, stretch L hip flexor, B HS   Solon Palm, PT  09/17/2023, 8:03 AM

## 2023-09-17 NOTE — Progress Notes (Signed)
   RETURN GYNECOLOGY VISIT  Subjective:  Jennifer Leach is a 42 y.o. [id number] with LMP 09/03/23 presenting for follow up of AUB and HS  AUB hx (from note 9/4): Irregular, heavy cycles x 6 years. Passes clots, changes pads q 30 min to 2 hours. Has pain with heavy bleeding. Has to sleep with a heating pad. Notes bleeding is worse after intercourse. Has tried ibuprofen, lysteda and Depo without relief. Normal pelvic US 09/2022, benign EMB 05/03/23. We started aygestin 15mg  daily.  Today, she reports bothersome side effects from aygestin. Does not like taking it every day and notes that her bleeding comes back if she misses a day or when she tried to taper down.   HS improving with doxy. Has not had derm appt  Hx notable for PP BTL. 1/2ppd - not candidate for estrogen.  Objective:   Vitals:   09/17/23 0812  BP: 119/80  Pulse: 81  Weight: 182 lb (82.6 kg)  Height: 5\' 2"  (1.575 m)   General:  Alert, oriented and cooperative. Patient is in no acute distress.  Skin: Skin is warm and dry. No rash noted.   Cardiovascular: Normal heart rate noted  Respiratory: Normal respiratory effort, no problems with respiration noted  Abdomen: Soft, non-tender, non-distended   Pelvic: NEFG. Uterus mobile, small, non tender  Exam performed in the presence of a chaperone  Assessment and Plan:  Jennifer Leach is a 42 y.o. with AUB now s/p IUD insertion  Abnormal uterine bleeding (AUB) Chronic issue now w/ side effects from treatment (aygestin) Will complete w/u for irregular cycles as outlined below Discussed options for management & pt opted for IUD insertion. S/p uncomplicated insertion, counseled to expect ongoing/irregular bleeding for 6 months after insertion. See procedure note -     TSH Rfx on Abnormal to Free T4 -     Prolactin -     FSH -     Estradiol  Encounter for IUD insertion -     levonorgestrel (MIRENA) 20 MCG/DAY IUD 1 each  Vaginal discharge Testing for BV/yeast ordered.  Requests metrogel if +BV  Return in about 6 months (around 03/17/2024) for follow up bleeding.  Future Appointments  Date Time Provider Department Center  09/18/2023  8:00 AM Riddles, Enrique Sack, PT [facility] None   Lennart Pall, MD

## 2023-09-18 ENCOUNTER — Ambulatory Visit: Payer: Medicaid Other | Admitting: Family Medicine

## 2023-09-18 ENCOUNTER — Encounter: Payer: Self-pay | Admitting: Family Medicine

## 2023-09-18 ENCOUNTER — Ambulatory Visit: Payer: Medicaid Other | Admitting: Physical Therapy

## 2023-09-18 VITALS — BP 142/88 | HR 85 | Temp 98.2°F | Wt 184.0 lb

## 2023-09-18 DIAGNOSIS — R0600 Dyspnea, unspecified: Secondary | ICD-10-CM

## 2023-09-18 DIAGNOSIS — R079 Chest pain, unspecified: Secondary | ICD-10-CM

## 2023-09-18 LAB — CERVICOVAGINAL ANCILLARY ONLY
Bacterial Vaginitis (gardnerella): NEGATIVE
Candida Glabrata: NEGATIVE
Candida Vaginitis: NEGATIVE
Comment: NEGATIVE
Comment: NEGATIVE
Comment: NEGATIVE

## 2023-09-18 LAB — PROLACTIN: Prolactin: 17.3 ng/mL (ref 4.8–33.4)

## 2023-09-18 LAB — FOLLICLE STIMULATING HORMONE: FSH: 0.5 m[IU]/mL

## 2023-09-18 LAB — ESTRADIOL: Estradiol: 8.5 pg/mL

## 2023-09-18 LAB — TSH RFX ON ABNORMAL TO FREE T4: TSH: 0.808 u[IU]/mL (ref 0.450–4.500)

## 2023-09-18 NOTE — Progress Notes (Signed)
Established Patient Office Visit  Subjective   Patient ID: Jennifer Leach, female    DOB: 01-01-1981  Age: 42 y.o. MRN: [id number]  Chief Complaint  Patient presents with   Chest Pain    Patient complains of chest pain, x103month     HPI   Zoriyah is seen today as a work in with approxi-1 month history of intermittent chest pain.  This is mostly described as sharp and left-sided chest.  Can vary from seconds duration to sometimes 5 minutes.  Not consistently related to exertion.  Can happen at rest.  Sometimes has some dizziness and shortness of breath.  No diaphoresis.  No orthopnea.  Has had multiple EKGs previously which have shown nonspecific ST-T changes.  Denies any frequent palpitations.  She had CT morphology study back in 2013 with coronary calcium score of 0 and no evidence for any plaque.  Does have history of smoking and quit 2 months ago.  No alcohol use.  No illicit drug use.  No history of diabetes.  She states she did have a brother that had some type of open heart surgery couple years ago.  May have had some type of congenital defect.  Not sure if this was a VSD or ASD.  Will try to confirm.  Past Medical History:  Diagnosis Date   Bacterial vaginitis 03/04/2017   Gonorrhea    Labial swelling 03/04/2017   MRSA (methicillin resistant staph aureus) culture positive 08/06/2014   Past Surgical History:  Procedure Laterality Date   TUBAL LIGATION N/A 02/19/2017   Procedure: POST PARTUM TUBAL LIGATION;  Surgeon: [name], MD;  Location: [facility];  Service: Gynecology;  Laterality: N/A;   WISDOM TOOTH EXTRACTION      reports that she has been smoking cigarettes. She started smoking about 20 years ago. She has a 2.3 pack-year smoking history. She has never used smokeless tobacco. She reports that she does not drink alcohol and does not use drugs. family history includes Hypertension in her mother. Allergies  Allergen Reactions   Septra Ds  [Sulfamethoxazole-Trimethoprim] Anaphylaxis, Swelling, Rash and Other (See Comments)    Reaction:  Facial swelling     Review of Systems  Constitutional:  Negative for weight loss.  Respiratory:  Negative for cough.   Cardiovascular:  Positive for chest pain. Negative for palpitations, leg swelling and PND.  Gastrointestinal:  Negative for abdominal pain.  Neurological:  Positive for dizziness. Negative for loss of consciousness.      Objective:     BP (!) 142/88   Pulse 85   Temp 98.2 F (36.8 C) (Oral)   Wt 184 lb (83.5 kg)   LMP 09/03/2023 (Exact Date)   SpO2 98%   BMI 33.65 kg/m  BP Readings from Last 3 Encounters:  09/18/23 (!) 142/88  09/17/23 119/80  08/27/23 122/80   Wt Readings from Last 3 Encounters:  09/18/23 184 lb (83.5 kg)  09/17/23 182 lb (82.6 kg)  08/27/23 180 lb (81.6 kg)      Physical Exam Vitals reviewed.  Constitutional:      General: She is not in acute distress.    Appearance: She is not ill-appearing.  Cardiovascular:     Rate and Rhythm: Normal rate and regular rhythm.     Heart sounds: No murmur heard. Pulmonary:     Effort: Pulmonary effort is normal.     Breath sounds: No decreased breath sounds, wheezing or rales.  Musculoskeletal:     Cervical back:  Neck supple.     Right lower leg: No edema.     Left lower leg: No edema.  Neurological:     Mental Status: She is alert.      No results found for any visits on 09/18/23.  Last CBC Lab Results  Component Value Date   WBC 6.0 08/27/2023   HGB 13.8 08/27/2023   HCT 41.6 08/27/2023   MCV 90.6 08/27/2023   MCH 30.4 11/02/2022   RDW 13.7 08/27/2023   PLT 298.0 08/27/2023   Last metabolic panel Lab Results  Component Value Date   GLUCOSE 91 08/27/2023   NA 138 08/27/2023   K 4.1 08/27/2023   CL 106 08/27/2023   CO2 25 08/27/2023   BUN 10 08/27/2023   CREATININE 0.88 08/27/2023   GFR 82.21 08/27/2023   CALCIUM 8.7 08/27/2023   PROT 7.3 08/27/2023   ALBUMIN 3.9  08/27/2023   LABGLOB 2.9 09/19/2022   AGRATIO 1.4 09/19/2022   BILITOT 1.0 08/27/2023   ALKPHOS 69 08/27/2023   AST 21 08/27/2023   ALT 15 08/27/2023   ANIONGAP 12 11/02/2022   Last lipids Lab Results  Component Value Date   CHOL 168 08/27/2023   HDL 30.60 (L) 08/27/2023   LDLCALC 121 (H) 08/27/2023   TRIG 79.0 08/27/2023   CHOLHDL 5 08/27/2023   Last hemoglobin A1c Lab Results  Component Value Date   HGBA1C 5.1 09/19/2022      The 10-year ASCVD risk score (Arnett DK, et al., 2019) is: 6.7%    Assessment & Plan:   Problem List Items Addressed This Visit   None Visit Diagnoses     Chest pain, unspecified type    -  Primary   Relevant Orders   EKG 12-Lead (Completed)     42 year old female with 1 month history of intermittent somewhat atypical chest pain.  Symptoms frequently occur at rest and sometimes are very fleeting but sometimes last up to 5 minutes.  EKG today shows sinus rhythm with nonspecific T wave abnormalities inferior and lateral leads.  No significant change compared to previous tracings.  -We have asked that she try to gather further details regarding her brothers heart predicament to see if she can discern more specific diagnosis -Set up echocardiogram to further assess -Follow-up immediately for any progressive chest tightness or shortness of breath or other new symptoms such as syncope -She did have CT morphology study 2013 with coronary calcium score of 0 -If above unrevealing and symptoms persist consider cardiology referral. -We also recommended close follow-up with primary to reassess blood pressure as this was up slightly today  No follow-ups on file.    Evelena Peat, MD

## 2023-09-18 NOTE — Patient Instructions (Signed)
I will be setting up echocardiogram to further assess your chest pain.

## 2023-09-20 ENCOUNTER — Encounter: Payer: Medicaid Other | Admitting: Rehabilitative and Restorative Service Providers"

## 2023-09-21 ENCOUNTER — Ambulatory Visit: Payer: Medicaid Other | Admitting: Family Medicine

## 2023-09-21 ENCOUNTER — Telehealth: Payer: Self-pay | Admitting: Family Medicine

## 2023-09-21 NOTE — Telephone Encounter (Signed)
Pt would like to TOC to Dr. Salomon Fick.   Please advise.

## 2023-09-24 ENCOUNTER — Encounter: Payer: Medicaid Other | Admitting: Rehabilitative and Restorative Service Providers"

## 2023-09-25 ENCOUNTER — Ambulatory Visit: Payer: Medicaid Other | Admitting: Family Medicine

## 2023-09-26 NOTE — Telephone Encounter (Signed)
Okay with Dr. Jordan 

## 2023-09-27 ENCOUNTER — Encounter: Payer: Medicaid Other | Admitting: Rehabilitative and Restorative Service Providers"

## 2023-09-28 NOTE — Telephone Encounter (Signed)
Unable to accept patient at this time.

## 2023-09-28 NOTE — Telephone Encounter (Signed)
Is this okay with you Dr. Salomon Fick?

## 2023-10-01 NOTE — Telephone Encounter (Signed)
Pt is aware message below and would like to transfer to dr Casimiro Needle. Please advise

## 2023-10-15 ENCOUNTER — Ambulatory Visit: Payer: Medicaid Other | Admitting: Family Medicine

## 2023-10-15 ENCOUNTER — Encounter: Payer: Self-pay | Admitting: Family Medicine

## 2023-10-15 ENCOUNTER — Ambulatory Visit: Payer: Medicaid Other

## 2023-10-15 VITALS — BP 130/85 | HR 95 | Resp 12 | Ht 62.0 in | Wt 188.0 lb

## 2023-10-15 DIAGNOSIS — R079 Chest pain, unspecified: Secondary | ICD-10-CM | POA: Diagnosis not present

## 2023-10-15 DIAGNOSIS — R519 Headache, unspecified: Secondary | ICD-10-CM | POA: Diagnosis not present

## 2023-10-15 DIAGNOSIS — K219 Gastro-esophageal reflux disease without esophagitis: Secondary | ICD-10-CM | POA: Diagnosis not present

## 2023-10-15 DIAGNOSIS — I10 Essential (primary) hypertension: Secondary | ICD-10-CM | POA: Insufficient documentation

## 2023-10-15 MED ORDER — AMLODIPINE BESYLATE 2.5 MG PO TABS: 2.5000 mg | ORAL_TABLET | Freq: Every day | ORAL | 1 refills | Status: DC

## 2023-10-15 MED ORDER — OMEPRAZOLE 40 MG PO CPDR: 40.0000 mg | DELAYED_RELEASE_CAPSULE | Freq: Every day | ORAL | 0 refills | Status: DC

## 2023-10-15 NOTE — Assessment & Plan Note (Addendum)
 We reviewed diagnosis criteria for hypertension. We discussed options, including monitoring BP at home for 2 weeks before deciding about pharmacologic treatment. She would like to try pharmacologic treatment, recommend amlodipine  2.5 mg at bedtime and low-salt/DASH diet. Recommend monitoring BP at home regularly and to let me know about BP readings in 3 to 4 weeks. Also recommend arranging an eye exam. Follow-up in 3 to 4 months with new PCP (she requested TOC to Dr. Ozell).

## 2023-10-15 NOTE — Telephone Encounter (Signed)
 Fine with me. BJ

## 2023-10-15 NOTE — Assessment & Plan Note (Signed)
 This problem could be contributing to her chest discomfort. She agrees with trial of omeprazole 40 mg 30-minute before breakfast. GERD precautions are recommended. Follow-up in 3 to 4 months.

## 2023-10-15 NOTE — Patient Instructions (Addendum)
 A few things to remember from today's visit:  Gastroesophageal reflux disease, unspecified whether esophagitis present - Plan: omeprazole  (PRILOSEC) 40 MG capsule  Essential (primary) hypertension - Plan: amLODipine  (NORVASC ) 2.5 MG tablet  Start Amlodipine  2.5 mg at bedtime. Lower salt intake. Monitor blood pressure at home and let me know about blood pressure readings in 3-4 weeks. Keep appt with cardiologist.  Omeprazole  40 mg 30 min before breakfast to help with heartburn.  If you need refills for medications you take chronically, please call your pharmacy. Do not use My Chart to request refills or for acute issues that need immediate attention. If you send a my chart message, it may take a few days to be addressed, specially if I am not in the office.  Please be sure medication list is accurate. If a new problem present, please set up appointment sooner than planned today.

## 2023-10-15 NOTE — Progress Notes (Signed)
 HPI: Jennifer Leach is a 43 y.o. female with a PMHx significant for hidradenitis, GAD, iron  def anemia, chronic bilateral low back pain, depression, and insomnia here today concerned about elevated BP.  Last seen on 08/27/2023 for CPE.  Patient has been having headaches for several months, which she believe happen when her BP is high. She describes the them as frontal pressure headaches, and rates them as 7-8/10.  She endorses sensitivity to light and occasional blurry vision with the headaches. Denies nausea or vomiting.  She sometimes takes tylenol  for the headaches.    She saw Dr. Micheal on 09/18/2023 for chest pain and her BP was 142/88.  She doesn't check her BP regularly at home, but has a family history of HTN. She says when it has been checked in the past, it is usually 120s/70s.  She says she doesn't eat much salt.  She has never had an eye exam.  Lab Results  Component Value Date   NA 138 08/27/2023   CL 106 08/27/2023   K 4.1 08/27/2023   CO2 25 08/27/2023   BUN 10 08/27/2023   CREATININE 0.88 08/27/2023   GFR 82.21 08/27/2023   CALCIUM 8.7 08/27/2023   ALBUMIN 3.9 08/27/2023   GLUCOSE 91 08/27/2023   Chest pain:  She is still having occasional chest pain which has been going on for a couple months.  She endorses associated shortness of breath, and says it feels like something is pressing down on her chest. Every once in awhile, she feels a sharp pain on the left side of her chest.  She occasionally has heartburn.  She has an appointment scheduled with cardiology.   She hasn't been smoking over the last 3 months.   LMP: She hasn't stopped bleeding. She saw her gynecologist on 09/17/2023, and had an IUD placed.   Review of Systems  Constitutional:  Positive for fatigue. Negative for activity change, appetite change, chills and fever.  HENT:  Negative for sore throat and trouble swallowing.   Respiratory:  Negative for cough and wheezing.    Gastrointestinal:  Negative for abdominal pain.  Genitourinary:  Negative for decreased urine volume, dysuria and hematuria.  Skin:  Negative for rash.  Neurological:  Negative for syncope and facial asymmetry.  Psychiatric/Behavioral:  Positive for sleep disturbance. Negative for confusion and hallucinations.   See other pertinent positives and negatives in HPI.  Current Outpatient Medications on File Prior to Visit  Medication Sig Dispense Refill   Iron , Ferrous Sulfate , 325 (65 Fe) MG TABS Take 325 mg by mouth every other day. 30 tablet 3   No current facility-administered medications on file prior to visit.    Past Medical History:  Diagnosis Date   Bacterial vaginitis 03/04/2017   Gonorrhea    Labial swelling 03/04/2017   MRSA (methicillin resistant staph aureus) culture positive 08/06/2014   Allergies  Allergen Reactions   Septra Ds [Sulfamethoxazole -Trimethoprim] Anaphylaxis, Swelling, Rash and Other (See Comments)    Reaction:  Facial swelling    Social History   Socioeconomic History   Marital status: Single    Spouse name: Not on file   Number of children: Not on file   Years of education: Not on file   Highest education level: Not on file  Occupational History   Not on file  Tobacco Use   Smoking status: Every Day    Current packs/day: 0.00    Average packs/day: 0.3 packs/day for 9.0 years (2.3 ttl pk-yrs)  Types: Cigarettes    Start date: 06/10/2003    Last attempt to quit: 06/09/2012    Years since quitting: 11.3   Smokeless tobacco: Never   Tobacco comments:    pt reports is is smoking   Vaping Use   Vaping status: Never Used  Substance and Sexual Activity   Alcohol use: No   Drug use: No   Sexual activity: Yes    Partners: Male    Birth control/protection: Surgical  Other Topics Concern   Not on file  Social History Narrative   Not on file   Social Drivers of Health   Financial Resource Strain: Not on File (02/01/2022)   Received from [facility],  [employer]    Financial Resource Strain: 0  Food Insecurity: Not on File (02/01/2022)   Received from [place], [place]   Food Insecurity    Food: 0  Transportation Needs: Not on File (02/01/2022)   Received from [facility] [employer], [employer] Needs    Transportation: 0  Physical Activity: Not on File (02/01/2022)   Received from [facility], [place]   Physical Activity    Physical Activity: 0  Stress: Not on File (02/01/2022)   Received from [facility], [place]   Stress    Stress: 0  Social Connections: Not on File (02/01/2022)   Received from [facility], [place]   Social Connections    Social Connections and Isolation: 0    Vitals:   10/15/23 1057 10/15/23 1115  BP: 130/80 130/85  Pulse: 95   Resp: 12   SpO2: 98%    Body mass index is 34.39 kg/m.  Physical Exam Vitals and nursing note reviewed.  Constitutional:      General: She is not in acute distress.    Appearance: She is well-developed.  HENT:     Head: Normocephalic and atraumatic.     Mouth/Throat:     Mouth: Mucous membranes are moist.     Pharynx: Oropharynx is clear.  Eyes:     Conjunctiva/sclera: Conjunctivae normal.  Cardiovascular:     Rate and Rhythm: Normal rate and regular rhythm.     Pulses:          Posterior tibial pulses are 2+ on the right side and 2+ on the left side.     Heart sounds: No murmur heard. Pulmonary:     Effort: Pulmonary effort is normal. No respiratory distress.     Breath sounds: Normal breath sounds.  Abdominal:     Palpations: Abdomen is soft. There is no mass.     Tenderness: There is no abdominal tenderness.  Musculoskeletal:     Right lower leg: No edema.     Left lower leg: No edema.  Lymphadenopathy:     Cervical: No cervical adenopathy.  Skin:    General: Skin is warm.     Findings: No erythema or rash.  Neurological:     General: No focal deficit present.     Mental Status: She is alert and oriented to person, place, and time.     Cranial Nerves: No cranial  nerve deficit.     Gait: Gait normal.  Psychiatric:        Mood and Affect: Mood and affect normal.    ASSESSMENT AND PLAN:  Ms. Strathman was seen today for blood pressure follow up.   Gastroesophageal reflux disease, unspecified whether esophagitis present Assessment & Plan: This problem could be contributing to her chest discomfort. She agrees with trial of omeprazole   40 mg 30-minute before breakfast. GERD precautions are recommended. Follow-up in 3 to 4 months.  Orders: -     Omeprazole ; Take 1 capsule (40 mg total) by mouth daily.  Dispense: 60 capsule; Refill: 0  Essential (primary) hypertension Assessment & Plan: We reviewed diagnosis criteria for hypertension. We discussed options, including monitoring BP at home for 2 weeks before deciding about pharmacologic treatment. She would like to try pharmacologic treatment, recommend amlodipine  2.5 mg at bedtime and low-salt/DASH diet. Recommend monitoring BP at home regularly and to let me know about BP readings in 3 to 4 weeks. Also recommend arranging an eye exam. Follow-up in 3 to 4 months with new PCP (she requested TOC to Dr. Ozell).  Orders: -     amLODIPine  Besylate; Take 1 tablet (2.5 mg total) by mouth at bedtime.  Dispense: 30 tablet; Refill: 1  Frontal headache We discussed possible etiologies, including vision changes that require eye glasses. Allergies and tension headache also to be consider. I do not think imaging is needed at this time. Instructed about warning signs.  Chest pain, unspecified type We discussed possible etiologies. Based on CV risk factors, the probability of this pain being caused by a serious cardiac etiology is low. She has an appointment with cardiologist, recommend keeping appointment. She was clearly instructed about warning signs.  Return in about 13 weeks (around 01/14/2024) for HTN with new PCP (Dr Ozell).SABRA LILLETTE Leonce JINNY Bridget, acting as a scribe for Dejah Droessler, MD., have  documented all relevant documentation on the behalf of Moneka Mcquinn, MD, as directed by  Trust Leh, MD while in the presence of Celester Morgan, MD.   I, Monet North, MD, have reviewed all documentation for this visit. The documentation on 10/15/23 for the exam, diagnosis, procedures, and orders are all accurate and complete.  Sharmane Dame G. Kendricks Reap, MD  [facility]. [place] office.

## 2023-10-17 NOTE — Telephone Encounter (Signed)
 Ok with me

## 2023-10-17 NOTE — Telephone Encounter (Signed)
 Pt has been scheduled.

## 2023-10-22 ENCOUNTER — Ambulatory Visit (HOSPITAL_COMMUNITY): Payer: Medicaid Other | Attending: Family Medicine

## 2023-10-23 ENCOUNTER — Encounter (HOSPITAL_COMMUNITY): Payer: Self-pay | Admitting: Family Medicine

## 2023-11-08 ENCOUNTER — Telehealth: Payer: Self-pay

## 2023-11-08 NOTE — Telephone Encounter (Signed)
Returned call, pt stated that she got Mirena in December for AUB. Pt states that she continues to have some bleeding and took 2 doses of Megace that she previously had, wants to know should she continue. Routed to provider.

## 2023-11-12 ENCOUNTER — Telehealth: Payer: Self-pay

## 2023-11-12 NOTE — Telephone Encounter (Signed)
Returned call and advised that provider stated to stop megace and irregular bleeding may occur for up to 6 months after IUD placement, pt voiced understanding.

## 2023-11-19 ENCOUNTER — Other Ambulatory Visit: Payer: Self-pay

## 2023-11-19 DIAGNOSIS — I10 Essential (primary) hypertension: Secondary | ICD-10-CM

## 2023-11-19 MED ORDER — AMLODIPINE BESYLATE 2.5 MG PO TABS: 2.5000 mg | ORAL_TABLET | Freq: Every day | ORAL | 0 refills | Status: AC

## 2023-11-20 ENCOUNTER — Ambulatory Visit: Payer: Medicaid Other | Admitting: Psychology

## 2023-11-26 ENCOUNTER — Other Ambulatory Visit (HOSPITAL_COMMUNITY)
Admission: RE | Admit: 2023-11-26 | Discharge: 2023-11-26 | Disposition: A | Discharge status: Home / Self Care | Payer: Medicaid Other | Source: Ambulatory Visit | Attending: Obstetrics and Gynecology | Admitting: Obstetrics and Gynecology

## 2023-11-26 ENCOUNTER — Ambulatory Visit (INDEPENDENT_AMBULATORY_CARE_PROVIDER_SITE_OTHER): Payer: Medicaid Other

## 2023-11-26 VITALS — BP 127/86 | HR 76

## 2023-11-26 DIAGNOSIS — Z113 Encounter for screening for infections with a predominantly sexual mode of transmission: Secondary | ICD-10-CM | POA: Diagnosis not present

## 2023-11-26 DIAGNOSIS — N898 Other specified noninflammatory disorders of vagina: Secondary | ICD-10-CM | POA: Diagnosis not present

## 2023-11-26 MED ORDER — FLUCONAZOLE 150 MG PO TABS: 150.0000 mg | ORAL_TABLET | Freq: Once | ORAL | 0 refills | Status: AC

## 2023-11-26 MED ORDER — METRONIDAZOLE 0.75 % VA GEL: 1.0000 | Freq: Every day | VAGINAL | 1 refills | Status: DC

## 2023-11-26 NOTE — Progress Notes (Signed)
SUBJECTIVE:  43 y.o. female complains of white vaginal discharge for 3 day(s). Denies abnormal vaginal bleeding or significant pelvic pain or fever. No UTI symptoms. Denies history of known exposure to STD.  No LMP recorded. (Menstrual status: Irregular Periods).  OBJECTIVE:  She appears well, afebrile. Urine dipstick: not done.  ASSESSMENT:  Vaginal Discharge  Vaginal itching/pain   PLAN:  GC, chlamydia, trichomonas, BVAG, CVAG probe sent to lab. Treatment: To be determined once lab results are received ROV prn if symptoms persist or worsen.

## 2023-11-27 LAB — CERVICOVAGINAL ANCILLARY ONLY
Bacterial Vaginitis (gardnerella): POSITIVE — AB
Candida Glabrata: NEGATIVE
Candida Vaginitis: POSITIVE — AB
Chlamydia: NEGATIVE
Comment: NEGATIVE
Comment: NEGATIVE
Comment: NEGATIVE
Comment: NEGATIVE
Comment: NEGATIVE
Comment: NORMAL
Neisseria Gonorrhea: NEGATIVE
Trichomonas: NEGATIVE

## 2023-12-04 ENCOUNTER — Ambulatory Visit: Payer: Medicaid Other | Admitting: Psychology

## 2023-12-06 ENCOUNTER — Ambulatory Visit: Payer: Medicaid Other | Admitting: Psychology

## 2023-12-06 DIAGNOSIS — F411 Generalized anxiety disorder: Secondary | ICD-10-CM | POA: Diagnosis not present

## 2023-12-06 DIAGNOSIS — F331 Major depressive disorder, recurrent, moderate: Secondary | ICD-10-CM | POA: Diagnosis not present
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
## 2023-12-10 ENCOUNTER — Ambulatory Visit: Payer: Medicaid Other | Admitting: Psychology

## 2023-12-10 DIAGNOSIS — F331 Major depressive disorder, recurrent, moderate: Secondary | ICD-10-CM

## 2023-12-10 DIAGNOSIS — F411 Generalized anxiety disorder: Secondary | ICD-10-CM | POA: Diagnosis not present
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
## 2023-12-18 ENCOUNTER — Encounter: Payer: Medicaid Other | Admitting: Family Medicine

## 2024-01-02 ENCOUNTER — Ambulatory Visit (INDEPENDENT_AMBULATORY_CARE_PROVIDER_SITE_OTHER): Admitting: Psychology

## 2024-01-02 DIAGNOSIS — F331 Major depressive disorder, recurrent, moderate: Secondary | ICD-10-CM

## 2024-01-02 DIAGNOSIS — F411 Generalized anxiety disorder: Secondary | ICD-10-CM | POA: Diagnosis not present

## 2024-01-02 NOTE — Progress Notes (Signed)
 [facility] Counselor/Therapist Progress Note  Patient ID: Jennifer Leach, MRN: [id number]    Date: 01/02/24  Time Spent: 12:00 pm - 12 54: pm  : 54 minutes  Treatment Type: Individual Therapy.  Reported Symptoms: "I have racing thoughts, my heart speeds up, my significant relationship ended after a long time, and I get anxiety sometimes." Not being able to stop worrying, trouble relaxing, little interest in doing tings, feeling tired and having little energy, trouble concentrating on anything.     Mental Status Exam: Appearance:  Well Groomed     Behavior: Appropriate  Motor: Normal  Speech/Language:  Normal Rate  Affect: Appropriate  Mood: normal  Thought process: normal  Thought content:   WNL  Sensory/Perceptual disturbances:   WNL  Orientation: oriented to person, place, and time/date  Attention: Good  Concentration: Good  Memory: WNL  Fund of knowledge:  Good  Insight:   Good  Judgment:  Good  Impulse Control: Good   Risk Assessment: Danger to Self:  No Self-injurious Behavior: No Danger to Others: No Duty to Warn:no Physical Aggression / Violence:No  Access to Firearms a concern: No  Gang Involvement:No   Subjective:   Jennifer Leach participated from office, via video, and consented to treatment. I discussed the limitations of evaluation and management by telemedicine and the availability of in person appointments. The patient expressed understanding and agreed to proceed.  Therapist participated from home office.  Jennifer Leach reviewed the events of the past week.   Patient had the meeting for her GED earlier this month and patient found it confusing. [name] called her on Sunday, they've been broke up for 2 years, and he's with someone else, and patient states she's fine with that. He wanted patient's help to find a job. 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 Patient realized that [name] only contacts  her when he needs something. Patient feels as though her anxiety comes from dealing with [name].   Interventions: Cognitive Behavioral Therapy  Diagnosis:  Depression 33.1 and Generalized Anxiety Disorder 41.1   Psychiatric Treatment: Yes , via PCP  Treatment Plan:  Client Abilities/Strengths Jennifer Leach is motivated for change to better her life and the life of her family, patient expressed willingness to do what it takes to get her life in order, patient is interested in doing things differently in her life going forward.   Support System: Mother, Jennifer Leach, Jennifer Leach   Client Treatment Preferences OPT  Client Statement of Needs Jennifer Leach would like to increase her motivation, set goals, increase healthier social interactions, learn more about her symptoms so she can manage them more effectively, and engage in consistent self-care    Treatment Level Weekly/Biweekly  Symptoms  Anxiety: Not being able to stop worrying, trouble relaxing (Status: maintained) Depression: lhaving little interest in doing things, feeling tired and having little energy, trouble concentrating on anything (Status: maintained)  Goals:   Jennifer Leach experiences symptoms of anxiety and depression.   Target Date: 12/05/24 Frequency: Weekly/Biweekly  Progress: 0 Modality: individual    Therapist will provide referrals for additional resources as appropriate.  Therapist will provide psycho-education regarding Jennifer Leach's diagnosis and corresponding treatment approaches and interventions. Jennifer Leach will support the patient's ability to achieve the goals identified. will employ CBT, BA, Problem-solving, Solution Focused, Mindfulness,  coping skills, & other evidenced-based practices will be used to promote progress towards healthy functioning to help manage decrease symptoms associated with their diagnosis.  Reduce overall level, frequency, and intensity of the feelings of depression, anxiety and panic evidenced  by decreased overall symptoms from 6 to 7 days/week to 0 to 1 days/week per client report for at least 3 consecutive months. Verbally express understanding of the relationship between feelings of depression and anxiety and their impact on thinking patterns and behaviors. Verbalize an understanding of the role that distorted thinking plays in creating fears, excessive worry, and ruminations.  Jennifer Leach participated in the creation of the treatment plan)     Jennifer Leach

## 2024-01-03 ENCOUNTER — Other Ambulatory Visit (HOSPITAL_COMMUNITY)
Admission: RE | Admit: 2024-01-03 | Discharge: 2024-01-03 | Disposition: A | Discharge status: Home / Self Care | Source: Ambulatory Visit | Attending: Obstetrics and Gynecology | Admitting: Obstetrics and Gynecology

## 2024-01-03 ENCOUNTER — Ambulatory Visit (INDEPENDENT_AMBULATORY_CARE_PROVIDER_SITE_OTHER)

## 2024-01-03 VITALS — BP 124/83 | HR 67

## 2024-01-03 DIAGNOSIS — N898 Other specified noninflammatory disorders of vagina: Secondary | ICD-10-CM

## 2024-01-03 LAB — CERVICOVAGINAL ANCILLARY ONLY
Bacterial Vaginitis (gardnerella): POSITIVE — AB
Candida Glabrata: NEGATIVE
Candida Vaginitis: NEGATIVE
Chlamydia: NEGATIVE
Comment: NEGATIVE
Comment: NEGATIVE
Comment: NEGATIVE
Comment: NEGATIVE
Comment: NEGATIVE
Comment: NORMAL
Neisseria Gonorrhea: NEGATIVE
Trichomonas: NEGATIVE

## 2024-01-03 MED ORDER — FLUCONAZOLE 150 MG PO TABS: 150.0000 mg | ORAL_TABLET | Freq: Once | ORAL | 0 refills | Status: AC

## 2024-01-03 NOTE — Progress Notes (Signed)
..  SUBJECTIVE:  43 y.o. female complains of vaginal irritation, itching, and odor for a few days. Pt states that she is still having irregular bleeding after IUD insertion so she is unsure if she is having discharge today.  Denies abnormal vaginal bleeding or significant pelvic pain or fever. No UTI symptoms. Denies history of known exposure to STD.  No LMP recorded. (Menstrual status: Irregular Periods).  OBJECTIVE:  She appears well, afebrile. Urine dipstick: not done.  ASSESSMENT:  Vaginal Irritation/itching Vaginal Odor   PLAN:  GC, chlamydia, trichomonas, BVAG, CVAG probe sent to lab. Treatment: To be determined once lab results are received ROV prn if symptoms persist or worsen. Pt states that she has a refill of Metrogel at the pharmacy that she will take, diflucan sent per protocol.

## 2024-01-04 ENCOUNTER — Other Ambulatory Visit: Payer: Self-pay

## 2024-01-04 ENCOUNTER — Emergency Department (HOSPITAL_COMMUNITY)

## 2024-01-04 ENCOUNTER — Emergency Department (HOSPITAL_COMMUNITY)
Admission: EM | Admit: 2024-01-04 | Discharge: 2024-01-04 | Disposition: A | Discharge status: Home / Self Care | Attending: Emergency Medicine | Admitting: Emergency Medicine

## 2024-01-04 ENCOUNTER — Encounter (HOSPITAL_COMMUNITY): Payer: Self-pay | Admitting: *Deleted

## 2024-01-04 ENCOUNTER — Ambulatory Visit: Payer: Self-pay

## 2024-01-04 DIAGNOSIS — R42 Dizziness and giddiness: Secondary | ICD-10-CM | POA: Insufficient documentation

## 2024-01-04 DIAGNOSIS — R079 Chest pain, unspecified: Secondary | ICD-10-CM

## 2024-01-04 DIAGNOSIS — R072 Precordial pain: Secondary | ICD-10-CM | POA: Insufficient documentation

## 2024-01-04 LAB — BASIC METABOLIC PANEL WITH GFR
Anion gap: 7 (ref 5–15)
BUN: 9 mg/dL (ref 6–20)
CO2: 23 mmol/L (ref 22–32)
Calcium: 8.7 mg/dL — ABNORMAL LOW (ref 8.9–10.3)
Chloride: 110 mmol/L (ref 98–111)
Creatinine, Ser: 0.83 mg/dL (ref 0.44–1.00)
GFR, Estimated: >60 mL/min (ref >60)
Glucose, Bld: 98 mg/dL (ref 70–99)
Potassium: 4 mmol/L (ref 3.5–5.1)
Sodium: 140 mmol/L (ref 135–145)

## 2024-01-04 LAB — CBC
HCT: 39.5 % (ref 36.0–46.0)
Hemoglobin: 13.5 g/dL (ref 12.0–15.0)
MCH: 31.3 pg (ref 26.0–34.0)
MCHC: 34.2 g/dL (ref 30.0–36.0)
MCV: 91.6 fL (ref 80.0–100.0)
Platelets: 240 10*3/uL (ref 150–400)
RBC: 4.31 MIL/uL (ref 3.87–5.11)
RDW: 12.9 % (ref 11.5–15.5)
WBC: 6.9 10*3/uL (ref 4.0–10.5)
nRBC: 0 % (ref 0.0–0.2)

## 2024-01-04 LAB — TROPONIN I (HIGH SENSITIVITY): Troponin I (High Sensitivity): <2 ng/L (ref <18)

## 2024-01-04 LAB — HCG, SERUM, QUALITATIVE: Preg, Serum: NEGATIVE

## 2024-01-04 MED ORDER — METRONIDAZOLE 0.75 % VA GEL: 1.0000 | Freq: Every day | VAGINAL | 1 refills | Status: DC

## 2024-01-04 NOTE — Telephone Encounter (Signed)
 Noted.

## 2024-01-04 NOTE — ED Triage Notes (Signed)
 The pt is c/o chest pain for one month  she has had dizziness  for one week  lmp this month

## 2024-01-04 NOTE — ED Provider Notes (Signed)
 Jennifer Leach Provider Note   CSN: [id number] Arrival date & time: 01/04/24  1455     History Chief Complaint  Patient presents with   Chest Pain    HPI Jennifer Leach is a 43 y.o. female presenting for chest pains and dizziness. States that she has been having an entire month of chest pains. Substernal and severe in nature. Episodes last a few moments before resolving Worsened episode today at 1300  D: FCNVSSob   Patient's recorded medical, surgical, social, medication list and allergies were reviewed in the Snapshot window as part of the initial history.   Review of Systems   Review of Systems  Constitutional:  Negative for chills and fever.  HENT:  Negative for ear pain and sore throat.   Eyes:  Negative for pain and visual disturbance.  Respiratory:  Positive for chest tightness. Negative for cough and shortness of breath.   Cardiovascular:  Positive for chest pain. Negative for palpitations.  Gastrointestinal:  Negative for abdominal pain and vomiting.  Genitourinary:  Negative for dysuria and hematuria.  Musculoskeletal:  Negative for arthralgias and back pain.  Skin:  Negative for color change and rash.  Neurological:  Negative for seizures and syncope.  All other systems reviewed and are negative.   Physical Exam Updated Vital Signs BP (!) 140/95   Pulse 78   Temp 98.5 F (36.9 C) (Oral)   Resp 18   Ht 5\' 7"  (1.702 m)   Wt 81.6 kg   LMP 12/18/2023   SpO2 96%   BMI 28.19 kg/m  Physical Exam Vitals and nursing note reviewed.  Constitutional:      General: She is not in acute distress.    Appearance: She is well-developed.  HENT:     Head: Normocephalic and atraumatic.  Eyes:     Conjunctiva/sclera: Conjunctivae normal.  Cardiovascular:     Rate and Rhythm: Normal rate and regular rhythm.     Heart sounds: No murmur heard. Pulmonary:     Effort: Pulmonary effort is normal. No respiratory distress.      Breath sounds: Normal breath sounds.  Abdominal:     Palpations: Abdomen is soft.     Tenderness: There is no abdominal tenderness.  Musculoskeletal:        General: No swelling.     Cervical back: Neck supple.  Skin:    General: Skin is warm and dry.     Capillary Refill: Capillary refill takes less than 2 seconds.  Neurological:     Mental Status: She is alert.  Psychiatric:        Mood and Affect: Mood normal.      ED Course/ Medical Decision Making/ A&P    Procedures Procedures   Medications Ordered in ED Medications - No data to display Medical Decision Making: Jennifer Leach is a 44 y.o. female who presented to the ED today with chest pain, detailed above.  Based on patient's comorbidities, patient has a heart score of 2.    Patient placed on continuous vitals and telemetry monitoring while in ED which was reviewed periodically.  Complete initial physical exam performed, notably the patient was HDS in NAD.   Reviewed and confirmed nursing documentation for past medical history, family history, social history.    Initial Assessment: With the patient's presentation of left-sided chest pain, most likely diagnosis is musculoskeletal chest pain versus GERD, although ACS remains on the differential. Other diagnoses were considered including (but not  limited to) pulmonary embolism, community-acquired pneumonia, aortic dissection, pneumothorax, underlying bony abnormality, anemia. These are considered less likely due to history of present illness and physical exam findings.    In particular, concerning pulmonary embolism: Patient is PERC NEGATIVE and the they deny malignancy, recent surgery, history of DVT, or calf tenderness leading to a low risk Wells score. Aortic Dissection also reconsidered but seems less likely based on the location, quality, onset, and severity of symptoms in this case. Patient has a lack of serious comorbidities for this condition including a lack of  Smoking. Patient also has a lack of underlying history of AD or TAA.  This is most consistent with an acute life/limb threatening illness complicated by underlying chronic conditions.   Initial Plan: Evaluate for ACS with single troponin and EKG evaluated as below  Evaluate for dissection, bony abnormality, or pneumonia with chest x-ray and screening laboratory evaluation including CBC, BMP  Further evaluation for pulmonary embolism not indicated at this time based on patient's PERC and Wells score.  Further evaluation for Thoracic Aortic Dissection not indicated at this time based on patient's clinical history and PE findings.   Initial Study Results: EKG was reviewed independently. Rate, rhythm, axis, intervals all examined and without medically relevant abnormality. ST segments without concerns for elevations.    Laboratory  Single troponin demonstrated NAA   CBC and BMP without obvious metabolic or inflammatory abnormalities requiring further evaluation   Radiology  DG Chest 2 View Result Date: 01/04/2024 CLINICAL DATA:  Chest pain for 1 month. EXAM: CHEST - 2 VIEW COMPARISON:  Chest radiograph dated 11/02/2022. FINDINGS: The heart size and mediastinal contours are within normal limits. No focal consolidation, pleural effusion, or pneumothorax. No acute osseous abnormality. IMPRESSION: No acute cardiopulmonary findings. Electronically Signed   By: Hart Robinsons M.D.   On: 01/04/2024 16:10    Final Assessment and Plan: On repeat clinical assessment, patient has had complete symptomatic resolution after administration.  They are currently ambulatory tolerating p.o. intake.  They deny fevers or chills, nausea vomiting, syncope shortness of breath.  Favor likely musculoskeletal versus gastroesophageal etiology of patient's symptoms.  Considered ACS grossly less likely given resolution, well appearance and negative troponin and low HEART score.  Patient to follow-up with primary care  provider within 48 hours for ongoing care and management.    Clinical Impression:  1. Nonspecific chest pain      Data Unavailable   Final Clinical Impression(s) / ED Diagnoses Final diagnoses:  Nonspecific chest pain    Rx / DC Orders ED Discharge Orders     None         Glyn Ade, MD 01/04/24 1625

## 2024-01-04 NOTE — Progress Notes (Signed)
Metrogel sent for BV.

## 2024-01-04 NOTE — Telephone Encounter (Signed)
 Patient is at the ED

## 2024-01-04 NOTE — Telephone Encounter (Signed)
  Chief Complaint: mid chest and left chest above breast heaviness "feels like someone is sitting on my chest" Symptoms: dizziness started last week , feels like "heartburn" Frequency: has h/o chest pain and has been worked up many times- last week  Pertinent Negatives: Patient denies N/V sweating, radiating  Disposition: [x] ED /[] Urgent Care (no appt availability in office) / [] Appointment(In office/virtual)/ []  [facility]/ [] Home Care/ [] Refused Recommended Disposition /[] [facility]/ []  Follow-up with PCP Additional Notes: Pt hesitant to go to ED- stated she will go after long discussion about heart and dizziness and sx. Reason for Disposition  [1] Chest pain (or "angina") comes and goes AND [2] is happening more often (increasing in frequency) or getting worse (increasing in severity)  (Exception: Chest pains that last only a few seconds.)    Describes sensation as a heaviness  Answer Assessment - Initial Assessment Questions 1. LOCATION: "Where does it hurt?"       Middle chest and above breast  2. RADIATION: "Does the pain go anywhere else?" (e.g., into neck, jaw, arms, back)     no 3. ONSET: "When did the chest pain begin?" (Minutes, hours or days)      2 weeks ago but has a chronic h/o CP 4. PATTERN: "Does the pain come and go, or has it been constant since it started?"  "Does it get worse with exertion?"      Comes and goes   6. SEVERITY: "How bad is the pain?"  (e.g., Scale 1-10; mild, moderate, or severe)    - MILD (1-3): doesn't interfere with normal activities     - MODERATE (4-7): interferes with normal activities or awakens from sleep    - SEVERE (8-10): excruciating pain, unable to do any normal activities       Heaviness- could not quantify  9. CAUSE: "What do you think is causing the chest pain?"     ? Heartburn  10. OTHER SYMPTOMS: "Do you have any other symptoms?" (e.g., dizziness, nausea, vomiting, sweating, fever, difficulty breathing,  cough)       Sitting on chest, having to take extra breaths- dizziness and vertigo past week  Protocols used: Chest Pain-A-AH

## 2024-01-09 ENCOUNTER — Ambulatory Visit (INDEPENDENT_AMBULATORY_CARE_PROVIDER_SITE_OTHER): Admitting: Psychology

## 2024-01-09 DIAGNOSIS — F411 Generalized anxiety disorder: Secondary | ICD-10-CM | POA: Diagnosis not present

## 2024-01-09 DIAGNOSIS — F331 Major depressive disorder, recurrent, moderate: Secondary | ICD-10-CM | POA: Diagnosis not present

## 2024-01-09 NOTE — Progress Notes (Signed)
 Jennifer Leach, MRN: [id number]    Date: 01/09/24  Time Spent: 10:00 am - 10:54 am : 54  minutes    Treatment Type: Individual Therapy.  Reported Symptoms:  "I have racing thoughts, my heart speeds up, my significant relationship ended after a long time, and I get anxiety sometimes." Not being able to stop worrying, trouble relaxing, little interest in doing tings, feeling tired and having little energy, trouble concentrating on anything.   Mental Status Exam: Appearance:  Well Groomed     Behavior: Appropriate  Motor: Normal  Speech/Language:  Normal Rate  Affect: Appropriate  Mood: normal  Thought process: normal  Thought content:   WNL  Sensory/Perceptual disturbances:   WNL  Orientation: oriented to person, place, and time/date  Attention: Good  Concentration: Good  Memory: WNL  Fund of knowledge:  Good  Insight:   Good  Judgment:  Good  Impulse Control: Good   Risk Assessment: Danger to Self:  No Self-injurious Behavior: No Danger to Others: No Duty to Warn:no Physical Aggression / Violence:No  Access to Firearms a concern: No  Gang Involvement:No   Subjective:   [name] participated from car, via video, and consented to treatment. I discussed the limitations of evaluation and management by telemedicine and the availability of in person appointments. The patient expressed understanding and agreed to proceed.  Therapist participated from home office.  Samoria reviewed the events of the past week.   Patient reported that it was a typical week. She also reported how she had blocked [name] and then she took an additional step and deleted his phone message. Patient also noted that she has done that in the past, but she didn't keep him blocked and she didn't keep him deleted. Patient was hoping to be able to continue that behavior toward [name]. (MooMoo=cold hearted) now asked this Clinical research associate to  please refer to him as [name].  Will is someone who she started to talk to last year, he is someone who works weekends where patient works. He is the only coworker that patient talks to outside of work. Another friend is Trinidad Curet tells patient that she, patient, reminds her of her mother. Discussion then centered around the importance of relationships, and when to expend the energy, and when to conserve her energy in relationships.   Interventions: Cognitive Behavioral Therapy  Diagnosis:  Depression 33.1 and Generalized Anxiety Disorder 41.1    Psychiatric Treatment: Yes , via PCP  Treatment Plan:  Client Abilities/Strengths Raeghan is motivated for change to better her life and the life of her family, patient expressed willingness to do what it takes to get her life in order, patient is interested in doing things differently in her life going forward.    Support System: Mother, Clair Gulling, Erica-friend   Client Treatment Preferences OPT  Client Statement of Needs Lyanne would like to increase her motivation, set goals, increase healthier social interactions, learn more about her symptoms so she can manage them more effectively, and engage in consistent self-care.       Treatment Level Weekly/Biweekly  Symptoms  Anxiety: Not being able to stop worrying, trouble relaxing (Status: maintained) Depression: lhaving little interest in doing things, feeling tired and having little energy, trouble concentrating on anything (Status: maintained)   Goals:   Nicolet experiences symptoms of anxiety and depression.   Target Date: 12/05/24 Frequency: Weekly/Biweekly  Progress: 0 Modality: individual    Therapist will provide  referrals for additional resources as appropriate.  Therapist will provide psycho-education regarding Anaalicia's diagnosis and corresponding treatment approaches and interventions. [name] will support the patient's ability to achieve the goals identified.  will employ CBT, BA, Problem-solving, Solution Focused, Mindfulness,  coping skills, & other evidenced-based practices will be used to promote progress towards healthy functioning to help manage decrease symptoms associated with their diagnosis.   Reduce overall level, frequency, and intensity of the feelings of depression, anxiety and panic evidenced by decreased overall symptoms from 6 to 7 days/week to 0 to 1 days/week per client report for at least 3 consecutive months. Verbally express understanding of the relationship between feelings of depression and anxiety and their impact on thinking patterns and behaviors. Verbalize an understanding of the role that distorted thinking plays in creating fears, excessive worry, and ruminations.  [name] participated in the creation of the treatment plan)    [name]

## 2024-01-14 ENCOUNTER — Telehealth: Payer: Self-pay

## 2024-01-14 NOTE — Telephone Encounter (Signed)
 Return TC to pt regarding bleeding. Pt had IUD placed in December with Dr. Berton Lan and has f/u in June. Pt reports still bleeding, only goes maybe 2-3 days without. Cycle starts heavy then goes light, then spots until next period. Pt states it really messes with her personal life. Informed pt about message sent to Dr. Berton Lan with recommendations, will f/u with pt.

## 2024-01-16 ENCOUNTER — Encounter: Admitting: Psychology

## 2024-01-16 ENCOUNTER — Other Ambulatory Visit: Payer: Self-pay

## 2024-01-16 DIAGNOSIS — Z30431 Encounter for routine checking of intrauterine contraceptive device: Secondary | ICD-10-CM

## 2024-01-16 NOTE — Progress Notes (Signed)
 This encounter was created in error - please disregard.

## 2024-01-16 NOTE — Progress Notes (Signed)
 Pelvic US placed per Dr. Berton Lan to check IUD placement.

## 2024-01-21 ENCOUNTER — Ambulatory Visit (HOSPITAL_BASED_OUTPATIENT_CLINIC_OR_DEPARTMENT_OTHER)
Admission: RE | Admit: 2024-01-21 | Discharge: 2024-01-21 | Disposition: A | Discharge status: Home / Self Care | Source: Ambulatory Visit | Attending: Obstetrics and Gynecology | Admitting: Obstetrics and Gynecology

## 2024-01-21 DIAGNOSIS — Z30431 Encounter for routine checking of intrauterine contraceptive device: Secondary | ICD-10-CM | POA: Insufficient documentation

## 2024-01-22 ENCOUNTER — Other Ambulatory Visit (HOSPITAL_COMMUNITY)
Admission: RE | Admit: 2024-01-22 | Discharge: 2024-01-22 | Disposition: A | Discharge status: Home / Self Care | Source: Ambulatory Visit | Attending: Obstetrics and Gynecology | Admitting: Obstetrics and Gynecology

## 2024-01-22 ENCOUNTER — Ambulatory Visit

## 2024-01-22 VITALS — BP 134/86 | HR 78

## 2024-01-22 DIAGNOSIS — N898 Other specified noninflammatory disorders of vagina: Secondary | ICD-10-CM | POA: Diagnosis present

## 2024-01-22 LAB — POCT URINALYSIS DIPSTICK
Bilirubin, UA: NEGATIVE
Glucose, UA: NEGATIVE
Leukocytes, UA: NEGATIVE
Nitrite, UA: NEGATIVE
Protein, UA: NEGATIVE
Spec Grav, UA: 1.01 (ref 1.010–1.025)
Urobilinogen, UA: 2 U/dL — AB
pH, UA: 6 (ref 5.0–8.0)

## 2024-01-22 NOTE — Progress Notes (Signed)
 SUBJECTIVE:  43 y.o. female complains of vaginal irritation for a week and lower abdomen pain that radiates to lower back. Pt had US  performed yesterday to check IUD placement, results not back yet. Denies abnormal vaginal bleeding or significant pelvic pain or fever. Denies history of known exposure to STD.  Patient's last menstrual period was 12/18/2023.  OBJECTIVE:  She appears alert, well appearing, in no apparent distress Urine dipstick: positive for urobilinogen.  ASSESSMENT:  Vaginal Discharge  Vaginal Odor Dysuria    PLAN:  GC, chlamydia, trichomonas, BVAG, CVAG probe and urine culture sent to lab. Treatment: To be determined once lab results are received ROV prn if symptoms persist or worsen.

## 2024-01-23 ENCOUNTER — Ambulatory Visit: Admitting: Psychology

## 2024-01-23 ENCOUNTER — Encounter: Payer: Self-pay | Admitting: Obstetrics and Gynecology

## 2024-01-23 LAB — CERVICOVAGINAL ANCILLARY ONLY
Bacterial Vaginitis (gardnerella): POSITIVE — AB
Candida Glabrata: NEGATIVE
Candida Vaginitis: NEGATIVE
Chlamydia: NEGATIVE
Comment: NEGATIVE
Comment: NEGATIVE
Comment: NEGATIVE
Comment: NEGATIVE
Comment: NEGATIVE
Comment: NORMAL
Neisseria Gonorrhea: NEGATIVE
Trichomonas: NEGATIVE

## 2024-01-23 MED ORDER — METRONIDAZOLE 0.75 % VA GEL: 1.0000 | Freq: Every day | VAGINAL | 1 refills | Status: DC

## 2024-01-23 NOTE — Addendum Note (Signed)
 Addended by: Loralyn Rochester on: 01/23/2024 07:00 PM   Modules accepted: Orders

## 2024-01-24 LAB — URINE CULTURE

## 2024-01-25 ENCOUNTER — Encounter: Payer: Self-pay | Admitting: Obstetrics and Gynecology

## 2024-01-30 ENCOUNTER — Ambulatory Visit (INDEPENDENT_AMBULATORY_CARE_PROVIDER_SITE_OTHER): Admitting: Psychology

## 2024-01-30 DIAGNOSIS — F411 Generalized anxiety disorder: Secondary | ICD-10-CM | POA: Diagnosis not present

## 2024-01-30 DIAGNOSIS — F331 Major depressive disorder, recurrent, moderate: Secondary | ICD-10-CM | POA: Diagnosis not present

## 2024-01-30 NOTE — Progress Notes (Signed)
 [facility] Counselor/Therapist Progress Note  Patient ID: Jennifer Leach, MRN: [id number]    Date: 01/30/24  Time Spent: 10:00 a.m. - 10:53 a.m.  : 53 minutes  Treatment Type: Individual Therapy.  Reported Symptoms: "I have racing thoughts, my heart speeds up, my significant relationship ended after a long time, and I get anxiety sometimes." Not being able to stop worrying, trouble relaxing, little interest in doing tings, feeling tired and having little energy, trouble concentrating on anything.   Mental Status Exam:  Appearance:  Well Groomed     Behavior: Appropriate  Motor: Normal  Speech/Language:  Normal Rate  Affect: Appropriate  Mood: normal  Thought process: normal  Thought content:   WNL  Sensory/Perceptual disturbances:   WNL  Orientation: oriented to person, place, and time/date  Attention: Good  Concentration: Good  Memory: WNL  Fund of knowledge:  Good  Insight:   Good  Judgment:  Good  Impulse Control: Good   Risk Assessment: Danger to Self: No Self-injurious Behavior: No Danger to Others: No Duty to Warn:no Physical Aggression / Violence:No  Access to Firearms a concern: No  Gang Involvement:No   Subjective:   Jennifer Leach participated from car, via video, and consented to treatment. I discussed the limitations of evaluation and management by telemedicine and the availability of in person appointments. The patient expressed understanding and agreed to proceed.  Therapist participated from home office.  Jennifer Leach reviewed the events of the past week.   Patient reported feeling better than she had in the past. Patient has not spoken to the person who "caused me so much anxiety" for almost one month. Patient  is trying to be a positive role model to her daughter. Patient will put up a positive saying in her room and in her daughter's foom.   Interventions: Cognitive Behavioral Therapy  Diagnosis:   Depression 33.1 and Generalized  Anxiety Disorder 41.1     Psychiatric Treatment: Yes , via PCP  Treatment Plan:  Client Abilities/Strengths Jennifer Leach is motivated for change to better her life and the life of her family, patient expressed willingness to do what it takes to get her life in order, patient is interested in doing things differently in her life going forward.   Support System:  Mother, Jennifer Leach, Jennifer Leach-friend   Client Treatment Preferences OPT  Client Statement of Needs Jennifer Leach would like to increase her motivation, set goals, increase healthier social interactions, learn more about her symptoms so she can manage them more effectively, and engage in consistent self-care.  Treatment Level Weekly/Biweekly  Symptoms  Anxiety: Not being able to stop worrying, trouble relaxing (Status: maintained) Depression: lhaving little interest in doing things, feeling tired and having little energy, trouble concentrating on anything (Status: maintained)  Goals:   Jennifer Leach experiences symptoms of anxiety and depression.   Target Date: 12/05/24 Frequency: Weekly/Biweekly  Progress: 0 Modality: individual    Therapist will provide referrals for additional resources as appropriate.  Therapist will provide psycho-education regarding Jennifer Leach's diagnosis and corresponding treatment approaches and interventions. Jennifer Leach will support the patient's ability to achieve the goals identified. will employ CBT, BA, Problem-solving, Solution Focused, Mindfulness,  coping skills, & other evidenced-based practices will be used to promote progress towards healthy functioning to help manage decrease symptoms associated with their diagnosis.   Reduce overall level, frequency, and intensity of the feelings of depression, anxiety and panic evidenced by decreased overall symptoms from 6 to 7 days/week to 0 to 1 days/week per  client report for at least 3 consecutive months. Verbally express understanding of the relationship between  feelings of depression and anxiety and their impact on thinking patterns and behaviors. Verbalize an understanding of the role that distorted thinking plays in creating fears, excessive worry, and ruminations.  Jennifer Leach participated in the creation of the treatment plan)    Jennifer Leach

## 2024-02-06 ENCOUNTER — Ambulatory Visit (INDEPENDENT_AMBULATORY_CARE_PROVIDER_SITE_OTHER): Admitting: Psychology

## 2024-02-06 DIAGNOSIS — F411 Generalized anxiety disorder: Secondary | ICD-10-CM | POA: Diagnosis not present

## 2024-02-06 DIAGNOSIS — F331 Major depressive disorder, recurrent, moderate: Secondary | ICD-10-CM | POA: Diagnosis not present
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
## 2024-02-13 ENCOUNTER — Ambulatory Visit: Admitting: Psychology

## 2024-02-20 ENCOUNTER — Ambulatory Visit: Admitting: Psychology

## 2024-02-27 ENCOUNTER — Ambulatory Visit (INDEPENDENT_AMBULATORY_CARE_PROVIDER_SITE_OTHER): Admitting: Psychology

## 2024-02-27 DIAGNOSIS — F411 Generalized anxiety disorder: Secondary | ICD-10-CM

## 2024-02-27 DIAGNOSIS — F331 Major depressive disorder, recurrent, moderate: Secondary | ICD-10-CM | POA: Diagnosis not present
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
## 2024-03-05 ENCOUNTER — Ambulatory Visit: Admitting: Psychology

## 2024-03-10 ENCOUNTER — Encounter: Admitting: Psychology

## 2024-03-10 ENCOUNTER — Ambulatory Visit

## 2024-03-10 NOTE — Progress Notes (Deleted)
                Jennifer Leach

## 2024-03-12 ENCOUNTER — Ambulatory Visit: Admitting: Psychology

## 2024-03-15 ENCOUNTER — Other Ambulatory Visit: Payer: Self-pay | Admitting: Family Medicine

## 2024-03-15 DIAGNOSIS — K219 Gastro-esophageal reflux disease without esophagitis: Secondary | ICD-10-CM

## 2024-03-15 NOTE — Progress Notes (Signed)
 This encounter was created in error - please disregard.

## 2024-03-19 ENCOUNTER — Ambulatory Visit: Admitting: Psychology

## 2024-03-26 ENCOUNTER — Ambulatory Visit: Admitting: Psychology

## 2024-04-02 ENCOUNTER — Ambulatory Visit: Admitting: Psychology

## 2024-04-03 ENCOUNTER — Ambulatory Visit: Admitting: Obstetrics and Gynecology

## 2024-04-03 ENCOUNTER — Ambulatory Visit: Payer: Self-pay | Admitting: Obstetrics and Gynecology

## 2024-04-16 ENCOUNTER — Ambulatory Visit: Admitting: Psychology

## 2024-04-21 ENCOUNTER — Ambulatory Visit

## 2024-04-22 ENCOUNTER — Other Ambulatory Visit (HOSPITAL_COMMUNITY)
Admission: RE | Admit: 2024-04-22 | Discharge: 2024-04-22 | Disposition: A | Discharge status: Home / Self Care | Source: Ambulatory Visit | Attending: Obstetrics and Gynecology | Admitting: Obstetrics and Gynecology

## 2024-04-22 ENCOUNTER — Ambulatory Visit (INDEPENDENT_AMBULATORY_CARE_PROVIDER_SITE_OTHER)

## 2024-04-22 VITALS — BP 125/77 | HR 68

## 2024-04-22 DIAGNOSIS — N898 Other specified noninflammatory disorders of vagina: Secondary | ICD-10-CM | POA: Diagnosis present

## 2024-04-22 NOTE — Progress Notes (Signed)
 SUBJECTIVE:  43 y.o. female complains of white vaginal discharge for 3 day(s). Denies abnormal vaginal bleeding or significant pelvic pain or fever. No UTI symptoms. Denies history of known exposure to STD. Does report abdominal cramping.  No LMP recorded. (Menstrual status: Irregular Periods).  OBJECTIVE:  She appears well, afebrile. Urine dipstick: not done.  ASSESSMENT:  Vaginal Discharge  Vaginal Odor   PLAN:  GC, chlamydia, trichomonas, BVAG, CVAG probe sent to lab. Treatment: To be determined once lab results are received ROV prn if symptoms persist or worsen.

## 2024-04-23 ENCOUNTER — Other Ambulatory Visit: Payer: Self-pay

## 2024-04-23 ENCOUNTER — Ambulatory Visit: Admitting: Psychology

## 2024-04-23 ENCOUNTER — Ambulatory Visit: Payer: Self-pay | Admitting: Obstetrics and Gynecology

## 2024-04-23 LAB — CERVICOVAGINAL ANCILLARY ONLY
Bacterial Vaginitis (gardnerella): POSITIVE — AB
Candida Glabrata: NEGATIVE
Candida Vaginitis: NEGATIVE
Chlamydia: NEGATIVE
Comment: NEGATIVE
Comment: NEGATIVE
Comment: NEGATIVE
Comment: NEGATIVE
Comment: NEGATIVE
Comment: NORMAL
Neisseria Gonorrhea: NEGATIVE
Trichomonas: NEGATIVE

## 2024-04-23 MED ORDER — METRONIDAZOLE 0.75 % VA GEL: 1.0000 | Freq: Every day | VAGINAL | 0 refills | Status: DC

## 2024-04-23 MED ORDER — FLUCONAZOLE 150 MG PO TABS: 150.0000 mg | ORAL_TABLET | Freq: Once | ORAL | 0 refills | Status: AC

## 2024-04-30 ENCOUNTER — Ambulatory Visit: Admitting: Psychology

## 2024-05-01 ENCOUNTER — Telehealth: Payer: Self-pay

## 2024-05-01 MED ORDER — FLUCONAZOLE 150 MG PO TABS: 150.0000 mg | ORAL_TABLET | Freq: Once | ORAL | 0 refills | Status: AC

## 2024-05-01 NOTE — Telephone Encounter (Signed)
 Returned call and pt states that she completed metrogel  course and has been having white discharge with itching and irritation despite taking diflucan . Advised that another rx for diflucan  can be sent with approval, and pt has appt on 7/31 for recurrent bv, advised to keep appt.

## 2024-05-07 ENCOUNTER — Ambulatory Visit: Admitting: Psychology

## 2024-05-08 ENCOUNTER — Ambulatory Visit: Admitting: Family Medicine

## 2024-05-08 NOTE — Progress Notes (Deleted)
 History:  Ms. LAURALEE WATERS is a 43 y.o. [id number] who presents to clinic today for recurrent BV.   The following portions of the patient's history were reviewed and updated as appropriate: allergies, current medications, family history, past medical history, social history, past surgical history and problem list.  Review of Systems:  ROS    Objective:  Physical Exam There were no vitals taken for this visit. Physical Exam   Labs and Imaging No results found for this or any previous visit (from the past 24 hours).  No results found.   Assessment & Plan:  There are no diagnoses linked to this encounter.  Joesph DELENA Sear, PA

## 2024-05-14 ENCOUNTER — Ambulatory Visit: Admitting: Psychology

## 2024-05-19 ENCOUNTER — Ambulatory Visit: Admitting: Physician Assistant

## 2024-05-19 DIAGNOSIS — Z91199 Patient's noncompliance with other medical treatment and regimen due to unspecified reason: Secondary | ICD-10-CM

## 2024-05-19 NOTE — Progress Notes (Signed)
 Patient no-showed today's appointment.

## 2024-05-21 ENCOUNTER — Ambulatory Visit: Admitting: Psychology

## 2024-05-28 ENCOUNTER — Ambulatory Visit: Admitting: Psychology

## 2024-06-04 ENCOUNTER — Ambulatory Visit: Admitting: Psychology

## 2024-06-17 ENCOUNTER — Other Ambulatory Visit (HOSPITAL_COMMUNITY)
Admission: RE | Admit: 2024-06-17 | Discharge: 2024-06-17 | Disposition: A | Discharge status: Home / Self Care | Source: Ambulatory Visit | Attending: Obstetrics and Gynecology | Admitting: Obstetrics and Gynecology

## 2024-06-17 ENCOUNTER — Ambulatory Visit (INDEPENDENT_AMBULATORY_CARE_PROVIDER_SITE_OTHER)

## 2024-06-17 VITALS — BP 129/84 | HR 80

## 2024-06-17 DIAGNOSIS — N898 Other specified noninflammatory disorders of vagina: Secondary | ICD-10-CM

## 2024-06-17 MED ORDER — METRONIDAZOLE 500 MG PO TABS: 500.0000 mg | ORAL_TABLET | Freq: Two times a day (BID) | ORAL | 0 refills | Status: DC

## 2024-06-17 NOTE — Progress Notes (Signed)
 SUBJECTIVE:  43 y.o. female complains of Jennifer Leach and thick vaginal discharge for 4-5 week(s), that has been worsening for the past 3 days. She has tried boric acid suppositories that would relieve her symptoms, but they would soon return. Pt normally receives prescription for metrogel  but feel as if it is no longer working.  Denies abnormal vaginal bleeding or significant pelvic pain or fever. No UTI symptoms. Denies history of known exposure to STD.  No LMP recorded. (Menstrual status: Irregular Periods).  OBJECTIVE:  She appears well, afebrile. Urine dipstick: not done.  ASSESSMENT:  Vaginal Discharge  Vaginal Odor   PLAN:  GC, chlamydia, trichomonas, BVAG, CVAG probe sent to lab. Reviewed the use of unscented soaps and laundry detergent, as well as partner hygiene. Pt requesting flagyl .  Treatment: To be determined once lab results are received ROV prn if symptoms persist or worsen.

## 2024-06-18 LAB — CERVICOVAGINAL ANCILLARY ONLY
Bacterial Vaginitis (gardnerella): POSITIVE — AB
Candida Glabrata: NEGATIVE
Candida Vaginitis: NEGATIVE
Chlamydia: NEGATIVE
Comment: NEGATIVE
Comment: NEGATIVE
Comment: NEGATIVE
Comment: NEGATIVE
Comment: NEGATIVE
Comment: NORMAL
Neisseria Gonorrhea: NEGATIVE
Trichomonas: NEGATIVE

## 2024-06-19 ENCOUNTER — Other Ambulatory Visit: Payer: Self-pay

## 2024-06-19 ENCOUNTER — Ambulatory Visit: Payer: Self-pay | Admitting: Obstetrics and Gynecology

## 2024-06-19 ENCOUNTER — Other Ambulatory Visit: Payer: Self-pay | Admitting: Obstetrics and Gynecology

## 2024-06-19 MED ORDER — METRONIDAZOLE 0.75 % VA GEL
1.0000 | Freq: Every day | VAGINAL | 0 refills | Status: DC
Start: 2024-06-19 — End: 2024-07-06

## 2024-06-19 MED ORDER — FLUCONAZOLE 150 MG PO TABS
150.0000 mg | ORAL_TABLET | Freq: Once | ORAL | 0 refills | Status: AC
Start: 2024-06-19 — End: 2024-06-19

## 2024-06-19 MED ORDER — FC2 FEMALE CONDOM MISC: 6 refills | Status: AC

## 2024-07-06 ENCOUNTER — Ambulatory Visit (HOSPITAL_COMMUNITY)
Admission: EM | Admit: 2024-07-06 | Discharge: 2024-07-06 | Disposition: A | Discharge status: Home / Self Care | Attending: Emergency Medicine | Admitting: Emergency Medicine

## 2024-07-06 ENCOUNTER — Ambulatory Visit (INDEPENDENT_AMBULATORY_CARE_PROVIDER_SITE_OTHER)

## 2024-07-06 ENCOUNTER — Encounter (HOSPITAL_COMMUNITY): Payer: Self-pay

## 2024-07-06 DIAGNOSIS — B9789 Other viral agents as the cause of diseases classified elsewhere: Secondary | ICD-10-CM | POA: Diagnosis not present

## 2024-07-06 DIAGNOSIS — R0789 Other chest pain: Secondary | ICD-10-CM | POA: Diagnosis not present

## 2024-07-06 DIAGNOSIS — M545 Low back pain, unspecified: Secondary | ICD-10-CM

## 2024-07-06 DIAGNOSIS — J988 Other specified respiratory disorders: Secondary | ICD-10-CM | POA: Diagnosis not present

## 2024-07-06 DIAGNOSIS — R051 Acute cough: Secondary | ICD-10-CM

## 2024-07-06 LAB — POCT URINALYSIS DIP (MANUAL ENTRY)
Bilirubin, UA: NEGATIVE
Glucose, UA: NEGATIVE mg/dL
Leukocytes, UA: NEGATIVE
Nitrite, UA: NEGATIVE
Protein Ur, POC: NEGATIVE mg/dL
Spec Grav, UA: 1.02 (ref 1.010–1.025)
Urobilinogen, UA: >=8 U/dL — AB
pH, UA: 7.5 (ref 5.0–8.0)

## 2024-07-06 LAB — POC COVID19/FLU A&B COMBO
Covid Antigen, POC: NEGATIVE
Influenza A Antigen, POC: NEGATIVE
Influenza B Antigen, POC: NEGATIVE

## 2024-07-06 MED ORDER — BENZONATATE 100 MG PO CAPS: 100.0000 mg | ORAL_CAPSULE | Freq: Three times a day (TID) | ORAL | 0 refills | Status: DC

## 2024-07-06 MED ORDER — PROMETHAZINE-DM 6.25-15 MG/5ML PO SYRP: 5.0000 mL | ORAL_SOLUTION | Freq: Every evening | ORAL | 0 refills | Status: DC | PRN

## 2024-07-06 NOTE — ED Triage Notes (Signed)
 SOB, body aches, hot flashes, headaches, sore throat, and low back pain for the last 3 days. Also feeling light headed with a dry cough. No known sick exposure.   Took otc allergy meds and Dayquil with no relief.

## 2024-07-06 NOTE — Discharge Instructions (Addendum)
 Your chest x-ray is negative for any underlying pneumonia.  Your COVID and flu testing were both negative today. Your urinalysis does not reveal any signs of infection or concerns for your kidneys today. I believe your symptoms are likely related to a viral respiratory illness which will pass on its own. You can take Tessalon  every 8 hours as needed for cough. You can take Promethazine  DM cough syrup at bedtime as needed for cough.  This can make you drowsy so do not drive, work, or drink alcohol while taking this. Alternate between 650 mg of Tylenol  and 400 to 600 mg of ibuprofen  as needed for body aches, back pain, sore throat, and any fever. Make sure you are staying hydrated and getting plenty of rest. Follow-up with your primary care provider or return here as needed.

## 2024-07-06 NOTE — ED Provider Notes (Signed)
 [facility]    CSN: [id number] Arrival date & time: 07/06/24  1536      History   Chief Complaint Chief Complaint  Patient presents with   Cough    HPI Jennifer Leach is a 43 y.o. female.   Patient presents with sore throat, cough, body aches, hot flashes, intermittent headaches, bilateral low back pain, and intermittent shortness of breath that began on 9/25.  Patient reports that she feels mildly short of breath when speaking in full sentences.  Patient states that when she lies down at night she also has some chest tightness.  Patient also reports feeling lightheaded at times when she coughs.  Patient denies chest tightness at this time or when she is up and moving throughout the day.  Patient denies any known sick contacts. Patient denies severe chest pain, nausea, vomiting, diarrhea, and known fever.  Patient reports that she has been taking over-the-counter allergy medication and DayQuil without relief.  Patient denies any history of asthma or COPD.  Patient is concerned that her low back pain may be related to her kidneys and would like her urine checked today.  Patient denies any dysuria, hematuria, urinary frequency/urgency, or abdominal pain.  The history is provided by the patient and medical records.  Cough   Past Medical History:  Diagnosis Date   Bacterial vaginitis 03/04/2017   Gonorrhea    Labial swelling 03/04/2017   MRSA (methicillin resistant staph aureus) culture positive 08/06/2014    Patient Active Problem List   Diagnosis Date Noted   Essential (primary) hypertension 10/15/2023   Gastroesophageal reflux disease 10/15/2023   Iron  deficiency anemia due to chronic blood loss 08/27/2023   Routine general medical examination at a health care facility 08/27/2023   Chronic bilateral low back pain 02/05/2023   GAD (generalized anxiety disorder) 09/25/2022   Insomnia 09/25/2022   Depression, major, recurrent, moderate (HCC) 09/25/2022    Hidradenitis 04/14/2020   DUB (dysfunctional uterine bleeding) 11/19/2018   H/O tubal ligation 03/15/2017   Bacterial vaginitis 03/04/2017   Labial swelling 03/04/2017    Past Surgical History:  Procedure Laterality Date   TUBAL LIGATION N/A 02/19/2017   Procedure: POST PARTUM TUBAL LIGATION;  Surgeon: Lorence Ozell LITTIE, MD;  Location: [facility];  Service: Gynecology;  Laterality: N/A;   WISDOM TOOTH EXTRACTION      OB History     Gravida  4   Para  4   Term  4   Preterm      AB  0   Living  4      SAB      IAB  0   Ectopic      Multiple  0   Live Births  4            Home Medications    Prior to Admission medications   Medication Sig Start Date End Date Taking? Authorizing Provider  benzonatate  (TESSALON ) 100 MG capsule Take 1 capsule (100 mg total) by mouth every 8 (eight) hours. 07/06/24  Yes Dierdra Salameh A, NP  Iron , Ferrous Sulfate , 325 (65 Fe) MG TABS Take 325 mg by mouth every other day. 08/28/23  Yes [place], Betty G, MD  promethazine -dextromethorphan (PROMETHAZINE -DM) 6.25-15 MG/5ML syrup Take 5 mLs by mouth at bedtime as needed for cough. 07/06/24  Yes Johnie, Clarion Mooneyhan A, NP  amLODipine  (NORVASC ) 2.5 MG tablet Take 1 tablet (2.5 mg total) by mouth at bedtime. Patient not taking: Reported on 01/03/2024 11/19/23  [place], Betty G, MD  Condoms - Female (FC2 FEMALE CONDOM) MISC Insert in the vagina up to 20 minutes prior to intercourse or as needed 06/19/24   Constant, Peggy, MD  omeprazole  (PRILOSEC) 40 MG capsule TAKE 1 CAPSULE (40 MG TOTAL) BY MOUTH DAILY. 03/17/24 06/17/24  [place], Betty G, MD    Family History Family History  Problem Relation Age of Onset   Hypertension Mother     Social History Social History   Tobacco Use   Smoking status: Every Day    Current packs/day: 0.00    Average packs/day: 0.3 packs/day for 9.0 years (2.3 ttl pk-yrs)    Types: Cigarettes    Start date: 06/10/2003    Last attempt to quit: 06/09/2012    Years  since quitting: 12.0   Smokeless tobacco: Never   Tobacco comments:    pt reports is is smoking   Vaping Use   Vaping status: Never Used  Substance Use Topics   Alcohol use: No   Drug use: No     Allergies   Septra ds [sulfamethoxazole -trimethoprim]   Review of Systems Review of Systems  Respiratory:  Positive for cough.    Per HPI  Physical Exam Triage Vital Signs ED Triage Vitals  Encounter Vitals Group     BP 07/06/24 1653 121/80     Girls Systolic BP Percentile --      Girls Diastolic BP Percentile --      Boys Systolic BP Percentile --      Boys Diastolic BP Percentile --      Pulse Rate 07/06/24 1653 85     Resp 07/06/24 1653 18     Temp 07/06/24 1653 98.8 F (37.1 C)     Temp Source 07/06/24 1653 Oral     SpO2 07/06/24 1653 97 %     Weight --      Height --      Head Circumference --      Peak Flow --      Pain Score 07/06/24 1651 8     Pain Loc --      Pain Education --      Exclude from Growth Chart --    No data found.  Updated Vital Signs BP 121/80 (BP Location: Right Arm)   Pulse 85   Temp 98.8 F (37.1 C) (Oral)   Resp 18   LMP 06/29/2024 (Approximate)   SpO2 97%   Visual Acuity Right Eye Distance:   Left Eye Distance:   Bilateral Distance:    Right Eye Near:   Left Eye Near:    Bilateral Near:     Physical Exam Vitals and nursing note reviewed.  Constitutional:      General: She is awake. She is not in acute distress.    Appearance: Normal appearance. She is well-developed and well-groomed. She is not ill-appearing.  HENT:     Right Ear: Tympanic membrane, ear canal and external ear normal.     Left Ear: Tympanic membrane, ear canal and external ear normal.     Nose: Congestion and rhinorrhea present.     Mouth/Throat:     Mouth: Mucous membranes are moist.     Pharynx: Posterior oropharyngeal erythema present. No oropharyngeal exudate.  Cardiovascular:     Rate and Rhythm: Normal rate and regular rhythm.  Pulmonary:      Effort: Pulmonary effort is normal.     Breath sounds: Normal breath sounds.  Abdominal:     General: Abdomen  is flat. Bowel sounds are normal. There is no distension.     Palpations: Abdomen is soft.     Tenderness: There is no abdominal tenderness. There is no right CVA tenderness, left CVA tenderness or guarding.  Musculoskeletal:     Cervical back: Normal.     Thoracic back: Normal.     Lumbar back: Tenderness present. No swelling, edema, deformity, signs of trauma or bony tenderness. Normal range of motion. Negative right straight leg raise test and negative left straight leg raise test.       Back:     Comments: Tenderness noted to bilateral low back without spinous process tenderness or CVA tenderness.  Skin:    General: Skin is warm and dry.  Neurological:     Mental Status: She is alert.  Psychiatric:        Behavior: Behavior is cooperative.      UC Treatments / Results  Labs (all labs ordered are listed, but only abnormal results are displayed) Labs Reviewed  POCT URINALYSIS DIP (MANUAL ENTRY) - Abnormal; Notable for the following components:      Result Value   Clarity, UA turbid (*)    Ketones, POC UA trace (5) (*)    Blood, UA moderate (*)    Urobilinogen, UA >=8.0 (*)    All other components within normal limits  POC COVID19/FLU A&B COMBO    EKG   Radiology DG Chest 2 View Result Date: 07/06/2024 CLINICAL DATA:  Cough chest tightness EXAM: CHEST - 2 VIEW COMPARISON:  01/04/2024 FINDINGS: The heart size and mediastinal contours are within normal limits. Both lungs are clear. The visualized skeletal structures are unremarkable. IMPRESSION: No active cardiopulmonary disease. Electronically Signed   By: Luke Bun M.D.   On: 07/06/2024 17:56    Procedures Procedures (including critical care time)  Medications Ordered in UC Medications - No data to display  Initial Impression / Assessment and Plan / UC Course  I have reviewed the triage vital signs and  the nursing notes.  Pertinent labs & imaging results that were available during my care of the patient were reviewed by me and considered in my medical decision making (see chart for details).     Patient is overall well-appearing.  Vitals are stable.  Chest x-ray ordered due to complaints of chest tightness and shortness of breath with symptoms.  Based on my interpretation there is no active cardiopulmonary disease.  Radiology report confirms this.  COVID and flu testing both negative.  Symptoms likely viral in nature.  Urinalysis unremarkable back pain likely related to viral illness as well.   Prescribed Tessalon  and Promethazine  DM as needed for cough.  Discussed over-the-counter medications as needed for pain.  Discussed follow-up and return precautions. Final Clinical Impressions(s) / UC Diagnoses   Final diagnoses:  Acute cough  Chest tightness  Acute bilateral low back pain without sciatica  Viral respiratory illness     Discharge Instructions      Your chest x-ray is negative for any underlying pneumonia.  Your COVID and flu testing were both negative today. Your urinalysis does not reveal any signs of infection or concerns for your kidneys today. I believe your symptoms are likely related to a viral respiratory illness which will pass on its own. You can take Tessalon  every 8 hours as needed for cough. You can take Promethazine  DM cough syrup at bedtime as needed for cough.  This can make you drowsy so do not drive, work, or drink  alcohol while taking this. Alternate between 650 mg of Tylenol  and 400 to 600 mg of ibuprofen  as needed for body aches, back pain, sore throat, and any fever. Make sure you are staying hydrated and getting plenty of rest. Follow-up with your primary care provider or return here as needed.      ED Prescriptions     Medication Sig Dispense Auth. Provider   benzonatate  (TESSALON ) 100 MG capsule Take 1 capsule (100 mg total) by mouth every 8  (eight) hours. 21 capsule Johnie Flaming A, NP   promethazine -dextromethorphan (PROMETHAZINE -DM) 6.25-15 MG/5ML syrup Take 5 mLs by mouth at bedtime as needed for cough. 118 mL Johnie Flaming A, NP      PDMP not reviewed this encounter.   Johnie Flaming A, NP 07/06/24 [phone]

## 2024-07-07 ENCOUNTER — Ambulatory Visit: Admitting: Family Medicine

## 2024-07-07 ENCOUNTER — Ambulatory Visit (HOSPITAL_COMMUNITY): Payer: Self-pay

## 2024-07-07 ENCOUNTER — Ambulatory Visit: Payer: Self-pay

## 2024-07-07 NOTE — Telephone Encounter (Signed)
 FYI Only or Action Required?: FYI only for provider.  Patient was last seen in primary care on 10/15/2023 by [place], Betty G, MD.  Called Nurse Triage reporting Generalized Body Aches, Cough, and Sinusitis.  Symptoms began several days ago.  Interventions attempted: OTC medications: allergy pill and pain pill .  Symptoms are: body aches, sneezing, headache, facial pain to eyes and ears, sore throat, cough, lightheaded, nausea gradually worsening.  Triage Disposition: See Physician Within 24 Hours- scheduled patient with PCP, she then states she called the wrong office and cancelled and does not wish to see her PCP and will call her other doctor  Patient/caregiver understands and will follow disposition?: Yes         Copied from CRM #[id number]. Topic: Clinical - Red Word Triage >> Jul 07, 2024  8:00 AM Carlyon D wrote: Red Word that prompted transfer to Nurse Triage: Light headed, body hurts, throat hurts, congested, mild/severe  headaches. Reason for Disposition  Earache  Answer Assessment - Initial Assessment Questions 1. SYMPTOMS: What is your main symptom or concern? (e.g., cough, fever, shortness of breath, muscle aches)     Sneezing, cough, body aches, facial pain (eyes and ears), nasal congestion (not sure of color of mucus).  2. ONSET: When did the symptoms start?      07/03/24.  3. COUGH: Do you have a cough? If Yes, ask: How bad is the cough?       Yes. Mild/occasional, dry cough.  4. FEVER: Do you have a fever? If Yes, ask: What is your temperature, how was it measured, and when did it start?     No.  5. BREATHING DIFFICULTY: Are you having any difficulty breathing? (e.g., normal; shortness of breath, wheezing, unable to speak)      Normal.  6. BETTER-SAME-WORSE: Are you getting better, staying the same or getting worse compared to yesterday?  If getting worse, ask, In what way?     The same but she states when she lies down she doesn't get  SOB.  7. OTHER SYMPTOMS: Do you have any other symptoms?  (e.g., chills, fatigue, headache, loss of smell or taste, muscle pain, sore throat)     Nausea, lightheaded when up and walking, sore throat, headache.  8. INFLUENZA EXPOSURE: Was there any known exposure to influenza (flu) before the symptoms began?      No exposure to flu or COVID.  9. INFLUENZA SUSPECTED: Why do you think you have influenza? (e.g., positive flu self-test at home, symptoms after exposure).     Unsure.  10. INFLUENZA VACCINE: Have you had the flu vaccine? If Yes, ask: When did you last get it?       No.  11. HIGH RISK FOR COMPLICATIONS: Do you have any chronic medical problems? (e.g., asthma, heart or lung disease, obesity, weak immune system)       No.  12. PREGNANCY: Is there any chance you are pregnant? When was your last menstrual period?       LMP was 3 days ago.  13. O2 SATURATION MONITOR:  Do you use an oxygen saturation monitor (pulse oximeter) at home? If Yes, ask What is your reading (oxygen level) today? What is your usual oxygen saturation reading? (e.g., 95%)       97% yesterday at urgent care.  COVID and flu test negative yesterday at urgent care.  Protocols used: Influenza (Flu) Suspected-A-AH

## 2024-07-22 ENCOUNTER — Ambulatory Visit (HOSPITAL_COMMUNITY): Payer: Self-pay

## 2024-08-04 ENCOUNTER — Other Ambulatory Visit: Payer: Self-pay | Admitting: Physician Assistant

## 2024-08-04 DIAGNOSIS — Z1231 Encounter for screening mammogram for malignant neoplasm of breast: Secondary | ICD-10-CM

## 2024-08-04 DIAGNOSIS — N921 Excessive and frequent menstruation with irregular cycle: Secondary | ICD-10-CM

## 2024-08-26 ENCOUNTER — Ambulatory Visit: Admitting: Obstetrics and Gynecology

## 2024-08-26 VITALS — BP 134/84 | HR 83 | Wt 181.0 lb

## 2024-08-26 DIAGNOSIS — Z30432 Encounter for removal of intrauterine contraceptive device: Secondary | ICD-10-CM

## 2024-08-26 MED ORDER — NORETHINDRONE ACETATE 5 MG PO TABS: 5.0000 mg | ORAL_TABLET | Freq: Every day | ORAL | 3 refills | Status: AC

## 2024-08-26 NOTE — Progress Notes (Signed)
    GYNECOLOGY OFFICE PROCEDURE NOTE  Jennifer Leach is a 43 y.o. [phone] here for IUD removal. Was placed for AUB (s/p BTL), but symptoms are not controlled adequately. She still has cramping and IUD has been in place for about 1 year. She'd like to go back on the aygestin  and is considering hysterectomy next year  IUD Removal  Patient identified, informed consent performed, consent signed.  Patient was in the dorsal lithotomy position, normal external genitalia was noted.  A speculum was placed in the patient's vagina, normal discharge was noted, no lesions. The cervix was visualized, no lesions, no abnormal discharge.  The strings of the IUD were grasped and pulled using Kelly forceps. The IUD was removed in its entirety.   Patient tolerated the procedure well.    Routine preventative health maintenance measures emphasized.  Rx for aygestin  5mg  daily sent. Discussed upcoming leave, would need to schedule hyst in Feb or July(ish). She would like to plan for summertime. Plan for hyst consult in March  Kieth Carolin, MD Obstetrician & Gynecologist, [facility], [facility]

## 2024-08-26 NOTE — Progress Notes (Signed)
 Pt is in office for IUD removal, pt has been having very irregular cycles.  Pt also complains of lots of cramping.  Pt has been on Lysteda  in the past and would like to discuss.

## 2024-09-02 ENCOUNTER — Ambulatory Visit

## 2024-09-09 ENCOUNTER — Ambulatory Visit (HOSPITAL_COMMUNITY)
Admission: RE | Admit: 2024-09-09 | Discharge: 2024-09-09 | Disposition: A | Discharge status: Home / Self Care | Source: Ambulatory Visit | Attending: Family Medicine | Admitting: Family Medicine

## 2024-09-09 ENCOUNTER — Encounter (HOSPITAL_COMMUNITY): Payer: Self-pay

## 2024-09-09 VITALS — BP 136/94 | HR 84 | Temp 97.8°F | Resp 16

## 2024-09-09 DIAGNOSIS — R102 Pelvic and perineal pain unspecified side: Secondary | ICD-10-CM | POA: Insufficient documentation

## 2024-09-09 DIAGNOSIS — Z202 Contact with and (suspected) exposure to infections with a predominantly sexual mode of transmission: Secondary | ICD-10-CM | POA: Insufficient documentation

## 2024-09-09 LAB — POCT URINE DIPSTICK
Glucose, UA: NEGATIVE mg/dL
Nitrite, UA: NEGATIVE
POC PROTEIN,UA: 30 — AB
Spec Grav, UA: 1.025 (ref 1.010–1.025)
Urobilinogen, UA: 1 U/dL
pH, UA: 6 (ref 5.0–8.0)

## 2024-09-09 LAB — HIV ANTIBODY (ROUTINE TESTING W REFLEX): HIV Screen 4th Generation wRfx: NONREACTIVE

## 2024-09-09 LAB — POCT URINE PREGNANCY: Preg Test, Ur: NEGATIVE

## 2024-09-09 MED ORDER — DOXYCYCLINE HYCLATE 100 MG PO CAPS: 100.0000 mg | ORAL_CAPSULE | Freq: Two times a day (BID) | ORAL | 0 refills | Status: AC

## 2024-09-09 MED ORDER — CEFTRIAXONE SODIUM 500 MG IJ SOLR
500.0000 mg | INTRAMUSCULAR | Status: DC
  Administered 2024-09-09: 500 mg via INTRAMUSCULAR

## 2024-09-09 MED ORDER — LIDOCAINE HCL (PF) 1 % IJ SOLN
INTRAMUSCULAR | Status: AC
  Filled 2024-09-09: qty 2

## 2024-09-09 MED ORDER — CEFTRIAXONE SODIUM 500 MG IJ SOLR
INTRAMUSCULAR | Status: AC
  Filled 2024-09-09: qty 500

## 2024-09-09 MED ORDER — KETOROLAC TROMETHAMINE 10 MG PO TABS: 10.0000 mg | ORAL_TABLET | Freq: Four times a day (QID) | ORAL | 0 refills | Status: AC | PRN

## 2024-09-09 MED ORDER — KETOROLAC TROMETHAMINE 30 MG/ML IJ SOLN
INTRAMUSCULAR | Status: AC
  Filled 2024-09-09: qty 1

## 2024-09-09 MED ORDER — METRONIDAZOLE 500 MG PO TABS: 500.0000 mg | ORAL_TABLET | Freq: Two times a day (BID) | ORAL | 0 refills | Status: AC

## 2024-09-09 MED ORDER — KETOROLAC TROMETHAMINE 30 MG/ML IJ SOLN
30.0000 mg | Freq: Once | INTRAMUSCULAR | Status: AC
  Administered 2024-09-09: 30 mg via INTRAMUSCULAR

## 2024-09-09 NOTE — ED Triage Notes (Addendum)
 Pt reports abdominal pain x 4 days. States the pain began several days after getting an IUD removed. Recently felt like she had BV 1/2 week ago, was not confirmed with testing but did receive medication from PCP. Also add she has been having ongoing abnormal uterine bleeding for 8 years and currently undergoing a full workup with the OBGYN provider.  Requesting STD testing, urine testing.

## 2024-09-09 NOTE — ED Provider Notes (Signed)
 [facility]    CSN: [id number] Arrival date & time: 09/09/24  1851      History   Chief Complaint Chief Complaint  Patient presents with   Abdominal Pain    HPI Jennifer Leach is a 43 y.o. female.    Abdominal Pain Here for pelvic pain has been going on for about 4 days.  No fever or nausea or vomiting.  No dysuria.  She has had abnormal uterine bleeding for a long time.  She just had her IUD removed on November 18.  She is set to have a transvaginal ultrasound tomorrow.  She is allergic to sulfa  Past Medical History:  Diagnosis Date   Bacterial vaginitis 03/04/2017   Gonorrhea    Labial swelling 03/04/2017   MRSA (methicillin resistant staph aureus) culture positive 08/06/2014    Patient Active Problem List   Diagnosis Date Noted   Essential (primary) hypertension 10/15/2023   Gastroesophageal reflux disease 10/15/2023   Iron  deficiency anemia due to chronic blood loss 08/27/2023   Routine general medical examination at a health care facility 08/27/2023   Chronic bilateral low back pain 02/05/2023   GAD (generalized anxiety disorder) 09/25/2022   Insomnia 09/25/2022   Depression, major, recurrent, moderate (HCC) 09/25/2022   Hidradenitis 04/14/2020   DUB (dysfunctional uterine bleeding) 11/19/2018   H/O tubal ligation 03/15/2017   Bacterial vaginitis 03/04/2017   Labial swelling 03/04/2017    Past Surgical History:  Procedure Laterality Date   TUBAL LIGATION N/A 02/19/2017   Procedure: POST PARTUM TUBAL LIGATION;  Surgeon: Lorence Ozell LITTIE, MD;  Location: [facility];  Service: Gynecology;  Laterality: N/A;   WISDOM TOOTH EXTRACTION      OB History     Gravida  4   Para  4   Term  4   Preterm      AB  0   Living  4      SAB      IAB  0   Ectopic      Multiple  0   Live Births  4            Home Medications    Prior to Admission medications   Medication Sig Start Date End Date Taking? Authorizing  Provider  doxycycline  (VIBRAMYCIN ) 100 MG capsule Take 1 capsule (100 mg total) by mouth 2 (two) times daily for 7 days. 09/09/24 09/16/24 Yes Vonna Sharlet POUR, MD  ketorolac  (TORADOL ) 10 MG tablet Take 1 tablet (10 mg total) by mouth every 6 (six) hours as needed (pain). 09/09/24  Yes Vonna Sharlet POUR, MD  metroNIDAZOLE  (FLAGYL ) 500 MG tablet Take 1 tablet (500 mg total) by mouth 2 (two) times daily for 7 days. 09/09/24 09/16/24 Yes Vonna Sharlet POUR, MD  amLODipine  (NORVASC ) 2.5 MG tablet Take 1 tablet (2.5 mg total) by mouth at bedtime. 11/19/23   Jordan, Betty G, MD  Condoms - Female (FC2 FEMALE CONDOM) MISC Insert in the vagina up to 20 minutes prior to intercourse or as needed 06/19/24   Constant, Peggy, MD  Iron , Ferrous Sulfate , 325 (65 Fe) MG TABS Take 325 mg by mouth every other day. 08/28/23   Jordan, Betty G, MD  norethindrone  (AYGESTIN ) 5 MG tablet Take 1 tablet (5 mg total) by mouth daily. 08/26/24   Erik Kieth BROCKS, MD  omeprazole  (PRILOSEC) 40 MG capsule TAKE 1 CAPSULE (40 MG TOTAL) BY MOUTH DAILY. 03/17/24 06/17/24  Jordan, Betty G, MD    Family History Family  History  Problem Relation Age of Onset   Hypertension Mother     Social History Social History   Tobacco Use   Smoking status: Every Day    Current packs/day: 0.00    Average packs/day: 0.3 packs/day for 9.0 years (2.3 ttl pk-yrs)    Types: Cigarettes    Start date: 06/10/2003    Last attempt to quit: 06/09/2012    Years since quitting: 12.2   Smokeless tobacco: Never   Tobacco comments:    pt reports is is smoking   Vaping Use   Vaping status: Never Used  Substance Use Topics   Alcohol use: No   Drug use: No     Allergies   Septra ds [sulfamethoxazole -trimethoprim]   Review of Systems Review of Systems  Gastrointestinal:  Positive for abdominal pain.     Physical Exam Triage Vital Signs ED Triage Vitals [09/09/24 1907]  Encounter Vitals Group     BP (!) 136/94     Girls Systolic BP Percentile       Girls Diastolic BP Percentile      Boys Systolic BP Percentile      Boys Diastolic BP Percentile      Pulse Rate 84     Resp 16     Temp 97.8 F (36.6 C)     Temp Source Oral     SpO2 98 %     Weight      Height      Head Circumference      Peak Flow      Pain Score      Pain Loc      Pain Education      Exclude from Growth Chart    No data found.  Updated Vital Signs BP (!) 136/94 (BP Location: Left Arm)   Pulse 84   Temp 97.8 F (36.6 C) (Oral)   Resp 16   LMP 08/11/2024 (Approximate)   SpO2 98%   Visual Acuity Right Eye Distance:   Left Eye Distance:   Bilateral Distance:    Right Eye Near:   Left Eye Near:    Bilateral Near:     Physical Exam Vitals reviewed.  Constitutional:      Appearance: She is not ill-appearing, toxic-appearing or diaphoretic.  HENT:     Mouth/Throat:     Mouth: Mucous membranes are moist.  Eyes:     Extraocular Movements: Extraocular movements intact.     Conjunctiva/sclera: Conjunctivae normal.     Pupils: Pupils are equal, round, and reactive to light.  Cardiovascular:     Rate and Rhythm: Normal rate and regular rhythm.     Heart sounds: No murmur heard. Pulmonary:     Effort: Pulmonary effort is normal.     Breath sounds: Normal breath sounds.  Abdominal:     Palpations: Abdomen is soft.     Comments: Suprapubic tenderness present  Genitourinary:    Comments: Chaperone present during the time of exam.  There is possibly some cervical motion tenderness.  No mass noted Musculoskeletal:     Cervical back: Neck supple.  Lymphadenopathy:     Cervical: No cervical adenopathy.  Skin:    Coloration: Skin is not pale.  Neurological:     General: No focal deficit present.     Mental Status: She is alert and oriented to person, place, and time.  Psychiatric:        Behavior: Behavior normal.      UC Treatments / Results  Labs (all labs ordered are listed, but only abnormal results are displayed) Labs Reviewed   POCT URINE DIPSTICK - Abnormal; Notable for the following components:      Result Value   Clarity, UA turbid (*)    Bilirubin, UA small (*)    Ketones, POC UA trace (5) (*)    Blood, UA large (*)    POC PROTEIN,UA =30 (*)    Leukocytes, UA Trace (*)    All other components within normal limits  SYPHILIS: RPR W/REFLEX TO RPR TITER AND TREPONEMAL ANTIBODIES, TRADITIONAL SCREENING AND DIAGNOSIS ALGORITHM  HIV ANTIBODY (ROUTINE TESTING W REFLEX)  POCT URINE PREGNANCY  CERVICOVAGINAL ANCILLARY ONLY    EKG   Radiology No results found.  Procedures Procedures (including critical care time)  Medications Ordered in UC Medications  cefTRIAXone  (ROCEPHIN ) injection 500 mg (has no administration in time range)  ketorolac  (TORADOL ) 30 MG/ML injection 30 mg (has no administration in time range)    Initial Impression / Assessment and Plan / UC Course  I have reviewed the triage vital signs and the nursing notes.  Pertinent labs & imaging results that were available during my care of the patient were reviewed by me and considered in my medical decision making (see chart for details).     UPT is negative Urinalysis is abnormal with blood, most likely due to her vaginal bleeding.  There is a trace of leukocytes.  Vaginal self swab is done, and we will notify of any positives on that and treat per protocol.  With her being tender on exam, I want to go ahead and treat empirically for possible gonorrhea or chlamydia.  Injection of Rocephin  is given here and injection of Toradol  was given for pain.  Toradol  tablets are sent to the pharmacy for pain along with doxycycline  for potential chlamydia.  She would like to also have Flagyl  sent in for possible BV  She will keep follow-up with her OB/GYN Final Clinical Impressions(s) / UC Diagnoses   Final diagnoses:  Pelvic pain  Potential exposure to STD     Discharge Instructions      Urine pregnancy test is negative  The urinalysis  shows mainly some red blood cells, but just a trace of white blood cells.  Urine culture is sent and staff will notify if it looks like anything antibiotic specifically for urine infection.  You have been given a shot of Toradol  30 mg today.  You have been given a shot of ceftriaxone  500 mg  Take doxycycline  100 mg --1 capsule 2 times daily for 7 days  Take metronidazole  500 mg--1 tablet 2 times daily for 7 days.  Avoid drinking alcohol within 72 hours of taking this medication  If you worsen in any way, please go to the emergency room for further evaluation  Please keep your follow-up appointments with your OB/GYN     ED Prescriptions     Medication Sig Dispense Auth. Provider   doxycycline  (VIBRAMYCIN ) 100 MG capsule Take 1 capsule (100 mg total) by mouth 2 (two) times daily for 7 days. 14 capsule Laasya Peyton K, MD   metroNIDAZOLE  (FLAGYL ) 500 MG tablet Take 1 tablet (500 mg total) by mouth 2 (two) times daily for 7 days. 14 tablet Paulo Keimig, Sharlet POUR, MD   ketorolac  (TORADOL ) 10 MG tablet Take 1 tablet (10 mg total) by mouth every 6 (six) hours as needed (pain). 20 tablet Lawarence Meek K, MD      PDMP not reviewed this encounter.  Vonna Sharlet POUR, MD 09/09/24 [phone]

## 2024-09-09 NOTE — Discharge Instructions (Addendum)
 Urine pregnancy test is negative  The urinalysis shows mainly some red blood cells, but just a trace of white blood cells.  Urine culture is sent and staff will notify if it looks like anything antibiotic specifically for urine infection.  You have been given a shot of Toradol  30 mg today.  You have been given a shot of ceftriaxone  500 mg  Take doxycycline  100 mg --1 capsule 2 times daily for 7 days  Take metronidazole  500 mg--1 tablet 2 times daily for 7 days.  Avoid drinking alcohol within 72 hours of taking this medication  If you worsen in any way, please go to the emergency room for further evaluation  Please keep your follow-up appointments with your OB/GYN

## 2024-09-10 ENCOUNTER — Ambulatory Visit
Admission: RE | Admit: 2024-09-10 | Discharge: 2024-09-10 | Disposition: A | Source: Ambulatory Visit | Attending: Physician Assistant | Admitting: Physician Assistant

## 2024-09-10 DIAGNOSIS — N921 Excessive and frequent menstruation with irregular cycle: Secondary | ICD-10-CM

## 2024-09-10 LAB — CERVICOVAGINAL ANCILLARY ONLY
Bacterial Vaginitis (gardnerella): NEGATIVE
Candida Glabrata: NEGATIVE
Candida Vaginitis: NEGATIVE
Chlamydia: NEGATIVE
Comment: NEGATIVE
Comment: NEGATIVE
Comment: NEGATIVE
Comment: NEGATIVE
Comment: NEGATIVE
Comment: NORMAL
Neisseria Gonorrhea: NEGATIVE
Trichomonas: NEGATIVE

## 2024-09-10 LAB — SYPHILIS: RPR W/REFLEX TO RPR TITER AND TREPONEMAL ANTIBODIES, TRADITIONAL SCREENING AND DIAGNOSIS ALGORITHM: RPR Ser Ql: NONREACTIVE

## 2024-09-11 ENCOUNTER — Ambulatory Visit

## 2024-10-06 ENCOUNTER — Ambulatory Visit (HOSPITAL_COMMUNITY): Payer: Self-pay

## 2024-10-14 ENCOUNTER — Ambulatory Visit: Admitting: Obstetrics and Gynecology

## 2024-10-14 VITALS — BP 135/80 | HR 73 | Ht 67.0 in | Wt 179.0 lb

## 2024-10-14 DIAGNOSIS — N938 Other specified abnormal uterine and vaginal bleeding: Secondary | ICD-10-CM | POA: Diagnosis not present

## 2024-10-14 DIAGNOSIS — R102 Pelvic and perineal pain unspecified side: Secondary | ICD-10-CM

## 2024-10-14 DIAGNOSIS — Z9851 Tubal ligation status: Secondary | ICD-10-CM

## 2024-10-14 NOTE — Progress Notes (Signed)
" °  CC: pelvic pain Subjective:    Patient ID: Jennifer Leach, female    DOB: Sep 05, 1981, 44 y.o.   MRN: [id number]  HPI 44 yo G4P4 seen for discussion of relatively new pelvic pain.  Pt is concerned that her pain is linked to new finding of small fibroid.  Pt has had long term hx of AUB.  She has been worked up and has tried progesterone IUD which was just removed.  Per previous notes, pt is considering hysterectomy in the Spring/Summer.   Review of Systems     Objective:   Physical Exam Vitals:   10/14/24 1658  BP: 135/80  Pulse: 73   CLINICAL DATA:  Excessive and frequent menstruation   EXAM: TRANSABDOMINAL AND TRANSVAGINAL ULTRASOUND OF PELVIS   TECHNIQUE: Both transabdominal and transvaginal ultrasound examinations of the pelvis were performed. Transabdominal technique was performed for global imaging of the pelvis including uterus, ovaries, adnexal regions, and pelvic cul-de-sac. It was necessary to proceed with endovaginal exam following the transabdominal exam to visualize the uterus endometrium adnexa.   COMPARISON:  Ultrasound 01/21/2024   FINDINGS: Uterus   Measurements: 8.3 x 4.5 x 5 cm = volume: 97 mL. Small hypoechoic intramural mass within the anterior uterine fundus measuring 9 x 6 x 7 mm suggestive of a small fibroid.   Endometrium   Thickness: 5.1 mm.  No focal abnormality visualized.   Right ovary   Measurements: 3.6 x 2 x 4.3 cm = volume: 16.4 mL. Normal appearance/no adnexal mass.   Left ovary   Measurements: 3.1 x 1.4 x 3 cm = volume: 6.9 mL. Normal appearance/no adnexal mass.   Other findings   Trace free fluid   IMPRESSION: Small uterine fibroid. Otherwise negative pelvic ultrasound      Assessment & Plan:   1. DUB (dysfunctional uterine bleeding) (Primary) Pt has discontinued aygestin  at this time.   Per previous plan pt needs hyst consult late February or early March to arrange for procedure.  2. H/O tubal ligation   3.  Pelvic pain in female I do not believe pain is due to fibroid due to its small size. With history of irregular menses and now pain, possibly consider endometriosis/adenomyosis, hyst would still be definitive therapy.   I spent 20 minutes dedicated to the care of this patient including previsit review of records, face to face time with the patient discussing ultrasound findings and post visit testing.     Jerilynn DELENA Buddle, MD Faculty Attending, [facility] [employer]  "

## 2024-10-14 NOTE — Progress Notes (Signed)
 Pt reports she is here due to lower abdomen pain. Reports pain started when I found out I had fibroids. Reports PCP recommended f/u with GYN. Irregular cycles w/ spotting in between. Denies concern about bleeding at present.

## 2024-10-20 ENCOUNTER — Ambulatory Visit: Payer: Self-pay | Admitting: Obstetrics and Gynecology

## 2024-10-22 ENCOUNTER — Ambulatory Visit: Payer: Self-pay

## 2024-11-04 ENCOUNTER — Other Ambulatory Visit: Payer: Self-pay

## 2024-11-04 ENCOUNTER — Ambulatory Visit (HOSPITAL_COMMUNITY)
Admission: EM | Admit: 2024-11-04 | Discharge: 2024-11-04 | Disposition: A | Discharge status: Home / Self Care | Attending: Family Medicine | Admitting: Family Medicine

## 2024-11-04 ENCOUNTER — Encounter (HOSPITAL_COMMUNITY): Payer: Self-pay | Admitting: Emergency Medicine

## 2024-11-04 DIAGNOSIS — S143XXA Injury of brachial plexus, initial encounter: Secondary | ICD-10-CM

## 2024-11-04 MED ORDER — TIZANIDINE HCL 4 MG PO TABS: 4.0000 mg | ORAL_TABLET | Freq: Three times a day (TID) | ORAL | 0 refills | Status: AC | PRN

## 2024-11-04 MED ORDER — PREDNISONE 20 MG PO TABS: 40.0000 mg | ORAL_TABLET | Freq: Every day | ORAL | 0 refills | Status: AC

## 2024-11-04 NOTE — ED Provider Notes (Signed)
 " [facility]    CSN: [id number] Arrival date & time: 11/04/24  1411      History   Chief Complaint Chief Complaint  Patient presents with   Arm Pain    HPI ADALIN VANDERPLOEG is a 44 y.o. female.   The patient presents with severe right arm pain and numbness.  Right arm pain - Severe throbbing pain with intermittent sharp, shooting pain radiating from the shoulder to the fingers - Burning and tingling sensations described as stabbing - Pain intensity fluctuates between 5 and 10 out of 10 throughout the day - Pain interrupts sleep, requiring them to wake, move the arm, or apply Icy Hot - Limited relief with Tylenol  and Icy Hot - No prior similar episodes  Right arm numbness and tingling - Numbness and tingling began with onset of arm pain - Involves all fingers and the entire hand - Stiffness and numbness improve somewhat with moving or shaking the arm  Neck stiffness and lump - Symptoms began with neck stiffness and a small lump in the neck approximately one week ago - Neck tightness and soreness persist - Initial neck pain attributed to sleeping awkwardly  Associated symptoms and trauma - Symptoms spread from neck to shoulders and side before localizing to right arm - No recent falls or injuries  The history is provided by the patient. No language interpreter was used.    Past Medical History:  Diagnosis Date   Bacterial vaginitis 03/04/2017   Gonorrhea    Labial swelling 03/04/2017   MRSA (methicillin resistant staph aureus) culture positive 08/06/2014    Patient Active Problem List   Diagnosis Date Noted   Essential (primary) hypertension 10/15/2023   Gastroesophageal reflux disease 10/15/2023   Iron  deficiency anemia due to chronic blood loss 08/27/2023   Routine general medical examination at a health care facility 08/27/2023   Chronic bilateral low back pain 02/05/2023   GAD (generalized anxiety disorder) 09/25/2022   Insomnia 09/25/2022    Depression, major, recurrent, moderate (HCC) 09/25/2022   Hidradenitis 04/14/2020   DUB (dysfunctional uterine bleeding) 11/19/2018   H/O tubal ligation 03/15/2017   Bacterial vaginitis 03/04/2017   Labial swelling 03/04/2017    Past Surgical History:  Procedure Laterality Date   TUBAL LIGATION N/A 02/19/2017   Procedure: POST PARTUM TUBAL LIGATION;  Surgeon: Lorence Ozell LITTIE, MD;  Location: [facility];  Service: Gynecology;  Laterality: N/A;   WISDOM TOOTH EXTRACTION      OB History     Gravida  4   Para  4   Term  4   Preterm      AB  0   Living  4      SAB      IAB  0   Ectopic      Multiple  0   Live Births  4            Home Medications    Prior to Admission medications  Medication Sig Start Date End Date Taking? Authorizing Provider  predniSONE  (DELTASONE ) 20 MG tablet Take 2 tablets (40 mg total) by mouth daily with breakfast for 5 days. 11/04/24 11/09/24 Yes Alba Ozell, MD  tiZANidine  (ZANAFLEX ) 4 MG tablet Take 1 tablet (4 mg total) by mouth every 8 (eight) hours as needed for up to 5 days for muscle spasms. 11/04/24 11/09/24 Yes Alba Ozell, MD  amLODipine  (NORVASC ) 2.5 MG tablet Take 1 tablet (2.5 mg total) by mouth at bedtime. 11/19/23  Jordan, Betty G, MD  Condoms - Female (FC2 FEMALE CONDOM) MISC Insert in the vagina up to 20 minutes prior to intercourse or as needed Patient not taking: Reported on 10/14/2024 06/19/24   Constant, Peggy, MD  Iron , Ferrous Sulfate , 325 (65 Fe) MG TABS Take 325 mg by mouth every other day. 08/28/23   Jordan, Betty G, MD  ketorolac  (TORADOL ) 10 MG tablet Take 1 tablet (10 mg total) by mouth every 6 (six) hours as needed (pain). Patient not taking: Reported on 10/14/2024 09/09/24   Vonna Sharlet POUR, MD  norethindrone  (AYGESTIN ) 5 MG tablet Take 1 tablet (5 mg total) by mouth daily. Patient not taking: Reported on 10/14/2024 08/26/24   Erik Kieth BROCKS, MD  omeprazole  (PRILOSEC) 40 MG capsule TAKE 1  CAPSULE (40 MG TOTAL) BY MOUTH DAILY. Patient not taking: Reported on 10/14/2024 03/17/24 06/17/24  Jordan, Betty G, MD    Family History Family History  Problem Relation Age of Onset   Hypertension Mother     Social History Social History[1]   Allergies   Septra ds [sulfamethoxazole -trimethoprim]   Review of Systems Review of Systems   Physical Exam Triage Vital Signs ED Triage Vitals  Encounter Vitals Group     BP 11/04/24 1529 127/85     Girls Systolic BP Percentile --      Girls Diastolic BP Percentile --      Boys Systolic BP Percentile --      Boys Diastolic BP Percentile --      Pulse Rate 11/04/24 1529 82     Resp 11/04/24 1529 18     Temp 11/04/24 1529 (!) 97.5 F (36.4 C)     Temp Source 11/04/24 1529 Oral     SpO2 11/04/24 1529 97 %     Weight --      Height --      Head Circumference --      Peak Flow --      Pain Score 11/04/24 1527 10     Pain Loc --      Pain Education --      Exclude from Growth Chart --    No data found.  Updated Vital Signs BP 127/85 (BP Location: Left Arm)   Pulse 82   Temp (!) 97.5 F (36.4 C) (Oral)   Resp 18   LMP 08/28/2024 (Approximate)   SpO2 97%   Visual Acuity Right Eye Distance:   Left Eye Distance:   Bilateral Distance:    Right Eye Near:   Left Eye Near:    Bilateral Near:     Physical Exam Vitals and nursing note reviewed.  Constitutional:      General: She is not in acute distress.    Appearance: She is well-developed.  HENT:     Head: Normocephalic and atraumatic.  Eyes:     Conjunctiva/sclera: Conjunctivae normal.  Neck:      Comments: Tight, tender trapezius muscle right side Cardiovascular:     Rate and Rhythm: Normal rate and regular rhythm.     Heart sounds: No murmur heard. Pulmonary:     Effort: Pulmonary effort is normal. No respiratory distress.     Breath sounds: Normal breath sounds.  Abdominal:     Palpations: Abdomen is soft.     Tenderness: There is no abdominal tenderness.   Musculoskeletal:        General: No swelling.     Cervical back: Neck supple. No rigidity. Pain with movement and muscular tenderness present.  No spinous process tenderness.     Comments: No obvious deformity, erythema Full range of motion shoulder flexion, abduction, internal and external rotation  Skin:    General: Skin is warm and dry.     Capillary Refill: Capillary refill takes less than 2 seconds.  Neurological:     Mental Status: She is alert.     Comments: Grip strength 5 out of 5 bilaterally upper extremities Biceps and triceps 5 out of 5 bilateral upper extremities Sensation intact bilateral upper extremities  Psychiatric:        Mood and Affect: Mood normal.      UC Treatments / Results  Labs (all labs ordered are listed, but only abnormal results are displayed) Labs Reviewed - No data to display  EKG   Radiology No results found.  Procedures Procedures (including critical care time)  Medications Ordered in UC Medications - No data to display  Initial Impression / Assessment and Plan / UC Course  I have reviewed the triage vital signs and the nursing notes.  Pertinent labs & imaging results that were available during my care of the patient were reviewed by me and considered in my medical decision making (see chart for details).     Suspect patient has acute brachial plexus injury secondary to trapezius muscle spasm.  Clinically this is similar to stinger/burner, in this case likely due to abnormal sleeping position.  Given age she may have some component of cervical radiculopathy; however, as she woke up with this 1 week ago and there is no prior symptoms similar to this would lean towards an acute injury.  Reassuringly she has intact sensation in the upper extremities as well as strength.  Will treat with steroid burst and muscle relaxant.  Provided with referral information for sports medicine for follow-up.  Provided with stretching exercises for  stinger.  Final Clinical Impressions(s) / UC Diagnoses   Final diagnoses:  Injury of brachial plexus, initial encounter     Discharge Instructions      You have pinched nerve in your right shoulder. This is causing the pain in your shoulder and arm as well as the numbness and tingling. I have sent a short course of steroids to your pharmacy.  This should help with the inflammation and help with your pain. I have also sent a muscle relaxant as this should help reduce the pinching of your nerves. You may additionally take Tylenol  and ibuprofen  as needed for additional pain relief. I have provided the phone number of a sports medicine clinic to follow-up with if this is not improving.     ED Prescriptions     Medication Sig Dispense Auth. Provider   predniSONE  (DELTASONE ) 20 MG tablet Take 2 tablets (40 mg total) by mouth daily with breakfast for 5 days. 10 tablet Timmey Lamba, MD   tiZANidine  (ZANAFLEX ) 4 MG tablet Take 1 tablet (4 mg total) by mouth every 8 (eight) hours as needed for up to 5 days for muscle spasms. 15 tablet Shandelle Borrelli, MD      PDMP not reviewed this encounter.     [1]  Social History Tobacco Use   Smoking status: Some Days    Current packs/day: 0.00    Average packs/day: 0.3 packs/day for 9.0 years (2.3 ttl pk-yrs)    Types: Cigarettes    Start date: 06/10/2003    Last attempt to quit: 06/09/2012    Years since quitting: 12.4   Smokeless tobacco: Never   Tobacco comments:  pt reports is is smoking   Vaping Use   Vaping status: Never Used  Substance Use Topics   Alcohol use: No   Drug use: No     Alba Sharper, MD 11/04/24 1609  "

## 2024-11-04 NOTE — ED Triage Notes (Signed)
 Last week had a knot in right side of neck.  Radiated to right shoulder.  Now feels pain, throbbing in right arm and numbness.  States knot is gone now.  Arm pain is constant, fluctuates in severity.  Patient is left handed.    Patient has had tylenol  and used icy hot cream on arm.

## 2024-11-04 NOTE — Discharge Instructions (Addendum)
 You have pinched nerve in your right shoulder. This is causing the pain in your shoulder and arm as well as the numbness and tingling. I have sent a short course of steroids to your pharmacy.  This should help with the inflammation and help with your pain. I have also sent a muscle relaxant as this should help reduce the pinching of your nerves. You may additionally take Tylenol  and ibuprofen  as needed for additional pain relief. I have provided the phone number of a sports medicine clinic to follow-up with if this is not improving.

## 2024-11-19 ENCOUNTER — Ambulatory Visit: Payer: Self-pay | Admitting: Obstetrics and Gynecology
